# Patient Record
Sex: Female | Born: 1960 | Race: Black or African American | Hispanic: No | Marital: Single | Smoking: Never smoker
Health system: Southern US, Community
[De-identification: ages and names within clinical notes are randomized; demographics above are authoritative.]

## PROBLEM LIST (undated history)

## (undated) DIAGNOSIS — F259 Schizoaffective disorder, unspecified: Secondary | ICD-10-CM

## (undated) DIAGNOSIS — R2689 Other abnormalities of gait and mobility: Secondary | ICD-10-CM

## (undated) DIAGNOSIS — R413 Other amnesia: Secondary | ICD-10-CM

## (undated) DIAGNOSIS — R2681 Unsteadiness on feet: Secondary | ICD-10-CM

## (undated) DIAGNOSIS — U071 COVID-19: Secondary | ICD-10-CM

## (undated) DIAGNOSIS — J45909 Unspecified asthma, uncomplicated: Secondary | ICD-10-CM

## (undated) DIAGNOSIS — N289 Disorder of kidney and ureter, unspecified: Secondary | ICD-10-CM

## (undated) DIAGNOSIS — E119 Type 2 diabetes mellitus without complications: Secondary | ICD-10-CM

## (undated) DIAGNOSIS — E43 Unspecified severe protein-calorie malnutrition: Secondary | ICD-10-CM

## (undated) DIAGNOSIS — I6990 Unspecified sequelae of unspecified cerebrovascular disease: Secondary | ICD-10-CM

## (undated) DIAGNOSIS — G9341 Metabolic encephalopathy: Secondary | ICD-10-CM

## (undated) DIAGNOSIS — R41841 Cognitive communication deficit: Secondary | ICD-10-CM

## (undated) DIAGNOSIS — F329 Major depressive disorder, single episode, unspecified: Secondary | ICD-10-CM

## (undated) DIAGNOSIS — M6281 Muscle weakness (generalized): Secondary | ICD-10-CM

## (undated) DIAGNOSIS — R1312 Dysphagia, oropharyngeal phase: Secondary | ICD-10-CM

## (undated) DIAGNOSIS — R569 Unspecified convulsions: Secondary | ICD-10-CM

## (undated) DIAGNOSIS — I10 Essential (primary) hypertension: Secondary | ICD-10-CM

---

## 1998-03-12 ENCOUNTER — Encounter: Admission: RE | Admit: 1998-03-12 | Discharge: 1998-03-12 | Payer: Self-pay | Admitting: Obstetrics & Gynecology

## 1998-03-12 ENCOUNTER — Other Ambulatory Visit: Admission: RE | Admit: 1998-03-12 | Discharge: 1998-03-12 | Payer: Self-pay | Admitting: Obstetrics & Gynecology

## 1998-05-20 ENCOUNTER — Ambulatory Visit (HOSPITAL_COMMUNITY): Admission: RE | Admit: 1998-05-20 | Discharge: 1998-05-20 | Payer: Self-pay | Admitting: Family Medicine

## 1999-05-31 ENCOUNTER — Encounter: Payer: Self-pay | Admitting: Family Medicine

## 1999-05-31 ENCOUNTER — Ambulatory Visit (HOSPITAL_COMMUNITY): Admission: RE | Admit: 1999-05-31 | Discharge: 1999-05-31 | Payer: Self-pay | Admitting: Family Medicine

## 1999-06-06 ENCOUNTER — Encounter: Admission: RE | Admit: 1999-06-06 | Discharge: 1999-06-23 | Payer: Self-pay | Admitting: Family Medicine

## 1999-08-24 ENCOUNTER — Other Ambulatory Visit: Admission: RE | Admit: 1999-08-24 | Discharge: 1999-08-24 | Payer: Self-pay | Admitting: Family Medicine

## 1999-10-09 ENCOUNTER — Emergency Department (HOSPITAL_COMMUNITY): Admission: EM | Admit: 1999-10-09 | Discharge: 1999-10-09 | Payer: Self-pay | Admitting: Emergency Medicine

## 2002-09-11 ENCOUNTER — Other Ambulatory Visit: Admission: RE | Admit: 2002-09-11 | Discharge: 2002-09-11 | Payer: Self-pay | Admitting: Family Medicine

## 2002-11-06 ENCOUNTER — Ambulatory Visit (HOSPITAL_COMMUNITY): Admission: RE | Admit: 2002-11-06 | Discharge: 2002-11-06 | Payer: Self-pay | Admitting: Obstetrics & Gynecology

## 2002-11-19 ENCOUNTER — Encounter: Payer: Self-pay | Admitting: Family Medicine

## 2002-11-19 ENCOUNTER — Encounter: Admission: RE | Admit: 2002-11-19 | Discharge: 2002-11-19 | Payer: Self-pay | Admitting: Family Medicine

## 2003-03-10 ENCOUNTER — Emergency Department (HOSPITAL_COMMUNITY): Admission: AD | Admit: 2003-03-10 | Discharge: 2003-03-10 | Payer: Self-pay | Admitting: Emergency Medicine

## 2003-05-17 ENCOUNTER — Emergency Department (HOSPITAL_COMMUNITY): Admission: EM | Admit: 2003-05-17 | Discharge: 2003-05-17 | Payer: Self-pay | Admitting: Emergency Medicine

## 2004-10-14 ENCOUNTER — Emergency Department (HOSPITAL_COMMUNITY): Admission: EM | Admit: 2004-10-14 | Discharge: 2004-10-14 | Payer: Self-pay | Admitting: Emergency Medicine

## 2006-05-11 ENCOUNTER — Emergency Department (HOSPITAL_COMMUNITY): Admission: EM | Admit: 2006-05-11 | Discharge: 2006-05-11 | Payer: Self-pay | Admitting: *Deleted

## 2006-09-15 ENCOUNTER — Encounter: Admission: RE | Admit: 2006-09-15 | Discharge: 2006-09-15 | Payer: Self-pay | Admitting: Family Medicine

## 2007-08-07 ENCOUNTER — Observation Stay (HOSPITAL_COMMUNITY): Admission: EM | Admit: 2007-08-07 | Discharge: 2007-08-08 | Payer: Self-pay | Admitting: Emergency Medicine

## 2010-06-16 ENCOUNTER — Encounter (INDEPENDENT_AMBULATORY_CARE_PROVIDER_SITE_OTHER): Payer: Self-pay | Admitting: *Deleted

## 2010-06-16 ENCOUNTER — Ambulatory Visit: Payer: Self-pay | Admitting: Internal Medicine

## 2010-06-16 LAB — CONVERTED CEMR LAB
ALT: 26 units/L (ref 0–35)
AST: 35 units/L (ref 0–37)
Albumin: 4.2 g/dL (ref 3.5–5.2)
Alkaline Phosphatase: 84 units/L (ref 39–117)
BUN: 14 mg/dL (ref 6–23)
Basophils Absolute: 0 10*3/uL (ref 0.0–0.1)
Basophils Relative: 0 % (ref 0–1)
CO2: 28 meq/L (ref 19–32)
Calcium: 9 mg/dL (ref 8.4–10.5)
Chloride: 104 meq/L (ref 96–112)
Cholesterol: 207 mg/dL — ABNORMAL HIGH (ref 0–200)
Creatinine, Ser: 0.99 mg/dL (ref 0.40–1.20)
Eosinophils Absolute: 0.3 10*3/uL (ref 0.0–0.7)
Eosinophils Relative: 4 % (ref 0–5)
Glucose, Bld: 116 mg/dL — ABNORMAL HIGH (ref 70–99)
HCT: 40.8 % (ref 36.0–46.0)
HDL: 40 mg/dL (ref >39)
Hemoglobin: 13.5 g/dL (ref 12.0–15.0)
LDL Cholesterol: 110 mg/dL — ABNORMAL HIGH (ref 0–99)
Lymphocytes Relative: 32 % (ref 12–46)
Lymphs Abs: 2.4 10*3/uL (ref 0.7–4.0)
MCHC: 33.1 g/dL (ref 30.0–36.0)
MCV: 90.1 fL (ref 78.0–100.0)
Monocytes Absolute: 0.4 10*3/uL (ref 0.1–1.0)
Monocytes Relative: 6 % (ref 3–12)
Neutro Abs: 4.2 10*3/uL (ref 1.7–7.7)
Neutrophils Relative %: 58 % (ref 43–77)
Platelets: 302 10*3/uL (ref 150–400)
Potassium: 3.9 meq/L (ref 3.5–5.3)
RBC: 4.53 M/uL (ref 3.87–5.11)
RDW: 13.8 % (ref 11.5–15.5)
Sodium: 140 meq/L (ref 135–145)
TSH: 1.452 microintl units/mL (ref 0.350–4.500)
Total Bilirubin: 0.4 mg/dL (ref 0.3–1.2)
Total CHOL/HDL Ratio: 5.2
Total Protein: 6.9 g/dL (ref 6.0–8.3)
Triglycerides: 287 mg/dL — ABNORMAL HIGH (ref <150)
VLDL: 57 mg/dL — ABNORMAL HIGH (ref 0–40)
WBC: 7.3 10*3/uL (ref 4.0–10.5)

## 2011-01-10 NOTE — Discharge Summary (Signed)
Tamara [name], Tamara [name]               ACCOUNT NO.:  [id number]   MEDICAL RECORD NO.:  [id number]          PATIENT TYPE:  INP   LOCATION:  3709                         FACILITY:  [facility]   PHYSICIAN:  Herbie Saxon, MDDATE OF BIRTH:  1960-12-27   DATE OF ADMISSION:  08/07/2007  DATE OF DISCHARGE:  08/08/2007                               DISCHARGE SUMMARY   DISCHARGE DIAGNOSES:  1. Atypical chest pain likely secondary to coronary vasospasm      secondary to cocaine abuse.  2. Rhabdomyolysis, improving.  3. Bronchial asthma, stable.  4. Mild urinary tract infection.  5. Hematuria, resolving.  6. Hyperlipidemia.   RADIOLOGY:  The renal ultrasound of August 07, 2007, was normal.  CT  of the abdomen and pelvis was also normal.  Chest x-ray showed no acute  findings.   PROCEDURES:  Two-D echocardiogram of August 07, 2007, shows an  ejection fraction of 60%, normal findings.   HOSPITAL COURSE:  This 50 year old, African-American lady presented to  the emergency room complaining of severe left arm pain of two weeks  duration and retrosternal chest pain of one day duration.  The patient,  however, gives a history of chronic cocaine abuse for the last 15 years,  last used five days ago.  Total CPK was elevated on arrival.  The  patient was started on IV fluid hydration, started initially on Nitro  Paste, and continued on Aspartame.  Counseled extensively on the need to  cease the drug abuse and to enroll in a voluntary detox program.  The  patient is agreeable to this.  He has features of a mild urinary tract  infection for which cultures have been sent, but no growth at present.  The patient is asymptomatic and is very anxious to be discharged home.   DISCHARGE CONDITION:  Stable.   DIET:  Cardiac, no cholesterol, and low fat.   ACTIVITY:  To be increased daily as tolerated.   FOLLOWUP:  With her primary care physician, Dr. Loleta Chance, in one week.  The  primary care physician  will arrange for outpatient detox and administer  a psychiatric evaluation.   MEDICATIONS ON DISCHARGE:  1. Trazodone 100 mg daily.  2. Singulair 10 mg daily.  3. Albuterol two puffs q.6h p.r.n.  4. Lipitor 10 mg p.o. daily.  5. Nitroglycerin 0.4 mg sublingual p.r.n.  6. Aspirin 81 mg daily.  7. Ultracet one p.o. q.6h p.r.n.  8. Librium 25 mg p.o. b.i.d. for five days.   PHYSICAL EXAMINATION TODAY:  GENERAL:  She is a middle-aged lady not in  acute distress.  VITAL SIGNS:  The temperature is 98, pulse 60, respiratory rate 20,  blood pressure is 147/88.  HEENT:  Pupils equal and reactive to light and accommodation, head is  atraumatic and normocephalic, oropharynx and nasopharynx are clear.  NECK:  Supple, there is no submandibular lymph node, no elevated JVD or  carotid bruit, no thyromegaly.  CHEST:  Clinically clear.  CARDIAC:  Heart sounds 1 and 2 with regular rate and rhythm.  ABDOMEN:  Soft and nontender, no organomegaly, inguinal orifices are  patent.  NEURO:  She is alert and oriented x3, cranial nerves II-XII intact,  power is 5 globally, deep tendon reflexes 2+ globally, sensation is  normal.  EXTREMITIES:  No pedal edema, no skin rash, and no joint swelling.   AVAILABLE LABORATORY DATA:  The CPK is trending down, total CK is 347,  MB 529.7, troponin is 0.02.  The lipids show a total cholesterol of 178,  LDL fraction of 113, HDL 31, and triglycerides 161.  Thyroid function  test is normal at 1.2.  WBC is 8, hematocrit 35, platelet count is 307.  Chemistry shows normal phosphorus and normal calcium levels.   DISCHARGE TIME:  Less than 30 minutes.      Herbie Saxon, MD  Electronically Signed     MIO/MEDQ  D:  08/08/2007  T:  08/08/2007  Job:  [id number]

## 2011-01-10 NOTE — H&P (Signed)
Tamara [name], Tamara [name]               ACCOUNT NO.:  [id number]   MEDICAL RECORD NO.:  [id number]          PATIENT TYPE:  EMS   LOCATION:  MAJO                         FACILITY:  [facility]   PHYSICIAN:  Herbie Saxon, MDDATE OF BIRTH:  August 19, 1961   DATE OF ADMISSION:  08/07/2007  DATE OF DISCHARGE:                              HISTORY & PHYSICAL   CODE STATUS:  The patient is a full code.   PRESENTING COMPLAINT:  Left flank pain for two weeks.  Chest pain for  one day.   HISTORY OF PRESENTING COMPLAINT:  This is a 50 year old African-American  lady who was quite well until about two weeks ago when she started  noticing throbbing, dull, 6/10 left flank pain which is intermittent.  No known relieving or aggravating factors.  She denies any dysuria,  hematuria or frequency of urination.  The patient had been packing some  boxes this morning and she noticed retrosternal chest pain with some  lightheadedness, diaphoresis and shortness of breath which has improved  after aspirin and oxygen in the emergency room.  She gives a positive  history of cocaine use for the last 15 years.  She last used about five  days ago.  She does drink alcohol, two to three beers, every three days.  She denies tobacco abuse.  There is a family history of heart attack.  Her mother had hypertension and died of a massive heart attack.  Her  mother had bronchial asthma.  Her father had a CVA and hypertension.  The patient has never been intubated for asthma before.  She has  actually been placed on steroids at this time.   PAST SURGICAL HISTORY:  Bilateral tubal ligation.   SOCIAL HISTORY:  As dictated above.  She is single.  She has one child.   FAMILY HISTORY:  As dictated earlier.   PAST MEDICAL HISTORY:  1. Bronchial asthma.  2. Hypercholesterolemia.   ALLERGIES:  No known drug allergies.   MEDICATIONS:  Doses not known.  Albuterol, Singulair and Zocor.   REVIEW OF SYSTEMS:  Twelve systems  reviewed.  Pertinent positives as  above.   PHYSICAL EXAMINATION:  GENERAL APPEARANCE:  She is a middle-aged lady  not in acute distress.  VITAL SIGNS:  Temperature is 98.  Pulse is 69.  Respiratory rate is 16.  Blood pressure 123/76.  HEENT:  Pupils are equal and reactive to light and accommodation.  Oropharynx and nasopharynx are clear.  Head is atraumatic,  normocephalic.  Mucous membranes are moist.  NECK:  There is no carotid bruit.  No elevated JVD.  No thyromegaly.  No  submandibular lymphadenopathy.  CHEST:  Clinically clear.  HEART:  Heart sounds 1 and 2.  Regular rate and rhythm.  ABDOMEN:  Soft and nontender.  No organomegaly.  Inguinal orifices are  intact.  NEUROLOGIC:  She is alert and oriented x3.  Cranial nerves II-XII are  intact.  EXTREMITIES:  Deep tendon reflexes are 2+ globally.  Power is 5 in all  limbs.  Peripheral pulses are present.  No pedal edema.  No joint  swelling.  SKIN:  There is no skin rash.   LABORATORY DATA:  The available labs show that the urinalysis with  moderate hemoglobin, trace leukocyte esterase and WBC 0-2, urine RBC 3-  6, bacteria rare.  Chemistry shows a sodium of 137, potassium 3.8,  chloride 104, BUN 12, bicarbonate is 26.  Hematocrit is 29, hemoglobin  is 13.  CK-MB is 17.3, troponin 0.05, index 2.6.  The D-dimer is 0.34.  EKG shows sinus bradycardia at 58 per minute with anterior T-wave  inversion (this is mild).   ASSESSMENT:  1. Atypical chest pain.  Rule out acute coronary syndrome.  Rule out      coronary vasospasm secondary to cocaine abuse.  2. Rhabdomyolysis.  3. Bronchial asthma.  4. Mild urinary tract infection.  5. Hematuria.  6. Hyperlipidemia.   PLAN:  The patient will be admitted to a telemetry bed.  We will monitor  serial cardiac enzymes and EKG.  We will place her on nitro paste one-  half inch every 6 hours.  We will continue the simvastatin 40 mg  nightly, morphine 2 mg IV every 8 hours p.r.n., clonidine  0.1 mg p.o.  every 8 hours p.r.n. if blood pressure is greater than 160/110.  We will  consider cardiology evaluation after reviewing the 2-D echocardiogram.  We will continue Singulair 10 mg p.o. daily.  We will start her on  Rocephin 1 gm IV daily.  We will obtain a renal ultrasound to rule out  pyelonephritis.  We will give her Lovenox 40 mg subcutaneously daily,  Phenergan 12.5 mg IV every 8 hours p.r.n. and DuoNeb every 4-6 hours  p.r.n.  We will start her on IV fluids with normal saline at 100 mL an  hour.  We will get a thyroid function test, lipids, coagulation  parameters and liver function tests.  She is to be on O2 at 2 L nasal  canula p.r.n.  Diet will be heart healthy.  Activity will be bedrest.  Counseled on need for alcohol and cocaine abuse and need for detox as  outpatient.  We will put her on Xanax 0.5 mg p.o. b.i.d.      Herbie Saxon, MD  Electronically Signed     MIO/MEDQ  D:  08/07/2007  T:  08/08/2007  Job:  [id number]

## 2011-01-13 NOTE — Op Note (Signed)
[name], Tamara ANN                       ACCOUNT NO.:  [id number]   MEDICAL RECORD NO.:  [id number]                   PATIENT TYPE:  AMB   LOCATION:  SDC                                  FACILITY:  [facility]   PHYSICIAN:  Roseanna Rainbow, M.D.         DATE OF BIRTH:  23-Jul-1961   DATE OF PROCEDURE:  11/06/2002  DATE OF DISCHARGE:                                 OPERATIVE REPORT   PREOPERATIVE DIAGNOSIS:  Desires permanent sterilization.   POSTOPERATIVE DIAGNOSIS:  Desires permanent sterilization.   PROCEDURE:  Laparoscopic tubal ligation with bipolar cautery.   SURGEON:  Roseanna Rainbow, M.D.   ANESTHESIA:  General endotracheal.   COMPLICATIONS:  None.   ESTIMATED BLOOD LOSS:  Less than 25 cc.   FLUIDS AND URINE OUTPUT:  As per anesthesiology.  Urine output 100 cc clear  urine at the beginning of the procedure.   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  The  tubes and ovaries appeared normal.   TECHNIQUE:  The patient was taken to the operating room, where a general  anesthesia was obtained without difficulty.  The patient was placed in  dorsal lithotomy position and prepped and draped in the usual sterile  fashion.  A bivalve speculum was then placed in the patient's vagina, and  the anterior lip of the cervix was grasped with a single-toothed tenaculum.  A Hulka manipulator was then inserted into the uterus and secured to the  anterior lip of the cervix, as a means to manipulate the uterus.  The  speculum was removed from the vagina.   Attention was then turned to the patient's abdomen, where a 10 mm skin  incision was made in the umbilical fold.  A Veress needle was carefully  introduced into the peritoneal cavity at a 45-degree angle, while tenting  the abdominal wall.  Intraperitoneal placement is confirmed by the use of a  water-filled syringe and a drop in the intraabdominal pressure with  insufflation of the CO2 gas.  The  trocar and sleeve were then advanced  without difficulty into the abdomen, where intraabdominal placement was  confirmed by the laparoscope.   A survey of the patient's pelvis and abdomen revealed the above findings.  The bipolar cautery apparatus was then advanced through the operative port  of the laparoscope.  The left fallopian tube was identified and followed out  to the fimbriated end.  The mid-isthmic area of the tube was then cauterized  contiguously.  With each application the O meter was noted to go to zero.  There was no bleeding noted in the mesosalpinx.  The right fallopian tube  was then manipulated in a similar fashion.  The instruments were then  removed from the patient's abdomen, and the incision repaired with 3-0  Vicryl and Dermabond.  The Hulka manipulator was then removed from the  vagina, with no bleeding noted from the cervix.   The  patient tolerated the procedure well.  Sponge, lap, needle and  instrument counts were correct x2.  The patient was taken to the PACU in  stable condition.                                               Roseanna Rainbow, M.D.    Judee Clara  D:  11/06/2002  T:  11/06/2002  Job:  [id number]

## 2011-06-05 LAB — PROTIME-INR
INR: 1
Prothrombin Time: 13.3

## 2011-06-05 LAB — LIPID PANEL
Cholesterol: 178
HDL: 31 — ABNORMAL LOW
LDL Cholesterol: 113 — ABNORMAL HIGH
Total CHOL/HDL Ratio: 5.7
Triglycerides: 169 — ABNORMAL HIGH
VLDL: 34

## 2011-06-05 LAB — URINALYSIS, ROUTINE W REFLEX MICROSCOPIC
Bilirubin Urine: NEGATIVE
Glucose, UA: NEGATIVE
Ketones, ur: NEGATIVE
Nitrite: NEGATIVE
Protein, ur: NEGATIVE
Specific Gravity, Urine: 1.025
Urobilinogen, UA: 1
pH: 6.5

## 2011-06-05 LAB — I-STAT 8, (EC8 V) (CONVERTED LAB)
Acid-Base Excess: 1
BUN: 12
Bicarbonate: 26.3 — ABNORMAL HIGH
Chloride: 104
Glucose, Bld: 87
HCT: 39
Hemoglobin: 13.3
Operator id: 198171
Potassium: 3.8
Sodium: 137
TCO2: 28
pCO2, Ven: 45.1
pH, Ven: 7.373 — ABNORMAL HIGH

## 2011-06-05 LAB — CK TOTAL AND CKMB (NOT AT ARMC)
CK, MB: 11.3 — ABNORMAL HIGH
CK, MB: 19.2 — ABNORMAL HIGH
CK, MB: 9.7 — ABNORMAL HIGH
Relative Index: 2.6 — ABNORMAL HIGH
Relative Index: 2.6 — ABNORMAL HIGH
Relative Index: 2.8 — ABNORMAL HIGH
Total CK: 347 — ABNORMAL HIGH
Total CK: 441 — ABNORMAL HIGH
Total CK: 730 — ABNORMAL HIGH

## 2011-06-05 LAB — POCT CARDIAC MARKERS
CKMB, poc: 17.3
CKMB, poc: 18.1
Myoglobin, poc: 347
Myoglobin, poc: 392
Operator id: 198171
Operator id: 198171
Troponin i, poc: <0.05
Troponin i, poc: <0.05

## 2011-06-05 LAB — PHOSPHORUS: Phosphorus: 4.3

## 2011-06-05 LAB — CBC
HCT: 34.9 — ABNORMAL LOW
Hemoglobin: 11.7 — ABNORMAL LOW
MCHC: 33.5
MCV: 86
Platelets: 307
RBC: 4.05
RDW: 14.6
WBC: 8.7

## 2011-06-05 LAB — B-NATRIURETIC PEPTIDE (CONVERTED LAB): Pro B Natriuretic peptide (BNP): 31

## 2011-06-05 LAB — URINE MICROSCOPIC-ADD ON

## 2011-06-05 LAB — CALCIUM: Calcium: 8.5

## 2011-06-05 LAB — MAGNESIUM: Magnesium: 2.1

## 2011-06-05 LAB — APTT: aPTT: 26

## 2011-06-05 LAB — HOMOCYSTEINE: Homocysteine: 12.3

## 2011-06-05 LAB — TROPONIN I
Troponin I: 0.02
Troponin I: 0.04

## 2011-06-05 LAB — TSH: TSH: 1.26

## 2011-06-05 LAB — D-DIMER, QUANTITATIVE: D-Dimer, Quant: 0.34

## 2011-10-24 ENCOUNTER — Other Ambulatory Visit: Payer: Self-pay | Admitting: Nephrology

## 2011-10-24 DIAGNOSIS — R748 Abnormal levels of other serum enzymes: Secondary | ICD-10-CM

## 2011-10-26 ENCOUNTER — Other Ambulatory Visit: Payer: Self-pay

## 2011-11-15 ENCOUNTER — Other Ambulatory Visit: Payer: Self-pay

## 2012-10-04 ENCOUNTER — Emergency Department (INDEPENDENT_AMBULATORY_CARE_PROVIDER_SITE_OTHER)
Admission: EM | Admit: 2012-10-04 | Discharge: 2012-10-04 | Disposition: A | Discharge status: Home / Self Care | Payer: Self-pay | Source: Home / Self Care | Attending: Family Medicine | Admitting: Family Medicine

## 2012-10-04 ENCOUNTER — Encounter (HOSPITAL_COMMUNITY): Payer: Self-pay

## 2012-10-04 DIAGNOSIS — E785 Hyperlipidemia, unspecified: Secondary | ICD-10-CM

## 2012-10-04 DIAGNOSIS — J309 Allergic rhinitis, unspecified: Secondary | ICD-10-CM

## 2012-10-04 DIAGNOSIS — J45909 Unspecified asthma, uncomplicated: Secondary | ICD-10-CM

## 2012-10-04 DIAGNOSIS — Z23 Encounter for immunization: Secondary | ICD-10-CM

## 2012-10-04 DIAGNOSIS — Z87448 Personal history of other diseases of urinary system: Secondary | ICD-10-CM

## 2012-10-04 LAB — COMPREHENSIVE METABOLIC PANEL
ALT: 26 U/L (ref 0–35)
AST: 28 U/L (ref 0–37)
Albumin: 4 g/dL (ref 3.5–5.2)
Alkaline Phosphatase: 99 U/L (ref 39–117)
BUN: 12 mg/dL (ref 6–23)
CO2: 30 mEq/L (ref 19–32)
Calcium: 10.2 mg/dL (ref 8.4–10.5)
Chloride: 100 mEq/L (ref 96–112)
Creatinine, Ser: 0.98 mg/dL (ref 0.50–1.10)
GFR calc Af Amer: 77 mL/min — ABNORMAL LOW (ref >90)
GFR calc non Af Amer: 66 mL/min — ABNORMAL LOW (ref >90)
Glucose, Bld: 83 mg/dL (ref 70–99)
Potassium: 4.1 mEq/L (ref 3.5–5.1)
Sodium: 139 mEq/L (ref 135–145)
Total Bilirubin: 0.2 mg/dL — ABNORMAL LOW (ref 0.3–1.2)
Total Protein: 8.2 g/dL (ref 6.0–8.3)

## 2012-10-04 LAB — LIPID PANEL
Cholesterol: 220 mg/dL — ABNORMAL HIGH (ref 0–200)
HDL: 33 mg/dL — ABNORMAL LOW (ref >39)
LDL Cholesterol: UNDETERMINED mg/dL (ref 0–99)
Total CHOL/HDL Ratio: 6.7 RATIO
Triglycerides: 522 mg/dL — ABNORMAL HIGH (ref <150)
VLDL: UNDETERMINED mg/dL (ref 0–40)

## 2012-10-04 LAB — CBC
HCT: 41.4 % (ref 36.0–46.0)
Hemoglobin: 14.9 g/dL (ref 12.0–15.0)
MCH: 31.2 pg (ref 26.0–34.0)
MCHC: 36 g/dL (ref 30.0–36.0)
MCV: 86.6 fL (ref 78.0–100.0)
Platelets: 291 10*3/uL (ref 150–400)
RBC: 4.78 MIL/uL (ref 3.87–5.11)
RDW: 12.5 % (ref 11.5–15.5)
WBC: 8.2 10*3/uL (ref 4.0–10.5)

## 2012-10-04 LAB — HEMOGLOBIN A1C
Hgb A1c MFr Bld: 6.9 % — ABNORMAL HIGH (ref <5.7)
Mean Plasma Glucose: 151 mg/dL — ABNORMAL HIGH (ref <117)

## 2012-10-04 MED ORDER — ALBUTEROL SULFATE HFA 108 (90 BASE) MCG/ACT IN AERS: 2.0000 | INHALATION_SPRAY | Freq: Four times a day (QID) | RESPIRATORY_TRACT | Status: AC | PRN

## 2012-10-04 MED ORDER — MONTELUKAST SODIUM 10 MG PO TABS: 10.0000 mg | ORAL_TABLET | Freq: Every day | ORAL | Status: DC

## 2012-10-04 MED ORDER — INFLUENZA VIRUS VACC SPLIT PF IM SUSP
0.5000 mL | Freq: Once | INTRAMUSCULAR | Status: AC
  Administered 2012-10-04: 0.5 mL via INTRAMUSCULAR

## 2012-10-04 NOTE — ED Provider Notes (Signed)
History   CSN: [id number]  Arrival date & time 10/04/12  1005   First MD Initiated Contact with Patient 10/04/12 1032     Chief Complaint  Patient presents with  . Asthma   HPI Pt lost her medicaid last year.  She was being treated for asthma.  Pt was using an albuterol.  Pt was taking singulair and was taking cholesterol medications. She had been taking lipitor.  Pt says that she was going to a kidney doctor.  Pt says that she was seen last year for a kidney doctor evaluation.  She does not know what the problem was with her kidneys.   She would like to have some blood work done today.    History reviewed. No pertinent past medical history.  History reviewed. No pertinent past surgical history.  family history  Hypertension Hyperlipidemia  History  Substance Use Topics  . Smoking status: Not on file  . Smokeless tobacco: Not on file  . Alcohol Use: Not on file    OB History    Grav Para Term Preterm Abortions TAB SAB Ect Mult Living                 Review of Systems  HENT: Negative.   Cardiovascular: Negative.   Gastrointestinal: Negative.   Musculoskeletal: Negative.   Neurological: Negative.   Hematological: Negative.   Psychiatric/Behavioral: Negative.   All other systems reviewed and are negative.   Allergies  Review of patient's allergies indicates no known allergies.  Home Medications  No current outpatient prescriptions on file.  BP 129/85  Pulse 70  Temp 98.1 F (36.7 C) (Oral)  Resp 16  SpO2 97%  Physical Exam  Nursing note and vitals reviewed. Constitutional: She is oriented to person, place, and time. She appears well-developed and well-nourished. No distress.  HENT:  Head: Normocephalic and atraumatic.  Right Ear: External ear normal.  Left Ear: External ear normal.  Eyes: EOM are normal. Pupils are equal, round, and reactive to light.  Neck: Normal range of motion. Neck supple.  Cardiovascular: Normal rate, regular rhythm and normal heart  sounds.   Pulmonary/Chest: Effort normal and breath sounds normal.  Abdominal: Soft. Bowel sounds are normal.  Musculoskeletal: Normal range of motion.  Neurological: She is alert and oriented to person, place, and time.  Skin: Skin is warm and dry.  Psychiatric: She has a normal mood and affect. Her behavior is normal. Judgment and thought content normal.    ED Course  Procedures (including critical care time)  Labs Reviewed - No data to display No results found.  No diagnosis found.  MDM  IMPRESSION  Asthma  Allergic Rhinitis  History of hyperlipidemia  RECOMMENDATIONS / PLAN Refilled albuterol and singulair Check CBC, CMP, lipid panel, A1c   FOLLOW UP 3 months   The patient was given clear instructions to go to ER or return to medical center if symptoms don't improve, worsen or new problems develop.  The patient verbalized understanding.  The patient was told to call to get lab results if they haven't heard anything in the next week.           Cleora Fleet, MD 10/04/12 1057

## 2012-10-04 NOTE — ED Notes (Signed)
Patient states has a history of asthma and kidney issues. Need medication refill

## 2012-10-05 NOTE — Progress Notes (Signed)
Quick Note:  Please notify patient that her kidney function test came back OK. Her blood sugar is elevated and she does have diabetes. We need to start treating her diabetes. I recommend that we start Metformin ER 500 mg tabs. Take 1 po with supper daily for 1 week, then take 1 po BIDAC, #60, RFx2. Her cholesterol also came back elevated. I recommend that she start pravastatin 20 mg - take 1 po daily, #30, RF x 2.   Rodney Langton, MD, CDE, FAAFP [facility] [place], [place]   ______

## 2012-10-11 ENCOUNTER — Telehealth (HOSPITAL_COMMUNITY): Payer: Self-pay

## 2012-10-11 MED ORDER — PRAVASTATIN SODIUM 20 MG PO TABS: 20.0000 mg | ORAL_TABLET | Freq: Every day | ORAL | Status: DC

## 2012-10-11 MED ORDER — METFORMIN HCL ER 500 MG PO TB24: ORAL_TABLET | ORAL | Status: DC

## 2013-01-14 ENCOUNTER — Ambulatory Visit: Payer: Self-pay | Attending: Internal Medicine | Admitting: Internal Medicine

## 2013-01-14 VITALS — BP 148/72 | HR 68 | Temp 98.9°F | Resp 18 | Wt 190.0 lb

## 2013-01-14 DIAGNOSIS — J45901 Unspecified asthma with (acute) exacerbation: Secondary | ICD-10-CM

## 2013-01-14 DIAGNOSIS — E119 Type 2 diabetes mellitus without complications: Secondary | ICD-10-CM | POA: Insufficient documentation

## 2013-01-14 DIAGNOSIS — E785 Hyperlipidemia, unspecified: Secondary | ICD-10-CM

## 2013-01-14 MED ORDER — LORATADINE 10 MG PO TABS: 10.0000 mg | ORAL_TABLET | Freq: Every day | ORAL | Status: DC

## 2013-01-14 MED ORDER — ALBUTEROL SULFATE (2.5 MG/3ML) 0.083% IN NEBU
2.5000 mg | INHALATION_SOLUTION | Freq: Once | RESPIRATORY_TRACT | Status: AC
  Administered 2013-01-14: 2.5 mg via RESPIRATORY_TRACT

## 2013-01-14 MED ORDER — SODIUM CHLORIDE 0.9 % IV SOLN: 60.0000 mg | Freq: Once | INTRAVENOUS | Status: DC

## 2013-01-14 MED ORDER — DOXYCYCLINE HYCLATE 100 MG PO TABS: 100.0000 mg | ORAL_TABLET | Freq: Two times a day (BID) | ORAL | Status: DC

## 2013-01-14 MED ORDER — METHYLPREDNISOLONE SODIUM SUCC 125 MG IJ SOLR
125.0000 mg | Freq: Once | INTRAMUSCULAR | Status: AC
  Administered 2013-01-14: 125 mg via INTRAMUSCULAR

## 2013-01-14 MED ORDER — FLUTICASONE PROPIONATE 50 MCG/ACT NA SUSP: 2.0000 | Freq: Every day | NASAL | Status: DC

## 2013-01-14 MED ORDER — PREDNISONE 10 MG PO TABS: ORAL_TABLET | ORAL | Status: DC

## 2013-01-14 MED ORDER — OXYMETAZOLINE HCL 0.05 % NA SOLN: 2.0000 | Freq: Two times a day (BID) | NASAL | Status: DC

## 2013-01-14 NOTE — Progress Notes (Signed)
Patient has a history of asthma Complains of cough congestion runny nose past week Has also stated she has increased urination as well

## 2013-01-14 NOTE — Progress Notes (Signed)
Patient ID: Tamara [name], female   DOB: 13-Jun-1961, 52 y.o.   MRN: [id number] Patient Demographics  Tamara [name], is a 52 y.o. female  WUJ:[id number]  [id number]  DOB - 12/30/1960  Chief Complaint  Patient presents with  . URI        Subjective:   Tamara [name] today is here for a follow up visit.For the past one week she has a productive cough with whitish yellow sputum, She also has been having some SOB and has been wheezing as well.She also claims to have increased lacrimation and runny nose. No fever. Claims she is using her Albuterol inhaler much more frequently than usual. No fever. During my evaluation, she is not SOB and is speaking in full sentences.She appears comfortable.  Patient has No headache, No chest pain, No abdominal pain - No Nausea, No new weakness tingling or numbness  Objective:    Filed Vitals:   01/14/13 1605  BP: 148/72  Pulse: 68  Temp: 98.9 F (37.2 C)  Resp: 18  Weight: 190 lb (86.183 kg)  SpO2: 96%     ALLERGIES:  No Known Allergies  PAST MEDICAL HISTORY: No past medical history on file.  MEDICATIONS AT HOME: Prior to Admission medications   Medication Sig Start Date End Date Taking? Authorizing Provider  albuterol (PROVENTIL HFA;VENTOLIN HFA) 108 (90 BASE) MCG/ACT inhaler Inhale 2 puffs into the lungs every 6 (six) hours as needed for wheezing. 10/04/12   Clanford Cyndie Mull, MD  doxycycline (VIBRA-TABS) 100 MG tablet Take 1 tablet (100 mg total) by mouth 2 (two) times daily. 01/14/13   Kaileigh Viswanathan Levora Dredge, MD  fluticasone (FLONASE) 50 MCG/ACT nasal spray Place 2 sprays into the nose daily. 01/14/13   Elfrida Pixley Levora Dredge, MD  loratadine (CLARITIN) 10 MG tablet Take 1 tablet (10 mg total) by mouth daily. 01/14/13   Avina Eberle Levora Dredge, MD  metFORMIN (GLUCOPHAGE XR) 500 MG 24 hr tablet Take 1 tab with supper daily for 1 week and then 1 tab twice daily 10/11/12   Lars Mage, MD  montelukast (SINGULAIR) 10 MG tablet Take 1 tablet (10 mg total) by  mouth at bedtime. 10/04/12   Clanford Cyndie Mull, MD  oxymetazoline (AFRIN NASAL SPRAY) 0.05 % nasal spray Place 2 sprays into the nose 2 (two) times daily. Take for 3 days and then stop 01/14/13   Maretta Bees, MD  pravastatin (PRAVACHOL) 20 MG tablet Take 1 tablet (20 mg total) by mouth daily. 10/11/12   Lars Mage, MD  predniSONE (DELTASONE) 10 MG tablet Take 4 tablets daily for 2 days, then, Take 3 tablets daily for 2 days, then, Take 2 tablets daily for 2 days, then,  Take 1 tablet daily for 1 day and then stop 01/14/13   Maretta Bees, MD     Exam  General appearance :Awake, alert, not in any distress. Speech Clear. Not toxic Looking HEENT: Atraumatic and Normocephalic, pupils equally reactive to light and accomodation Neck: supple, no JVD. No cervical lymphadenopathy.  Chest:Good air entry bilaterally, very few scattered rhonchi CVS: S1 S2 regular, no murmurs.  Abdomen: Bowel sounds present, Non tender and not distended with no gaurding, rigidity or rebound. Extremities: B/L Lower Ext shows no edema, both legs are warm to touch Neurology: Awake alert, and oriented X 3, CN II-XII intact, Non focal Skin:No Rash Wounds:N/A    Data Review   CBC No results found for this basename: WBC, HGB, HCT, PLT, MCV, MCH, MCHC, RDW, NEUTRABS, LYMPHSABS, MONOABS, EOSABS, BASOSABS,  BANDABS, BANDSABD,  in the last 168 hours  Chemistries   No results found for this basename: NA, K, CL, CO2, GLUCOSE, BUN, CREATININE, GFRCGP, CALCIUM, MG, AST, ALT, ALKPHOS, BILITOT,  in the last 168 hours ------------------------------------------------------------------------------------------------------------------ No results found for this basename: HGBA1C,  in the last 72 hours ------------------------------------------------------------------------------------------------------------------ No results found for this basename: CHOL, HDL, LDLCALC, TRIG, CHOLHDL, LDLDIRECT,  in the last 72  hours ------------------------------------------------------------------------------------------------------------------ No results found for this basename: TSH, T4TOTAL, FREET3, T3FREE, THYROIDAB,  in the last 72 hours ------------------------------------------------------------------------------------------------------------------ No results found for this basename: VITAMINB12, FOLATE, FERRITIN, TIBC, IRON, RETICCTPCT,  in the last 72 hours  Coagulation profile  No results found for this basename: INR, PROTIME,  in the last 168 hours    Assessment & Plan   Mild exacerbation of Asthma -2/2 to either a URI or an allergies -clinically stable-not using accessory muscles, easily speaking in full sentences and is very comfortable. Only scattered rhonchi heared, with very good air entry bilaterally -will give one dose of solumedrol and one albuterol nebs here, following which will place on Claritin, Prednisone taper, nasal decongestant and nasal steroid. -Follow up in 1 week, she should follow up sooner if symptoms not getting better, or even seek ED care if symptoms worsen rapidly. This was explained multiple times, she claims understanding  DM -c/w Metformin  Dyslipidemia -c/w Statins  Follow up in 1 week, or sooner prn

## 2013-01-21 ENCOUNTER — Ambulatory Visit (HOSPITAL_BASED_OUTPATIENT_CLINIC_OR_DEPARTMENT_OTHER): Payer: Self-pay

## 2018-11-03 ENCOUNTER — Other Ambulatory Visit: Payer: Self-pay

## 2018-11-03 ENCOUNTER — Observation Stay (HOSPITAL_COMMUNITY)
Admission: EM | Admit: 2018-11-03 | Discharge: 2018-11-04 | Disposition: A | Discharge status: Home / Self Care | Payer: Self-pay | Attending: Family Medicine | Admitting: Family Medicine

## 2018-11-03 ENCOUNTER — Emergency Department (HOSPITAL_COMMUNITY): Payer: Self-pay

## 2018-11-03 DIAGNOSIS — Z79899 Other long term (current) drug therapy: Secondary | ICD-10-CM | POA: Insufficient documentation

## 2018-11-03 DIAGNOSIS — N189 Chronic kidney disease, unspecified: Secondary | ICD-10-CM | POA: Insufficient documentation

## 2018-11-03 DIAGNOSIS — I129 Hypertensive chronic kidney disease with stage 1 through stage 4 chronic kidney disease, or unspecified chronic kidney disease: Secondary | ICD-10-CM | POA: Insufficient documentation

## 2018-11-03 DIAGNOSIS — E1122 Type 2 diabetes mellitus with diabetic chronic kidney disease: Secondary | ICD-10-CM | POA: Insufficient documentation

## 2018-11-03 DIAGNOSIS — E785 Hyperlipidemia, unspecified: Secondary | ICD-10-CM | POA: Insufficient documentation

## 2018-11-03 DIAGNOSIS — Z7984 Long term (current) use of oral hypoglycemic drugs: Secondary | ICD-10-CM | POA: Insufficient documentation

## 2018-11-03 DIAGNOSIS — R0789 Other chest pain: Principal | ICD-10-CM | POA: Insufficient documentation

## 2018-11-03 DIAGNOSIS — N179 Acute kidney failure, unspecified: Secondary | ICD-10-CM | POA: Insufficient documentation

## 2018-11-03 DIAGNOSIS — J45909 Unspecified asthma, uncomplicated: Secondary | ICD-10-CM | POA: Insufficient documentation

## 2018-11-03 DIAGNOSIS — R079 Chest pain, unspecified: Secondary | ICD-10-CM | POA: Diagnosis present

## 2018-11-03 LAB — I-STAT TROPONIN, ED: Troponin i, poc: 0 ng/mL (ref 0.00–0.08)

## 2018-11-03 LAB — CBC
HCT: 36.7 % (ref 36.0–46.0)
Hemoglobin: 12.3 g/dL (ref 12.0–15.0)
MCH: 30.5 pg (ref 26.0–34.0)
MCHC: 33.5 g/dL (ref 30.0–36.0)
MCV: 91.1 fL (ref 80.0–100.0)
Platelets: 339 10*3/uL (ref 150–400)
RBC: 4.03 MIL/uL (ref 3.87–5.11)
RDW: 12.3 % (ref 11.5–15.5)
WBC: 9.1 10*3/uL (ref 4.0–10.5)
nRBC: 0 % (ref 0.0–0.2)

## 2018-11-03 LAB — HEMOGLOBIN A1C
Hgb A1c MFr Bld: 6.7 % — ABNORMAL HIGH (ref 4.8–5.6)
Mean Plasma Glucose: 145.59 mg/dL

## 2018-11-03 LAB — BASIC METABOLIC PANEL
Anion gap: 7 (ref 5–15)
BUN: 13 mg/dL (ref 6–20)
CO2: 27 mmol/L (ref 22–32)
Calcium: 9.5 mg/dL (ref 8.9–10.3)
Chloride: 105 mmol/L (ref 98–111)
Creatinine, Ser: 1.45 mg/dL — ABNORMAL HIGH (ref 0.44–1.00)
GFR calc Af Amer: 47 mL/min — ABNORMAL LOW (ref >60)
GFR calc non Af Amer: 40 mL/min — ABNORMAL LOW (ref >60)
Glucose, Bld: 147 mg/dL — ABNORMAL HIGH (ref 70–99)
Potassium: 4.1 mmol/L (ref 3.5–5.1)
Sodium: 139 mmol/L (ref 135–145)

## 2018-11-03 LAB — TROPONIN I: Troponin I: <0.03 ng/mL (ref <0.03)

## 2018-11-03 LAB — GLUCOSE, CAPILLARY: Glucose-Capillary: 145 mg/dL — ABNORMAL HIGH (ref 70–99)

## 2018-11-03 LAB — TSH: TSH: 1.961 u[IU]/mL (ref 0.350–4.500)

## 2018-11-03 MED ORDER — INSULIN ASPART 100 UNIT/ML ~~LOC~~ SOLN: 0.0000 [IU] | Freq: Every day | SUBCUTANEOUS | Status: DC

## 2018-11-03 MED ORDER — LACTATED RINGERS IV BOLUS
1000.0000 mL | Freq: Once | INTRAVENOUS | Status: AC
  Administered 2018-11-03: 1000 mL via INTRAVENOUS

## 2018-11-03 MED ORDER — ASPIRIN EC 81 MG PO TBEC
81.0000 mg | DELAYED_RELEASE_TABLET | Freq: Every day | ORAL | Status: DC
  Administered 2018-11-04: 81 mg via ORAL
  Filled 2018-11-03: qty 1

## 2018-11-03 MED ORDER — ACETAMINOPHEN 325 MG PO TABS: 650.0000 mg | ORAL_TABLET | ORAL | Status: DC | PRN

## 2018-11-03 MED ORDER — ASPIRIN 81 MG PO CHEW
324.0000 mg | CHEWABLE_TABLET | Freq: Once | ORAL | Status: AC
  Administered 2018-11-03: 324 mg via ORAL
  Filled 2018-11-03: qty 4

## 2018-11-03 MED ORDER — LORATADINE 10 MG PO TABS
10.0000 mg | ORAL_TABLET | Freq: Every day | ORAL | Status: DC
  Administered 2018-11-04: 10 mg via ORAL
  Filled 2018-11-03 (×2): qty 1

## 2018-11-03 MED ORDER — NITROGLYCERIN 0.4 MG SL SUBL: 0.4000 mg | SUBLINGUAL_TABLET | SUBLINGUAL | Status: DC | PRN

## 2018-11-03 MED ORDER — MONTELUKAST SODIUM 10 MG PO TABS
10.0000 mg | ORAL_TABLET | Freq: Every day | ORAL | Status: DC
  Administered 2018-11-03: 10 mg via ORAL
  Filled 2018-11-03: qty 1

## 2018-11-03 MED ORDER — INSULIN ASPART 100 UNIT/ML ~~LOC~~ SOLN
0.0000 [IU] | Freq: Three times a day (TID) | SUBCUTANEOUS | Status: DC
  Administered 2018-11-04: 2 [IU] via SUBCUTANEOUS

## 2018-11-03 MED ORDER — PRAVASTATIN SODIUM 10 MG PO TABS
20.0000 mg | ORAL_TABLET | Freq: Every day | ORAL | Status: DC
  Administered 2018-11-04: 20 mg via ORAL
  Filled 2018-11-03: qty 2

## 2018-11-03 MED ORDER — ASPIRIN 81 MG PO CHEW: 324.0000 mg | CHEWABLE_TABLET | ORAL | Status: DC

## 2018-11-03 MED ORDER — ASPIRIN 300 MG RE SUPP: 300.0000 mg | RECTAL | Status: DC

## 2018-11-03 MED ORDER — ENOXAPARIN SODIUM 40 MG/0.4ML ~~LOC~~ SOLN
40.0000 mg | SUBCUTANEOUS | Status: DC
  Administered 2018-11-03: 40 mg via SUBCUTANEOUS
  Filled 2018-11-03: qty 0.4

## 2018-11-03 NOTE — ED Notes (Addendum)
I-stat trop at 1257 (0.00)

## 2018-11-03 NOTE — ED Notes (Signed)
ED TO INPATIENT HANDOFF REPORT  ED Nurse Name and Phone #: Marcelino Duster RN [phone]  S Name/Age/Gender Tamara [name] 58 y.o. female Room/Bed: 022C/022C  Code Status   Code Status: Not on file  Home/SNF/Other Home Patient oriented to: self, place, time and situation Is this baseline? Yes  Triage Complete: Triage complete  Chief Complaint chest pain  Triage Note Pt arrives to ED from church with complaints of right sided chest pressure since 2 days ago. 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 People make my chest hurt like this". Pt given 324 asa en route, 1 nitro given, pt denies CP at this time. Pt placed in position of comfort with bed locked and lowered, call bell in reach.    Allergies No Known Allergies  Level of Care/Admitting Diagnosis ED Disposition    ED Disposition Condition Comment   Admit  Hospital Area: [facility] [[id number]]  Level of Care: Medical Telemetry [104]  Diagnosis: Chest pain [841324]  Admitting Physician: Nestor Ramp [[id number]]  Attending Physician: Denny Levy L [[id number]]  PT Class (Do Not Modify): Observation [104]  PT Acc Code (Do Not Modify): Observation [10022]       B Medical/Surgery History No past medical history on file. No past surgical history on file.   A IV Location/Drains/Wounds Patient Lines/Drains/Airways Status   Active Line/Drains/Airways    Name:   Placement date:   Placement time:   Site:   Days:   Peripheral IV 11/03/18 Left Hand   11/03/18    1500    Hand   less than 1          Intake/Output Last 24 hours No intake or output data in the 24 hours ending 11/03/18 1813  Labs/Imaging Results for orders placed or performed during the hospital encounter of 11/03/18 (from the past 48 hour(s))  Basic metabolic panel     Status: Abnormal   Collection Time: 11/03/18  1:00  PM  Result Value Ref Range   Sodium 139 135 - 145 mmol/L   Potassium 4.1 3.5 - 5.1 mmol/L   Chloride 105 98 - 111 mmol/L   CO2 27 22 - 32 mmol/L   Glucose, Bld 147 (H) 70 - 99 mg/dL   BUN 13 6 - 20 mg/dL   Creatinine, Ser 4.01 (H) 0.44 - 1.00 mg/dL   Calcium 9.5 8.9 - 02.7 mg/dL   GFR calc non Af Amer 40 (L) >60 mL/min   GFR calc Af Amer 47 (L) >60 mL/min   Anion gap 7 5 - 15    Comment: Performed at [facility], [address], [place], [place] [address]  CBC     Status: None   Collection Time: 11/03/18  1:00 PM  Result Value Ref Range   WBC 9.1 4.0 - 10.5 K/uL   RBC 4.03 3.87 - 5.11 MIL/uL   Hemoglobin 12.3 12.0 - 15.0 g/dL   HCT 44.0 34.7 - 42.5 %   MCV 91.1 80.0 - 100.0 fL   MCH 30.5 26.0 - 34.0 pg   MCHC 33.5 30.0 - 36.0 g/dL   RDW 95.6 38.7 - 56.4 %   Platelets 339 150 - 400 K/uL   nRBC 0.0 0.0 - 0.2 %    Comment: Performed at [facility], [address], [place], [place] [address]  I-stat troponin, ED     Status: None   Collection Time: 11/03/18  4:26 PM  Result Value Ref Range   Troponin i, poc 0.00 0.00 - 0.08 ng/mL   Comment 3            Comment: Due to the release kinetics of cTnI, a negative result within the first hours of the onset of symptoms does not rule out myocardial infarction with certainty. If myocardial infarction is still suspected, repeat the test at appropriate intervals.    Dg Chest 2 View  Result Date: 11/03/2018 CLINICAL DATA:  Patient felt tightness in her chest while at church and is now feeling better, non-smoker no previous surgery to chest EXAM: CHEST - 2 VIEW COMPARISON:  08/07/2007 FINDINGS: Midline trachea. Normal heart size and mediastinal contours. No pleural effusion or pneumothorax. There is partial obscuration of the left heart border inferiorly on the frontal radiograph. This is not correlated on the lateral radiograph. New compared to 08/07/2007. IMPRESSION: Subtle increased density along the left heart border on the  frontal radiograph. No correlate on the lateral view. Although this may simply represent an area of volume loss and atelectasis, if symptoms persist or warrant, recommend radiographic follow-up at 5-7 days to exclude developing airspace disease. Electronically Signed   By: Jeronimo Greaves M.D.   On: 11/03/2018 14:07    Pending Labs Unresulted Labs (From admission, onward)    Start     Ordered   11/03/18 1555  Rapid urine drug screen (hospital performed)  ONCE - STAT,   R     11/03/18 1554   Signed and Held  HIV antibody (Routine Testing)  Once,   R     Signed and Held   Signed and Held  Troponin I - Now Then Q6H  Now then every 6 hours,   STAT     Signed and Held   Signed and Held  Hemoglobin A1c  Once,   R     Signed and Held   Signed and Held  TSH  Once,   R     Signed and Held   Signed and Armed forces training and education officer morning,   R     Signed and Held   Signed and Held  CBC  Tomorrow morning,   R     Signed and Held   Signed and Held  Lipid panel  Tomorrow morning,   R     Signed and Held          Vitals/Pain Today's Vitals   11/03/18 1600 11/03/18 1700 11/03/18 1719 11/03/18 1726  BP: 126/83 (!) 124/95    Pulse: 77 (!) 148    Resp: 14 16    Temp:      TempSrc:      SpO2: 100% (!) 80%    Weight:    78 kg  Height:    5' 7.5" (1.715 m)  PainSc:   0-No pain     Isolation Precautions No active isolations  Medications Medications  insulin aspart (novoLOG) injection 0-9 Units (has no administration in time range)  insulin aspart (novoLOG) injection 0-5 Units (has no administration in time range)  lactated ringers bolus 1,000 mL (1,000 mLs Intravenous New Bag/Given 11/03/18 1715)  aspirin chewable tablet 324 mg (324 mg Oral Given 11/03/18 1718)    Mobility walks Low fall risk   Focused Assessments Cardiac Assessment Handoff:  Cardiac Rhythm: Normal sinus rhythm Lab Results  Component Value Date  CKTOTAL 347 (H) 08/08/2007   CKMB 9.7 (H) 08/08/2007    TROPONINI 0.04        NO INDICATION OF MYOCARDIAL INJURY. 08/08/2007   Lab Results  Component Value Date   DDIMER  08/07/2007    0.34        AT THE INHOUSE ESTABLISHED CUTOFF VALUE OF 0.48 ug/mL FEU, THIS ASSAY HAS BEEN DOCUMENTED IN THE LITERATURE TO HAVE   Does the Patient currently have chest pain? No     R Recommendations: See Admitting Provider Note  Report given to:   Additional Notes:
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
## 2018-11-03 NOTE — ED Triage Notes (Signed)
Pt arrives to ED from church with complaints of right sided chest pressure since 2 days ago. 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 People make my chest hurt like this". Pt given 324 asa en route, 1 nitro given, pt denies CP at this time. Pt placed in position of comfort with bed locked and lowered, call bell in reach.

## 2018-11-03 NOTE — ED Notes (Signed)
Family at bedside. 
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
## 2018-11-03 NOTE — ED Provider Notes (Signed)
[facility] EMERGENCY DEPARTMENT Provider Note   CSN: [id number] Arrival date & time: 11/03/18  1248    History   Chief Complaint Chief Complaint  Patient presents with  . Chest Pain    HPI Tamara [name] is a 58 y.o. female.      Chest Pain  Pain location:  Substernal area Pain quality: pressure   Pain radiates to:  Does not radiate Pain severity:  Moderate Onset quality:  Gradual Duration:  1 hour Timing:  Constant Progression:  Resolved Chronicity:  Recurrent Context: at rest   Relieved by:  None tried Worsened by:  Nothing Ineffective treatments:  None tried Associated symptoms: no abdominal pain, no altered mental status, no anxiety, no back pain, no cough, no fever, no lower extremity edema, no palpitations, no shortness of breath and no vomiting   Risk factors: diabetes mellitus, high cholesterol and hypertension     No past medical history on file.  Patient Active Problem List   Diagnosis Date Noted  . Chest pain 11/03/2018  . Asthma with acute exacerbation 01/14/2013  . Diabetes (HCC) 01/14/2013  . Dyslipidemia 01/14/2013    No past surgical history on file.   OB History   No obstetric history on file.      Home Medications    Prior to Admission medications   Medication Sig Start Date End Date Taking? Authorizing Provider  albuterol (PROVENTIL HFA;VENTOLIN HFA) 108 (90 BASE) MCG/ACT inhaler Inhale 2 puffs into the lungs every 6 (six) hours as needed for wheezing. 10/04/12   Johnson, Clanford L, MD  doxycycline (VIBRA-TABS) 100 MG tablet Take 1 tablet (100 mg total) by mouth 2 (two) times daily. 01/14/13   Ghimire, Werner Lean, MD  fluticasone (FLONASE) 50 MCG/ACT nasal spray Place 2 sprays into the nose daily. 01/14/13   Ghimire, Werner Lean, MD  loratadine (CLARITIN) 10 MG tablet Take 1 tablet (10 mg total) by mouth daily. 01/14/13   Ghimire, Werner Lean, MD  metFORMIN (GLUCOPHAGE XR) 500 MG 24 hr tablet Take 1 tab with supper daily for  1 week and then 1 tab twice daily 10/11/12   Lars Mage, MD  montelukast (SINGULAIR) 10 MG tablet Take 1 tablet (10 mg total) by mouth at bedtime. 10/04/12   Johnson, Clanford L, MD  oxymetazoline (AFRIN NASAL SPRAY) 0.05 % nasal spray Place 2 sprays into the nose 2 (two) times daily. Take for 3 days and then stop 01/14/13   Maretta Bees, MD  pravastatin (PRAVACHOL) 20 MG tablet Take 1 tablet (20 mg total) by mouth daily. 10/11/12   Lars Mage, MD    Family History No family history on file.  Social History Social History   Tobacco Use  . Smoking status: Not on file  Substance Use Topics  . Alcohol use: Not on file  . Drug use: Not on file     Allergies   Patient has no known allergies.   Review of Systems Review of Systems  Constitutional: Negative for chills and fever.  HENT: Negative for ear pain and sore throat.   Eyes: Negative for pain and visual disturbance.  Respiratory: Negative for cough and shortness of breath.   Cardiovascular: Positive for chest pain. Negative for palpitations.  Gastrointestinal: Negative for abdominal pain and vomiting.  Genitourinary: Negative for dysuria and hematuria.  Musculoskeletal: Negative for arthralgias and back pain.  Skin: Negative for color change and rash.  Neurological: Negative for seizures and syncope.  All other systems reviewed and are  negative.    Physical Exam Updated Vital Signs BP (!) 124/95   Pulse (!) 148   Temp 97.8 F (36.6 C) (Oral)   Resp 16   Ht 5' 7.5" (1.715 m)   Wt 78 kg   SpO2 (!) 80%   BMI 26.54 kg/m   Physical Exam Vitals signs and nursing note reviewed.  Constitutional:      General: She is not in acute distress.    Appearance: She is well-developed.     Comments: Patient resting comfortably, no acute distress  HENT:     Head: Normocephalic and atraumatic.  Eyes:     Conjunctiva/sclera: Conjunctivae normal.  Neck:     Musculoskeletal: Neck supple.  Cardiovascular:     Rate and  Rhythm: Normal rate and regular rhythm.     Heart sounds: No murmur.  Pulmonary:     Effort: Pulmonary effort is normal. No respiratory distress.     Breath sounds: Normal breath sounds.  Abdominal:     Palpations: Abdomen is soft.     Tenderness: There is no abdominal tenderness.  Musculoskeletal: Normal range of motion.     Right lower leg: No edema.     Left lower leg: No edema.  Skin:    General: Skin is warm and dry.     Capillary Refill: Capillary refill takes less than 2 seconds.  Neurological:     General: No focal deficit present.     Mental Status: She is alert.  Psychiatric:        Mood and Affect: Mood normal.      ED Treatments / Results  Labs (all labs ordered are listed, but only abnormal results are displayed) Labs Reviewed  BASIC METABOLIC PANEL - Abnormal; Notable for the following components:      Result Value   Glucose, Bld 147 (*)    Creatinine, Ser 1.45 (*)    GFR calc non Af Amer 40 (*)    GFR calc Af Amer 47 (*)    All other components within normal limits  CBC  RAPID URINE DRUG SCREEN, HOSP PERFORMED  I-STAT TROPONIN, ED  I-STAT BETA HCG BLOOD, ED ([facility], [facility], [facility] ONLY)  I-STAT TROPONIN, ED    EKG EKG Interpretation  Date/Time:  Sunday November 03 2018 12:52:57 EDT Ventricular Rate:  87 PR Interval:  146 QRS Duration: 90 QT Interval:  394 QTC Calculation: 474 R Axis:   -59 Text Interpretation:  Sinus rhythm with Premature atrial complexes Left axis deviation Pulmonary disease pattern Nonspecific T wave abnormality Prolonged QT Abnormal ECG No significant change since last tracing Confirmed by Linwood Dibbles [phone]) on 11/03/2018 12:57:35 PM   Radiology Dg Chest 2 View  Result Date: 11/03/2018 CLINICAL DATA:  Patient felt tightness in her chest while at church and is now feeling better, non-smoker no previous surgery to chest EXAM: CHEST - 2 VIEW COMPARISON:  08/07/2007 FINDINGS: Midline trachea. Normal heart size and mediastinal contours. No pleural  effusion or pneumothorax. There is partial obscuration of the left heart border inferiorly on the frontal radiograph. This is not correlated on the lateral radiograph. New compared to 08/07/2007. IMPRESSION: Subtle increased density along the left heart border on the frontal radiograph. No correlate on the lateral view. Although this may simply represent an area of volume loss and atelectasis, if symptoms persist or warrant, recommend radiographic follow-up at 5-7 days to exclude developing airspace disease. Electronically Signed   By: Jeronimo Greaves M.D.   On: 11/03/2018 14:07  Procedures Procedures (including critical care time)  Medications Ordered in ED Medications  insulin aspart (novoLOG) injection 0-9 Units (has no administration in time range)  insulin aspart (novoLOG) injection 0-5 Units (has no administration in time range)  lactated ringers bolus 1,000 mL (1,000 mLs Intravenous New Bag/Given 11/03/18 1715)  aspirin chewable tablet 324 mg (324 mg Oral Given 11/03/18 1718)     Initial Impression / Assessment and Plan / ED Course  I have reviewed the triage vital signs and the nursing notes.  Pertinent labs & imaging results that were available during my care of the patient were reviewed by me and considered in my medical decision making (see chart for details).        58 year old female with significant past medical history of hypertension, hypercholesterol, diabetes who presents with an episode of chest pain lasting approximately 1 hour, center of the chest, pressure-like in nature.  Patient is unsure for cardiac history however thinks she might of had a heart attack while in [place] however unsure does not believe that she is had a cardiac catheterization.  Patient does have history of cocaine use based on documentation.  Will obtain UDS.  Concern for ACS, GERD, cocaine use.  Aspirin given.  We will obtain laboratory studies including delta troponin.  Due to risk factors patient's  heart score is 4.  No significant risk factors of infection, no cough, no long distance flight, no peripheral lower extremity swelling, no clinical symptoms of PE.  Non-tachycardic in the room.  Pain is not sharp.  No shortness of breath.  Laboratory studies indicate mild dehydration with worsening kidney function based on labs in 2014 however this could be patient's new baseline.  We will give fluid here in the emergency department.  Laboratory studies indicate negative delta troponin.  EKG shows no ST elevation or depression, T wave inversion in anterior leads, and biphasic T wave in the lateral leads.  We will admit for cardiac work-up and evaluation.  Inpatient team in agreement with this plan.  The above care was discussed and agreed upon by my attending physician.    Final Clinical Impressions(s) / ED Diagnoses   Final diagnoses:  Chest pain, unspecified type    ED Discharge Orders    None       Dahlia Client, MD 11/03/18 Karren Burly    Cathren Laine, MD 11/03/18 2217

## 2018-11-04 ENCOUNTER — Observation Stay (HOSPITAL_BASED_OUTPATIENT_CLINIC_OR_DEPARTMENT_OTHER): Payer: Self-pay

## 2018-11-04 DIAGNOSIS — R079 Chest pain, unspecified: Secondary | ICD-10-CM

## 2018-11-04 LAB — LIPID PANEL
Cholesterol: 241 mg/dL — ABNORMAL HIGH (ref 0–200)
HDL: 30 mg/dL — ABNORMAL LOW (ref >40)
LDL Cholesterol: UNDETERMINED mg/dL (ref 0–99)
Total CHOL/HDL Ratio: 8 RATIO
Triglycerides: 438 mg/dL — ABNORMAL HIGH (ref <150)
VLDL: UNDETERMINED mg/dL (ref 0–40)

## 2018-11-04 LAB — BASIC METABOLIC PANEL
Anion gap: 8 (ref 5–15)
BUN: 17 mg/dL (ref 6–20)
CO2: 26 mmol/L (ref 22–32)
Calcium: 8.9 mg/dL (ref 8.9–10.3)
Chloride: 104 mmol/L (ref 98–111)
Creatinine, Ser: 1.29 mg/dL — ABNORMAL HIGH (ref 0.44–1.00)
GFR calc Af Amer: 54 mL/min — ABNORMAL LOW (ref >60)
GFR calc non Af Amer: 46 mL/min — ABNORMAL LOW (ref >60)
Glucose, Bld: 93 mg/dL (ref 70–99)
Potassium: 4.2 mmol/L (ref 3.5–5.1)
Sodium: 138 mmol/L (ref 135–145)

## 2018-11-04 LAB — CBC
HCT: 33.6 % — ABNORMAL LOW (ref 36.0–46.0)
Hemoglobin: 11.3 g/dL — ABNORMAL LOW (ref 12.0–15.0)
MCH: 30 pg (ref 26.0–34.0)
MCHC: 33.6 g/dL (ref 30.0–36.0)
MCV: 89.1 fL (ref 80.0–100.0)
Platelets: 294 10*3/uL (ref 150–400)
RBC: 3.77 MIL/uL — ABNORMAL LOW (ref 3.87–5.11)
RDW: 12.1 % (ref 11.5–15.5)
WBC: 10 10*3/uL (ref 4.0–10.5)
nRBC: 0 % (ref 0.0–0.2)

## 2018-11-04 LAB — POCT I-STAT TROPONIN I: Troponin i, poc: 0 ng/mL (ref 0.00–0.08)

## 2018-11-04 LAB — TROPONIN I
Troponin I: <0.03 ng/mL (ref <0.03)
Troponin I: <0.03 ng/mL (ref <0.03)

## 2018-11-04 LAB — ECHOCARDIOGRAM COMPLETE
Height: 67.5 in
Weight: 2700.8 oz

## 2018-11-04 LAB — RAPID URINE DRUG SCREEN, HOSP PERFORMED
Amphetamines: NOT DETECTED
Barbiturates: NOT DETECTED
Benzodiazepines: NOT DETECTED
Cocaine: NOT DETECTED
Opiates: NOT DETECTED
Tetrahydrocannabinol: NOT DETECTED

## 2018-11-04 LAB — I-STAT BETA HCG BLOOD, ED (NOT ORDERABLE): I-stat hCG, quantitative: <5 m[IU]/mL (ref <5)

## 2018-11-04 LAB — GLUCOSE, CAPILLARY
Glucose-Capillary: 94 mg/dL (ref 70–99)
Glucose-Capillary: 172 mg/dL — ABNORMAL HIGH (ref 70–99)

## 2018-11-04 LAB — HIV ANTIBODY (ROUTINE TESTING W REFLEX): HIV Screen 4th Generation wRfx: NONREACTIVE
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
## 2018-11-04 NOTE — Progress Notes (Signed)
  Echocardiogram 2D Echocardiogram has been performed.  Delcie Roch 11/04/2018, 9:40 AM

## 2018-11-04 NOTE — Care Management (Signed)
1555 11-04-18 CM provided patient with [employer]. Pt  has PCP- Chales Abrahams Placey that she will follow up with on November 13, 2018. No further needs from CM at this time. Gala Lewandowsky, RN,BSN Case Manager [phone]

## 2020-02-15 ENCOUNTER — Emergency Department (HOSPITAL_COMMUNITY): Payer: Self-pay

## 2020-02-15 ENCOUNTER — Emergency Department (HOSPITAL_COMMUNITY)
Admission: EM | Admit: 2020-02-15 | Discharge: 2020-02-15 | Disposition: A | Discharge status: Home / Self Care | Payer: Self-pay | Attending: Emergency Medicine | Admitting: Emergency Medicine

## 2020-02-15 ENCOUNTER — Other Ambulatory Visit: Payer: Self-pay

## 2020-02-15 DIAGNOSIS — E162 Hypoglycemia, unspecified: Secondary | ICD-10-CM

## 2020-02-15 DIAGNOSIS — E11649 Type 2 diabetes mellitus with hypoglycemia without coma: Secondary | ICD-10-CM | POA: Insufficient documentation

## 2020-02-15 DIAGNOSIS — R079 Chest pain, unspecified: Secondary | ICD-10-CM

## 2020-02-15 DIAGNOSIS — Z7982 Long term (current) use of aspirin: Secondary | ICD-10-CM | POA: Insufficient documentation

## 2020-02-15 DIAGNOSIS — R410 Disorientation, unspecified: Secondary | ICD-10-CM

## 2020-02-15 DIAGNOSIS — R4182 Altered mental status, unspecified: Secondary | ICD-10-CM | POA: Insufficient documentation

## 2020-02-15 DIAGNOSIS — R1084 Generalized abdominal pain: Secondary | ICD-10-CM | POA: Insufficient documentation

## 2020-02-15 DIAGNOSIS — Z79899 Other long term (current) drug therapy: Secondary | ICD-10-CM | POA: Insufficient documentation

## 2020-02-15 DIAGNOSIS — Z794 Long term (current) use of insulin: Secondary | ICD-10-CM | POA: Insufficient documentation

## 2020-02-15 LAB — COMPREHENSIVE METABOLIC PANEL
ALT: 18 U/L (ref 0–44)
AST: 23 U/L (ref 15–41)
Albumin: 3.9 g/dL (ref 3.5–5.0)
Alkaline Phosphatase: 88 U/L (ref 38–126)
Anion gap: 13 (ref 5–15)
BUN: 21 mg/dL — ABNORMAL HIGH (ref 6–20)
CO2: 18 mmol/L — ABNORMAL LOW (ref 22–32)
Calcium: 8.7 mg/dL — ABNORMAL LOW (ref 8.9–10.3)
Chloride: 106 mmol/L (ref 98–111)
Creatinine, Ser: 1.74 mg/dL — ABNORMAL HIGH (ref 0.44–1.00)
GFR calc Af Amer: 37 mL/min — ABNORMAL LOW (ref >60)
GFR calc non Af Amer: 32 mL/min — ABNORMAL LOW (ref >60)
Glucose, Bld: 83 mg/dL (ref 70–99)
Potassium: 4.3 mmol/L (ref 3.5–5.1)
Sodium: 137 mmol/L (ref 135–145)
Total Bilirubin: 0.6 mg/dL (ref 0.3–1.2)
Total Protein: 7.2 g/dL (ref 6.5–8.1)

## 2020-02-15 LAB — I-STAT CHEM 8, ED
BUN: 25 mg/dL — ABNORMAL HIGH (ref 6–20)
Calcium, Ion: 1.16 mmol/L (ref 1.15–1.40)
Chloride: 105 mmol/L (ref 98–111)
Creatinine, Ser: 1.8 mg/dL — ABNORMAL HIGH (ref 0.44–1.00)
Glucose, Bld: 77 mg/dL (ref 70–99)
HCT: 35 % — ABNORMAL LOW (ref 36.0–46.0)
Hemoglobin: 11.9 g/dL — ABNORMAL LOW (ref 12.0–15.0)
Potassium: 4.2 mmol/L (ref 3.5–5.1)
Sodium: 140 mmol/L (ref 135–145)
TCO2: 24 mmol/L (ref 22–32)

## 2020-02-15 LAB — CBG MONITORING, ED
Glucose-Capillary: 103 mg/dL — ABNORMAL HIGH (ref 70–99)
Glucose-Capillary: 101 mg/dL — ABNORMAL HIGH (ref 70–99)
Glucose-Capillary: 61 mg/dL — ABNORMAL LOW (ref 70–99)
Glucose-Capillary: 79 mg/dL (ref 70–99)
Glucose-Capillary: 55 mg/dL — ABNORMAL LOW (ref 70–99)
Glucose-Capillary: 108 mg/dL — ABNORMAL HIGH (ref 70–99)

## 2020-02-15 LAB — CBC WITH DIFFERENTIAL/PLATELET
Abs Immature Granulocytes: 0.03 10*3/uL (ref 0.00–0.07)
Basophils Absolute: 0 10*3/uL (ref 0.0–0.1)
Basophils Relative: 0 %
Eosinophils Absolute: 0.1 10*3/uL (ref 0.0–0.5)
Eosinophils Relative: 1 %
HCT: 33.4 % — ABNORMAL LOW (ref 36.0–46.0)
Hemoglobin: 11.2 g/dL — ABNORMAL LOW (ref 12.0–15.0)
Immature Granulocytes: 0 %
Lymphocytes Relative: 20 %
Lymphs Abs: 2 10*3/uL (ref 0.7–4.0)
MCH: 29.8 pg (ref 26.0–34.0)
MCHC: 33.5 g/dL (ref 30.0–36.0)
MCV: 88.8 fL (ref 80.0–100.0)
Monocytes Absolute: 0.6 10*3/uL (ref 0.1–1.0)
Monocytes Relative: 6 %
Neutro Abs: 7 10*3/uL (ref 1.7–7.7)
Neutrophils Relative %: 73 %
Platelets: 366 10*3/uL (ref 150–400)
RBC: 3.76 MIL/uL — ABNORMAL LOW (ref 3.87–5.11)
RDW: 12.7 % (ref 11.5–15.5)
WBC: 9.8 10*3/uL (ref 4.0–10.5)
nRBC: 0 % (ref 0.0–0.2)

## 2020-02-15 LAB — TROPONIN I (HIGH SENSITIVITY)
Troponin I (High Sensitivity): 3 ng/L (ref <18)
Troponin I (High Sensitivity): <2 ng/L (ref <18)

## 2020-02-15 LAB — LIPASE, BLOOD: Lipase: 42 U/L (ref 11–51)

## 2020-02-15 LAB — PROTIME-INR
INR: 1.1 (ref 0.8–1.2)
Prothrombin Time: 13.6 seconds (ref 11.4–15.2)

## 2020-02-15 LAB — MAGNESIUM: Magnesium: 1 mg/dL — ABNORMAL LOW (ref 1.7–2.4)

## 2020-02-15 LAB — TSH: TSH: 1.42 u[IU]/mL (ref 0.350–4.500)

## 2020-02-15 LAB — LACTIC ACID, PLASMA: Lactic Acid, Venous: 1.7 mmol/L (ref 0.5–1.9)

## 2020-02-15 MED ORDER — DEXTROSE 50 % IV SOLN
25.0000 mL | Freq: Once | INTRAVENOUS | Status: AC
  Administered 2020-02-15: 25 mL via INTRAVENOUS

## 2020-02-15 MED ORDER — MAGNESIUM SULFATE 2 GM/50ML IV SOLN
2.0000 g | Freq: Once | INTRAVENOUS | Status: AC
  Administered 2020-02-15: 2 g via INTRAVENOUS
  Filled 2020-02-15: qty 50

## 2020-02-15 MED ORDER — MAGNESIUM 30 MG PO TABS
30.0000 mg | ORAL_TABLET | Freq: Every day | ORAL | 0 refills | Status: DC
Start: 2020-02-15 — End: 2020-04-23

## 2020-02-15 MED ORDER — DEXTROSE 5 % IV SOLN: Freq: Once | INTRAVENOUS | Status: AC

## 2020-02-15 MED ORDER — IOHEXOL 9 MG/ML PO SOLN: 500.0000 mL | ORAL | Status: AC

## 2020-02-15 MED ORDER — IOHEXOL 9 MG/ML PO SOLN
ORAL | Status: AC
  Filled 2020-02-15: qty 500

## 2020-02-15 NOTE — ED Notes (Signed)
Contacted family who will be here in approx 10 min

## 2020-02-15 NOTE — ED Triage Notes (Signed)
Pt here via ems from home alone with co AMS and hypoglycemia. Pt found with an CBG of 38 ems gave 25 grams of D10 and that brought her CBG up to 272 and then 10 mins later ems CBG was 174. Our CBG in the ED is 103. Pt was diaphoretic with CO CP and abd pain ems gave 324 of aspirin once pt was a little more alert. Temp with EMS was 96.1. 18G rt AC

## 2020-02-15 NOTE — ED Notes (Signed)
Pt verbalized understanding of d/c instructions, follow up care and s/s requiring return to ed. Pt educated on needed changes to diet.  Pt had no further questions at this time and was transported to exit.

## 2020-02-15 NOTE — ED Notes (Signed)
MD made aware of CBG result. Pt given gingerale and apple sauce.

## 2020-02-15 NOTE — Discharge Instructions (Signed)
You were seen in the emergency department for evaluation of confusion.  Your blood sugar was low and this improved with some medication.  You were magnesium was low and this was also repleted.  Please continue your regular medications and frequently monitor your blood sugars.  Will be important that you eat so that your blood sugars stay stable.  Contact your primary care doctor for follow-up this week.  Return to the emergency department for any worsening or concerning symptoms.

## 2020-02-15 NOTE — ED Provider Notes (Signed)
[facility] EMERGENCY DEPARTMENT Provider Note   CSN: [id number] Arrival date & time: 02/15/20  1513     History Chief Complaint  Patient presents with  . Altered Mental Status  . Hypoglycemia    Tamara [name] is a 59 y.o. female.  She is brought in by EMS after being found with a low blood sugar of 38.  EMS gave her D10 which improved it and has continued to fall since arrival in the department.  She is complaining of some chest and abdominal pain that she has had before.  She is a rather poor historian.  States has not been eating well.  She says her chest pain is resolved but her abdominal pain is still going on.  No vomiting or diarrhea.  No urinary symptoms.  The history is provided by the patient and the EMS personnel.  Hypoglycemia Initial blood sugar:  38 Severity:  Moderate Onset quality:  Unable to specify Timing:  Unable to specify Progression:  Improving Diabetic status:  Controlled with insulin Context: decreased oral intake   Relieved by:  IV glucose Ineffective treatments:  None tried Associated symptoms: altered mental status   Associated symptoms: no shortness of breath and no vomiting   Abdominal Pain Pain location:  Generalized Pain quality: cramping   Pain radiates to:  Does not radiate Pain severity:  Moderate Onset quality:  Gradual Timing:  Constant Progression:  Unchanged Chronicity:  Recurrent Context: not sick contacts and not trauma   Relieved by:  None tried Worsened by:  Nothing Ineffective treatments:  None tried Associated symptoms: chest pain   Associated symptoms: no cough, no diarrhea, no dysuria, no fever, no hematuria, no nausea, no shortness of breath, no sore throat and no vomiting        History reviewed. No pertinent past medical history.  Patient Active Problem List   Diagnosis Date Noted  . Chest pain 11/03/2018  . Asthma with acute exacerbation 01/14/2013  . Diabetes (HCC) 01/14/2013  . Dyslipidemia  01/14/2013    History reviewed. No pertinent surgical history.   OB History   No obstetric history on file.     History reviewed. No pertinent family history.  Social History   Tobacco Use  . Smoking status: Not on file  Substance Use Topics  . Alcohol use: Not on file  . Drug use: Not on file    Home Medications Prior to Admission medications   Medication Sig Start Date End Date Taking? Authorizing Provider  albuterol (PROVENTIL HFA;VENTOLIN HFA) 108 (90 BASE) MCG/ACT inhaler Inhale 2 puffs into the lungs every 6 (six) hours as needed for wheezing. 10/04/12   Johnson, Clanford L, MD  aspirin EC 81 MG tablet Take 81 mg by mouth daily.    [provider]  atorvastatin (LIPITOR) 80 MG tablet Take 40 mg by mouth daily.    [provider]  buPROPion (WELLBUTRIN SR) 150 MG 12 hr tablet Take 150 mg by mouth daily.    [provider]  Fluticasone-Salmeterol (ADVAIR) 250-50 MCG/DOSE AEPB Inhale 1 puff into the lungs 2 (two) times daily.    [provider]  gabapentin (NEURONTIN) 100 MG capsule Take 100-300 mg by mouth See admin instructions. Take 1 capsule (100mg ) twice daily and 3 capsules (300mg ) at bedtime.    [provider]  insulin glargine (LANTUS) 100 UNIT/ML injection Inject 17 Units into the skin at bedtime.     [provider]  loratadine (CLARITIN) 10 MG tablet Take  1 tablet (10 mg total) by mouth daily. 01/14/13   Ghimire, Werner LeanShanker M, MD  losartan (COZAAR) 100 MG tablet Take 100 mg by mouth daily.    [provider]  magnesium 30 MG tablet Take 1 tablet (30 mg total) by mouth daily. 02/15/20   Terrilee FilesButler, Lafonda Patron C, MD  metFORMIN (GLUCOPHAGE) 1000 MG tablet Take 1,000 mg by mouth 2 (two) times daily with a meal.    [provider]  montelukast (SINGULAIR) 10 MG tablet Take 1 tablet (10 mg total) by mouth at bedtime. 10/04/12   Johnson, Clanford L, MD  omeprazole (PRILOSEC) 20 MG capsule Take 20 mg by mouth daily.     [provider]  polyethylene glycol (MIRALAX / GLYCOLAX) packet Take 17 g by mouth daily as needed for mild constipation.    [provider]    Allergies    Patient has no known allergies.  Review of Systems   Review of Systems  Constitutional: Negative for fever.  HENT: Negative for sore throat.   Eyes: Negative for visual disturbance.  Respiratory: Negative for cough and shortness of breath.   Cardiovascular: Positive for chest pain.  Gastrointestinal: Positive for abdominal pain. Negative for diarrhea, nausea and vomiting.  Genitourinary: Negative for dysuria and hematuria.  Musculoskeletal: Negative for neck pain.  Skin: Negative for rash.  Neurological: Negative for headaches.    Physical Exam Updated Vital Signs BP (!) 140/92 (BP Location: Right Arm)   Pulse 85   Temp 97.7 F (36.5 C) (Oral)   Resp 16   SpO2 100%   Physical Exam Vitals and nursing note reviewed.  Constitutional:      General: She is not in acute distress.    Appearance: Normal appearance. She is well-developed.  HENT:     Head: Normocephalic and atraumatic.     Mouth/Throat:     Mouth: Mucous membranes are moist.     Pharynx: Oropharynx is clear.  Eyes:     Conjunctiva/sclera: Conjunctivae normal.  Cardiovascular:     Rate and Rhythm: Normal rate and regular rhythm.     Heart sounds: No murmur heard.   Pulmonary:     Effort: Pulmonary effort is normal. No respiratory distress.     Breath sounds: Normal breath sounds.  Abdominal:     Palpations: Abdomen is soft.     Tenderness: There is no abdominal tenderness.  Musculoskeletal:        General: No deformity or signs of injury. Normal range of motion.     Cervical back: Neck supple.     Right lower leg: No edema.     Left lower leg: No edema.  Skin:    General: Skin is warm and dry.     Capillary Refill: Capillary refill takes less than 2 seconds.  Neurological:     General: No focal deficit present.     Mental  Status: She is alert.     Sensory: No sensory deficit.     Motor: No weakness.     ED Results / Procedures / Treatments   Labs (all labs ordered are listed, but only abnormal results are displayed) Labs Reviewed  COMPREHENSIVE METABOLIC PANEL - Abnormal; Notable for the following components:      Result Value   CO2 18 (*)    BUN 21 (*)    Creatinine, Ser 1.74 (*)    Calcium 8.7 (*)    GFR calc non Af Amer 32 (*)    GFR calc Af [place] DagoAmer  37 (*)    All other components within normal limits  CBC WITH DIFFERENTIAL/PLATELET - Abnormal; Notable for the following components:   RBC 3.76 (*)    Hemoglobin 11.2 (*)    HCT 33.4 (*)    All other components within normal limits  MAGNESIUM - Abnormal; Notable for the following components:   Magnesium 1.0 (*)    All other components within normal limits  CBG MONITORING, ED - Abnormal; Notable for the following components:   Glucose-Capillary 103 (*)    All other components within normal limits  CBG MONITORING, ED - Abnormal; Notable for the following components:   Glucose-Capillary 61 (*)    All other components within normal limits  I-STAT CHEM 8, ED - Abnormal; Notable for the following components:   BUN 25 (*)    Creatinine, Ser 1.80 (*)    Hemoglobin 11.9 (*)    HCT 35.0 (*)    All other components within normal limits  CBG MONITORING, ED - Abnormal; Notable for the following components:   Glucose-Capillary 101 (*)    All other components within normal limits  CBG MONITORING, ED - Abnormal; Notable for the following components:   Glucose-Capillary 55 (*)    All other components within normal limits  CBG MONITORING, ED - Abnormal; Notable for the following components:   Glucose-Capillary 108 (*)    All other components within normal limits  CULTURE, BLOOD (ROUTINE X 2)  CULTURE, BLOOD (ROUTINE X 2)  URINE CULTURE  LIPASE, BLOOD  LACTIC ACID, PLASMA  PROTIME-INR  TSH  CBG MONITORING, ED  TROPONIN I (HIGH SENSITIVITY)  TROPONIN  I (HIGH SENSITIVITY)    EKG EKG Interpretation  Date/Time:  Sunday February 15 2020 15:37:56 EDT Ventricular Rate:  81 PR Interval:    QRS Duration: 99 QT Interval:  505 QTC Calculation: 587 R Axis:   -35 Text Interpretation: Sinus rhythm Atrial premature complexes Left axis deviation Borderline T abnormalities, anterior leads Prolonged QT interval new qtc prolongation from prior 3/20 Confirmed by Meridee Score [phone]) on 02/15/2020 3:55:55 PM   Radiology CT Abdomen Pelvis Wo Contrast  Result Date: 02/15/2020 CLINICAL DATA:  59 year old female with abdominal distension and pain. EXAM: CT ABDOMEN AND PELVIS WITHOUT CONTRAST TECHNIQUE: Multidetector CT imaging of the abdomen and pelvis was performed following the standard protocol without IV contrast. COMPARISON:  CT abdomen pelvis dated 08/07/2007. FINDINGS: Evaluation of this exam is limited in the absence of intravenous contrast. Lower chest: The visualized lung bases are clear. No intra-abdominal free air or free fluid. Hepatobiliary: The liver is unremarkable. No intrahepatic biliary ductal dilatation. There is layering stone within the gallbladder. No pericholecystic fluid or evidence of acute cholecystitis by CT. Ultrasound may provide better evaluation if there is clinical concern for acute cholecystitis. Pancreas: Unremarkable. No pancreatic ductal dilatation or surrounding inflammatory changes. Spleen: Normal in size without focal abnormality. Adrenals/Urinary Tract: The adrenal glands are unremarkable. There is no hydronephrosis or nephrolithiasis on either side. The visualized ureters and urinary bladder appear unremarkable. Stomach/Bowel: There is loose stool throughout the colon compatible with diarrheal state. Correlation with clinical exam and stool cultures recommended. There is no bowel obstruction or active inflammation. The appendix is not visualized with certainty. No inflammatory changes identified in the right lower quadrant.  Vascular/Lymphatic: Mild aortoiliac atherosclerotic disease. The IVC is unremarkable. No portal venous gas. There is no adenopathy. Reproductive: Small partially calcified uterine fibroids. Other: None Musculoskeletal: Lower lumbar facet arthropathy. No acute osseous pathology. IMPRESSION: 1. Diarrheal state.  Correlation with clinical exam and stool cultures recommended. No bowel obstruction. 2. Cholelithiasis. 3. Aortic Atherosclerosis (ICD10-I70.0). Electronically Signed   By: Anner Crete M.D.   On: 02/15/2020 20:06   DG Chest Port 1 View  Result Date: 02/15/2020 CLINICAL DATA:  59 year old female with chest pain. EXAM: PORTABLE CHEST 1 VIEW COMPARISON:  Chest radiograph dated 11/03/2018. FINDINGS: The lungs are clear. There is no pleural effusion or pneumothorax. The cardiac silhouette is within limits. No acute osseous pathology. Degenerative changes of the spine. IMPRESSION: No active disease. Electronically Signed   By: Anner Crete M.D.   On: 02/15/2020 16:36    Procedures Procedures (including critical care time)  Medications Ordered in ED Medications  iohexol (OMNIPAQUE) 9 MG/ML oral solution 500 mL (has no administration in time range)  dextrose 50 % solution 25 mL (25 mLs Intravenous Given 02/15/20 1614)  dextrose 5 % solution ( Intravenous Stopped 02/15/20 2231)  magnesium sulfate IVPB 2 g 50 mL (0 g Intravenous Stopped 02/15/20 1834)    ED Course  I have reviewed the triage vital signs and the nursing notes.  Pertinent labs & imaging results that were available during my care of the patient were reviewed by me and considered in my medical decision making (see chart for details).  Clinical Course as of Feb 15 1118  Sun Feb 15, 2020  1951 Reevaluation, patient states she feels better now and would like to go home.  I was able to talk her into completing her CAT scan to make sure there is nothing more serious.  She is much more clear in her thinking and level of alertness.    [MB]    Clinical Course User Index [MB] Hayden Rasmussen, MD   MDM Rules/Calculators/A&P                         This patient complains of altered mental status low blood sugar chest pain abdominal pain; this involves an extensive number of treatment Options and is a complaint that carries with it a high risk of complications and Morbidity. The differential includes overdose, overmedication, poor p.o. intake stroke, ACS, obstruction, metabolic derangement, dehydration  I ordered, reviewed and interpreted labs, which included CBC with normal white count, stable hemoglobin, chemistries with low bicarb and elevated creatinine consistent with some element of dehydration, low magnesium 1.0, normal TSH lactate, blood cultures were drawn I ordered medication  dextrose for low blood sugar and IV magnesium for low magnesium I ordered imaging studies which included chest x-ray and CT abdomen and pelvis and I independently    visualized and interpreted imaging which showed no acute findings in the chest, cholelithiasis, fluid-filled bowels no obstruction Previous records obtained and reviewed in epic including admission last year for chest pain that she was admitted for.  Critical Interventions: None  After the interventions stated above, I reevaluated the patient and found patient symptoms to be resolved.  Her mental status is improved and her glucose is stabilized.  She denies any further chest pain or abdominal pain.  She is asking to go home.  She will call family for a ride.  Will place on supplemental magnesium.  Recommended close follow-up with her primary care doctor.  Return instructions discussed.  Stressed need for increased oral intake.  Final Clinical Impression(s) / ED Diagnoses Final diagnoses:  Hypoglycemia  Confusion  Hypomagnesemia  Nonspecific chest pain    Rx / DC Orders ED Discharge Orders  Ordered    magnesium 30 MG tablet  Daily     Discontinue  Reprint      02/15/20 2208           Terrilee Files, MD 02/16/20 1125

## 2020-02-16 ENCOUNTER — Encounter (HOSPITAL_COMMUNITY): Payer: Self-pay | Admitting: Emergency Medicine

## 2020-02-20 LAB — CULTURE, BLOOD (ROUTINE X 2)
Culture: NO GROWTH
Culture: NO GROWTH
Special Requests: ADEQUATE

## 2020-02-29 ENCOUNTER — Inpatient Hospital Stay (HOSPITAL_COMMUNITY)
Admission: EM | Admit: 2020-02-29 | Discharge: 2020-03-04 | DRG: 100 | Disposition: A | Discharge status: Home / Self Care | Payer: Self-pay | Attending: Family Medicine | Admitting: Family Medicine

## 2020-02-29 ENCOUNTER — Emergency Department (HOSPITAL_COMMUNITY): Payer: Self-pay

## 2020-02-29 DIAGNOSIS — Z20822 Contact with and (suspected) exposure to covid-19: Secondary | ICD-10-CM | POA: Diagnosis present

## 2020-02-29 DIAGNOSIS — J45909 Unspecified asthma, uncomplicated: Secondary | ICD-10-CM | POA: Diagnosis present

## 2020-02-29 DIAGNOSIS — Z7951 Long term (current) use of inhaled steroids: Secondary | ICD-10-CM

## 2020-02-29 DIAGNOSIS — R1110 Vomiting, unspecified: Secondary | ICD-10-CM

## 2020-02-29 DIAGNOSIS — E7220 Disorder of urea cycle metabolism, unspecified: Secondary | ICD-10-CM | POA: Diagnosis present

## 2020-02-29 DIAGNOSIS — E538 Deficiency of other specified B group vitamins: Secondary | ICD-10-CM | POA: Diagnosis present

## 2020-02-29 DIAGNOSIS — G9341 Metabolic encephalopathy: Secondary | ICD-10-CM | POA: Diagnosis present

## 2020-02-29 DIAGNOSIS — G9381 Temporal sclerosis: Secondary | ICD-10-CM | POA: Diagnosis present

## 2020-02-29 DIAGNOSIS — R1310 Dysphagia, unspecified: Secondary | ICD-10-CM

## 2020-02-29 DIAGNOSIS — R4189 Other symptoms and signs involving cognitive functions and awareness: Secondary | ICD-10-CM | POA: Diagnosis present

## 2020-02-29 DIAGNOSIS — E119 Type 2 diabetes mellitus without complications: Secondary | ICD-10-CM

## 2020-02-29 DIAGNOSIS — E785 Hyperlipidemia, unspecified: Secondary | ICD-10-CM | POA: Diagnosis present

## 2020-02-29 DIAGNOSIS — R32 Unspecified urinary incontinence: Secondary | ICD-10-CM | POA: Diagnosis present

## 2020-02-29 DIAGNOSIS — R4182 Altered mental status, unspecified: Secondary | ICD-10-CM

## 2020-02-29 DIAGNOSIS — E872 Acidosis: Secondary | ICD-10-CM | POA: Diagnosis present

## 2020-02-29 DIAGNOSIS — D72829 Elevated white blood cell count, unspecified: Secondary | ICD-10-CM | POA: Diagnosis present

## 2020-02-29 DIAGNOSIS — E349 Endocrine disorder, unspecified: Secondary | ICD-10-CM

## 2020-02-29 DIAGNOSIS — G40909 Epilepsy, unspecified, not intractable, without status epilepticus: Principal | ICD-10-CM | POA: Diagnosis present

## 2020-02-29 DIAGNOSIS — I129 Hypertensive chronic kidney disease with stage 1 through stage 4 chronic kidney disease, or unspecified chronic kidney disease: Secondary | ICD-10-CM | POA: Diagnosis present

## 2020-02-29 DIAGNOSIS — F329 Major depressive disorder, single episode, unspecified: Secondary | ICD-10-CM | POA: Diagnosis present

## 2020-02-29 DIAGNOSIS — K224 Dyskinesia of esophagus: Secondary | ICD-10-CM | POA: Diagnosis present

## 2020-02-29 DIAGNOSIS — F0390 Unspecified dementia without behavioral disturbance: Secondary | ICD-10-CM | POA: Diagnosis present

## 2020-02-29 DIAGNOSIS — N1832 Chronic kidney disease, stage 3b: Secondary | ICD-10-CM | POA: Diagnosis present

## 2020-02-29 DIAGNOSIS — G9340 Encephalopathy, unspecified: Secondary | ICD-10-CM | POA: Diagnosis present

## 2020-02-29 DIAGNOSIS — E870 Hyperosmolality and hypernatremia: Secondary | ICD-10-CM | POA: Diagnosis present

## 2020-02-29 DIAGNOSIS — N189 Chronic kidney disease, unspecified: Secondary | ICD-10-CM

## 2020-02-29 DIAGNOSIS — Z7982 Long term (current) use of aspirin: Secondary | ICD-10-CM

## 2020-02-29 DIAGNOSIS — N179 Acute kidney failure, unspecified: Secondary | ICD-10-CM | POA: Diagnosis present

## 2020-02-29 DIAGNOSIS — R569 Unspecified convulsions: Secondary | ICD-10-CM

## 2020-02-29 DIAGNOSIS — Z9119 Patient's noncompliance with other medical treatment and regimen: Secondary | ICD-10-CM

## 2020-02-29 DIAGNOSIS — Z794 Long term (current) use of insulin: Secondary | ICD-10-CM

## 2020-02-29 DIAGNOSIS — E1122 Type 2 diabetes mellitus with diabetic chronic kidney disease: Secondary | ICD-10-CM | POA: Diagnosis present

## 2020-02-29 DIAGNOSIS — Z79899 Other long term (current) drug therapy: Secondary | ICD-10-CM

## 2020-02-29 DIAGNOSIS — E860 Dehydration: Secondary | ICD-10-CM | POA: Diagnosis present

## 2020-02-29 DIAGNOSIS — W19XXXA Unspecified fall, initial encounter: Secondary | ICD-10-CM | POA: Diagnosis present

## 2020-02-29 DIAGNOSIS — D649 Anemia, unspecified: Secondary | ICD-10-CM | POA: Diagnosis present

## 2020-02-29 DIAGNOSIS — I10 Essential (primary) hypertension: Secondary | ICD-10-CM

## 2020-02-29 DIAGNOSIS — Z8249 Family history of ischemic heart disease and other diseases of the circulatory system: Secondary | ICD-10-CM

## 2020-02-29 HISTORY — DX: Other amnesia: R41.3

## 2020-02-29 LAB — CBC WITH DIFFERENTIAL/PLATELET
Abs Immature Granulocytes: 0.05 10*3/uL (ref 0.00–0.07)
Basophils Absolute: 0 10*3/uL (ref 0.0–0.1)
Basophils Relative: 0 %
Eosinophils Absolute: 0 10*3/uL (ref 0.0–0.5)
Eosinophils Relative: 0 %
HCT: 40.1 % (ref 36.0–46.0)
Hemoglobin: 13 g/dL (ref 12.0–15.0)
Immature Granulocytes: 0 %
Lymphocytes Relative: 23 %
Lymphs Abs: 2.8 10*3/uL (ref 0.7–4.0)
MCH: 29 pg (ref 26.0–34.0)
MCHC: 32.4 g/dL (ref 30.0–36.0)
MCV: 89.5 fL (ref 80.0–100.0)
Monocytes Absolute: 0.6 10*3/uL (ref 0.1–1.0)
Monocytes Relative: 5 %
Neutro Abs: 8.7 10*3/uL — ABNORMAL HIGH (ref 1.7–7.7)
Neutrophils Relative %: 72 %
Platelets: 443 10*3/uL — ABNORMAL HIGH (ref 150–400)
RBC: 4.48 MIL/uL (ref 3.87–5.11)
RDW: 13.2 % (ref 11.5–15.5)
WBC: 12.1 10*3/uL — ABNORMAL HIGH (ref 4.0–10.5)
nRBC: 0 % (ref 0.0–0.2)

## 2020-02-29 LAB — COMPREHENSIVE METABOLIC PANEL
ALT: 24 U/L (ref 0–44)
AST: 26 U/L (ref 15–41)
Albumin: 4.3 g/dL (ref 3.5–5.0)
Alkaline Phosphatase: 75 U/L (ref 38–126)
Anion gap: 12 (ref 5–15)
BUN: 62 mg/dL — ABNORMAL HIGH (ref 6–20)
CO2: 18 mmol/L — ABNORMAL LOW (ref 22–32)
Calcium: 10.1 mg/dL (ref 8.9–10.3)
Chloride: 111 mmol/L (ref 98–111)
Creatinine, Ser: 2.12 mg/dL — ABNORMAL HIGH (ref 0.44–1.00)
GFR calc Af Amer: 29 mL/min — ABNORMAL LOW (ref >60)
GFR calc non Af Amer: 25 mL/min — ABNORMAL LOW (ref >60)
Glucose, Bld: 167 mg/dL — ABNORMAL HIGH (ref 70–99)
Potassium: 4.2 mmol/L (ref 3.5–5.1)
Sodium: 141 mmol/L (ref 135–145)
Total Bilirubin: 1.1 mg/dL (ref 0.3–1.2)
Total Protein: 8.2 g/dL — ABNORMAL HIGH (ref 6.5–8.1)

## 2020-02-29 LAB — URINALYSIS, COMPLETE (UACMP) WITH MICROSCOPIC
Bilirubin Urine: NEGATIVE
Glucose, UA: NEGATIVE mg/dL
Ketones, ur: 15 mg/dL — AB
Leukocytes,Ua: NEGATIVE
Nitrite: NEGATIVE
Protein, ur: 30 mg/dL — AB
Specific Gravity, Urine: 1.015 (ref 1.005–1.030)
pH: 6 (ref 5.0–8.0)

## 2020-02-29 LAB — RAPID URINE DRUG SCREEN, HOSP PERFORMED
Amphetamines: NOT DETECTED
Barbiturates: NOT DETECTED
Benzodiazepines: NOT DETECTED
Cocaine: NOT DETECTED
Opiates: NOT DETECTED
Tetrahydrocannabinol: NOT DETECTED

## 2020-02-29 LAB — I-STAT ARTERIAL BLOOD GAS, ED
Acid-base deficit: 5 mmol/L — ABNORMAL HIGH (ref 0.0–2.0)
Bicarbonate: 17.8 mmol/L — ABNORMAL LOW (ref 20.0–28.0)
Calcium, Ion: 1.33 mmol/L (ref 1.15–1.40)
HCT: 36 % (ref 36.0–46.0)
Hemoglobin: 12.2 g/dL (ref 12.0–15.0)
O2 Saturation: 98 %
Patient temperature: 98.6
Potassium: 3.5 mmol/L (ref 3.5–5.1)
Sodium: 149 mmol/L — ABNORMAL HIGH (ref 135–145)
TCO2: 19 mmol/L — ABNORMAL LOW (ref 22–32)
pCO2 arterial: 27.1 mmHg — ABNORMAL LOW (ref 32.0–48.0)
pH, Arterial: 7.426 (ref 7.350–7.450)
pO2, Arterial: 106 mmHg (ref 83.0–108.0)

## 2020-02-29 LAB — I-STAT BETA HCG BLOOD, ED (MC, WL, AP ONLY): I-stat hCG, quantitative: 7.3 m[IU]/mL — ABNORMAL HIGH (ref <5)

## 2020-02-29 LAB — TROPONIN I (HIGH SENSITIVITY)
Troponin I (High Sensitivity): 5 ng/L (ref <18)
Troponin I (High Sensitivity): 5 ng/L (ref <18)

## 2020-02-29 LAB — AMMONIA: Ammonia: 79 umol/L — ABNORMAL HIGH (ref 9–35)

## 2020-02-29 LAB — LACTIC ACID, PLASMA: Lactic Acid, Venous: 1.9 mmol/L (ref 0.5–1.9)

## 2020-02-29 LAB — MAGNESIUM: Magnesium: 2.5 mg/dL — ABNORMAL HIGH (ref 1.7–2.4)

## 2020-02-29 LAB — ETHANOL: Alcohol, Ethyl (B): <10 mg/dL (ref <10)

## 2020-02-29 LAB — CBG MONITORING, ED: Glucose-Capillary: 164 mg/dL — ABNORMAL HIGH (ref 70–99)

## 2020-02-29 MED ORDER — SODIUM CHLORIDE 0.9 % IV SOLN: INTRAVENOUS | Status: DC

## 2020-02-29 MED ORDER — LACTULOSE 10 GM/15ML PO SOLN
20.0000 g | Freq: Once | ORAL | Status: AC
  Administered 2020-03-01: 20 g via ORAL
  Filled 2020-02-29: qty 30

## 2020-02-29 MED ORDER — SODIUM CHLORIDE 0.9 % IV BOLUS
1000.0000 mL | Freq: Once | INTRAVENOUS | Status: AC
  Administered 2020-02-29: 1000 mL via INTRAVENOUS

## 2020-02-29 NOTE — ED Provider Notes (Signed)
[facility] EMERGENCY DEPARTMENT Provider Note   CSN: [id number] Arrival date & time: 02/29/20  1816     History Chief Complaint  Patient presents with  . Fall  . Altered Mental Status    Tamara [name] is a 59 y.o. female with past medical history significant for asthma, diabetes, hyperlipidemia.  HPI Patient presents to emergency department today with chief complaint of fall and altered mental status.  History is provided by EMS.  EMS notes they were called out to a local [employer].  On their arrival patient was laying on the pavement next to her passenger side car door.  She had an episode of emesis and was incontinent of urine.  Patient's neighbor was there however was also unable to provide any history.  Unsure who called the neighbor as they were not with her at [facility].  EMS states CBG was 126, initial BP 102/70.  She was given 500 ml NS and BP 137/87.  Cervical collar was applied.  Patient is unable to provide any history, level 5 caveat applies as she is altered.      No past medical history on file.  Patient Active Problem List   Diagnosis Date Noted  . Chest pain 11/03/2018  . Asthma with acute exacerbation 01/14/2013  . Diabetes (HCC) 01/14/2013  . Dyslipidemia 01/14/2013    No past surgical history on file.   OB History   No obstetric history on file.     No family history on file.  Social History   Tobacco Use  . Smoking status: Not on file  Substance Use Topics  . Alcohol use: Not on file  . Drug use: Not on file    Home Medications Prior to Admission medications   Medication Sig Start Date End Date Taking? Authorizing Provider  ACETAMINOPHEN EXTRA STRENGTH 500 MG tablet Take 1,000 mg by mouth 3 (three) times daily as needed. 12/01/19   [provider]  albuterol (PROVENTIL HFA;VENTOLIN HFA) 108 (90 BASE) MCG/ACT inhaler Inhale 2 puffs into the lungs every 6 (six) hours as needed for wheezing. 10/04/12   Johnson, Clanford L, MD   aspirin EC 81 MG tablet Take 81 mg by mouth daily.    [provider]  atorvastatin (LIPITOR) 80 MG tablet Take 40 mg by mouth at bedtime.     [provider]  buPROPion (WELLBUTRIN SR) 150 MG 12 hr tablet Take 150 mg by mouth daily.    [provider]  Fluticasone-Salmeterol (ADVAIR) 250-50 MCG/DOSE AEPB Inhale 1 puff into the lungs 2 (two) times daily.    [provider]  gabapentin (NEURONTIN) 100 MG capsule Take 100-300 mg by mouth See admin instructions. Take 1 capsule (100mg ) twice daily and 3 capsules (300mg ) at bedtime.    [provider]  gabapentin (NEURONTIN) 300 MG capsule Take 300 mg by mouth 3 (three) times daily. 01/27/20   [provider]  hydrOXYzine (VISTARIL) 25 MG capsule Take 25 mg by mouth 2 (two) times daily as needed. 01/27/20   [provider]  LANTUS SOLOSTAR 100 UNIT/ML Solostar Pen Inject 20 Units into the skin 2 (two) times daily. 01/27/20   [provider]  loratadine (CLARITIN) 10 MG tablet Take 1 tablet (10 mg total) by mouth daily. 01/14/13   Ghimire, 03/28/20, MD  losartan (COZAAR) 100 MG tablet Take 100 mg by mouth daily.    [provider]  magnesium 30 MG tablet Take 1 tablet (30 mg total) by mouth daily.  02/15/20   Terrilee FilesButler, Michael C, MD  metFORMIN (GLUCOPHAGE) 1000 MG tablet Take 1,000 mg by mouth 2 (two) times daily with a meal.    [provider]  MIRALAX 17 GM/SCOOP powder Take 17 g by mouth at bedtime. 01/27/20   [provider]  montelukast (SINGULAIR) 10 MG tablet Take 1 tablet (10 mg total) by mouth at bedtime. 10/04/12   Johnson, Clanford L, MD  NAPROSYN 500 MG tablet Take 500 mg by mouth 2 (two) times daily as needed. 01/27/20   [provider]  omeprazole (PRILOSEC) 20 MG capsule Take 20 mg by mouth daily.    [provider]  polyethylene glycol (MIRALAX / GLYCOLAX) packet Take 17 g by mouth daily as needed for mild constipation.    [provider]    Allergies    Patient has no known allergies.  Review of Systems   Review of Systems  Unable to perform ROS: Mental status change    Physical Exam Updated Vital Signs BP 135/88 (BP Location: Right Arm)   Pulse 75   Temp 97.8 F (36.6 C) (Oral)   Resp 13   SpO2 100%   Physical Exam Vitals and nursing note reviewed.  Constitutional:      General: She is not in acute distress.    Appearance: She is not ill-appearing.     Interventions: Cervical collar in place.  HENT:     Head: Normocephalic and atraumatic. No raccoon eyes or Battle's sign.     Jaw: There is normal jaw occlusion.     Right Ear: Tympanic membrane and external ear normal. No hemotympanum.     Left Ear: Tympanic membrane and external ear normal. No hemotympanum.     Nose: Nose normal. No nasal tenderness.     Right Nostril: No epistaxis or septal hematoma.     Left Nostril: No epistaxis or septal hematoma.     Mouth/Throat:     Mouth: Mucous membranes are moist.     Pharynx: Oropharynx is clear.  Eyes:     General: No scleral icterus.       Right eye: No discharge.        Left eye: No discharge.     Extraocular Movements: Extraocular movements intact.     Conjunctiva/sclera: Conjunctivae normal.     Pupils: Pupils are equal, round, and reactive to light.  Neck:     Vascular: No JVD.  Cardiovascular:     Rate and Rhythm: Normal rate and regular rhythm.     Pulses: Normal pulses.          Radial pulses are 2+ on the right side and 2+ on the left side.     Heart sounds: Normal heart sounds.  Pulmonary:     Comments: Lungs clear to auscultation in all fields. Symmetric chest rise. No wheezing, rales, or rhonchi. Abdominal:     General: Bowel sounds are normal.     Comments: Abdomen is soft, non-distended, nontender in all quadrants when patient is distracted. No rigidity, no guarding. No peritoneal signs.  Musculoskeletal:        General: Normal range of motion.     Right lower leg: No  edema.     Left lower leg: No edema.  Skin:    General: Skin is warm and dry.     Capillary Refill: Capillary refill takes less than 2 seconds.  Neurological:     GCS: GCS eye subscore is 4. GCS verbal subscore is 5.  GCS motor subscore is 6.     Comments: Fluent speech, no facial droop.  Patient does not follow commands.  Strong and equal grip strength in bilateral upper and lower extremities.  Sensation is intact.  Psychiatric:        Behavior: Behavior normal.     ED Results / Procedures / Treatments   Labs (all labs ordered are listed, but only abnormal results are displayed) Labs Reviewed  COMPREHENSIVE METABOLIC PANEL - Abnormal; Notable for the following components:      Result Value   CO2 18 (*)    Glucose, Bld 167 (*)    BUN 62 (*)    Creatinine, Ser 2.12 (*)    Total Protein 8.2 (*)    GFR calc non Af Amer 25 (*)    GFR calc Af Amer 29 (*)    All other components within normal limits  CBC WITH DIFFERENTIAL/PLATELET - Abnormal; Notable for the following components:   WBC 12.1 (*)    Platelets 443 (*)    Neutro Abs 8.7 (*)    All other components within normal limits  URINALYSIS, COMPLETE (UACMP) WITH MICROSCOPIC - Abnormal; Notable for the following components:   Hgb urine dipstick SMALL (*)    Ketones, ur 15 (*)    Protein, ur 30 (*)    Bacteria, UA RARE (*)    All other components within normal limits  AMMONIA - Abnormal; Notable for the following components:   Ammonia 79 (*)    All other components within normal limits  MAGNESIUM - Abnormal; Notable for the following components:   Magnesium 2.5 (*)    All other components within normal limits  CBG MONITORING, ED - Abnormal; Notable for the following components:   Glucose-Capillary 164 (*)    All other components within normal limits  I-STAT BETA HCG BLOOD, ED ([facility], [facility], [facility] ONLY) - Abnormal; Notable for the following components:   I-stat hCG, quantitative 7.3 (*)    All other components within normal limits   I-STAT ARTERIAL BLOOD GAS, ED - Abnormal; Notable for the following components:   pCO2 arterial 27.1 (*)    Bicarbonate 17.8 (*)    TCO2 19 (*)    Acid-base deficit 5.0 (*)    Sodium 149 (*)    All other components within normal limits  SARS CORONAVIRUS 2 BY RT PCR (HOSPITAL ORDER, PERFORMED IN [facility])  ETHANOL  RAPID URINE DRUG SCREEN, HOSP PERFORMED  LACTIC ACID, PLASMA  CBG MONITORING, ED  TROPONIN I (HIGH SENSITIVITY)  TROPONIN I (HIGH SENSITIVITY)    EKG EKG Interpretation  Date/Time:  Sunday February 29 2020 20:42:45 EDT Ventricular Rate:  73 PR Interval:    QRS Duration: 98 QT Interval:  409 QTC Calculation: 451 R Axis:   -36 Text Interpretation: Sinus rhythm Left axis deviation Nonspecific T abnormalities, lateral leads similar to Jun 2021 Confirmed by Pricilla Loveless [phone]) on 02/29/2020 8:44:53 PM   Radiology CT HEAD WO CONTRAST  Result Date: 02/29/2020 CLINICAL DATA:  Fall onto concrete, nausea, vomiting and urinary incontinence EXAM: CT HEAD WITHOUT CONTRAST CT CERVICAL SPINE WITHOUT CONTRAST TECHNIQUE: Multidetector CT imaging of the head and cervical spine was performed following the standard protocol without intravenous contrast. Multiplanar CT image reconstructions of the cervical spine were also generated. COMPARISON:  MRI 09/15/2006 FINDINGS: CT HEAD FINDINGS Brain: No evidence of acute infarction, hemorrhage, hydrocephalus, extra-axial collection or mass lesion/mass effect. Vascular: Atherosclerotic calcification of the carotid siphons. No hyperdense vessel. Skull:  No calvarial fracture or suspicious osseous lesion. No scalp swelling or hematoma. Sinuses/Orbits: Paranasal sinuses and mastoid air cells are predominantly clear. Included orbital structures are unremarkable. Other: Edentulous CT CERVICAL SPINE FINDINGS Alignment: Stabilization collar in place at the time of examination. Mild leftward neck flexion straightening of the normal cervical  lordosis at the upper cervical levels. Mild retrolisthesis of C5 on C6 favored to be on a degenerative basis with 3 mm of posterior translation. No evidence of traumatic listhesis. No abnormally widened, perched or jumped facets. Normal alignment of the craniocervical and atlantoaxial articulations. Skull base and vertebrae: No visible skull base fractures. No vertebral fracture or vertebral body height loss. Moderate arthrosis at the atlantodental interval. Multilevel spondylitic and facet degenerative changes. Normal bone mineralization. No worrisome osseous lesions. Soft tissues and spinal canal: No pre or paravertebral fluid or swelling. No visible canal hematoma. Disc levels: Multilevel intervertebral disc height loss with spondylitic endplate changes. Larger disc osteophyte complexes at C3-4, C4-5, C5-6 result in mild canal stenosis. No severe canal narrowing. Multilevel uncinate spurring and facet hypertrophic changes are present as well resulting in mild-to-moderate foraminal narrowing C4-C7. Upper chest: No acute abnormality in the upper chest or imaged lung apices. Some mild bilateral pleuroparenchymal scarring. Other: Cervical carotid atherosclerosis. No concerning thyroid nodules. IMPRESSION: 1. No acute intracranial abnormality. No calvarial fracture or scalp swelling. 2. No acute fracture or traumatic listhesis of the cervical spine. 3. Multilevel degenerative changes of the cervical spine as described above. 4. Cervical and intracranial atherosclerosis. Electronically Signed   By: Kreg Shropshire M.D.   On: 02/29/2020 19:38   CT CERVICAL SPINE WO CONTRAST  Result Date: 02/29/2020 CLINICAL DATA:  Fall onto concrete, nausea, vomiting and urinary incontinence EXAM: CT HEAD WITHOUT CONTRAST CT CERVICAL SPINE WITHOUT CONTRAST TECHNIQUE: Multidetector CT imaging of the head and cervical spine was performed following the standard protocol without intravenous contrast. Multiplanar CT image reconstructions of  the cervical spine were also generated. COMPARISON:  MRI 09/15/2006 FINDINGS: CT HEAD FINDINGS Brain: No evidence of acute infarction, hemorrhage, hydrocephalus, extra-axial collection or mass lesion/mass effect. Vascular: Atherosclerotic calcification of the carotid siphons. No hyperdense vessel. Skull: No calvarial fracture or suspicious osseous lesion. No scalp swelling or hematoma. Sinuses/Orbits: Paranasal sinuses and mastoid air cells are predominantly clear. Included orbital structures are unremarkable. Other: Edentulous CT CERVICAL SPINE FINDINGS Alignment: Stabilization collar in place at the time of examination. Mild leftward neck flexion straightening of the normal cervical lordosis at the upper cervical levels. Mild retrolisthesis of C5 on C6 favored to be on a degenerative basis with 3 mm of posterior translation. No evidence of traumatic listhesis. No abnormally widened, perched or jumped facets. Normal alignment of the craniocervical and atlantoaxial articulations. Skull base and vertebrae: No visible skull base fractures. No vertebral fracture or vertebral body height loss. Moderate arthrosis at the atlantodental interval. Multilevel spondylitic and facet degenerative changes. Normal bone mineralization. No worrisome osseous lesions. Soft tissues and spinal canal: No pre or paravertebral fluid or swelling. No visible canal hematoma. Disc levels: Multilevel intervertebral disc height loss with spondylitic endplate changes. Larger disc osteophyte complexes at C3-4, C4-5, C5-6 result in mild canal stenosis. No severe canal narrowing. Multilevel uncinate spurring and facet hypertrophic changes are present as well resulting in mild-to-moderate foraminal narrowing C4-C7. Upper chest: No acute abnormality in the upper chest or imaged lung apices. Some mild bilateral pleuroparenchymal scarring. Other: Cervical carotid atherosclerosis. No concerning thyroid nodules. IMPRESSION: 1. No acute intracranial  abnormality. No calvarial fracture  or scalp swelling. 2. No acute fracture or traumatic listhesis of the cervical spine. 3. Multilevel degenerative changes of the cervical spine as described above. 4. Cervical and intracranial atherosclerosis. Electronically Signed   By: Kreg Shropshire M.D.   On: 02/29/2020 19:38    Procedures .Critical Care Performed by: Sherene Sires, PA-C Authorized by: Sherene Sires, PA-C   Critical care provider statement:    Critical care time (minutes):  32   Critical care time was exclusive of:  Separately billable procedures and treating other patients and teaching time   Critical care was necessary to treat or prevent imminent or life-threatening deterioration of the following conditions:  Metabolic crisis   Critical care was time spent personally by me on the following activities:  Development of treatment plan with patient or surrogate, discussions with consultants, evaluation of patient's response to treatment, examination of patient, ordering and performing treatments and interventions, ordering and review of laboratory studies, ordering and review of radiographic studies, pulse oximetry, re-evaluation of patient's condition, review of old charts and obtaining history from patient or surrogate   I assumed direction of critical care for this patient from another provider in my specialty: no     (including critical care time)  Medications Ordered in ED Medications  sodium chloride 0.9 % bolus 1,000 mL (0 mLs Intravenous Stopped 02/29/20 2016)    And  0.9 %  sodium chloride infusion ( Intravenous New Bag/Given 02/29/20 2118)  lactulose (CHRONULAC) 10 GM/15ML solution 20 g (has no administration in time range)    ED Course  I have reviewed the triage vital signs and the nursing notes.  Pertinent labs & imaging results that were available during my care of the patient were reviewed by me and considered in my medical decision making (see chart for  details).  Clinical Course as of Mar 02 3  Wynelle Link Feb 29, 2020  1901 CBG on arrival 164  Glucose-Capillary(!): 164 [KA]    Clinical Course User Index [KA] Layaan Mott, Mliss Sax   MDM Rules/Calculators/A&P                          History provided by EMS with additional history obtained from chart review.    Patient seen and examined. Patient presents awake, alert, hemodynamically stable, afebrile, non toxic. She is altered and unable provide history.  She knows her name and that she is in [place] however does not know the year or her birthdate.  Every question I ask she answers with "what is that?"  She is unable to follow commands.  She does tell me she has abdominal pain however when distracted abdomen is nontender, no peritoneal signs.  Normal active bowel sounds.  CBG on arrival was 164.  She is hemodynamically stable.  EKG without STEMI. Altered mental status work-up initiated.  CT head and cervical spine are negative for any acute traumatic injuries or intracranial abnormalities.  Cervical collar removed.  CBC shows nonspecific leukocytosis with white count of 12.4, no anemia.  CMP shows bicarb is low at 18 with a normal anion gap.  She does appear dry with BUN/creatinine at 2.12 which appears higher than her baseline.  Chart review shows x2 weeks ago BUN/creatinine was 25/1.8.  Given 2 liter of IV fluids.  I-STAT beta-hCG is 7.3, negligible result. Ammonia is elevated at 79.  Ethanol level is negative.  UA without infection.  UDS negative.  Lactic acid within normal range.  This  case was discussed with Dr. Criss Alvine who has seen the patient and agrees with plan to admit.  Spoke with Dr. Thomes Dinning with hospitalist service who agrees to assume care of patient and bring into the hospital for further evaluation and management.  He requests lactulose be ordered given her elevated ammonia, order placed.   Portions of this note were generated with Scientist, clinical (histocompatibility and immunogenetics). Dictation errors  may occur despite best attempts at proofreading.    Final Clinical Impression(s) / ED Diagnoses Final diagnoses:  AKI (acute kidney injury) (HCC)  Dehydration  Altered mental status, unspecified altered mental status type    Rx / DC Orders ED Discharge Orders    None       Sherene Sires, PA-C 03/01/20 0005    Pricilla Loveless, MD 03/01/20 [phone]

## 2020-02-29 NOTE — ED Triage Notes (Addendum)
Pt here from [facility] via [facility] for a fall onto concrete. Unknown if pt had LOC, no obvious injuries, pt in  c-collar. Per EMS, pt had N/V and was incontinent of urine, initial BP 102/70. Pt received 500 ml NS, after BP 137/87. At baseline pt aox4, now aox2. C/o abd pain.

## 2020-03-01 ENCOUNTER — Observation Stay (HOSPITAL_COMMUNITY): Payer: Self-pay

## 2020-03-01 ENCOUNTER — Encounter (HOSPITAL_COMMUNITY): Payer: Self-pay | Admitting: Family Medicine

## 2020-03-01 ENCOUNTER — Inpatient Hospital Stay (HOSPITAL_COMMUNITY): Payer: Self-pay

## 2020-03-01 DIAGNOSIS — W19XXXA Unspecified fall, initial encounter: Secondary | ICD-10-CM

## 2020-03-01 DIAGNOSIS — E349 Endocrine disorder, unspecified: Secondary | ICD-10-CM

## 2020-03-01 DIAGNOSIS — N179 Acute kidney failure, unspecified: Secondary | ICD-10-CM

## 2020-03-01 DIAGNOSIS — R4182 Altered mental status, unspecified: Secondary | ICD-10-CM | POA: Diagnosis present

## 2020-03-01 DIAGNOSIS — R1110 Vomiting, unspecified: Secondary | ICD-10-CM

## 2020-03-01 DIAGNOSIS — N189 Chronic kidney disease, unspecified: Secondary | ICD-10-CM

## 2020-03-01 DIAGNOSIS — E7220 Disorder of urea cycle metabolism, unspecified: Secondary | ICD-10-CM

## 2020-03-01 DIAGNOSIS — I10 Essential (primary) hypertension: Secondary | ICD-10-CM

## 2020-03-01 DIAGNOSIS — E860 Dehydration: Secondary | ICD-10-CM

## 2020-03-01 DIAGNOSIS — E785 Hyperlipidemia, unspecified: Secondary | ICD-10-CM

## 2020-03-01 DIAGNOSIS — J45909 Unspecified asthma, uncomplicated: Secondary | ICD-10-CM

## 2020-03-01 DIAGNOSIS — G9340 Encephalopathy, unspecified: Secondary | ICD-10-CM | POA: Diagnosis present

## 2020-03-01 LAB — CBC
HCT: 37.8 % (ref 36.0–46.0)
Hemoglobin: 12.5 g/dL (ref 12.0–15.0)
MCH: 29.7 pg (ref 26.0–34.0)
MCHC: 33.1 g/dL (ref 30.0–36.0)
MCV: 89.8 fL (ref 80.0–100.0)
Platelets: 437 10*3/uL — ABNORMAL HIGH (ref 150–400)
RBC: 4.21 MIL/uL (ref 3.87–5.11)
RDW: 13.2 % (ref 11.5–15.5)
WBC: 11 10*3/uL — ABNORMAL HIGH (ref 4.0–10.5)
nRBC: 0 % (ref 0.0–0.2)

## 2020-03-01 LAB — COMPREHENSIVE METABOLIC PANEL
ALT: 29 U/L (ref 0–44)
AST: 27 U/L (ref 15–41)
Albumin: 3.9 g/dL (ref 3.5–5.0)
Alkaline Phosphatase: 73 U/L (ref 38–126)
Anion gap: 11 (ref 5–15)
BUN: 48 mg/dL — ABNORMAL HIGH (ref 6–20)
CO2: 21 mmol/L — ABNORMAL LOW (ref 22–32)
Calcium: 9.6 mg/dL (ref 8.9–10.3)
Chloride: 116 mmol/L — ABNORMAL HIGH (ref 98–111)
Creatinine, Ser: 1.59 mg/dL — ABNORMAL HIGH (ref 0.44–1.00)
GFR calc Af Amer: 41 mL/min — ABNORMAL LOW (ref >60)
GFR calc non Af Amer: 36 mL/min — ABNORMAL LOW (ref >60)
Glucose, Bld: 145 mg/dL — ABNORMAL HIGH (ref 70–99)
Potassium: 3.3 mmol/L — ABNORMAL LOW (ref 3.5–5.1)
Sodium: 148 mmol/L — ABNORMAL HIGH (ref 135–145)
Total Bilirubin: 0.7 mg/dL (ref 0.3–1.2)
Total Protein: 7.8 g/dL (ref 6.5–8.1)

## 2020-03-01 LAB — AMMONIA: Ammonia: 14 umol/L (ref 9–35)

## 2020-03-01 LAB — GLUCOSE, CAPILLARY
Glucose-Capillary: 126 mg/dL — ABNORMAL HIGH (ref 70–99)
Glucose-Capillary: 129 mg/dL — ABNORMAL HIGH (ref 70–99)
Glucose-Capillary: 107 mg/dL — ABNORMAL HIGH (ref 70–99)
Glucose-Capillary: 150 mg/dL — ABNORMAL HIGH (ref 70–99)
Glucose-Capillary: 144 mg/dL — ABNORMAL HIGH (ref 70–99)

## 2020-03-01 LAB — HEMOGLOBIN A1C
Hgb A1c MFr Bld: 6.2 % — ABNORMAL HIGH (ref 4.8–5.6)
Mean Plasma Glucose: 131.24 mg/dL

## 2020-03-01 LAB — APTT: aPTT: 28 seconds (ref 24–36)

## 2020-03-01 LAB — FOLATE: Folate: 5.8 ng/mL — ABNORMAL LOW (ref >5.9)

## 2020-03-01 LAB — VITAMIN B12: Vitamin B-12: 447 pg/mL (ref 180–914)

## 2020-03-01 LAB — CBG MONITORING, ED: Glucose-Capillary: 125 mg/dL — ABNORMAL HIGH (ref 70–99)

## 2020-03-01 LAB — PROTIME-INR
INR: 1.2 (ref 0.8–1.2)
Prothrombin Time: 14.7 seconds (ref 11.4–15.2)

## 2020-03-01 LAB — HIV ANTIBODY (ROUTINE TESTING W REFLEX): HIV Screen 4th Generation wRfx: NONREACTIVE

## 2020-03-01 LAB — SARS CORONAVIRUS 2 BY RT PCR (HOSPITAL ORDER, PERFORMED IN ~~LOC~~ HOSPITAL LAB): SARS Coronavirus 2: NEGATIVE

## 2020-03-01 MED ORDER — LEVETIRACETAM IN NACL 500 MG/100ML IV SOLN
500.0000 mg | Freq: Two times a day (BID) | INTRAVENOUS | Status: DC
  Administered 2020-03-02 – 2020-03-03 (×3): 500 mg via INTRAVENOUS
  Filled 2020-03-01 (×4): qty 100

## 2020-03-01 MED ORDER — IOHEXOL 9 MG/ML PO SOLN
ORAL | Status: AC
  Filled 2020-03-01: qty 500

## 2020-03-01 MED ORDER — LOSARTAN POTASSIUM 50 MG PO TABS
100.0000 mg | ORAL_TABLET | Freq: Every day | ORAL | Status: DC
  Administered 2020-03-01: 100 mg via ORAL
  Filled 2020-03-01: qty 2

## 2020-03-01 MED ORDER — INSULIN ASPART 100 UNIT/ML ~~LOC~~ SOLN: 0.0000 [IU] | Freq: Every day | SUBCUTANEOUS | Status: DC

## 2020-03-01 MED ORDER — MONTELUKAST SODIUM 10 MG PO TABS
10.0000 mg | ORAL_TABLET | Freq: Every day | ORAL | Status: DC
  Administered 2020-03-01: 10 mg via ORAL
  Filled 2020-03-01 (×3): qty 1

## 2020-03-01 MED ORDER — ASPIRIN EC 81 MG PO TBEC
81.0000 mg | DELAYED_RELEASE_TABLET | Freq: Every day | ORAL | Status: DC
  Administered 2020-03-02 – 2020-03-04 (×3): 81 mg via ORAL
  Filled 2020-03-01 (×4): qty 1

## 2020-03-01 MED ORDER — MOMETASONE FURO-FORMOTEROL FUM 200-5 MCG/ACT IN AERO
2.0000 | INHALATION_SPRAY | Freq: Two times a day (BID) | RESPIRATORY_TRACT | Status: DC
  Administered 2020-03-01 – 2020-03-03 (×3): 2 via RESPIRATORY_TRACT
  Filled 2020-03-01: qty 8.8

## 2020-03-01 MED ORDER — SODIUM CHLORIDE 0.9 % IV SOLN: Freq: Once | INTRAVENOUS | Status: AC

## 2020-03-01 MED ORDER — ONDANSETRON HCL 4 MG PO TABS: 4.0000 mg | ORAL_TABLET | Freq: Four times a day (QID) | ORAL | Status: DC | PRN

## 2020-03-01 MED ORDER — ATORVASTATIN CALCIUM 40 MG PO TABS
40.0000 mg | ORAL_TABLET | Freq: Every day | ORAL | Status: DC
  Administered 2020-03-01 (×2): 40 mg via ORAL
  Filled 2020-03-01 (×4): qty 1

## 2020-03-01 MED ORDER — LEVETIRACETAM IN NACL 500 MG/100ML IV SOLN
500.0000 mg | Freq: Once | INTRAVENOUS | Status: AC
  Administered 2020-03-01: 500 mg via INTRAVENOUS
  Filled 2020-03-01: qty 100

## 2020-03-01 MED ORDER — INSULIN ASPART 100 UNIT/ML ~~LOC~~ SOLN
0.0000 [IU] | Freq: Three times a day (TID) | SUBCUTANEOUS | Status: DC
  Administered 2020-03-01: 1 [IU] via SUBCUTANEOUS
  Administered 2020-03-02: 2 [IU] via SUBCUTANEOUS
  Administered 2020-03-03 (×3): 1 [IU] via SUBCUTANEOUS

## 2020-03-01 MED ORDER — LACTATED RINGERS IV SOLN: INTRAVENOUS | Status: AC

## 2020-03-01 MED ORDER — PANTOPRAZOLE SODIUM 40 MG PO TBEC
40.0000 mg | DELAYED_RELEASE_TABLET | Freq: Every day | ORAL | Status: DC
  Administered 2020-03-01 – 2020-03-04 (×4): 40 mg via ORAL
  Filled 2020-03-01 (×4): qty 1

## 2020-03-01 MED ORDER — ONDANSETRON HCL 4 MG/2ML IJ SOLN
4.0000 mg | Freq: Four times a day (QID) | INTRAMUSCULAR | Status: DC | PRN
  Administered 2020-03-01 – 2020-03-02 (×2): 4 mg via INTRAVENOUS
  Filled 2020-03-01 (×2): qty 2

## 2020-03-01 MED ORDER — ALBUTEROL SULFATE HFA 108 (90 BASE) MCG/ACT IN AERS
2.0000 | INHALATION_SPRAY | Freq: Four times a day (QID) | RESPIRATORY_TRACT | Status: DC | PRN
  Filled 2020-03-01: qty 6.7

## 2020-03-01 MED ORDER — ALBUTEROL SULFATE (2.5 MG/3ML) 0.083% IN NEBU: 2.5000 mg | INHALATION_SOLUTION | Freq: Four times a day (QID) | RESPIRATORY_TRACT | Status: DC | PRN

## 2020-03-01 MED ORDER — LACTULOSE 10 GM/15ML PO SOLN
20.0000 g | Freq: Every day | ORAL | Status: DC
  Administered 2020-03-01 – 2020-03-04 (×4): 20 g via ORAL
  Filled 2020-03-01 (×4): qty 30

## 2020-03-01 MED ORDER — FOLIC ACID 1 MG PO TABS
1.0000 mg | ORAL_TABLET | Freq: Every day | ORAL | Status: DC
  Administered 2020-03-02 – 2020-03-04 (×3): 1 mg via ORAL
  Filled 2020-03-01 (×4): qty 1

## 2020-03-01 NOTE — Plan of Care (Signed)
  Problem: Nutrition: Goal: Adequate nutrition will be maintained Outcome: Progressing   Problem: Activity: Goal: Risk for activity intolerance will decrease Outcome: Progressing   

## 2020-03-01 NOTE — Consult Note (Addendum)
Neurology Consultation  Reason for Consult: Altered mental status and question of seizure Referring Physician: Zigmund Daniel., MD  CC: Altered mental status and question of seizure  History is obtained from: Notes and sisters who are at bedside  HPI: Tamara [name] is a 59 y.o. female with a 12 grade education who has been noted by sisters to have a slow decline in her memory.  She has been having more difficulties expressing herself and naming objects.  Per sister she does live alone and is able to do majority of her ADLs however her daughter does her billing.  They do not know any history of seizures, history of trauma, history of febrile seizures and or abnormal birth.  Per H&P, EMS was called out to [facility].  On arrival they found patient lying on the pavement next to a passengers car door, she had an episode of emesis and was incontinent of urine.  CBG was 106 and blood pressure was 102/70.  Patient's blood pressure improved with normal saline.  Unfortunately the cause of her fall was not witnessed.  When talking to the patient today although her history is very patchy, she does recall going to [facility] and can really not recall anything else other than EMS at her side.  There is no known downtime.  Currently she has stated above is very drowsy and very encephalopathic.  Further discussion was had with her sisters who state although she is having memory problems this is not her baseline.  She is also very much slower in her thought process than usual.  The question to neurology was if this might have been a seizure which was unwitnessed.   Past Medical History:  Diagnosis Date  . Memory changes     Family History  Problem Relation Age of Onset  . Hypertension Mother   . Hypertension Father    Social History:   has no history on file for tobacco use, alcohol use, and drug use.  Medications  Current Facility-Administered Medications:  .  albuterol (PROVENTIL) (2.5 MG/3ML)  0.083% nebulizer solution 2.5 mg, 2.5 mg, Inhalation, Q6H PRN, Zigmund Daniel., MD .  aspirin EC tablet 81 mg, 81 mg, Oral, Daily, Zigmund Daniel., MD .  atorvastatin (LIPITOR) tablet 40 mg, 40 mg, Oral, QHS, Adefeso, Oladapo, DO, 40 mg at 03/01/20 6754 .  folic acid (FOLVITE) tablet 1 mg, 1 mg, Oral, Daily, Lowell Guitar, A Charlyn Minerva., MD .  insulin aspart (novoLOG) injection 0-5 Units, 0-5 Units, Subcutaneous, QHS, Adefeso, Oladapo, DO .  insulin aspart (novoLOG) injection 0-9 Units, 0-9 Units, Subcutaneous, TID WC, Adefeso, Oladapo, DO, 1 Units at 03/01/20 0704 .  iohexol (OMNIPAQUE) 9 MG/ML oral solution, , , ,  .  lactated ringers infusion, , Intravenous, Continuous, Zigmund Daniel., MD, Last Rate: 100 mL/hr at 03/01/20 1240, New Bag at 03/01/20 1240 .  lactulose (CHRONULAC) 10 GM/15ML solution 20 g, 20 g, Oral, Daily, Zigmund Daniel., MD, 20 g at 03/01/20 1240 .  mometasone-formoterol (DULERA) 200-5 MCG/ACT inhaler 2 puff, 2 puff, Inhalation, BID, Adefeso, Oladapo, DO, 2 puff at 03/01/20 1213 .  montelukast (SINGULAIR) tablet 10 mg, 10 mg, Oral, QHS, Powell, A Charlyn Minerva., MD .  ondansetron [facility]) tablet 4 mg, 4 mg, Oral, Q6H PRN **OR** ondansetron (ZOFRAN) injection 4 mg, 4 mg, Intravenous, Q6H PRN, Adefeso, Oladapo, DO .  pantoprazole (PROTONIX) EC tablet 40 mg, 40 mg, Oral, Daily, Adefeso, Oladapo, DO, 40 mg at 03/01/20 4920  ROS: Could not get full range of system as patient was not cooperative in answering many questions however she did complain of chest discomfort and also abdominal pain.  Exam: Current vital signs: BP 132/82 (BP Location: Left Arm)   Pulse 73   Temp 97.7 F (36.5 C) (Oral)   Resp 16   Ht 5\' 7"  (1.702 m)   Wt 69.1 kg   SpO2 100%   BMI 23.86 kg/m  Vital signs in last 24 hours: Temp:  [97.4 F (36.3 C)-98.6 F (37 C)] 97.7 F (36.5 C) (07/05 1604) Pulse Rate:  [66-79] 73 (07/05 1604) Resp:  [10-18] 16 (07/05 1604) BP:  (123-146)/(82-106) 132/82 (07/05 1604) SpO2:  [99 %-100 %] 100 % (07/05 1604) Weight:  [69.1 kg] 69.1 kg (07/05 0650)   Constitutional: Appears well-developed and well-nourished.  Psych: Affect appropriate to situation Eyes: No scleral injection HENT: No OP obstrucion Head: Normocephalic.  Cardiovascular: Normal rate and regular rhythm.  Respiratory: Effort normal, non-labored breathing GI: Soft.  No distension. There is no tenderness.  Skin: WDI  Neuro: Mental Status: Patient is awake, does not know her age, does not know where she is, could not answer correctly the year but did get July.  Was able to state her sisters were at her bedside but got the names wrong.  Appears very drowsy.  Unable to name objects, comprehend what is being asked of her.  When asked a question she quickly reverts to "I do not know".  Cranial Nerves: II: Visual Fields are full.  III,IV, VI: EOMI without ptosis or diploplia. Pupils equal, round and reactive to light V: Facial sensation is symmetric to temperature VII: Facial movement is symmetric.  VIII: hearing is intact to voice X: Palat elevates symmetrically XI: Shoulder shrug is symmetric. XII: tongue is midline without atrophy or fasciculations.  Motor: Bilateral arms able to hold antigravity.  There may be some very slight asterixis.  Attempted to get patient to lift legs off bed however she would not secondary to "abdominal pain". Sensory: Sensation is symmetric to light touch and temperature in the arms and legs. Deep Tendon Reflexes: 2+ at brachioradialis and bicep.  I could not elicit knee jerks secondary to patient not relaxing. Plantars: Toes are downgoing bilaterally.  Cerebellar: Patient would not take part in heel-to-shin and/or finger-nose-finger.  Labs I have reviewed labs in epic and the results pertinent to this consultation are:   CBC    Component Value Date/Time   WBC 11.0 (H) 03/01/2020 0724   RBC 4.21 03/01/2020 0724    HGB 12.5 03/01/2020 0724   HCT 37.8 03/01/2020 0724   PLT 437 (H) 03/01/2020 0724   MCV 89.8 03/01/2020 0724   MCH 29.7 03/01/2020 0724   MCHC 33.1 03/01/2020 0724   RDW 13.2 03/01/2020 0724   LYMPHSABS 2.8 02/29/2020 1839   MONOABS 0.6 02/29/2020 1839   EOSABS 0.0 02/29/2020 1839   BASOSABS 0.0 02/29/2020 1839    CMP     Component Value Date/Time   NA 148 (H) 03/01/2020 0724   K 3.3 (L) 03/01/2020 0724   CL 116 (H) 03/01/2020 0724   CO2 21 (L) 03/01/2020 0724   GLUCOSE 145 (H) 03/01/2020 0724   BUN 48 (H) 03/01/2020 0724   CREATININE 1.59 (H) 03/01/2020 0724   CALCIUM 9.6 03/01/2020 0724   PROT 7.8 03/01/2020 0724   ALBUMIN 3.9 03/01/2020 0724   AST 27 03/01/2020 0724   ALT 29 03/01/2020 0724   ALKPHOS 73 03/01/2020 0724  BILITOT 0.7 03/01/2020 0724   GFRNONAA 36 (L) 03/01/2020 0724   GFRAA 41 (L) 03/01/2020 0724    Lipid Panel     Component Value Date/Time   CHOL 241 (H) 11/04/2018 0029   TRIG 438 (H) 11/04/2018 0029   HDL 30 (L) 11/04/2018 0029   CHOLHDL 8.0 11/04/2018 0029   VLDL UNABLE TO CALCULATE IF TRIGLYCERIDE OVER 400 mg/dL 51/76/1607 3710   LDLCALC UNABLE TO CALCULATE IF TRIGLYCERIDE OVER 400 mg/dL 62/69/4854 6270     Imaging I have reviewed the images obtained:  CT-scan of the brain-no acute intracranial abnormality.  No calvarial fracture or scalp swelling.  No acute fracture or traumatic listhesis of the spine.  MRI examination of the brain-asymmetric atrophy with associated FLAIR signal abnormality involving the anterior and middle left temporal lobe, of uncertain etiology, but suggesting a chronic process, possibly related to underlying seizure disorder.  No other acute intracranial abnormalities  EEG IMPRESSION: Normal electroencephalogram, awake, drowsy and sleep. There are no focal lateralizing or epileptiform features.   Felicie Morn PA-C [facility] [phone]  M-F  (9:00 am- 5:00 PM)  03/01/2020, 4:19 PM      Assessment:  This is a 59 year old female with no history of seizures or head trauma in the past, found down at [facility] unfortunately no witnesses and unknown time patient was down.  On exam today patient remains encephalopathic and has difficulty following commands.  MRI did show asymmetrical atrophy with associated FLAIR signal abnormal region involving the anterior and middle left temporal lobe which may be the focus for possible seizure.   Impression: -Most likely seizure  Recommendations: -Given MRI findings and history at this point time feel it would be most appropriate to start patient on an antiepileptic such as Keppra 500 mg twice daily and follow-up with neurologist as outpatient.  Dr. Amada Jupiter to follow  I have seen the patient and reviewed the above note.  She is lethargic, but oriented to month though she does not know me the year.  She is able to follow commands readily, just seems tired and with an increased latency response.  The description of the events seems most consistent with partial seizure.  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  Though she does not give me a history of transient episodes of aphasia, time loss, or other concerning episodes, the fact that her memory has been worsening does make me wonder if she has been having seizures intermittently even prior to this.  I would favor starting her on Keppra, if she is not improving by tomorrow, would consider LTM EEG.  Ritta Slot, MD [facility] [phone]  If 7pm- 7am, please page neurology on call as listed in AMION.

## 2020-03-01 NOTE — Progress Notes (Signed)
PROGRESS NOTE    Tamara [name]  WJX:[id number] DOB: 1960/10/18 DOA: 02/29/2020 PCP: [name], NP   Chief Complaint  Patient presents with  . Fall  . Altered Mental Status    Brief Narrative:  Tamara [name] is Tamara [name] 59 y.o. female with medical history significant for type II DM, hyperlipidemia and asthma who presents to the emergency department via EMS due to altered mental status and unwitnessed fall.  Patient was unable to provide history regarding why she came to the ED, she states that she could not remember the details at this time.  History was obtained from ED PA and ED chart.  Per report, EMS was called to [facility], on arrival to the ED site, patient was laying on the pavement next Sakib Noguez passenger's side car door, she had an episode of emesis and was incontinent of urine.  It was reported that patient's neighbor was present but could not provide any history.  CBG was reported to be 106 and BP was 102/70 per EMS.  Patient was provided with IV NS of 500 mL in route to the ED with improvement in BP to 137/87.  Cervical collar was also applied in route.  ED Course: In the emergency department, she was hemodynamically stable.  Work-up in the ED showed mild leukocytosis, BUN/creatinine 62/2.12 (was 1.74-1.80 about 2 weeks ago).  Urinalysis was positive for ketonuria, ammonia was 79, UDS was negative, beta-hCG 7.3 (patient is menopausal), magnesium 2.5.  The cervical spine without contrast no acute intracranial abnormality and no acute fracture or traumatic listhesis of the cervical spine.  IV hydration of 1 L NS was given and patient was provided with lactulose 20 g x 1.  Hospitalist was asked to admit.  For further evaluation and management  Assessment & Plan:   Principal Problem:   Acute encephalopathy Active Problems:   Diabetes (HCC)   Dyslipidemia   Asthma   Dehydration   Acute kidney injury superimposed on CKD (HCC)   Hyperammonemia (HCC)   Elevated serum hCG    Hypermagnesemia   Fall   Vomiting   Essential hypertension  Acute metabolic encephalopathy  Patient found down with AMS - found on pavement next to car - emesis, incontinent - per triage note, typically pt is Rasa Degrazia&Ox4 - I've been unable to reach family to discuss baseline, but per chart, note from 6:39 on  7/5, pt only Erhardt Dada&Ox1 and this is sometimes her norm?  Will attempt to reach family for collateral.  Continues to have encephalopathy at this point, somewhat lethargic this AM Elevated ammonia on presentation, improved today, will continue lactulose MRI with asymmetric atrophy with associated flair signal abnormality involving the anterior and mid left temporal lobe of uncertain etiology, but suggesting chonic process EEG normal  B12 wnl, folate low, RPR pending UA not concerning for UTI secondary to multifactorial r/o seizures, CVA Neurology c/s, appreciate recs UDS was negative Continue fall precaution, aspiration precaution, seizure precaution and neuro checks Hold potentially sedating/deliriogenic meds - holding gabapentin  Unwitnessed fall possibly secondary to above Fall precaution and neuro checks  Folate Deficiency: replace  Nausea  Vomiting Unclear etiology, will continue to monitor  Continue IV Zofran 4mg  every 6 hours as needed  Dehydration Continue IV hydration  Acute kidney injury superimposed on CKD stage IIIb  Non Anion Gap Metabolic Acidosis  Hypernatremia Creatinine on admission=2.12  , this was 1.74-1.80 about 2 weeks ago.  NAGMA and hypernatremia likely related to NS resuscitation/CKD  Transition to LR, follow  Hold losartan Renally adjust medications, avoid nephrotoxic agents/dehydration/hypotension  Hypermagnesemia Mg 2.5; hold home magnesium tablets  Elevated beta-hCG hCG 7.3; patient is menopausal, unclear significance Follow outpatient   Type II Diabetes mellitus Holding home lantus for now Hold PO metformin Continue insulin sliding scale  and hypoglycemia protocol  Dyslipidemia Continue home atorvastatin   Asthma Continue Dulera, Ventolin and Singulair  Essential hypertension Hold losartan with AKI  Depression: wellbutrin on hold   DVT prophylaxis: SCD Code Status: full Family Communication: none at bedside - called listed contact, but he's not longer her sig other, will work on finding contact information for family Disposition:   Status is: Observation  The patient will require care spanning > 2 midnights and should be moved to inpatient because: Inpatient level of care appropriate due to severity of illness  Dispo: The patient is from: Home              Anticipated d/c is to: pending              Anticipated d/c date is: 1 day              Patient currently is not medically stable to d/c.  Consultants:   neurology  Procedures: EEG  IMPRESSION: Normal electroencephalogram, awake, drowsy and sleep. There are no focal lateralizing or epileptiform features.   Antimicrobials:  Anti-infectives (From admission, onward)   None      Subjective: Lethargic Knows she's at [place] Doesn't know why  Objective: Vitals:   03/01/20 0651 03/01/20 0729 03/01/20 0800 03/01/20 1141  BP:  132/87  133/87  Pulse:  70  70  Resp:  16  18  Temp:  (!) 97.4 F (36.3 C) 98 F (36.7 C) 97.8 F (36.6 C)  TempSrc:  Oral Axillary Oral  SpO2:  100%  99%  Weight:      Height: 5\' 7"  (1.702 m)       Intake/Output Summary (Last 24 hours) at 03/01/2020 1513 Last data filed at 03/01/2020 1126 Gross per 24 hour  Intake 2186.28 ml  Output --  Net 2186.28 ml   Filed Weights   03/01/20 0650  Weight: 69.1 kg    Examination:  General exam: Appears calm and comfortable  Respiratory system: Clear to auscultation. Respiratory effort normal. Cardiovascular system: S1 & S2 heard, RRR.  Gastrointestinal system: Abdomen is nondistended, soft and nontender.  Central nervous system: Lethargic, knows where she is,  not why.   Follows commands. No focal neurological deficits. Extremities: moving all extremities Skin: No rashes, lesions or ulcers Psychiatry: Judgement and insight appear normal. Mood & affect appropriate.     Data Reviewed: I have personally reviewed following labs and imaging studies  CBC: Recent Labs  Lab 02/29/20 1839 02/29/20 1859 03/01/20 0724  WBC 12.1*  --  11.0*  NEUTROABS 8.7*  --   --   HGB 13.0 12.2 12.5  HCT 40.1 36.0 37.8  MCV 89.5  --  89.8  PLT 443*  --  437*    Basic Metabolic Panel: Recent Labs  Lab 02/29/20 1839 02/29/20 1859 03/01/20 0724  NA 141 149* 148*  K 4.2 3.5 3.3*  CL 111  --  116*  CO2 18*  --  21*  GLUCOSE 167*  --  145*  BUN 62*  --  48*  CREATININE 2.12*  --  1.59*  CALCIUM 10.1  --  9.6  MG 2.5*  --   --     GFR: Estimated Creatinine Clearance:  38 mL/min ([place] Walle) (by C-G formula based on SCr of 1.59 mg/dL (H)).  Liver Function Tests: Recent Labs  Lab 02/29/20 1839 03/01/20 0724  AST 26 27  ALT 24 29  ALKPHOS 75 73  BILITOT 1.1 0.7  PROT 8.2* 7.8  ALBUMIN 4.3 3.9    CBG: Recent Labs  Lab 02/29/20 1826 03/01/20 0355 03/01/20 0631 03/01/20 0728 03/01/20 1208  GLUCAP 164* 125* 129* 126* 107*     Recent Results (from the past 240 hour(s))  SARS Coronavirus 2 by RT PCR (hospital order, performed in [facility] hospital lab) Nasopharyngeal Nasopharyngeal Swab     Status: None   Collection Time: 02/29/20 11:12 PM   Specimen: Nasopharyngeal Swab  Result Value Ref Range Status   SARS Coronavirus 2 NEGATIVE NEGATIVE Final    Comment: (NOTE) SARS-CoV-2 target nucleic acids are NOT DETECTED.  The SARS-CoV-2 RNA is generally detectable in upper and lower respiratory specimens during the acute phase of infection. The lowest concentration of SARS-CoV-2 viral copies this assay can detect is 250 copies / mL. Rakia Frayne negative result does not preclude SARS-CoV-2 infection and should not be used as the sole basis for treatment or  other patient management decisions.  Randy Castrejon negative result may occur with improper specimen collection / handling, submission of specimen other than nasopharyngeal swab, presence of viral mutation(s) within the areas targeted by this assay, and inadequate number of viral copies (<250 copies / mL). Nazar Kuan negative result must be combined with clinical observations, patient history, and epidemiological information.  Fact Sheet for Patients:   BoilerBrush.com.cy  Fact Sheet for Healthcare Providers: https://pope.com/  This test is not yet approved or  cleared by the [place] FDA and has been authorized for detection and/or diagnosis of SARS-CoV-2 by FDA under an Emergency Use Authorization (EUA).  This EUA will remain in effect (meaning this test can be used) for the duration of the COVID-19 declaration under Section 564(b)(1) of the Act, 21 U.S.C. section 360bbb-3(b)(1), unless the authorization is terminated or revoked sooner.  Performed at [facility], [address], [place], [place] [address]          Radiology Studies: CT HEAD WO CONTRAST  Result Date: 02/29/2020 CLINICAL DATA:  Fall onto concrete, nausea, vomiting and urinary incontinence EXAM: CT HEAD WITHOUT CONTRAST CT CERVICAL SPINE WITHOUT CONTRAST TECHNIQUE: Multidetector CT imaging of the head and cervical spine was performed following the standard protocol without intravenous contrast. Multiplanar CT image reconstructions of the cervical spine were also generated. COMPARISON:  MRI 09/15/2006 FINDINGS: CT HEAD FINDINGS Brain: No evidence of acute infarction, hemorrhage, hydrocephalus, extra-axial collection or mass lesion/mass effect. Vascular: Atherosclerotic calcification of the carotid siphons. No hyperdense vessel. Skull: No calvarial fracture or suspicious osseous lesion. No scalp swelling or hematoma. Sinuses/Orbits: Paranasal sinuses and mastoid air cells are  predominantly clear. Included orbital structures are unremarkable. Other: Edentulous CT CERVICAL SPINE FINDINGS Alignment: Stabilization collar in place at the time of examination. Mild leftward neck flexion straightening of the normal cervical lordosis at the upper cervical levels. Mild retrolisthesis of C5 on C6 favored to be on Jennet Scroggin degenerative basis with 3 mm of posterior translation. No evidence of traumatic listhesis. No abnormally widened, perched or jumped facets. Normal alignment of the craniocervical and atlantoaxial articulations. Skull base and vertebrae: No visible skull base fractures. No vertebral fracture or vertebral body height loss. Moderate arthrosis at the atlantodental interval. Multilevel spondylitic and facet degenerative changes. Normal bone mineralization. No worrisome osseous lesions. Soft tissues  and spinal canal: No pre or paravertebral fluid or swelling. No visible canal hematoma. Disc levels: Multilevel intervertebral disc height loss with spondylitic endplate changes. Larger disc osteophyte complexes at C3-4, C4-5, C5-6 result in mild canal stenosis. No severe canal narrowing. Multilevel uncinate spurring and facet hypertrophic changes are present as well resulting in mild-to-moderate foraminal narrowing C4-C7. Upper chest: No acute abnormality in the upper chest or imaged lung apices. Some mild bilateral pleuroparenchymal scarring. Other: Cervical carotid atherosclerosis. No concerning thyroid nodules. IMPRESSION: 1. No acute intracranial abnormality. No calvarial fracture or scalp swelling. 2. No acute fracture or traumatic listhesis of the cervical spine. 3. Multilevel degenerative changes of the cervical spine as described above. 4. Cervical and intracranial atherosclerosis. Electronically Signed   By: Kreg Shropshire M.D.   On: 02/29/2020 19:38   CT CERVICAL SPINE WO CONTRAST  Result Date: 02/29/2020 CLINICAL DATA:  Fall onto concrete, nausea, vomiting and urinary incontinence  EXAM: CT HEAD WITHOUT CONTRAST CT CERVICAL SPINE WITHOUT CONTRAST TECHNIQUE: Multidetector CT imaging of the head and cervical spine was performed following the standard protocol without intravenous contrast. Multiplanar CT image reconstructions of the cervical spine were also generated. COMPARISON:  MRI 09/15/2006 FINDINGS: CT HEAD FINDINGS Brain: No evidence of acute infarction, hemorrhage, hydrocephalus, extra-axial collection or mass lesion/mass effect. Vascular: Atherosclerotic calcification of the carotid siphons. No hyperdense vessel. Skull: No calvarial fracture or suspicious osseous lesion. No scalp swelling or hematoma. Sinuses/Orbits: Paranasal sinuses and mastoid air cells are predominantly clear. Included orbital structures are unremarkable. Other: Edentulous CT CERVICAL SPINE FINDINGS Alignment: Stabilization collar in place at the time of examination. Mild leftward neck flexion straightening of the normal cervical lordosis at the upper cervical levels. Mild retrolisthesis of C5 on C6 favored to be on Raiyah Speakman degenerative basis with 3 mm of posterior translation. No evidence of traumatic listhesis. No abnormally widened, perched or jumped facets. Normal alignment of the craniocervical and atlantoaxial articulations. Skull base and vertebrae: No visible skull base fractures. No vertebral fracture or vertebral body height loss. Moderate arthrosis at the atlantodental interval. Multilevel spondylitic and facet degenerative changes. Normal bone mineralization. No worrisome osseous lesions. Soft tissues and spinal canal: No pre or paravertebral fluid or swelling. No visible canal hematoma. Disc levels: Multilevel intervertebral disc height loss with spondylitic endplate changes. Larger disc osteophyte complexes at C3-4, C4-5, C5-6 result in mild canal stenosis. No severe canal narrowing. Multilevel uncinate spurring and facet hypertrophic changes are present as well resulting in mild-to-moderate foraminal  narrowing C4-C7. Upper chest: No acute abnormality in the upper chest or imaged lung apices. Some mild bilateral pleuroparenchymal scarring. Other: Cervical carotid atherosclerosis. No concerning thyroid nodules. IMPRESSION: 1. No acute intracranial abnormality. No calvarial fracture or scalp swelling. 2. No acute fracture or traumatic listhesis of the cervical spine. 3. Multilevel degenerative changes of the cervical spine as described above. 4. Cervical and intracranial atherosclerosis. Electronically Signed   By: Kreg Shropshire M.D.   On: 02/29/2020 19:38   MR BRAIN WO CONTRAST  Result Date: 03/01/2020 CLINICAL DATA:  Initial evaluation for acute altered mental status, fall. EXAM: MRI HEAD WITHOUT CONTRAST TECHNIQUE: Multiplanar, multiecho pulse sequences of the brain and surrounding structures were obtained without intravenous contrast. COMPARISON:  Prior head CT from 02/29/2020. FINDINGS: Brain: Examination degraded by motion artifact. Subtle asymmetric atrophy with associated FLAIR signal abnormality seen involving the anterior and mid left temporal lobe (series 11, image 9), of uncertain etiology, but suggesting Golden Emile chronic process, possibly related to underlying seizure disorder. No  diffusion abnormality to suggest acute seizure or status epilepticus. Otherwise, cerebral volume within normal limits. No significant cerebral white matter for age. No evidence for acute or subacute infarct. Gray-white matter differentiation well maintained elsewhere within the brain. No other areas of encephalomalacia to suggest chronic cortical infarction. No evidence for acute or chronic intracranial hemorrhage. No mass lesion, midline shift or mass effect. No hydrocephalus or extra-axial fluid collection. Pituitary gland suprasellar region within normal limits. Midline structures intact. Vascular: Major intracranial vascular flow voids grossly maintained at the skull base. Skull and upper cervical spine: Craniocervical  junction within normal limits. Degenerative spondylosis noted within the upper cervical spine without high-grade stenosis. Bone marrow signal intensity within normal limits. Hyperostosis frontalis interna noted. No scalp soft tissue abnormality. Sinuses/Orbits: Globes and orbital soft tissues grossly within normal limits. Paranasal sinuses are largely clear. No significant mastoid effusion. Inner ear structures grossly normal. Other: None. IMPRESSION: 1. Asymmetric atrophy with associated FLAIR signal abnormality involving the anterior and mid left temporal lobe, of uncertain etiology, but suggesting Leobardo Granlund chronic process, possibly related to underlying seizure disorder. Correlation with EEG suggested. 2. No other acute intracranial abnormality.  By Electronically Signed   By: Rise Mu M.D.   On: 03/01/2020 04:04   EEG adult  Result Date: 03/01/2020 Thana Farr, MD     03/01/2020  1:43 PM ELECTROENCEPHALOGRAM REPORT Patient: Tamara [name]       Room #: 3W23C EEG No. ID: [id number] Age: 59 y.o.        Sex: female Requesting Physician: Lowell Guitar Report Date:  03/01/2020       Interpreting Physician: Thana Farr History: Manna Weis is an 59 y.o. female with altered mental status Medications: Lipitor, Insulin, Losartan, Dulera, Lactulose Conditions of Recording:  This is Ammy Lienhard 21 channel routine scalp EEG performed with bipolar and monopolar montages arranged in accordance to the international 10/20 system of electrode placement. One channel was dedicated to EKG recording. The patient is in the awake, drowsy and asleep states. Description:  The waking background activity consists of Shalayne Leach low voltage, symmetrical, fairly well organized, 8 Hz alpha activity, seen from the parieto-occipital and posterior temporal regions.  Low voltage fast activity, poorly organized, is seen anteriorly and is at times superimposed on more posterior regions.  [place] mixture of theta and alpha rhythms are seen from the central and temporal  regions. The patient drowses with slowing to irregular, low voltage theta and beta activity.  The patient goes in to Pinchos Topel light sleep with symmetrical sleep spindles, vertex central sharp transients and irregular slow activity.  No epileptiform activity is noted.  Hyperventilation and intermittent photic stimulation were not performed. IMPRESSION: Normal electroencephalogram, awake, drowsy and sleep. There are no focal lateralizing or epileptiform features. Thana Farr, MD Neurology [phone] 03/01/2020, 1:41 PM        Scheduled Meds: . atorvastatin  40 mg Oral QHS  . insulin aspart  0-5 Units Subcutaneous QHS  . insulin aspart  0-9 Units Subcutaneous TID WC  . lactulose  20 g Oral Daily  . losartan  100 mg Oral Daily  . mometasone-formoterol  2 puff Inhalation BID  . pantoprazole  40 mg Oral Daily   Continuous Infusions: . lactated ringers 100 mL/hr at 03/01/20 1240     LOS: 0 days    Time spent: over 30 min    Lacretia Nicks, MD [facility]   To contact the attending provider between 7A-7P or the covering provider during after hours 7P-7A, please log into  the web site [email or web address] and access using universal [place] password for that web site. If you do not have the password, please call the hospital operator.  03/01/2020, 3:13 PM

## 2020-03-01 NOTE — H&P (Signed)
History and Physical  Tamara [name] JJH:[id number] DOB: February 07, 1961 DOA: 02/29/2020  Referring physician: Sherene Sires PA-C PCP: [name], NP  Patient coming from: Home  Chief Complaint: Altered mental status and a fall  HPI: Tamara [name] is a 59 y.o. female with medical history significant for type II DM, hyperlipidemia and asthma who presents to the emergency department via EMS due to altered mental status and unwitnessed fall.  Patient was unable to provide history regarding why she came to the ED, she states that she could not remember the details at this time.  History was obtained from ED PA and ED chart.  Per report, EMS was called to a local [facility], on arrival to the ED site, patient was laying on the pavement next a passenger's side car door, she had an episode of emesis and was incontinent of urine.  It was reported that patient's neighbor was present but could not provide any history.  CBG was reported to be 106 and BP was 102/70 per EMS.  Patient was provided with IV NS of 500 mL in route to the ED with improvement in BP to 137/87.  Cervical collar was also applied in route.  ED Course: In the emergency department, she was hemodynamically stable.  Work-up in the ED showed mild leukocytosis, BUN/creatinine 62/2.12 (was 1.74-1.80 about 2 weeks ago).  Urinalysis was positive for ketonuria, ammonia was 79, UDS was negative, beta-hCG 7.3 (patient is menopausal), magnesium 2.5.  The cervical spine without contrast no acute intracranial abnormality and no acute fracture or traumatic listhesis of the cervical spine.  IV hydration of 1 L NS was given and patient was provided with lactulose 20 g x 1.  Hospitalist was asked to admit.  For further evaluation and management.  Review of Systems: Review of Systems  Constitutional: Negative for chills and fever.  HENT: Negative for ear pain and sore throat.   Eyes: Negative for pain and visual disturbance.  Respiratory: Negative for  cough, chest tightness and shortness of breath.   Cardiovascular: Negative for chest pain and palpitations.  Gastrointestinal: Positive for vomiting.  Negative for abdominal pain  Endocrine: Negative for polyphagia and polyuria.  Genitourinary: Negative for decreased urine volume, dysuria, enuresis Musculoskeletal: Negative for arthralgias and back pain.  Skin: Negative for color change and rash.  Allergic/Immunologic: Negative for immunocompromised state.  Neurological: Negative for tremors, syncope, speech difficulty, weakness, light-headedness and headaches.  Hematological: Does not bruise/bleed easily.  All other systems reviewed and are negative  No past medical history on file. No past surgical history on file.  Social History:  has no history on file for tobacco use, alcohol use, and drug use.   No Known Allergies  No family history on file.   Prior to Admission medications   Medication Sig Start Date End Date Taking? Authorizing Provider  ACETAMINOPHEN EXTRA STRENGTH 500 MG tablet Take 1,000 mg by mouth 3 (three) times daily as needed. 12/01/19   [provider]  albuterol (PROVENTIL HFA;VENTOLIN HFA) 108 (90 BASE) MCG/ACT inhaler Inhale 2 puffs into the lungs every 6 (six) hours as needed for wheezing. 10/04/12   Johnson, Clanford L, MD  aspirin EC 81 MG tablet Take 81 mg by mouth daily.    [provider]  atorvastatin (LIPITOR) 80 MG tablet Take 40 mg by mouth at bedtime.     [provider]  buPROPion (WELLBUTRIN SR) 150 MG 12 hr tablet Take 150 mg by mouth daily.    [provider]  Fluticasone-Salmeterol (ADVAIR) 250-50 MCG/DOSE AEPB Inhale 1 puff into the lungs 2 (two) times daily.    [provider]  gabapentin (NEURONTIN) 100 MG capsule Take 100-300 mg by mouth See admin instructions. Take 1 capsule (100mg ) twice daily and 3 capsules (300mg ) at bedtime.    [provider]  gabapentin (NEURONTIN) 300 MG capsule Take 300  mg by mouth 3 (three) times daily. 01/27/20   [provider]  hydrOXYzine (VISTARIL) 25 MG capsule Take 25 mg by mouth 2 (two) times daily as needed. 01/27/20   [provider]  LANTUS SOLOSTAR 100 UNIT/ML Solostar Pen Inject 20 Units into the skin 2 (two) times daily. 01/27/20   [provider]  loratadine (CLARITIN) 10 MG tablet Take 1 tablet (10 mg total) by mouth daily. 01/14/13   Ghimire, Werner LeanShanker M, MD  losartan (COZAAR) 100 MG tablet Take 100 mg by mouth daily.    [provider]  magnesium 30 MG tablet Take 1 tablet (30 mg total) by mouth daily. 02/15/20   Terrilee FilesButler, Michael C, MD  metFORMIN (GLUCOPHAGE) 1000 MG tablet Take 1,000 mg by mouth 2 (two) times daily with a meal.    [provider]  MIRALAX 17 GM/SCOOP powder Take 17 g by mouth at bedtime. 01/27/20   [provider]  montelukast (SINGULAIR) 10 MG tablet Take 1 tablet (10 mg total) by mouth at bedtime. 10/04/12   Johnson, Clanford L, MD  NAPROSYN 500 MG tablet Take 500 mg by mouth 2 (two) times daily as needed. 01/27/20   [provider]  omeprazole (PRILOSEC) 20 MG capsule Take 20 mg by mouth daily.    [provider]  polyethylene glycol (MIRALAX / GLYCOLAX) packet Take 17 g by mouth daily as needed for mild constipation.    [provider]    Physical Exam: BP (!) 137/92   Pulse 69   Temp 97.8 F (36.6 C) (Oral)   Resp 11   SpO2 100%   . General: 59 y.o. year-old female well developed well nourished in no acute distress.  Alert and oriented x3.  Marland Kitchen. HEENT: Normocephalic, atraumatic, PERRLA, dry mucous membrane . Neck: Supple, trachea midline . Cardiovascular: Regular rate and rhythm with no rubs or gallops.  No thyromegaly or JVD noted.  No lower extremity edema. 2/4 pulses in all 4 extremities. Marland Kitchen. Respiratory: Clear to auscultation with no wheezes or rales. Good inspiratory effort. . Abdomen: Soft nontender nondistended with normal bowel sounds x4  quadrants. . Muskuloskeletal: No cyanosis, clubbing or edema noted bilaterally . Neuro: Alert and oriented to person and place, but not to time.  CN II-XII intact, strength, sensation, reflexes . Skin: No ulcerative lesions noted or rashes . Psychiatry: Judgement and insight appear normal. Mood is appropriate for condition and setting          Labs on Admission:  Basic Metabolic Panel: Recent Labs  Lab 02/29/20 1839 02/29/20 1859  NA 141 149*  K 4.2 3.5  CL 111  --   CO2 18*  --   GLUCOSE 167*  --   BUN 62*  --   CREATININE 2.12*  --   CALCIUM 10.1  --   MG 2.5*  --    Liver Function Tests: Recent Labs  Lab 02/29/20 1839  AST 26  ALT 24  ALKPHOS 75  BILITOT 1.1  PROT 8.2*  ALBUMIN 4.3   No results for input(s): LIPASE, AMYLASE in the last 168 hours. Recent Labs  Lab  02/29/20 1850  AMMONIA 79*   CBC: Recent Labs  Lab 02/29/20 1839 02/29/20 1859  WBC 12.1*  --   NEUTROABS 8.7*  --   HGB 13.0 12.2  HCT 40.1 36.0  MCV 89.5  --   PLT 443*  --    Cardiac Enzymes: No results for input(s): CKTOTAL, CKMB, CKMBINDEX, TROPONINI in the last 168 hours.  BNP (last 3 results) No results for input(s): BNP in the last 8760 hours.  ProBNP (last 3 results) No results for input(s): PROBNP in the last 8760 hours.  CBG: Recent Labs  Lab 02/29/20 1826  GLUCAP 164*    Radiological Exams on Admission: CT HEAD WO CONTRAST  Result Date: 02/29/2020 CLINICAL DATA:  Fall onto concrete, nausea, vomiting and urinary incontinence EXAM: CT HEAD WITHOUT CONTRAST CT CERVICAL SPINE WITHOUT CONTRAST TECHNIQUE: Multidetector CT imaging of the head and cervical spine was performed following the standard protocol without intravenous contrast. Multiplanar CT image reconstructions of the cervical spine were also generated. COMPARISON:  MRI 09/15/2006 FINDINGS: CT HEAD FINDINGS Brain: No evidence of acute infarction, hemorrhage, hydrocephalus, extra-axial collection or mass lesion/mass  effect. Vascular: Atherosclerotic calcification of the carotid siphons. No hyperdense vessel. Skull: No calvarial fracture or suspicious osseous lesion. No scalp swelling or hematoma. Sinuses/Orbits: Paranasal sinuses and mastoid air cells are predominantly clear. Included orbital structures are unremarkable. Other: Edentulous CT CERVICAL SPINE FINDINGS Alignment: Stabilization collar in place at the time of examination. Mild leftward neck flexion straightening of the normal cervical lordosis at the upper cervical levels. Mild retrolisthesis of C5 on C6 favored to be on a degenerative basis with 3 mm of posterior translation. No evidence of traumatic listhesis. No abnormally widened, perched or jumped facets. Normal alignment of the craniocervical and atlantoaxial articulations. Skull base and vertebrae: No visible skull base fractures. No vertebral fracture or vertebral body height loss. Moderate arthrosis at the atlantodental interval. Multilevel spondylitic and facet degenerative changes. Normal bone mineralization. No worrisome osseous lesions. Soft tissues and spinal canal: No pre or paravertebral fluid or swelling. No visible canal hematoma. Disc levels: Multilevel intervertebral disc height loss with spondylitic endplate changes. Larger disc osteophyte complexes at C3-4, C4-5, C5-6 result in mild canal stenosis. No severe canal narrowing. Multilevel uncinate spurring and facet hypertrophic changes are present as well resulting in mild-to-moderate foraminal narrowing C4-C7. Upper chest: No acute abnormality in the upper chest or imaged lung apices. Some mild bilateral pleuroparenchymal scarring. Other: Cervical carotid atherosclerosis. No concerning thyroid nodules. IMPRESSION: 1. No acute intracranial abnormality. No calvarial fracture or scalp swelling. 2. No acute fracture or traumatic listhesis of the cervical spine. 3. Multilevel degenerative changes of the cervical spine as described above. 4. Cervical  and intracranial atherosclerosis. Electronically Signed   By: Kreg Shropshire M.D.   On: 02/29/2020 19:38   CT CERVICAL SPINE WO CONTRAST  Result Date: 02/29/2020 CLINICAL DATA:  Fall onto concrete, nausea, vomiting and urinary incontinence EXAM: CT HEAD WITHOUT CONTRAST CT CERVICAL SPINE WITHOUT CONTRAST TECHNIQUE: Multidetector CT imaging of the head and cervical spine was performed following the standard protocol without intravenous contrast. Multiplanar CT image reconstructions of the cervical spine were also generated. COMPARISON:  MRI 09/15/2006 FINDINGS: CT HEAD FINDINGS Brain: No evidence of acute infarction, hemorrhage, hydrocephalus, extra-axial collection or mass lesion/mass effect. Vascular: Atherosclerotic calcification of the carotid siphons. No hyperdense vessel. Skull: No calvarial fracture or suspicious osseous lesion. No scalp swelling or hematoma. Sinuses/Orbits: Paranasal sinuses and mastoid air cells are predominantly clear. Included orbital structures  are unremarkable. Other: Edentulous CT CERVICAL SPINE FINDINGS Alignment: Stabilization collar in place at the time of examination. Mild leftward neck flexion straightening of the normal cervical lordosis at the upper cervical levels. Mild retrolisthesis of C5 on C6 favored to be on a degenerative basis with 3 mm of posterior translation. No evidence of traumatic listhesis. No abnormally widened, perched or jumped facets. Normal alignment of the craniocervical and atlantoaxial articulations. Skull base and vertebrae: No visible skull base fractures. No vertebral fracture or vertebral body height loss. Moderate arthrosis at the atlantodental interval. Multilevel spondylitic and facet degenerative changes. Normal bone mineralization. No worrisome osseous lesions. Soft tissues and spinal canal: No pre or paravertebral fluid or swelling. No visible canal hematoma. Disc levels: Multilevel intervertebral disc height loss with spondylitic endplate  changes. Larger disc osteophyte complexes at C3-4, C4-5, C5-6 result in mild canal stenosis. No severe canal narrowing. Multilevel uncinate spurring and facet hypertrophic changes are present as well resulting in mild-to-moderate foraminal narrowing C4-C7. Upper chest: No acute abnormality in the upper chest or imaged lung apices. Some mild bilateral pleuroparenchymal scarring. Other: Cervical carotid atherosclerosis. No concerning thyroid nodules. IMPRESSION: 1. No acute intracranial abnormality. No calvarial fracture or scalp swelling. 2. No acute fracture or traumatic listhesis of the cervical spine. 3. Multilevel degenerative changes of the cervical spine as described above. 4. Cervical and intracranial atherosclerosis. Electronically Signed   By: Kreg Shropshire M.D.   On: 02/29/2020 19:38    EKG: I independently viewed the EKG done and my findings are as followed: Sinus rhythm at rate of 73 bpm  Assessment/Plan Present on Admission: . Acute encephalopathy . Dyslipidemia  Principal Problem:   Acute encephalopathy Active Problems:   Diabetes (HCC)   Dyslipidemia   Asthma   Dehydration   Acute kidney injury superimposed on CKD (HCC)   Hyperammonemia (HCC)   Elevated serum hCG   Hypermagnesemia   Fall   Vomiting   Essential hypertension  Acute metabolic encephalopathy possibly secondary to multifactorial r/o seizures, CVA Hyperammonemia Patient presented with altered mental status which has improved since arrival at the ED, it was reported that patient had urinary incontinence, but since this was not witnessed and patient had some confusion afterwards, it was not certain if patient had seizures. Patient was more alert at bedside, though she was still not oriented to time Ammonia level was noted to be elevated at 79, lactulose was given in the ED Patient will be admitted to telemetry unit CT head and neck without contrast showed no acute findings rEEG will be done in the morning MRI  brain without contrast will be done in the morning UDS was negative Continue fall precaution, aspiration precaution, seizure precaution and neuro checks Repeat ammonia level in the morning Consider consulting with neurology based on EEG and MRI findings  Unwitnessed fall possibly secondary to above Fall precaution and neuro checks  Vomiting in the setting of above Continue IV Zofran 4mg  every 6 hours as needed  Dehydration Continue IV hydration  Acute kidney injury superimposed on CKD stage III Creatinine on admission=2.12  , this was 1.74-1.80 about 2 weeks ago.  Renally adjust medications, avoid nephrotoxic agents/dehydration/hypotension  Hypermagnesemia Mg 2.5; hold home magnesium tablets  Elevated beta-hCG hCG 7.3; patient is menopausal Patient may need outpatient follow-up with PCP for monitoring  Type II Diabetes mellitus Continue insulin sliding scale and hypoglycemia protocol  Dyslipidemia Continue home atorvastatin when med rec is updated  Asthma Continue Dulera, Ventolin and Singulair when med rec  is updated  Essential hypertension Continue losartan per home regimen when med rec is updated  DVT prophylaxis: SCDs  Code Status: Full code  Family Communication: None at bedside  Disposition Plan:  Patient is from:                        home Anticipated DC to:                   SNF or family members home Anticipated DC date:               24 hrs Anticipated DC barriers:         Pending MRI and EEG   Consults called: None  Admission status: Observation    Frankey Shown MD [facility] Pager [phone]  If 7PM-7AM, please contact night-coverage [email or web address] Password [email or web address]  03/01/2020, 1:30 AM

## 2020-03-01 NOTE — Plan of Care (Signed)

## 2020-03-01 NOTE — Progress Notes (Signed)
Procedure in progress, unable to do Dulera inhaler at this time

## 2020-03-01 NOTE — ED Notes (Signed)
Per MD, start patient with ice chips considering her LOC status.

## 2020-03-01 NOTE — Evaluation (Signed)
Clinical/Bedside Swallow Evaluation Patient Details  Name: Tamara [name] MRN: [id number] Date of Birth: 01-Oct-1960  Today's Date: 03/01/2020 Time: SLP Start Time (ACUTE ONLY): 1521 SLP Stop Time (ACUTE ONLY): 1542 SLP Time Calculation (min) (ACUTE ONLY): 21 min  Past Medical History:  Past Medical History:  Diagnosis Date  . Memory changes    Past Surgical History: No past surgical history on file. HPI:  Pt is a 59 y.o. female with medical history significant for type II DM, hyperlipidemia and asthma who presented to the emergency department via EMS due to altered mental status and unwitnessed fall. MRI brain: Asymmetric atrophy with associated FLAIR signal abnormality involving the anterior and mid left temporal lobe, of uncertain etiology, but suggesting a chronic process, possibly related to underlying seizure disorder. EEG normal   Assessment / Plan / Recommendation Clinical Impression  Pt was seen for bedside swallow evaluation. She reported that for the past 3-4 weeks she has been having globus sensation, chest pain, and abdominal pain when she consumes solids. She indicated that sensation of "sticking" was in her mid-chest area and that she has only been consuming liquids and ice due to these symptoms. Oral mechanism exam was limited due to pt exhibiting emesis following assessment of velar elevation and requesting that further oral examination be deferred. No symptoms of oropharyngeal dysphagia were noted with liquids and pt refused intake of solids. Diet recommendations will be deferred until the evaluation can be completed with solids. Esophageal assessment and/or GI consult are  recommended due to pt's symptoms of esophageal dysphagia.  SLP Visit Diagnosis: Dysphagia, unspecified (R13.10)    Aspiration Risk       Diet Recommendation Regular;Thin liquid (As tolerated)   Liquid Administration via: Cup;Straw Medication Administration: Whole meds with liquid Supervision: Patient  able to self feed Compensations: Slow rate;Small sips/bites Postural Changes: Seated upright at 90 degrees    Other  Recommendations Oral Care Recommendations: Oral care BID   Follow up Recommendations None      Frequency and Duration min 2x/week  1 week       Prognosis        Swallow Study   General Date of Onset: 02/29/20 HPI: Pt is a 59 y.o. female with medical history significant for type II DM, hyperlipidemia and asthma who presented to the emergency department via EMS due to altered mental status and unwitnessed fall. MRI brain: Asymmetric atrophy with associated FLAIR signal abnormality involving the anterior and mid left temporal lobe, of uncertain etiology, but suggesting a chronic process, possibly related to underlying seizure disorder. EEG normal Type of Study: Bedside Swallow Evaluation Previous Swallow Assessment: None Diet Prior to this Study: Regular;Thin liquids Temperature Spikes Noted: No Respiratory Status: Room air History of Recent Intubation: No Behavior/Cognition: Alert;Cooperative;Pleasant mood Oral Cavity Assessment: Within Functional Limits Oral Care Completed by SLP: No Oral Cavity - Dentition: Adequate natural dentition Vision: Functional for self-feeding Self-Feeding Abilities: Able to feed self Patient Positioning: Upright in bed;Postural control adequate for testing Baseline Vocal Quality: Normal Volitional Cough: Strong Volitional Swallow: Able to elicit    Oral/Motor/Sensory Function Overall Oral Motor/Sensory Function: Within functional limits   Ice Chips Ice chips: Not tested Presentation: Spoon   Thin Liquid Thin Liquid: Within functional limits Presentation: Cup;Straw    Nectar Thick Nectar Thick Liquid: Not tested   Honey Thick Honey Thick Liquid: Not tested   Puree Puree: Not tested (Pt refused)   Solid    Tamara [name] I. Vear Clock, MS, CCC-SLP Acute Rehabilitation Services Office number  [phone] Pager [phone] Solid: Not  tested (Pt refused)      Tamara [name] 03/01/2020,5:06 PM

## 2020-03-01 NOTE — Progress Notes (Signed)
EEG completed, results pending. 

## 2020-03-01 NOTE — Procedures (Signed)
ELECTROENCEPHALOGRAM REPORT   Patient: Tamara [name]       Room #: 3W23C EEG No. ID: [id number] Age: 59 y.o.        Sex: female Requesting Physician: Lowell Guitar Report Date:  03/01/2020        Interpreting Physician: Thana Farr  History: Tamara [name] is an 59 y.o. female with altered mental status  Medications:  Lipitor, Insulin, Losartan, Dulera, Lactulose  Conditions of Recording:  This is a 21 channel routine scalp EEG performed with bipolar and monopolar montages arranged in accordance to the international 10/20 system of electrode placement. One channel was dedicated to EKG recording.  The patient is in the awake, drowsy and asleep states.  Description:  The waking background activity consists of a low voltage, symmetrical, fairly well organized, 8 Hz alpha activity, seen from the parieto-occipital and posterior temporal regions.  Low voltage fast activity, poorly organized, is seen anteriorly and is at times superimposed on more posterior regions.  A mixture of theta and alpha rhythms are seen from the central and temporal regions. The patient drowses with slowing to irregular, low voltage theta and beta activity.   The patient goes in to a light sleep with symmetrical sleep spindles, vertex central sharp transients and irregular slow activity.   No epileptiform activity is noted.   Hyperventilation and intermittent photic stimulation were not performed.   IMPRESSION: Normal electroencephalogram, awake, drowsy and sleep. There are no focal lateralizing or epileptiform features.   Thana Farr, MD Neurology [phone] 03/01/2020, 1:41 PM

## 2020-03-01 NOTE — Progress Notes (Addendum)
Pt only AOx1 person per pt's sister Enid Cutter) pt being AOx1 is sometimes her norm, pt not able to complete admission documentation. Will conti to monitor.

## 2020-03-02 ENCOUNTER — Inpatient Hospital Stay (HOSPITAL_COMMUNITY): Payer: Self-pay

## 2020-03-02 LAB — CBC WITH DIFFERENTIAL/PLATELET
Abs Immature Granulocytes: 0.03 10*3/uL (ref 0.00–0.07)
Basophils Absolute: 0 10*3/uL (ref 0.0–0.1)
Basophils Relative: 0 %
Eosinophils Absolute: 0.2 10*3/uL (ref 0.0–0.5)
Eosinophils Relative: 2 %
HCT: 30.9 % — ABNORMAL LOW (ref 36.0–46.0)
Hemoglobin: 10.1 g/dL — ABNORMAL LOW (ref 12.0–15.0)
Immature Granulocytes: 0 %
Lymphocytes Relative: 29 %
Lymphs Abs: 2.7 10*3/uL (ref 0.7–4.0)
MCH: 29.2 pg (ref 26.0–34.0)
MCHC: 32.7 g/dL (ref 30.0–36.0)
MCV: 89.3 fL (ref 80.0–100.0)
Monocytes Absolute: 0.5 10*3/uL (ref 0.1–1.0)
Monocytes Relative: 5 %
Neutro Abs: 6 10*3/uL (ref 1.7–7.7)
Neutrophils Relative %: 64 %
Platelets: 337 10*3/uL (ref 150–400)
RBC: 3.46 MIL/uL — ABNORMAL LOW (ref 3.87–5.11)
RDW: 13.2 % (ref 11.5–15.5)
WBC: 9.3 10*3/uL (ref 4.0–10.5)
nRBC: 0 % (ref 0.0–0.2)

## 2020-03-02 LAB — COMPREHENSIVE METABOLIC PANEL
ALT: 24 U/L (ref 0–44)
AST: 20 U/L (ref 15–41)
Albumin: 3.4 g/dL — ABNORMAL LOW (ref 3.5–5.0)
Alkaline Phosphatase: 57 U/L (ref 38–126)
Anion gap: 10 (ref 5–15)
BUN: 27 mg/dL — ABNORMAL HIGH (ref 6–20)
CO2: 22 mmol/L (ref 22–32)
Calcium: 9.4 mg/dL (ref 8.9–10.3)
Chloride: 115 mmol/L — ABNORMAL HIGH (ref 98–111)
Creatinine, Ser: 1.33 mg/dL — ABNORMAL HIGH (ref 0.44–1.00)
GFR calc Af Amer: 51 mL/min — ABNORMAL LOW (ref >60)
GFR calc non Af Amer: 44 mL/min — ABNORMAL LOW (ref >60)
Glucose, Bld: 121 mg/dL — ABNORMAL HIGH (ref 70–99)
Potassium: 3.3 mmol/L — ABNORMAL LOW (ref 3.5–5.1)
Sodium: 147 mmol/L — ABNORMAL HIGH (ref 135–145)
Total Bilirubin: 0.8 mg/dL (ref 0.3–1.2)
Total Protein: 6.7 g/dL (ref 6.5–8.1)

## 2020-03-02 LAB — GLUCOSE, CAPILLARY
Glucose-Capillary: 165 mg/dL — ABNORMAL HIGH (ref 70–99)
Glucose-Capillary: 101 mg/dL — ABNORMAL HIGH (ref 70–99)
Glucose-Capillary: 106 mg/dL — ABNORMAL HIGH (ref 70–99)
Glucose-Capillary: 117 mg/dL — ABNORMAL HIGH (ref 70–99)
Glucose-Capillary: 139 mg/dL — ABNORMAL HIGH (ref 70–99)

## 2020-03-02 LAB — MAGNESIUM: Magnesium: 1.9 mg/dL (ref 1.7–2.4)

## 2020-03-02 LAB — RPR: RPR Ser Ql: NONREACTIVE

## 2020-03-02 LAB — PHOSPHORUS: Phosphorus: 2.1 mg/dL — ABNORMAL LOW (ref 2.5–4.6)

## 2020-03-02 LAB — AMMONIA: Ammonia: 12 umol/L (ref 9–35)

## 2020-03-02 MED ORDER — POTASSIUM CHLORIDE CRYS ER 20 MEQ PO TBCR
40.0000 meq | EXTENDED_RELEASE_TABLET | ORAL | Status: AC
  Administered 2020-03-02 (×2): 40 meq via ORAL
  Filled 2020-03-02 (×2): qty 2

## 2020-03-02 MED ORDER — LACTATED RINGERS IV SOLN: INTRAVENOUS | Status: DC

## 2020-03-02 NOTE — TOC Initial Note (Addendum)
Transition of Care [facility]) - Initial/Assessment Note    Patient Details  Name: Tamara [name] MRN: [id number] Date of Birth: September 04, 1960  [facility] CM/SW Contact:    Tamara Balo, RN Phone Number: 03/02/2020, 2:05 PM  Clinical Narrative:                 CM was finally able to speak to pts sister: Tamara [name] over the phone. She confirms the patient lives home alone but that she is check in on frequently. CM inquired about pt staying with a family member if needed at d/c and Tamara [name] states that could be arranged.  PCP: Tamara [name] at the [facility] No health insurance.  CM has asked for PT/OT evals.  TOC following.  Expected Discharge Plan: Home/Self Care Barriers to Discharge: Continued Medical Work up   Patient Goals and CMS Choice        Expected Discharge Plan and Services Expected Discharge Plan: Home/Self Care   Discharge Planning Services: CM Consult   Living arrangements for the past 2 months: Apartment                                      Prior Living Arrangements/Services Living arrangements for the past 2 months: Apartment Lives with:: Self Patient language and need for interpreter reviewed:: Yes Do you feel safe going back to the place where you live?: Yes      Need for Family Participation in Patient Care: Yes (Comment) Care giver support system in place?: Yes (comment)   Criminal Activity/Legal Involvement Pertinent to Current Situation/Hospitalization: No - Comment as needed  Activities of Daily Living      Permission Sought/Granted                  Emotional Assessment Appearance:: Appears stated age Attitude/Demeanor/Rapport: Engaged Affect (typically observed): Accepting Orientation: : Oriented to Self   Psych Involvement: No (comment)  Admission diagnosis:  Dehydration [E86.0] Acute encephalopathy [G93.40] AKI (acute kidney injury) (HCC) [N17.9] Altered mental status, unspecified altered mental status type [R41.82] AMS  (altered mental status) [R41.82] Patient Active Problem List   Diagnosis Date Noted  . Acute encephalopathy 03/01/2020  . Asthma 03/01/2020  . Dehydration 03/01/2020  . Acute kidney injury superimposed on CKD (HCC) 03/01/2020  . Hyperammonemia (HCC) 03/01/2020  . Elevated serum hCG 03/01/2020  . Hypermagnesemia 03/01/2020  . Fall 03/01/2020  . Vomiting 03/01/2020  . Essential hypertension 03/01/2020  . AMS (altered mental status) 03/01/2020  . Chest pain 11/03/2018  . Asthma with acute exacerbation 01/14/2013  . Diabetes (HCC) 01/14/2013  . Dyslipidemia 01/14/2013   PCP:  Tamara Sharps, NP Pharmacy:   [facility] - [place], [place] - [address] [place] [address] Phone: [phone] Fax: [phone]     Social Determinants of Health (SDOH) Interventions    Readmission Risk Interventions No flowsheet data found.

## 2020-03-02 NOTE — Progress Notes (Signed)
CM spoke with the patient yesterday and she gave permission for CM to reach out to her sister and Rexford Maus that was listed as a contact.  CM was unable to reach sister and her voicemail is full. CM has also texted her without response. CM called Lucky yesterday and he says he has not been involved with the patient for 2 years. ToC following.

## 2020-03-02 NOTE — Progress Notes (Signed)
PROGRESS NOTE    Tamara [name]  ZOX:[id number] DOB: [birth year] DOA: 02/29/2020 PCP: Tamara [name]   Chief Complaint  Patient presents with  . Fall  . Altered Mental Status    Brief Narrative:  Tamara [name] is Tamara [name] 59 y.o. female with medical history significant for type II DM, hyperlipidemia and asthma who presents to the emergency department via EMS due to altered mental status and unwitnessed fall.  Patient was unable to provide history regarding why she came to the ED, she states that she could not remember the details at this time.  History was obtained from ED PA and ED chart.  Per report, EMS was called to Tamara [name] local [facility], on arrival to the ED site, patient was laying on the pavement next Tamara [name] passenger's side car door, she had an episode of emesis and was incontinent of urine.  It was reported that patient's neighbor was present but could not provide any history.  CBG was reported to be 106 and BP was 102/70 per EMS.  Patient was provided with IV NS of 500 mL in route to the ED with improvement in BP to 137/87.  Cervical collar was also applied in route.  She was admitted with acute metabolic encephalopathy after being found down.  MRI notable for asymmetric atrophy with associated flair signal abnormality involving the anterior and mid left temporal lobe of uncertain etiology.  Neurology was consulted and recommended keppra 500 mg BID.  Currently pending further evaluation/improvement.  Assessment & Plan:   Principal Problem:   Acute encephalopathy Active Problems:   Diabetes (HCC)   Dyslipidemia   Asthma   Dehydration   Acute kidney injury superimposed on CKD (HCC)   Hyperammonemia (HCC)   Elevated serum hCG   Hypermagnesemia   Fall   Vomiting   Essential hypertension   AMS (altered mental status)  Acute metabolic encephalopathy  Patient found down with AMS - found on pavement next to car - emesis, incontinent - per triage note, typically pt is Tamara [name]&Ox4 - I've been unable  to reach family to discuss baseline, but per chart, note from 6:39 on  7/5, pt only Tamara [name]&Ox1 and this is sometimes her norm?  Will attempt to reach family for collateral (I haven't been able to reach family over phone).  Continues to have encephalopathy, confused and Tamara [name]&Ox1 for me this AM Elevated ammonia on presentation, improved today, will continue lactulose MRI with asymmetric atrophy with associated flair signal abnormality involving the anterior and mid left temporal lobe of uncertain etiology, but suggesting chonic process EEG normal  B12 wnl, folate low, RPR nonreactive UA not concerning for UTI Neurology c/s, appreciate recs -> continue keppra 500 BID, consider LTM EEG  UDS was negative Continue fall precaution, aspiration precaution, seizure precaution and neuro checks Hold potentially sedating/deliriogenic meds - holding gabapentin  Unwitnessed fall possibly secondary to above Fall precaution and neuro checks  Folate Deficiency: replace  Nausea  Vomiting Continued 7/5, CT abdomen/pelvis obtained for continued N/V -> small amount of gas within urinary bladder (nonspecific), hyperdense material layering in gallbladder (possible biliary sludge - no CT features of cholecystitis or biliary ductal dilatation) Continue IV Zofran 4mg  every 6 hours as needed  Dysphagia: SLP evaluated, recommended esophageal assessment vs GI consult  Regular/thin liquid Esophagram - limited exam - mild esophageal dysmotility - 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  Improving today, 1.33 NAGMA and hypernatremia likely related to NS resuscitation/CKD  Continue LR, follow  Hold losartan Renally adjust  medications, avoid nephrotoxic agents/dehydration/hypotension  Hypermagnesemia improved  Elevated beta-hCG hCG 7.3; patient is menopausal, unclear significance Follow outpatient   Type II Diabetes mellitus Holding home lantus for now Hold PO metformin Continue insulin sliding scale and hypoglycemia protocol  Dyslipidemia Continue home atorvastatin   Asthma Continue Dulera, Ventolin and Singulair  Essential hypertension Hold losartan with AKI  Depression: wellbutrin on hold   DVT prophylaxis: SCD Code Status: full Family Communication: none at bedside - no answer on sisters phone Disposition:   Status is: Observation  The patient will require care spanning > 2 midnights and should be moved to inpatient because: Inpatient level of care appropriate due to severity of illness  Dispo: The patient is from: Home              Anticipated d/c is to: pending              Anticipated d/c date is: 1 day              Patient currently is not medically stable to d/c.  Consultants:   neurology  Procedures: EEG  IMPRESSION: Normal electroencephalogram, awake, drowsy and sleep. There are no focal lateralizing or epileptiform features.   Antimicrobials:  Anti-infectives (From admission, onward)   None      Subjective: Tamara [name]&Ox1 this am Lethargic  Objective: Vitals:   03/02/20 0009 03/02/20 0418 03/02/20 0728 03/02/20 1142  BP: 122/83 132/81 119/76 (!) 141/84  Pulse: (!) 59 (!) 57 64 60  Resp: Temp: 99.2 F (37.3 C) 98 F (36.7 C) 98.4 F (36.9 C) (!) 97.4 F (36.3 C)  TempSrc: Oral Oral Oral Oral  SpO2: 100% 99% 100% 100%  Weight:      Height:        Intake/Output Summary (Last 24 hours) at 03/02/2020 1526 Last data filed at 03/02/2020 1143 Gross per 24 hour  Intake 1479.94 ml  Output --  Net 1479.94 ml   Filed Weights   03/01/20 0650  Weight: 69.1 kg    Examination:  General: No acute distress. Cardiovascular: Heart sounds show Tamara [name]  regular rate, and rhythm. Lungs: Clear to auscultation bilaterally Abdomen: Soft, nontender, nondistended  Neurological: lethargic, Tamara [name]&ox1.  Moves all extremities 4. Cranial nerves II through XII grossly intact. Skin: Warm and dry. No rashes or lesions. Extremities: No clubbing or cyanosis. No edema.  Data Reviewed: I have personally reviewed following labs and imaging studies  CBC: Recent Labs  Lab 02/29/20 1839 02/29/20 1859 03/01/20 0724 03/02/20 0757  WBC 12.1*  --  11.0* 9.3  NEUTROABS 8.7*  --   --  6.0  HGB 13.0 12.2 12.5 10.1*  HCT 40.1 36.0 37.8 30.9*  MCV 89.5  --  89.8 89.3  PLT 443*  --  437* 337    Basic Metabolic Panel: Recent Labs  Lab 02/29/20 1839 02/29/20 1859 03/01/20 0724 03/02/20 0757  NA 141 149* 148* 147*  K 4.2 3.5 3.3* 3.3*  CL 111  --  116* 115*  CO2 18*  --  21* 22  GLUCOSE 167*  --  145* 121*  BUN 62*  --  48* 27*  CREATININE 2.12*  --  1.59* 1.33*  CALCIUM 10.1  --  9.6 9.4  MG 2.5*  --   --  1.9  PHOS  --   --   --  2.1*    GFR: Estimated Creatinine Clearance: 45.4 mL/min (Amauris Debois) (by C-G formula based on SCr of 1.33 mg/dL (H)).  Liver Function Tests: Recent Labs  Lab 02/29/20 1839 03/01/20 0724 03/02/20 0757  AST 26 27 20   ALT 24 29 24   ALKPHOS 75 73 57  BILITOT 1.1 0.7 0.8  PROT 8.2* 7.8 6.7  ALBUMIN 4.3 3.9 3.4*    CBG: Recent Labs  Lab 03/01/20 1609 03/01/20 2133 03/02/20 0614 03/02/20 0735 03/02/20 1150  GLUCAP 150* 144* 165* 101* 106*     Recent Results (from the past 240 hour(s))  SARS Coronavirus 2 by RT PCR (hospital order, performed in [facility] hospital lab) Nasopharyngeal Nasopharyngeal Swab     Status: None   Collection Time: 02/29/20 11:12 PM   Specimen: Nasopharyngeal Swab  Result Value Ref Range Status   SARS Coronavirus 2 NEGATIVE NEGATIVE Final    Comment: (NOTE) SARS-CoV-2 target nucleic acids are NOT DETECTED.  The SARS-CoV-2 RNA is generally detectable in upper and lower respiratory  specimens during the acute phase of infection. The lowest concentration of SARS-CoV-2 viral copies this assay can detect is 250 copies / mL. Anzleigh Slaven negative result does not preclude SARS-CoV-2 infection and should not be used as the sole basis for treatment or other patient management decisions.  Krystal Delduca negative result may occur with improper specimen collection / handling, submission of specimen other than nasopharyngeal swab, presence of viral mutation(s) within the areas targeted by this assay, and inadequate number of viral copies (<250 copies / mL). Juandedios Dudash negative result must be combined with clinical observations, patient history, and epidemiological information.  Fact Sheet for Patients:   [facility]  Fact Sheet for Healthcare Providers: 05/01/20  This test is not yet approved or  cleared by the [employer] FDA and has been authorized for detection and/or diagnosis of SARS-CoV-2 by FDA under an Emergency Use Authorization (EUA).  This EUA will remain in effect (meaning this test can be used) for the duration of the COVID-19 declaration under Section 564(b)(1) of the Act, 21 U.S.C. section 360bbb-3(b)(1), unless the authorization is terminated or revoked sooner.  Performed at [facility], [address], [place], [address] [place]          Radiology Studies: CT ABDOMEN PELVIS WO CONTRAST  Result Date: 03/01/2020 CLINICAL DATA:  Nausea, vomiting, altered mental status and her with this fall EXAM: CT ABDOMEN AND PELVIS WITHOUT CONTRAST TECHNIQUE: Multidetector CT imaging of the abdomen and pelvis was performed following the standard protocol without IV contrast. COMPARISON:  CT abdomen pelvis 02/15/2020 FINDINGS: Lower chest: Atelectatic changes in the otherwise clear lung base. Normal heart size. No pericardial effusion. Hepatobiliary: No visible liver lesions or direct injury on this unenhanced CT. Smooth surface  contour. Normal attenuation of the liver parenchyma. Layering density within the gallbladder may reflect biliary sludge. No biliary ductal dilatation or visible calcified gallstones. Pancreas: Unremarkable. No pancreatic ductal dilatation or surrounding inflammatory changes. Spleen: No visible direct injury or perisplenic hemorrhage. Normal in size without focal abnormality. Adrenals/Urinary Tract: Normal adrenal glands. Kidneys symmetric in size and normally positioned. No perinephric hemorrhage or other signs of direct injury. No visible or contour deforming renal lesions. No urolithiasis or hydronephrosis. Small amount of gas present within the urinary bladder, nonspecific. Stomach/Bowel: Distal esophagus, stomach and duodenal sweep are unremarkable. No small bowel wall thickening  or dilatation. No evidence of obstruction. Harshith Pursell normal appendix is visualized. No colonic dilatation or wall thickening. Vascular/Lymphatic: Atherosclerotic calcifications within the abdominal aorta and branch vessels. No aneurysm or ectasia. No enlarged abdominopelvic lymph nodes. Reproductive: Several partially calcified fibroids present within the uterus, largest Jenascia Bumpass subserosal fibroid extending from the left uterine fundus measuring up to 2.9 cm in size. Mild uterine retroversion. No concerning adnexal lesions. Other: No abdominopelvic free fluid or free gas. No bowel containing hernias. Musculoskeletal: No acute osseous or soft tissue abnormality. IMPRESSION: 1. This exam is limited by the absence of intravenous contrast media. The use of intravenous contrast media aids in the detection of subtle though often significant abnormalities including visceral, vascular and soft tissue injuries which would otherwise be occult. 2. No visible traumatic injuries in the included abdomen or pelvis. 3. Small amount of gas within the urinary bladder, nonspecific, but can be seen with recent instrumentation. Correlate with urinalysis to exclude  cystitis. 4. Hyperdense material layering in the gallbladder, possible biliary sludge. No CT features of acute cholecystitis or biliary ductal dilatation. However if there is continued clinical concern, right upper quadrant ultrasound could be obtained. 5. No other acute findings in the abdomen or pelvis to provide cause for patient's symptoms. 6. Fibroid uterus. 7. Aortic Atherosclerosis (ICD10-I70.0). Electronically Signed   By: Kreg Shropshire M.D.   On: 03/01/2020 20:05   CT HEAD WO CONTRAST  Result Date: 02/29/2020 CLINICAL DATA:  Fall onto concrete, nausea, vomiting and urinary incontinence EXAM: CT HEAD WITHOUT CONTRAST CT CERVICAL SPINE WITHOUT CONTRAST TECHNIQUE: Multidetector CT imaging of the head and cervical spine was performed following the standard protocol without intravenous contrast. Multiplanar CT image reconstructions of the cervical spine were also generated. COMPARISON:  MRI 09/15/2006 FINDINGS: CT HEAD FINDINGS Brain: No evidence of acute infarction, hemorrhage, hydrocephalus, extra-axial collection or mass lesion/mass effect. Vascular: Atherosclerotic calcification of the carotid siphons. No hyperdense vessel. Skull: No calvarial fracture or suspicious osseous lesion. No scalp swelling or hematoma. Sinuses/Orbits: Paranasal sinuses and mastoid air cells are predominantly clear. Included orbital structures are unremarkable. Other: Edentulous CT CERVICAL SPINE FINDINGS Alignment: Stabilization collar in place at the time of examination. Mild leftward neck flexion straightening of the normal cervical lordosis at the upper cervical levels. Mild retrolisthesis of C5 on C6 favored to be on Reda Citron degenerative basis with 3 mm of posterior translation. No evidence of traumatic listhesis. No abnormally widened, perched or jumped facets. Normal alignment of the craniocervical and atlantoaxial articulations. Skull base and vertebrae: No visible skull base fractures. No vertebral fracture or vertebral body  height loss. Moderate arthrosis at the atlantodental interval. Multilevel spondylitic and facet degenerative changes. Normal bone mineralization. No worrisome osseous lesions. Soft tissues and spinal canal: No pre or paravertebral fluid or swelling. No visible canal hematoma. Disc levels: Multilevel intervertebral disc height loss with spondylitic endplate changes. Larger disc osteophyte complexes at C3-4, C4-5, C5-6 result in mild canal stenosis. No severe canal narrowing. Multilevel uncinate spurring and facet hypertrophic changes are present as well resulting in mild-to-moderate foraminal narrowing C4-C7. Upper chest: No acute abnormality in the upper chest or imaged lung apices. Some mild bilateral pleuroparenchymal scarring. Other: Cervical carotid atherosclerosis. No concerning thyroid nodules. IMPRESSION: 1. No acute intracranial abnormality. No calvarial fracture or scalp swelling. 2. No acute fracture or traumatic listhesis of the cervical spine. 3. Multilevel degenerative changes of the cervical spine as described above. 4. Cervical and intracranial atherosclerosis. Electronically Signed   By: Coralie Keens.D.  On: 02/29/2020 19:38   CT CERVICAL SPINE WO CONTRAST  Result Date: 02/29/2020 CLINICAL DATA:  Fall onto concrete, nausea, vomiting and urinary incontinence EXAM: CT HEAD WITHOUT CONTRAST CT CERVICAL SPINE WITHOUT CONTRAST TECHNIQUE: Multidetector CT imaging of the head and cervical spine was performed following the standard protocol without intravenous contrast. Multiplanar CT image reconstructions of the cervical spine were also generated. COMPARISON:  MRI 09/15/2006 FINDINGS: CT HEAD FINDINGS Brain: No evidence of acute infarction, hemorrhage, hydrocephalus, extra-axial collection or mass lesion/mass effect. Vascular: Atherosclerotic calcification of the carotid siphons. No hyperdense vessel. Skull: No calvarial fracture or suspicious osseous lesion. No scalp swelling or hematoma.  Sinuses/Orbits: Paranasal sinuses and mastoid air cells are predominantly clear. Included orbital structures are unremarkable. Other: Edentulous CT CERVICAL SPINE FINDINGS Alignment: Stabilization collar in place at the time of examination. Mild leftward neck flexion straightening of the normal cervical lordosis at the upper cervical levels. Mild retrolisthesis of C5 on C6 favored to be on Katiejo Gilroy degenerative basis with 3 mm of posterior translation. No evidence of traumatic listhesis. No abnormally widened, perched or jumped facets. Normal alignment of the craniocervical and atlantoaxial articulations. Skull base and vertebrae: No visible skull base fractures. No vertebral fracture or vertebral body height loss. Moderate arthrosis at the atlantodental interval. Multilevel spondylitic and facet degenerative changes. Normal bone mineralization. No worrisome osseous lesions. Soft tissues and spinal canal: No pre or paravertebral fluid or swelling. No visible canal hematoma. Disc levels: Multilevel intervertebral disc height loss with spondylitic endplate changes. Larger disc osteophyte complexes at C3-4, C4-5, C5-6 result in mild canal stenosis. No severe canal narrowing. Multilevel uncinate spurring and facet hypertrophic changes are present as well resulting in mild-to-moderate foraminal narrowing C4-C7. Upper chest: No acute abnormality in the upper chest or imaged lung apices. Some mild bilateral pleuroparenchymal scarring. Other: Cervical carotid atherosclerosis. No concerning thyroid nodules. IMPRESSION: 1. No acute intracranial abnormality. No calvarial fracture or scalp swelling. 2. No acute fracture or traumatic listhesis of the cervical spine. 3. Multilevel degenerative changes of the cervical spine as described above. 4. Cervical and intracranial atherosclerosis. Electronically Signed   By: Kreg Shropshire M.D.   On: 02/29/2020 19:38   MR BRAIN WO CONTRAST  Result Date: 03/01/2020 CLINICAL DATA:  Initial  evaluation for acute altered mental status, fall. EXAM: MRI HEAD WITHOUT CONTRAST TECHNIQUE: Multiplanar, multiecho pulse sequences of the brain and surrounding structures were obtained without intravenous contrast. COMPARISON:  Prior head CT from 02/29/2020. FINDINGS: Brain: Examination degraded by motion artifact. Subtle asymmetric atrophy with associated FLAIR signal abnormality seen involving the anterior and mid left temporal lobe (series 11, image 9), of uncertain etiology, but suggesting Chemeka Filice chronic process, possibly related to underlying seizure disorder. No diffusion abnormality to suggest acute seizure or status epilepticus. Otherwise, cerebral volume within normal limits. No significant cerebral white matter for age. No evidence for acute or subacute infarct. Gray-white matter differentiation well maintained elsewhere within the brain. No other areas of encephalomalacia to suggest chronic cortical infarction. No evidence for acute or chronic intracranial hemorrhage. No mass lesion, midline shift or mass effect. No hydrocephalus or extra-axial fluid collection. Pituitary gland suprasellar region within normal limits. Midline structures intact. Vascular: Major intracranial vascular flow voids grossly maintained at the skull base. Skull and upper cervical spine: Craniocervical junction within normal limits. Degenerative spondylosis noted within the upper cervical spine without high-grade stenosis. Bone marrow signal intensity within normal limits. Hyperostosis frontalis interna noted. No scalp soft tissue abnormality. Sinuses/Orbits: Globes and orbital soft tissues  grossly within normal limits. Paranasal sinuses are largely clear. No significant mastoid effusion. Inner ear structures grossly normal. Other: None. IMPRESSION: 1. Asymmetric atrophy with associated FLAIR signal abnormality involving the anterior and mid left temporal lobe, of uncertain etiology, but suggesting Ezelle Surprenant chronic process, possibly related  to underlying seizure disorder. Correlation with EEG suggested. 2. No other acute intracranial abnormality.  By Electronically Signed   By: Rise Mu M.D.   On: 03/01/2020 04:04   EEG adult  Result Date: 03/01/2020 Thana Farr, MD     03/01/2020  1:43 PM ELECTROENCEPHALOGRAM REPORT Patient: Tamara Backers       Room #: 3W23C EEG No. ID: [id number] Age: 59 y.o.        Sex: female Requesting Physician: Lowell Guitar Report Date:  03/01/2020       Interpreting Physician: Thana Farr History: Lenda Pinkus is an 59 y.o. female with altered mental status Medications: Lipitor, Insulin, Losartan, Dulera, Lactulose Conditions of Recording:  This is Demitrious Mccannon 21 channel routine scalp EEG performed with bipolar and monopolar montages arranged in accordance to the international 10/20 system of electrode placement. One channel was dedicated to EKG recording. The patient is in the awake, drowsy and asleep states. Description:  The waking background activity consists of Byrdie Miyazaki low voltage, symmetrical, fairly well organized, 8 Hz alpha activity, seen from the parieto-occipital and posterior temporal regions.  Low voltage fast activity, poorly organized, is seen anteriorly and is at times superimposed on more posterior regions.  Clio Gerhart mixture of theta and alpha rhythms are seen from the central and temporal regions. The patient drowses with slowing to irregular, low voltage theta and beta activity.  The patient goes in to Abrahim Sargent light sleep with symmetrical sleep spindles, vertex central sharp transients and irregular slow activity.  No epileptiform activity is noted.  Hyperventilation and intermittent photic stimulation were not performed. IMPRESSION: Normal electroencephalogram, awake, drowsy and sleep. There are no focal lateralizing or epileptiform features. Thana Farr, MD Neurology [phone] 03/01/2020, 1:41 PM   DG ESOPHAGUS W SINGLE CM (SOL OR THIN BA)  Result Date: 03/02/2020 CLINICAL DATA:  Inpatient with encephalopathy,  nausea and vomiting. Reported Prilosec at home. Diabetes mellitus. EXAM: ESOPHOGRAM/BARIUM SWALLOW TECHNIQUE: Single contrast examination was performed using  thin barium. FLUOROSCOPY TIME:  Fluoroscopy Time:  2 minutes 0 seconds Radiation Exposure Index (if provided by the fluoroscopic device): 9.7 mGy Number of Acquired Spot Images: 1 COMPARISON:  03/01/2020 CT abdomen/pelvis. FINDINGS: Examination limited by patient's encephalopathy. Examination conducted with the patient supine with elevated head position. Grossly normal oral and pharyngeal phase of swallowing, with no evidence of laryngeal penetration or tracheobronchial aspiration. Mild esophageal dysmotility, characterized by intermittent weakening of primary peristalsis in the mid to lower thoracic esophagus. No hiatal hernia. No gastroesophageal reflux elicited despite provocative maneuvers including water siphon test. No evidence of esophageal mass, stricture or ulcer. Barium tablet traversed the esophagus into the stomach without significant delay. IMPRESSION: 1. Limited examination, see comments. 2. Mild esophageal dysmotility, suggestive of chronic reflux related dysmotility pattern. 3. No hiatal hernia.  No gastroesophageal reflux elicited. 4. No gross evidence of esophageal mass or stricture. Electronically Signed   By: Delbert Phenix M.D.   On: 03/02/2020 09:17        Scheduled Meds: . aspirin EC  81 mg Oral Daily  . atorvastatin  40 mg Oral QHS  . folic acid  1 mg Oral Daily  . insulin aspart  0-5 Units Subcutaneous QHS  . insulin aspart  0-9 Units  Subcutaneous TID WC  . lactulose  20 g Oral Daily  . mometasone-formoterol  2 puff Inhalation BID  . montelukast  10 mg Oral QHS  . pantoprazole  40 mg Oral Daily   Continuous Infusions: . levETIRAcetam 500 mg (03/02/20 0455)     LOS: 1 day    Time spent: over 30 min    Lacretia Nicks, MD [facility]   To contact the attending provider between 7A-7P or the covering  provider during after hours 7P-7A, please log into the web site [email or web address] and access using universal [place] password for that web site. If you do not have the password, please call the hospital operator.  03/02/2020, 3:26 PM

## 2020-03-02 NOTE — Plan of Care (Signed)

## 2020-03-02 NOTE — Progress Notes (Signed)
Pt refused morning labs, [facility] informed. Lab to return around 0730 when pt more awake.

## 2020-03-02 NOTE — Progress Notes (Signed)
Night care called at 0720 from xray about pt having a scheduled esophogram and pt should have been NPO at midnight. This study or order for NPO at midnight not communicated during handoff nor from providers to this care nurse. Luckily, pt had not eaten or drank anything over night.

## 2020-03-02 NOTE — Progress Notes (Signed)
Pt refused HS meds.

## 2020-03-02 NOTE — Progress Notes (Addendum)
NEUROLOGY PROGRESS NOTE  Subjective: Patient still complaining about chest pain and abdominal pain.  While transport was in there patient was clearly talking to her stating "I am okay however like to have my phone.  I really would like to have this abdominal pain go away".  When asked if she has any issues she clearly states again that it is her abdominal pain that is causing problems.  Exam: Vitals:   03/02/20 0418 03/02/20 0728  BP: 132/81 119/76  Pulse: (!) 57 64  Resp: 16 16  Temp: 98 F (36.7 C) 98.4 F (36.9 C)  SpO2: 99% 100%     Neuro:  Mental Status: Patient is more alert today, she knows it is 2021, July but believes she is at [employer].  She is able to name my thumb.  She is able to follow commands much better than yesterday. Cranial Nerves: II:  Visual fields grossly normal,  III,IV, VI: ptosis not present, extra-ocular motions intact bilaterally pupils equal, round, reactive to light and accommodation V,VII: smile symmetric, facial light touch sensation normal bilaterally VIII: hearing normal bilaterally XII: midline tongue extension Motor: Bilateral upper extremities are antigravity.  Hip flexion is 5/5, I could not get patient to lift her leg off the bed with it held out in extension.  Dorsi flexion and plantar flexion 5/5. Sensory: Pinprick and light touch intact throughout, bilaterally Deep Tendon Reflexes: Trace deep tendon reflexes at the knee and brachial radialis Plantars: Right: downgoing   Left: downgoing Cerebellar: normal finger-to-nose   Medications:  Scheduled: . aspirin EC  81 mg Oral Daily  . atorvastatin  40 mg Oral QHS  . folic acid  1 mg Oral Daily  . insulin aspart  0-5 Units Subcutaneous QHS  . insulin aspart  0-9 Units Subcutaneous TID WC  . lactulose  20 g Oral Daily  . mometasone-formoterol  2 puff Inhalation BID  . montelukast  10 mg Oral QHS  . pantoprazole  40 mg Oral Daily   Continuous: . lactated ringers Stopped (03/02/20  0919)  . levETIRAcetam 500 mg (03/02/20 0455)   VVO:HYWVPXTGG, ondansetron **OR** ondansetron (ZOFRAN) IV  Pertinent Labs/Diagnostics: Ammonia-12 Phosphorus-2.1 Magnesium-1.9 Creatinine is down from 1.59-1.33   EEG adult  Result Date: 03/01/2020 . IMPRESSION: Normal electroencephalogram, awake, drowsy and sleep. There are no focal lateralizing or epileptiform features. Thana Farr, MD Neurology [phone] 03/01/2020, 1:41 PM    Felicie Morn PA-C [facility] [phone]  Assessment:  59 year old female who was found down in the parking lot of [employer] however no witnesses were available and downtime is unknown.  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  Today patient is more awake and interactive.  She is also more alert and able to answer questions.  She remains confused however as stated sisters at bedside yesterday did state that she has been having more memory problems that have been progressive.   Recommendations: -Continue Keppra 500 mg twice daily  Ritta Slot, MD [facility] [phone]  If 7pm- 7am, please page neurology on call as listed in AMION.  03/02/2020, 9:57 AM

## 2020-03-03 DIAGNOSIS — R4182 Altered mental status, unspecified: Secondary | ICD-10-CM

## 2020-03-03 LAB — CBC WITH DIFFERENTIAL/PLATELET
Abs Immature Granulocytes: 0.03 10*3/uL (ref 0.00–0.07)
Basophils Absolute: 0 10*3/uL (ref 0.0–0.1)
Basophils Relative: 0 %
Eosinophils Absolute: 0.2 10*3/uL (ref 0.0–0.5)
Eosinophils Relative: 2 %
HCT: 30.6 % — ABNORMAL LOW (ref 36.0–46.0)
Hemoglobin: 9.9 g/dL — ABNORMAL LOW (ref 12.0–15.0)
Immature Granulocytes: 0 %
Lymphocytes Relative: 31 %
Lymphs Abs: 2.6 10*3/uL (ref 0.7–4.0)
MCH: 28.9 pg (ref 26.0–34.0)
MCHC: 32.4 g/dL (ref 30.0–36.0)
MCV: 89.2 fL (ref 80.0–100.0)
Monocytes Absolute: 0.4 10*3/uL (ref 0.1–1.0)
Monocytes Relative: 5 %
Neutro Abs: 5.2 10*3/uL (ref 1.7–7.7)
Neutrophils Relative %: 62 %
Platelets: 316 10*3/uL (ref 150–400)
RBC: 3.43 MIL/uL — ABNORMAL LOW (ref 3.87–5.11)
RDW: 13 % (ref 11.5–15.5)
WBC: 8.5 10*3/uL (ref 4.0–10.5)
nRBC: 0 % (ref 0.0–0.2)

## 2020-03-03 LAB — GLUCOSE, CAPILLARY
Glucose-Capillary: 123 mg/dL — ABNORMAL HIGH (ref 70–99)
Glucose-Capillary: 126 mg/dL — ABNORMAL HIGH (ref 70–99)
Glucose-Capillary: 133 mg/dL — ABNORMAL HIGH (ref 70–99)
Glucose-Capillary: 110 mg/dL — ABNORMAL HIGH (ref 70–99)

## 2020-03-03 LAB — COMPREHENSIVE METABOLIC PANEL
ALT: 24 U/L (ref 0–44)
AST: 21 U/L (ref 15–41)
Albumin: 3.3 g/dL — ABNORMAL LOW (ref 3.5–5.0)
Alkaline Phosphatase: 58 U/L (ref 38–126)
Anion gap: 8 (ref 5–15)
BUN: 18 mg/dL (ref 6–20)
CO2: 24 mmol/L (ref 22–32)
Calcium: 9.4 mg/dL (ref 8.9–10.3)
Chloride: 112 mmol/L — ABNORMAL HIGH (ref 98–111)
Creatinine, Ser: 1.12 mg/dL — ABNORMAL HIGH (ref 0.44–1.00)
GFR calc Af Amer: >60 mL/min (ref >60)
GFR calc non Af Amer: 54 mL/min — ABNORMAL LOW (ref >60)
Glucose, Bld: 121 mg/dL — ABNORMAL HIGH (ref 70–99)
Potassium: 3.8 mmol/L (ref 3.5–5.1)
Sodium: 144 mmol/L (ref 135–145)
Total Bilirubin: 0.5 mg/dL (ref 0.3–1.2)
Total Protein: 6.5 g/dL (ref 6.5–8.1)

## 2020-03-03 LAB — PHOSPHORUS: Phosphorus: 2.5 mg/dL (ref 2.5–4.6)

## 2020-03-03 LAB — MAGNESIUM: Magnesium: 1.8 mg/dL (ref 1.7–2.4)

## 2020-03-03 MED ORDER — LEVETIRACETAM 500 MG PO TABS
500.0000 mg | ORAL_TABLET | Freq: Two times a day (BID) | ORAL | 3 refills | Status: DC
Start: 2020-03-03 — End: 2020-04-23

## 2020-03-03 MED ORDER — LEVETIRACETAM 500 MG PO TABS
500.0000 mg | ORAL_TABLET | Freq: Two times a day (BID) | ORAL | Status: DC
  Administered 2020-03-04: 500 mg via ORAL
  Filled 2020-03-03 (×2): qty 1

## 2020-03-03 MED FILL — levETIRAcetam 500 MG TABS: 500 | 30 days supply | Qty: 60 | Fill #0

## 2020-03-03 NOTE — Progress Notes (Signed)
Pt very verbally explicit (sexually), speaking off how she pleasures herself "better than" her "boyfriend". This care nurse did not engage in conversation, administered her insulin, and respectfully left the room. The on-coming nurse updated on pt's type of language.

## 2020-03-03 NOTE — Progress Notes (Signed)
NEUROLOGY PROGRESS NOTE   Subjective: Patient is significantly improved today.  Much more interactive and able to follow commands.  She is looking forward to going home at this point.  Exam: Vitals:   03/03/20 0422 03/03/20 0929  BP: (!) 152/90   Pulse: (!) 59   Resp: 18   Temp: 98.1 F (36.7 C)   SpO2: 100% 100%    Neuro:  Mental Status: Alert, oriented, thought content appropriate.  Speech fluent without evidence of aphasia.  Able to follow simple commands without difficulty. Cranial Nerves: II:  Visual fields grossly normal,  III,IV, VI: ptosis not present, extra-ocular motions intact bilaterally pupils equal, round, reactive to light and accommodation V,VII: smile symmetric, facial light touch sensation normal bilaterally VIII: hearing normal bilaterally XI: bilateral shoulder shrug XII: midline tongue extension Motor: Right : Upper extremity   5/5    Left:     Upper extremity   5/5  Lower extremity   5/5     Lower extremity   5/5 Tone and bulk:normal tone throughout; no atrophy noted   Medications:  Scheduled: . aspirin EC  81 mg Oral Daily  . atorvastatin  40 mg Oral QHS  . folic acid  1 mg Oral Daily  . insulin aspart  0-5 Units Subcutaneous QHS  . insulin aspart  0-9 Units Subcutaneous TID WC  . lactulose  20 g Oral Daily  . mometasone-formoterol  2 puff Inhalation BID  . montelukast  10 mg Oral QHS  . pantoprazole  40 mg Oral Daily   Continuous: . levETIRAcetam 500 mg (03/03/20 0602)   ZDG:[id number], ondansetron **OR** ondansetron (ZOFRAN) IV  Pertinent Labs/Diagnostics:  EEG adult  Result Date: 03/01/2020 Thana Farr, MD     03/01/2020  1:43 PM ELECTROENCEPHALOGRAM REPORT Patient: Ladaja Alvillar        IMPRESSION: Normal electroencephalogram, awake, drowsy and sleep. There are no focal lateralizing or epileptiform features. Thana Farr, MD Neurology [phone] 03/01/2020, 1:41 PM    Assessment:  59 year old female who was found down in the  parking lot of [employer].  However unfortunately there are no witnesses available and the downtime was unknown.  MRI obtained in the hospital did show atrophy with associated FLAIR signal abnormal within the region involving the anterior and middle temporal lobe.  On exam today she is much improved.  She is alert and very interactive.  Follows all commands.  At this point she is asking if she can go home.  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 Use caution when using heavy equipment or power tools. Avoid working on ladders or at heights. Take showers instead of baths. Ensure the water temperature is not too high on the home water heater. Do not go swimming alone. When caring for infants or small children, sit down when holding, feeding, or changing them to minimize risk of injury to the child in the event you have a seizure.   Also, Maintain good sleep hygiene. Avoid alcohol.  Felicie Morn PA-C [facility] [phone]   03/03/2020, 10:57 AM

## 2020-03-03 NOTE — Evaluation (Signed)
Occupational Therapy Evaluation Patient Details Name: Tamara [name] MRN: [id number] DOB: [birth year] Today's Date: 03/03/2020    History of Present Illness Tamara [name] is a 59 y.o. female with medical history significant for type II DM, hyperlipidemia and asthma who presents to the emergency department via EMS due to altered mental status and unwitnessed fall. 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 Neurology recommending Keppra   Clinical Impression   Pt is limited historian with AMS and reported decreased memory - no family/caregiver present to confirm living arrangements/PLOF. PTA pt reports needing some assists with ADLs and not driving. Pt was admitted for above and treated for problem list below (see OT Problem List).  Pt A&Ox2 - disoriented to time and situation. Requiring multimodal cues for grooming task at sink for sequencing and problem solving. Requires Supervision - Min A for ADLs with max verbal cues for sequencing and safety due to decreased safety awareness and attention to task. Requires Min Guard with transfers and ambulation with min verbal cues for safety awareness. Pt repeatedly reported that she "feels good and is ready to go home" despite cognitive deficits. Believe pt would benefit from skilled OT services acutely and at the [facility] level (if confirmed 24/7 assist/support available) upon discharge to increase safety with ADLs and return to PLOF.    Follow Up Recommendations  Supervision/Assistance - 24 hour;Home health OT    Equipment Recommendations  3 in 1 bedside commode       Precautions / Restrictions Precautions Precautions: Fall Restrictions Weight Bearing Restrictions: No      Mobility Bed Mobility Overal bed mobility: Needs Assistance Bed Mobility: Supine to Sit     Supine to sit: Supervision     General bed mobility comments: supervision with cues for  sequencing  Transfers Overall transfer level: Needs assistance Equipment used: None Transfers: Sit to/from Stand Sit to Stand: Min guard         General transfer comment: Min guard for safety    Balance Overall balance assessment: Needs assistance Sitting-balance support: No upper extremity supported;Feet supported Sitting balance-Leahy Scale: Good     Standing balance support: During functional activity;No upper extremity supported Standing balance-Leahy Scale: Poor Standing balance comment: noted small LOB with ambulation and reaching for environmental supports                           ADL either performed or assessed with clinical judgement   ADL Overall ADL's : Needs assistance/impaired     Grooming: Wash/dry face;Supervision/safety;Sitting;Standing;Cueing for sequencing Grooming Details (indicate cue type and reason): supervision for safety and cues for sequencing. Pt needing to sit at sink due to fatigue Upper Body Bathing: Minimal assistance;Cueing for sequencing Upper Body Bathing Details (indicate cue type and reason): Min A with max cues for sequencing and attention to task Lower Body Bathing: Minimal assistance;Sitting/lateral leans;Cueing for sequencing Lower Body Bathing Details (indicate cue type and reason): Min A with max cueing for sequencing  Upper Body Dressing : Supervision/safety;Sitting;Cueing for sequencing Upper Body Dressing Details (indicate cue type and reason): supervision with cues for sequencing Lower Body Dressing: Minimal assistance;Sit to/from stand;Cueing for sequencing Lower Body Dressing Details (indicate cue type and reason): Min A with max cueing for sequencing  Toilet Transfer: Min guard;Comfort height toilet;Ambulation Toilet Transfer Details (indicate cue type and reason): Min guard for safety Toileting- Clothing Manipulation and Hygiene: Min guard;Cueing for sequencing;Sitting/lateral  lean;Sit to/from stand Toileting -  Architect Details (indicate cue type and reason): Min guard for safety   Tub/Shower Transfer Details (indicate cue type and reason): deferred due to pt safety Functional mobility during ADLs: Min guard;Cueing for sequencing;Cueing for safety General ADL Comments: Supervision to Min A with ADLs due to decreased cognition/safety awareness and attention to task     Vision Baseline Vision/History: Wears glasses Wears Glasses: Reading only Patient Visual Report: No change from baseline              Pertinent Vitals/Pain Pain Assessment: No/denies pain     Hand Dominance Right   Extremity/Trunk Assessment Upper Extremity Assessment Upper Extremity Assessment: Generalized weakness   Lower Extremity Assessment Lower Extremity Assessment: Defer to PT evaluation   Cervical / Trunk Assessment Cervical / Trunk Assessment: Kyphotic   Communication Communication Communication: Expressive difficulties (slow speaking & difficulty finding words)   Cognition Arousal/Alertness: Awake/alert Behavior During Therapy: Impulsive;Flat affect Overall Cognitive Status: Impaired/Different from baseline Area of Impairment: Orientation;Attention;Following commands;Memory;Safety/judgement;Problem solving;Awareness                 Orientation Level: Disoriented to;Time;Situation (knew year not sure about month and day) Current Attention Level: Focused Memory: Decreased short-term memory Following Commands: Follows one step commands inconsistently Safety/Judgement: Decreased awareness of deficits;Decreased awareness of safety Awareness: Intellectual Problem Solving: Difficulty sequencing;Requires verbal cues;Requires tactile cues General Comments: Pt A&Ox2 - disoriented to time and situation. Requiring multimodal cues for grooming task at sink for sequencing and problem solving. Decreased awareness of safety when ambulating and decreased awareness of deficits with saying she feels  good and is ready to go home   General Comments  Pt reports she feels good to go home despite noted cognitive deficits.             Home Living Family/patient expects to be discharged to:: Private residence Living Arrangements: Spouse/significant other Available Help at Discharge: Family;Available PRN/intermittently Type of Home: Apartment Home Access: Level entry     Home Layout: One level     Bathroom Shower/Tub: Producer, television/film/video: Standard         Additional Comments: Pt limited historian with AMS. No family present to confirm home set up      Prior Functioning/Environment Level of Independence: Needs assistance    ADL's / Homemaking Assistance Needed: Some assist with ADLs and IADLs from boyfriend per pt report   Comments: Pt limited historian with AMS. No family present to confirm PLOF. Pt reports no longer driving        OT Problem List: Decreased strength;Decreased activity tolerance;Impaired balance (sitting and/or standing);Decreased cognition;Decreased safety awareness;Decreased knowledge of use of DME or AE;Decreased knowledge of precautions      OT Treatment/Interventions: Self-care/ADL training;Therapeutic exercise;Energy conservation;DME and/or AE instruction;Therapeutic activities;Cognitive remediation/compensation;Patient/family education;Balance training    OT Goals(Current goals can be found in the care plan section) Acute Rehab OT Goals Patient Stated Goal: go home OT Goal Formulation: With patient Time For Goal Achievement: 03/17/20 Potential to Achieve Goals: Good  OT Frequency: Min 2X/week    AM-PAC OT "6 Clicks" Daily Activity     Outcome Measure Help from another person eating meals?: None Help from another person taking care of personal grooming?: A Little Help from another person toileting, which includes using toliet, bedpan, or urinal?: A Little Help from another person bathing (including washing, rinsing, drying)?: A  Little Help from another person to put on and taking off regular upper body clothing?:  A Little Help from another person to put on and taking off regular lower body clothing?: A Little 6 Click Score: 19   End of Session Equipment Utilized During Treatment: Gait belt Nurse Communication: Mobility status  Activity Tolerance: Patient tolerated treatment well Patient left: in chair;with call bell/phone within reach;with chair alarm set  OT Visit Diagnosis: Unsteadiness on feet (R26.81);Muscle weakness (generalized) (M62.81);Other symptoms and signs involving cognitive function                Time: 9741-6384 OT Time Calculation (min): 30 min Charges:  OT General Charges $OT Visit: 1 Visit OT Evaluation $OT Eval Moderate Complexity: 1 Mod OT Treatments $Self Care/Home Management : 8-22 mins  Tamara [name]/OTS  Tamara [name] 03/03/2020, 10:19 AM

## 2020-03-03 NOTE — Progress Notes (Signed)
Pt pulled out IV and states that it was hurting and that is why she pulled it out.

## 2020-03-03 NOTE — Evaluation (Signed)
Physical Therapy Evaluation Patient Details Name: Tamara [name] MRN: [id number] DOB: 08/04/1961 Today's Date: 03/03/2020   History of Present Illness  Tamara [name] is a 59 y.o. female with medical history significant for type II DM, hyperlipidemia and asthma who presents to the emergency department via EMS due to altered mental status and unwitnessed fall. 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 Neurology recommending Keppra  Clinical Impression   Pt admitted with above diagnosis. Comes from home where she lives in her single level apartment with a few steps to enter; tells me her boyfriend stays with her; Meredeth Ide without an assistive device at baseline; no longer driving; Presents to PT with significant memory and cognitive deficits;  Pt currently with functional limitations due to the deficits listed below (see PT Problem List). Pt will benefit from skilled PT to increase their independence and safety with mobility to allow discharge to the venue listed below.       Follow Up Recommendations Home health PT; HHST/HHOT; Other (comment) (Not sure that her insurance status will support HHPT follow up; I'm concerned for her safety at home, especially with med management -- Are there any kind of community case management resources that she can get hooked into?)    Equipment Recommendations  None recommended by PT    Recommendations for Other Services       Precautions / Restrictions Precautions Precautions: Fall Restrictions Weight Bearing Restrictions: No      Mobility  Bed Mobility Overal bed mobility: Needs Assistance Bed Mobility: Supine to Sit     Supine to sit: Supervision     General bed mobility comments: supervision with cues for sequencing  Transfers Overall transfer level: Needs assistance Equipment used: None Transfers: Sit to/from Stand Sit to Stand: Min guard          General transfer comment: Min guard for safety  Ambulation/Gait Ambulation/Gait assistance: Min guard;Supervision Gait Distance (Feet): 175 Feet Assistive device: None Gait Pattern/deviations: Step-through pattern     General Gait Details: Minguard at first, progressing to Supervision; Occasional erratic step width, but able to correct without physical assist; no overt loss of balance  Stairs Stairs: Yes Stairs assistance: Min guard Stair Management: No rails;Forwards;Alternating pattern Number of Stairs: 5 General stair comments: One small loss of balanc efrom which she recovered without physical assist  Wheelchair Mobility    Modified Rankin (Stroke Patients Only)       Balance Overall balance assessment: Needs assistance Sitting-balance support: No upper extremity supported;Feet supported Sitting balance-Leahy Scale: Good     Standing balance support: During functional activity;No upper extremity supported Standing balance-Leahy Scale: Fair Standing balance comment: noted small LOB with ambulation and reaching for environmental supports                             Pertinent Vitals/Pain Pain Assessment: No/denies pain    Home Living Family/patient expects to be discharged to:: Private residence Living Arrangements: Other (Comment) (Boyfriend) Available Help at Discharge: Available PRN/intermittently;Family;Friend(s) Type of Home: Apartment Home Access: Stairs to enter   Entrance Stairs-Number of Steps: 3-5 Home Layout: One level   Additional Comments: Pt limited historian with AMS. No family present to confirm home set up    Prior Function Level of Independence: Needs assistance   Gait / Transfers Assistance Needed: No need for assistive device at baseline  ADL's / Homemaking Assistance Needed:  Some assist with ADLs and IADLs from boyfriend per pt report  Comments: Pt limited historian with AMS. No family present to confirm PLOF. Pt reports no  longer driving     Hand Dominance   Dominant Hand: Right    Extremity/Trunk Assessment   Upper Extremity Assessment Upper Extremity Assessment: Defer to OT evaluation    Lower Extremity Assessment Lower Extremity Assessment: RLE deficits/detail;LLE deficits/detail RLE Coordination: decreased gross motor LLE Coordination: decreased gross motor    Cervical / Trunk Assessment Cervical / Trunk Assessment: Kyphotic  Communication   Communication: No difficulties  Cognition Arousal/Alertness: Awake/alert Behavior During Therapy: Impulsive;Flat affect Overall Cognitive Status: No family/caregiver present to determine baseline cognitive functioning Area of Impairment: Orientation;Attention;Following commands;Memory;Safety/judgement;Problem solving;Awareness                 Orientation Level: Disoriented to;Time;Situation Current Attention Level: Focused Memory: Decreased short-term memory Following Commands: Follows one step commands inconsistently Safety/Judgement: Decreased awareness of deficits;Decreased awareness of safety Awareness: Intellectual Problem Solving: Difficulty sequencing;Requires verbal cues;Requires tactile cues General Comments: Significant short-term memory deficits; Repeats her request for ice water and applesauce quite a lot; unable to recall 3 words given to her over 5 min time frame      General Comments General comments (skin integrity, edema, etc.): Pt reports she feels good to go home despite noted cognitive deficits    Exercises     Assessment/Plan    PT Assessment Patient needs continued PT services  PT Problem List Decreased cognition;Decreased activity tolerance;Decreased balance;Decreased coordination;Decreased knowledge of use of DME;Decreased safety awareness;Decreased knowledge of precautions       PT Treatment Interventions DME instruction;Gait training;Stair training;Functional mobility training;Therapeutic activities;Therapeutic  exercise;Balance training;Neuromuscular re-education;Cognitive remediation;Patient/family education    PT Goals (Current goals can be found in the Care Plan section)  Acute Rehab PT Goals Patient Stated Goal: go home PT Goal Formulation: Patient unable to participate in goal setting Time For Goal Achievement: 03/17/20 Potential to Achieve Goals: Good    Frequency Min 3X/week   Barriers to discharge        Co-evaluation               AM-PAC PT "6 Clicks" Mobility  Outcome Measure Help needed turning from your back to your side while in a flat bed without using bedrails?: None Help needed moving from lying on your back to sitting on the side of a flat bed without using bedrails?: None Help needed moving to and from a bed to a chair (including a wheelchair)?: None Help needed standing up from a chair using your arms (e.g., wheelchair or bedside chair)?: None Help needed to walk in hospital room?: A Little Help needed climbing 3-5 steps with a railing? : A Little 6 Click Score: 22    End of Session Equipment Utilized During Treatment: Gait belt Activity Tolerance: Patient tolerated treatment well Patient left: in chair;with call bell/phone within reach;with chair alarm set Nurse Communication: Mobility status PT Visit Diagnosis: Unsteadiness on feet (R26.81);Other abnormalities of gait and mobility (R26.89);Other symptoms and signs involving the nervous system (R29.898)    Time: 5427-0623 PT Time Calculation (min) (ACUTE ONLY): 29 min   Charges:   PT Evaluation $PT Eval Moderate Complexity: 1 Mod PT Treatments $Gait Training: 8-22 mins        Van Clines, PT  Acute Rehabilitation Services Pager [phone] Office [phone]   Levi Aland 03/03/2020, 12:33 PM

## 2020-03-03 NOTE — TOC Progression Note (Signed)
Transition of Care [facility]) - Progression Note    Patient Details  Name: Tamara [name] MRN: [id number] Date of Birth: [birth year]  Transition of Care [facility]) CM/SW Contact  Kermit Balo, RN Phone Number: 03/03/2020, 3:33 PM  Clinical Narrative:    Plan is for patient to d/c to sister, Anita's home in the am. CM has verified this with [name] and pt is reluctant to go stay with sister but is agreeing.  CM has provided her information to Cassie with [facility] for charity [facility] services.  New medications for home sent to [facility] pharmacy and CM will cover the cost.  Pt's sister to provide transport home for her around 9 am.    Expected Discharge Plan: Home w Home Health Services Barriers to Discharge: Inadequate or no insurance, Barriers Unresolved (comment)  Expected Discharge Plan and Services Expected Discharge Plan: Home w Home Health Services   Discharge Planning Services: CM Consult Post Acute Care Choice: Home Health Living arrangements for the past 2 months: Apartment                           HH Arranged: PT, OT, Speech Therapy HH Agency: [facility] (charity services) Date HH Agency Contacted: 03/03/20   Representative spoke with at [facility] Agency: CAssie   Social Determinants of Health (SDOH) Interventions    Readmission Risk Interventions No flowsheet data found.

## 2020-03-03 NOTE — Progress Notes (Signed)
  Speech Language Pathology Treatment: Dysphagia  Patient Details Name: Tamara [name] MRN: [id number] DOB: December 05, 1960 Today's Date: 03/03/2020 Time: 3329-5188 SLP Time Calculation (min) (ACUTE ONLY): 10 min  Assessment / Plan / Recommendation Clinical Impression  Pt was seen for dysphagia treatment and was cooperative throughout the session. Pt and nursing reported that the pt has been tolerating the current diet without overt s/sx of aspiration. However, she has only been eating apple sauce, thin liquids, and ice chips. Pt reported that foods which are not cold "fill" her and she has difficulty  "swallowing them up" because they get to her stomach and fill her. Pt tolerated puree solids and thin liquids via straw using consecutive swallows without symptoms of oropharyngeal dysphagia. She refused additional boluses despite encourgement and stated that if I were to go to her fridge at her home I would only see liquids and apple sauce because that is all she eats. SLP services will be discontinued at this time since her swallow mechanism appears WNL for consistencies which pt will accept. Please re-consult if clinically indicated.    HPI HPI: Pt is a 59 y.o. female with medical history significant for type II DM, hyperlipidemia and asthma who presented to the emergency department via EMS due to altered mental status and unwitnessed fall. MRI brain: Asymmetric atrophy with associated FLAIR signal abnormality involving the anterior and mid left temporal lobe, of uncertain etiology, but suggesting a chronic process, possibly related to underlying seizure disorder. EEG normal. Esophagram: Mild esophageal dysmotility, suggestive of chronic reflux related.      SLP Plan  All goals met;Discharge SLP treatment due to (comment)       Recommendations  Diet recommendations: Regular;Thin liquid Liquids provided via: Straw;Cup Medication Administration: Whole meds with liquid Supervision: Patient able to  self feed Compensations: Slow rate;Small sips/bites Postural Changes and/or Swallow Maneuvers: Seated upright 90 degrees                Oral Care Recommendations: Oral care BID Follow up Recommendations: None SLP Visit Diagnosis: Dysphagia, unspecified (R13.10) Plan: All goals met;Discharge SLP treatment due to (comment)       Ambrose Wile I. Hardin Negus, [place], [place] Office number [phone] Pager [place] 03/03/2020, 9:49 AM

## 2020-03-03 NOTE — Progress Notes (Signed)
PROGRESS NOTE    [name]  JEH:[id number] DOB: 1961/06/26 DOA: 02/29/2020 PCP: [name], NP      Brief Narrative:  Ms Pascuzzi is a 59 y.o. F with DM, asthma, CKD 3B noncompliant with care who presented with being found down.  Per report, she was found down at local [employer].       Assessment & Plan:  Acute metabolic encephalopathy Unresponsive episode Discussed with family, at baseline, the patient has been showing signs of forgetfulness and cognitive impairment recently (repeating herself, asking things like "what are balloons?").  This has been a new thing over months.  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  EEG normal.  B12 normal, folate low, RPR nonreactive.  No evidence of uremia, hyperammonemia, medication overdose.  Neurology was consulted who recommended Keppra.  The patient's mental status rapidly improved, although she still appears altered, and is unable to articulate how to care for self at home at present.  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 -Continue sliding scale corrections -Continue aspirin and atorvastatin -Hold Lantus -Hold gabapentin  Folate deficiency -Continue folate  Hypertension Blood pressure normal -Hold losartan  Asthma -Continue Dulera   depression -Hold Wellbutrin  Hypernatremia  Anemia Mild, likely dilutional, normocytic -Follow up with PCP          Disposition: Status is: Inpatient  Remains inpatient appropriate because:she remains confused relative to her baseline, we still seek a safe disposition   Dispo: The patient is from: Home              Anticipated d/c is to: SNF              Anticipated d/c date is: 1 day              Patient currently is not medically stable to  d/c.              MDM: The below labs and imaging reports were reviewed and summarized above.  Medication management as above.    DVT prophylaxis: SCDs Start: 03/01/20 0031  Code Status: FULL Family Communication: Sisters at bedside    Consultants:   Neurology  Procedures:     Antimicrobials:      Culture data:              Subjective: Patient is still somewhat confused, although improving.  She is oriented to self, able to oriented to place when prompted.      Objective: Vitals:   03/02/20 2004 03/03/20 0018 03/03/20 0422 03/03/20 0929  BP: 129/81 114/80 (!) 152/90   Pulse: (!) 59 65 (!) 59   Resp: 16 18 18    Temp: 98.6 F (37 C) 98.1 F (36.7 C) 98.1 F (36.7 C)   TempSrc: Oral Oral Oral   SpO2: 100% 100% 100% 100%  Weight:      Height:        Intake/Output Summary (Last 24 hours) at 03/03/2020 1917 Last data filed at 03/03/2020 05/04/2020 Gross per 24 hour  Intake 1201.06 ml  Output --  Net 1201.06 ml   Filed Weights   03/01/20 0650  Weight: 69.1 kg    Examination: General appearance:  adult female, alert and in no acute distress.  Sitting up in recliner HEENT: Anicteric, conjunctiva pink, lids and lashes normal. No nasal deformity, discharge, epistaxis.  Lips moist, dentures in place, oropharynx moist, no oral lesions, hearing normal.   Skin: Warm and dry.   No suspicious rashes or lesions. Cardiac: RRR, nl S1-S2, no murmurs appreciated.  Capillary refill is brisk.  JVP normal.  no LE edema.  Radial  pulses 2+ and symmetric. Respiratory: Normal respiratory rate and rhythm.  CTAB without rales or wheezes. Abdomen: Abdomen soft.  No TTP or guarding. No ascites, distension, hepatosplenomegaly.   MSK: No deformities or effusions. Neuro: Awake and alert, but psychomotor slowing is noted.  EOMI, moves all extremities with 5/5 strength, symmetric coordination. Speech fluent.    Psych: Sensorium intact and responding to questions,  attention diminished. Affect blunted.  Judgment and insight appear moderately impaired.    Data Reviewed: I have personally reviewed following labs and imaging studies:  CBC: Recent Labs  Lab 02/29/20 1839 02/29/20 1859 03/01/20 0724 03/02/20 0757 03/03/20 0459  WBC 12.1*  --  11.0* 9.3 8.5  NEUTROABS 8.7*  --   --  6.0 5.2  HGB 13.0 12.2 12.5 10.1* 9.9*  HCT 40.1 36.0 37.8 30.9* 30.6*  MCV 89.5  --  89.8 89.3 89.2  PLT 443*  --  437* 337 316   Basic Metabolic Panel: Recent Labs  Lab 02/29/20 1839 02/29/20 1859 03/01/20 0724 03/02/20 0757 03/03/20 0459  NA 141 149* 148* 147* 144  K 4.2 3.5 3.3* 3.3* 3.8  CL 111  --  116* 115* 112*  CO2 18*  --  21* 22 24  GLUCOSE 167*  --  145* 121* 121*  BUN 62*  --  48* 27* 18  CREATININE 2.12*  --  1.59* 1.33* 1.12*  CALCIUM 10.1  --  9.6 9.4 9.4  MG 2.5*  --   --  1.9 1.8  PHOS  --   --   --  2.1* 2.5   GFR: Estimated Creatinine Clearance: 53.9 mL/min (A) (by C-G formula based on SCr of 1.12 mg/dL (H)). Liver Function Tests: Recent Labs  Lab 02/29/20 1839 03/01/20 0724 03/02/20 0757 03/03/20 0459  AST 26 27 20 21   ALT 24 29 24 24   ALKPHOS 75 73 57 58  BILITOT 1.1 0.7 0.8 0.5  PROT 8.2* 7.8 6.7 6.5  ALBUMIN 4.3 3.9 3.4* 3.3*   No results for input(s): LIPASE, AMYLASE in the last 168 hours. Recent Labs  Lab 02/29/20 1850 03/01/20 0724 03/02/20 0757  AMMONIA 79* 14 12   Coagulation Profile: Recent Labs  Lab 03/01/20 0724  INR 1.2   Cardiac Enzymes: No results for input(s): CKTOTAL, CKMB, CKMBINDEX, TROPONINI in the last 168 hours. BNP (last 3 results) No results for input(s): PROBNP in the last 8760 hours. HbA1C: Recent Labs    03/01/20 0724  HGBA1C 6.2*   CBG: Recent Labs  Lab 03/02/20 1610 03/02/20 2156 03/03/20 0609 03/03/20 1257 03/03/20 1749  GLUCAP 117* 139* 123* 126* 133*   Lipid Profile: No results for input(s): CHOL, HDL, LDLCALC, TRIG, CHOLHDL, LDLDIRECT in the last 72  hours. Thyroid Function Tests: No results for input(s): TSH, T4TOTAL, FREET4, T3FREE, THYROIDAB in the last 72 hours. Anemia Panel: Recent Labs    03/01/20 0724  VITAMINB12 447  FOLATE 5.8*   Urine analysis:    Component Value Date/Time   COLORURINE YELLOW 02/29/2020 2233   APPEARANCEUR CLEAR 02/29/2020 2233   LABSPEC 1.015 02/29/2020 2233   PHURINE 6.0 02/29/2020 2233   GLUCOSEU NEGATIVE 02/29/2020 2233   HGBUR SMALL (A) 02/29/2020 2233   BILIRUBINUR NEGATIVE 02/29/2020 2233  KETONESUR 15 (A) 02/29/2020 2233   PROTEINUR 30 (A) 02/29/2020 2233   UROBILINOGEN 1.0 08/07/2007 0800   NITRITE NEGATIVE 02/29/2020 2233   LEUKOCYTESUR NEGATIVE 02/29/2020 2233   Sepsis Labs: @LABRCNTIP (procalcitonin:4,lacticacidven:4)  ) Recent Results (from the past 240 hour(s))  SARS Coronavirus 2 by RT PCR (hospital order, performed in [facility] hospital lab) Nasopharyngeal Nasopharyngeal Swab     Status: None   Collection Time: 02/29/20 11:12 PM   Specimen: Nasopharyngeal Swab  Result Value Ref Range Status   SARS Coronavirus 2 NEGATIVE NEGATIVE Final    Comment: (NOTE) SARS-CoV-2 target nucleic acids are NOT DETECTED.  The SARS-CoV-2 RNA is generally detectable in upper and lower respiratory specimens during the acute phase of infection. The lowest concentration of SARS-CoV-2 viral copies this assay can detect is 250 copies / mL. A negative result does not preclude SARS-CoV-2 infection and should not be used as the sole basis for treatment or other patient management decisions.  A negative result may occur with improper specimen collection / handling, submission of specimen other than nasopharyngeal swab, presence of viral mutation(s) within the areas targeted by this assay, and inadequate number of viral copies (<250 copies / mL). A negative result must be combined with clinical observations, patient history, and epidemiological information.  Fact Sheet for Patients:    05/01/20  Fact Sheet for Healthcare Providers: BoilerBrush.com.cy  This test is not yet approved or  cleared by the https://pope.com/ FDA and has been authorized for detection and/or diagnosis of SARS-CoV-2 by FDA under an Emergency Use Authorization (EUA).  This EUA will remain in effect (meaning this test can be used) for the duration of the COVID-19 declaration under Section 564(b)(1) of the Act, 21 U.S.C. section 360bbb-3(b)(1), unless the authorization is terminated or revoked sooner.  Performed at [facility], [address] [place], [place]          Radiology Studies: CT ABDOMEN PELVIS WO CONTRAST  Result Date: 03/01/2020 CLINICAL DATA:  Nausea, vomiting, altered mental status and her with this fall EXAM: CT ABDOMEN AND PELVIS WITHOUT CONTRAST TECHNIQUE: Multidetector CT imaging of the abdomen and pelvis was performed following the standard protocol without IV contrast. COMPARISON:  CT abdomen pelvis 02/15/2020 FINDINGS: Lower chest: Atelectatic changes in the otherwise clear lung base. Normal heart size. No pericardial effusion. Hepatobiliary: No visible liver lesions or direct injury on this unenhanced CT. Smooth surface contour. Normal attenuation of the liver parenchyma. Layering density within the gallbladder may reflect biliary sludge. No biliary ductal dilatation or visible calcified gallstones. Pancreas: Unremarkable. No pancreatic ductal dilatation or surrounding inflammatory changes. Spleen: No visible direct injury or perisplenic hemorrhage. Normal in size without focal abnormality. Adrenals/Urinary Tract: Normal adrenal glands. Kidneys symmetric in size and normally positioned. No perinephric hemorrhage or other signs of direct injury. No visible or contour deforming renal lesions. No urolithiasis or hydronephrosis. Small amount of gas present within the urinary bladder, nonspecific. Stomach/Bowel:  Distal esophagus, stomach and duodenal sweep are unremarkable. No small bowel wall thickening or dilatation. No evidence of obstruction. A normal appendix is visualized. No colonic dilatation or wall thickening. Vascular/Lymphatic: Atherosclerotic calcifications within the abdominal aorta and branch vessels. No aneurysm or ectasia. No enlarged abdominopelvic lymph nodes. Reproductive: Several partially calcified fibroids present within the uterus, largest a subserosal fibroid extending from the left uterine fundus measuring up to 2.9 cm in size. Mild uterine retroversion. No concerning adnexal lesions. Other: No abdominopelvic free fluid or free gas. No bowel containing  hernias. Musculoskeletal: No acute osseous or soft tissue abnormality. IMPRESSION: 1. This exam is limited by the absence of intravenous contrast media. The use of intravenous contrast media aids in the detection of subtle though often significant abnormalities including visceral, vascular and soft tissue injuries which would otherwise be occult. 2. No visible traumatic injuries in the included abdomen or pelvis. 3. Small amount of gas within the urinary bladder, nonspecific, but can be seen with recent instrumentation. Correlate with urinalysis to exclude cystitis. 4. Hyperdense material layering in the gallbladder, possible biliary sludge. No CT features of acute cholecystitis or biliary ductal dilatation. However if there is continued clinical concern, right upper quadrant ultrasound could be obtained. 5. No other acute findings in the abdomen or pelvis to provide cause for patient's symptoms. 6. Fibroid uterus. 7. Aortic Atherosclerosis (ICD10-I70.0). Electronically Signed   By: Kreg Shropshire M.D.   On: 03/01/2020 20:05   DG ESOPHAGUS W SINGLE CM (SOL OR THIN BA)  Result Date: 03/02/2020 CLINICAL DATA:  Inpatient with encephalopathy, nausea and vomiting. Reported Prilosec at home. Diabetes mellitus. EXAM: ESOPHOGRAM/BARIUM SWALLOW TECHNIQUE:  Single contrast examination was performed using  thin barium. FLUOROSCOPY TIME:  Fluoroscopy Time:  2 minutes 0 seconds Radiation Exposure Index (if provided by the fluoroscopic device): 9.7 mGy Number of Acquired Spot Images: 1 COMPARISON:  03/01/2020 CT abdomen/pelvis. FINDINGS: Examination limited by patient's encephalopathy. Examination conducted with the patient supine with elevated head position. Grossly normal oral and pharyngeal phase of swallowing, with no evidence of laryngeal penetration or tracheobronchial aspiration. Mild esophageal dysmotility, characterized by intermittent weakening of primary peristalsis in the mid to lower thoracic esophagus. No hiatal hernia. No gastroesophageal reflux elicited despite provocative maneuvers including water siphon test. No evidence of esophageal mass, stricture or ulcer. Barium tablet traversed the esophagus into the stomach without significant delay. IMPRESSION: 1. Limited examination, see comments. 2. Mild esophageal dysmotility, suggestive of chronic reflux related dysmotility pattern. 3. No hiatal hernia.  No gastroesophageal reflux elicited. 4. No gross evidence of esophageal mass or stricture. Electronically Signed   By: Delbert Phenix M.D.   On: 03/02/2020 09:17        Scheduled Meds: . aspirin EC  81 mg Oral Daily  . atorvastatin  40 mg Oral QHS  . folic acid  1 mg Oral Daily  . insulin aspart  0-5 Units Subcutaneous QHS  . insulin aspart  0-9 Units Subcutaneous TID WC  . lactulose  20 g Oral Daily  . mometasone-formoterol  2 puff Inhalation BID  . montelukast  10 mg Oral QHS  . pantoprazole  40 mg Oral Daily   Continuous Infusions: . levETIRAcetam 500 mg (03/03/20 0602)     LOS: 2 days    Time spent: 25 minutes    Alberteen Sam, MD [facility] 03/03/2020, 7:17 PM     Please page though AMION or Epic secure chat:  For [employer], Higher education careers adviser

## 2020-03-04 LAB — CBC
HCT: 29.1 % — ABNORMAL LOW (ref 36.0–46.0)
Hemoglobin: 9.7 g/dL — ABNORMAL LOW (ref 12.0–15.0)
MCH: 29.5 pg (ref 26.0–34.0)
MCHC: 33.3 g/dL (ref 30.0–36.0)
MCV: 88.4 fL (ref 80.0–100.0)
Platelets: 302 10*3/uL (ref 150–400)
RBC: 3.29 MIL/uL — ABNORMAL LOW (ref 3.87–5.11)
RDW: 12.7 % (ref 11.5–15.5)
WBC: 8.2 10*3/uL (ref 4.0–10.5)
nRBC: 0 % (ref 0.0–0.2)

## 2020-03-04 LAB — COMPREHENSIVE METABOLIC PANEL
ALT: 23 U/L (ref 0–44)
AST: 21 U/L (ref 15–41)
Albumin: 3.3 g/dL — ABNORMAL LOW (ref 3.5–5.0)
Alkaline Phosphatase: 64 U/L (ref 38–126)
Anion gap: 9 (ref 5–15)
BUN: 14 mg/dL (ref 6–20)
CO2: 24 mmol/L (ref 22–32)
Calcium: 9.6 mg/dL (ref 8.9–10.3)
Chloride: 108 mmol/L (ref 98–111)
Creatinine, Ser: 1.18 mg/dL — ABNORMAL HIGH (ref 0.44–1.00)
GFR calc Af Amer: 59 mL/min — ABNORMAL LOW (ref >60)
GFR calc non Af Amer: 51 mL/min — ABNORMAL LOW (ref >60)
Glucose, Bld: 135 mg/dL — ABNORMAL HIGH (ref 70–99)
Potassium: 3.2 mmol/L — ABNORMAL LOW (ref 3.5–5.1)
Sodium: 141 mmol/L (ref 135–145)
Total Bilirubin: 0.9 mg/dL (ref 0.3–1.2)
Total Protein: 6.5 g/dL (ref 6.5–8.1)

## 2020-03-04 LAB — GLUCOSE, CAPILLARY
Glucose-Capillary: 116 mg/dL — ABNORMAL HIGH (ref 70–99)
Glucose-Capillary: 107 mg/dL — ABNORMAL HIGH (ref 70–99)

## 2020-03-04 MED ORDER — GABAPENTIN 300 MG PO CAPS: 300.0000 mg | ORAL_CAPSULE | Freq: Three times a day (TID) | ORAL | 3 refills | Status: DC

## 2020-03-04 MED ORDER — FOLIC ACID 1 MG PO TABS: 1.0000 mg | ORAL_TABLET | Freq: Every day | ORAL | 3 refills | Status: DC

## 2020-03-04 MED ORDER — POTASSIUM CHLORIDE CRYS ER 20 MEQ PO TBCR
40.0000 meq | EXTENDED_RELEASE_TABLET | Freq: Once | ORAL | Status: AC
  Administered 2020-03-04: 40 meq via ORAL
  Filled 2020-03-04: qty 2

## 2020-03-04 MED FILL — FOLIC ACID 1 MG TABS: 1 | 30 days supply | Qty: 30 | Fill #0

## 2020-03-04 MED FILL — GABAPENTIN 300 MG CAPSULE: 300 | 30 days supply | Qty: 90 | Fill #0

## 2020-03-04 NOTE — Progress Notes (Signed)
NURSING PROGRESS NOTE  Tamara [name] [id number] Discharge Data: 03/04/2020 11:15 AM Attending Provider: Alberteen Sam, * NLG:[id number], Chales Abrahams, NP     [name] discharged to her residence per MD order.  Discussed with the patient and [name] at pt bedside the After Visit Summary and all questions fully answered. All IV's discontinued with no bleeding noted. All belongings returned to patient for patient to take home. Pt left by private vehicle with [name].  Last Vital Signs:  Blood pressure 131/63, pulse (!) 55, temperature 97.7 F (36.5 C), temperature source Oral, resp. rate 16, height 5\' 7"  (1.702 m), weight 69.1 kg, SpO2 100 %.  Discharge Medication List Allergies as of 03/04/2020   No Known Allergies     Medication List    STOP taking these medications   Acetaminophen Extra Strength 500 MG tablet Generic drug: acetaminophen   buPROPion 150 MG 12 hr tablet Commonly known as: WELLBUTRIN SR   hydrOXYzine 25 MG capsule Commonly known as: VISTARIL   Naprosyn 500 MG tablet Generic drug: naproxen     TAKE these medications   albuterol 108 (90 Base) MCG/ACT inhaler Commonly known as: VENTOLIN HFA Inhale 2 puffs into the lungs every 6 (six) hours as needed for wheezing.   aspirin EC 81 MG tablet Take 81 mg by mouth daily.   atorvastatin 80 MG tablet Commonly known as: LIPITOR Take 40 mg by mouth at bedtime.   Fluticasone-Salmeterol 250-50 MCG/DOSE Aepb Commonly known as: ADVAIR Inhale 1 puff into the lungs 2 (two) times daily.   folic acid 1 MG tablet Commonly known as: FOLVITE Take 1 tablet (1 mg total) by mouth daily.   gabapentin 300 MG capsule Commonly known as: NEURONTIN Take 1 capsule (300 mg total) by mouth 3 (three) times daily. What changed: Another medication with the same name was removed. Continue taking this medication, and follow the directions you see here.   Lantus SoloStar 100 UNIT/ML Solostar Pen Generic drug:  insulin glargine Inject 20 Units into the skin 2 (two) times daily.   levETIRAcetam 500 MG tablet Commonly known as: Keppra Take 1 tablet (500 mg total) by mouth 2 (two) times daily.   loratadine 10 MG tablet Commonly known as: CLARITIN Take 1 tablet (10 mg total) by mouth daily.   losartan 100 MG tablet Commonly known as: COZAAR Take 100 mg by mouth daily.   magnesium 30 MG tablet Take 1 tablet (30 mg total) by mouth daily.   metFORMIN 1000 MG tablet Commonly known as: GLUCOPHAGE Take 1,000 mg by mouth 2 (two) times daily with a meal.   MiraLax 17 GM/SCOOP powder Generic drug: polyethylene glycol powder Take 17 g by mouth at bedtime. What changed: Another medication with the same name was removed. Continue taking this medication, and follow the directions you see here.   montelukast 10 MG tablet Commonly known as: SINGULAIR Take 1 tablet (10 mg total) by mouth at bedtime.   Nasonex 50 MCG/ACT nasal spray Generic drug: mometasone Place 1 spray into the nose 2 (two) times daily.   omeprazole 20 MG capsule Commonly known as: PRILOSEC Take 20 mg by mouth daily.

## 2020-03-04 NOTE — Evaluation (Signed)
Physical Therapy Evaluation Patient Details Name: Tamara [name] MRN: [id number] DOB: [birth year] Today's Date: 03/04/2020   History of Present Illness  Tamara [name] is a 59 y.o. female with medical history significant for type II DM, hyperlipidemia and asthma who presents to the emergency department via EMS due to altered mental status and unwitnessed fall. 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 Neurology recommending Keppra  Clinical Impression  Pt progressing towards physical therapy goals. From a mobility standpoint, pt is doing well and requires occasional min guard assist for balance support and safety. Continue to recommend 24 hour assistance at home for IADL's such as medication management, cooking (especially using the stove/oven), and to keep pt from driving. She continues to demonstrate cognitive deficits and had difficulty finding the soap dispenser to wash her hands in the room, walking and talking at the same time, counting activity while ambulating, and with path finding. Pt perseverating on when she can go home, and not able to identify that she was asking the therapist the same questions over and over again throughout the session. Sister present and states that between her and another sister, they will check on the patient and boyfriend at home to ensure the pt is in a safe situation. Will continue to follow and progress as able per POC.     Follow Up Recommendations Home health PT;Supervision/Assistance - 24 hour    Equipment Recommendations  None recommended by PT    Recommendations for Other Services       Precautions / Restrictions Precautions Precautions: Fall Restrictions Weight Bearing Restrictions: No      Mobility  Bed Mobility Overal bed mobility: Needs Assistance Bed Mobility: Supine to Sit     Supine to sit: Supervision     General bed mobility comments:  supervision with cues for sequencing  Transfers Overall transfer level: Needs assistance Equipment used: None Transfers: Sit to/from Stand Sit to Stand: Min guard         General transfer comment: Min guard for safety  Ambulation/Gait Ambulation/Gait assistance: Min guard;Supervision Gait Distance (Feet): 550 Feet Assistive device: None Gait Pattern/deviations: Step-through pattern Gait velocity: Decreased Gait velocity interpretation: <1.8 ft/sec, indicate of risk for recurrent falls General Gait Details: Occasional min guard assist provided for balance. Pt with varying gait speed throughout and very slow when talking or attempting to complete a cognitive task at the same time. Pt required cues and increased time for backwards counting and path finding activity during gait training.   Stairs Stairs: Yes Stairs assistance: Min guard Stair Management: Forwards;Alternating pattern;Two rails Number of Stairs: 5 General stair comments: No assist required  Wheelchair Mobility    Modified Rankin (Stroke Patients Only)       Balance Overall balance assessment: Needs assistance Sitting-balance support: No upper extremity supported;Feet supported Sitting balance-Leahy Scale: Good     Standing balance support: During functional activity;No upper extremity supported Standing balance-Leahy Scale: Fair Standing balance comment: grossly                             Pertinent Vitals/Pain Pain Assessment: No/denies pain    Home Living                        Prior Function                 Hand Dominance  Extremity/Trunk Assessment                Communication      Cognition Arousal/Alertness: Awake/alert Behavior During Therapy: Impulsive;Flat affect Overall Cognitive Status: No family/caregiver present to determine baseline cognitive functioning Area of Impairment: Attention;Memory;Safety/judgement;Awareness;Problem solving                  Orientation Level: Disoriented to;Time;Situation Current Attention Level: Focused Memory: Decreased short-term memory Following Commands: Follows one step commands inconsistently Safety/Judgement: Decreased awareness of deficits;Decreased awareness of safety Awareness: Intellectual Problem Solving: Difficulty sequencing;Requires verbal cues;Requires tactile cues        General Comments      Exercises     Assessment/Plan    PT Assessment    PT Problem List         PT Treatment Interventions      PT Goals (Current goals can be found in the Care Plan section)  Acute Rehab PT Goals Patient Stated Goal: go home PT Goal Formulation: Patient unable to participate in goal setting Time For Goal Achievement: 03/17/20 Potential to Achieve Goals: Good    Frequency Min 3X/week   Barriers to discharge        Co-evaluation               AM-PAC PT "6 Clicks" Mobility  Outcome Measure Help needed turning from your back to your side while in a flat bed without using bedrails?: None Help needed moving from lying on your back to sitting on the side of a flat bed without using bedrails?: None Help needed moving to and from a bed to a chair (including a wheelchair)?: None Help needed standing up from a chair using your arms (e.g., wheelchair or bedside chair)?: None Help needed to walk in hospital room?: A Little Help needed climbing 3-5 steps with a railing? : A Little 6 Click Score: 22    End of Session Equipment Utilized During Treatment: Gait belt Activity Tolerance: Patient tolerated treatment well Patient left: with call bell/phone within reach;in bed;with bed alarm set;with family/visitor present Nurse Communication: Mobility status PT Visit Diagnosis: Unsteadiness on feet (R26.81);Other abnormalities of gait and mobility (R26.89);Other symptoms and signs involving the nervous system (R29.898)    Time: 5329-9242 PT Time Calculation (min) (ACUTE  ONLY): 33 min   Charges:     PT Treatments $Gait Training: 8-22 mins $Self Care/Home Management: 8-22        Conni Slipper, PT, DPT Acute Rehabilitation Services Pager: [phone] Office: [phone]   Marylynn Pearson 03/04/2020, 3:31 PM

## 2020-03-04 NOTE — Progress Notes (Signed)
Occupational Therapy Treatment Patient Details Name: Tamara [name] MRN: [id number] DOB: 02-Sep-1960 Today's Date: 03/04/2020    History of present illness Tamara [name] is a 59 y.o. female with medical history significant for type II DM, hyperlipidemia and asthma who presents to the emergency department via EMS due to altered mental status and unwitnessed fall. 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 Neurology recommending Keppra   OT comments  Pt seen as physician request to discuss safety and awareness in the home upon dc. Upon arrival, pt sitting upright in bed stating repeatedly that she did not want to go home with sister. Pt agreeable to education on safety in the home. Pt and therapist read packet and therapist emphasized compensatory techniques for memory and emphasis on no driving and supervision with meds, cooking, and going out in the community. Reported that she does not want to go home with her sister. Followed up with nurse and case management. Believe pt would benefit from [facility] and highly recommend 24/7 supervision due to cognitive deficits and needed assist with driving, medicine management, and cooking upon dc home.   Follow Up Recommendations  Supervision/Assistance - 24 hour;Home health OT    Equipment Recommendations  3 in 1 bedside commode       Precautions / Restrictions Precautions Precautions: Fall Restrictions Weight Bearing Restrictions: No       Mobility Bed Mobility                Pt sitting up in bed upon arrival.        ADL either performed or assessed with clinical judgement        Vision   Vision Assessment?: No apparent visual deficits  Able to read safety packet.          Cognition Arousal/Alertness: Awake/alert Behavior During Therapy: Impulsive;Flat affect Overall Cognitive Status: No family/caregiver present to determine baseline  cognitive functioning Area of Impairment: Attention;Memory;Safety/judgement;Awareness;Problem solving                   Current Attention Level: Focused Memory: Decreased short-term memory   Safety/Judgement: Decreased awareness of deficits;Decreased awareness of safety Awareness: Intellectual Problem Solving: Difficulty sequencing;Requires verbal cues;Requires tactile cues General Comments: Noted STM deficits. Poor attention when going over in-home safety packet with pt. Significantly unaware of deficits and safety with wanting to go home without supervision.               General Comments Pt's sister came in the room and pt told sister she wanted to go home to her house and sister replied, "Thats fine. I will drop you off at home."    Pertinent Vitals/ Pain       Pain Assessment: No/denies pain         Frequency  Min 2X/week        Progress Toward Goals  OT Goals(current goals can now be found in the care plan section)  Progress towards OT goals: Not progressing toward goals - comment  Acute Rehab OT Goals Patient Stated Goal: go home OT Goal Formulation: With patient Time For Goal Achievement: 03/17/20 Potential to Achieve Goals: Good  Plan Discharge plan needs to be updated       AM-PAC OT "6 Clicks" Daily Activity     Outcome Measure   Help from another person eating meals?: None Help from another person taking care of personal grooming?: A Little Help from another person  toileting, which includes using toliet, bedpan, or urinal?: A Little Help from another person bathing (including washing, rinsing, drying)?: A Little Help from another person to put on and taking off regular upper body clothing?: A Little Help from another person to put on and taking off regular lower body clothing?: A Little 6 Click Score: 19    End of Session    OT Visit Diagnosis: Unsteadiness on feet (R26.81);Muscle weakness (generalized) (M62.81);Other symptoms and signs  involving cognitive function   Activity Tolerance Patient tolerated treatment well   Patient Left in bed;with bed alarm set;with family/visitor present;with call bell/phone within reach   Nurse Communication Mobility status;Other (comment) (Med management)        Time: 0177-9390 OT Time Calculation (min): 17 min  Charges: OT General Charges $OT Visit: 1 Visit OT Treatments $Self Care/Home Management : 8-22 mins  Inigo Lantigua/OTS   Temika Sutphin 03/04/2020, 10:37 AM

## 2020-03-04 NOTE — Discharge Summary (Signed)
Physician Discharge Summary  Tamara [name] ZOX:[id number] DOB: October 11, 1960 DOA: 02/29/2020  PCP: [name], NP  Admit date: 02/29/2020 Discharge date: 03/04/2020  Admitted From: Found down at [facility]  Disposition:  Sister's home   Recommendations for Outpatient Follow-up:  1. Follow up with PCP Placey in 1 week 2. Chales Abrahams Placey:  1. Please ensure follow up with Neurology 2. please hold Wellbutrin 3. Please refer for geriatric or dementia evaluation     Home Health: PT/OT/SLP ordered, unable to obtain charity care  Equipment/Devices: None  Discharge Condition: Fair  CODE STATUS: FULL Diet recommendation: Diabetic  Brief/Interim Summary: Tamara [name] is a 59 y.o. F with DM, asthma, HTN who presented with being found unresponsive.  Per report, EMS was called to a local [facility], on arrival to the ED site, patient was laying on the pavement next a passenger's side car door, she had an episode of emesis and was incontinent of urine.  It was reported that patient's neighbor was present but could not provide any history.   In the ER, Creatinine 2.12 from baseline 1.7, hyperammonemia, and UDS negative.  Started on IV fluids and admitted.        PRINCIPAL HOSPITAL DIAGNOSIS: Seizure    Discharge Diagnoses:  Seizure Admitted and evaluated by neurology.  MRI showed asymmetric mesial temporal sclerosis, consistent with chronic recurrent seizures.  Uncharacteristically, family and patient reported no history of seizures, however the patient was started on Keppra, and had clinical improvement.  Wellbutrin was stopped Discharged on Keppra 500 twice daily, continued home gabapentin Follow-up with neurology in 1 month  Acute metabolic encephalopathy due to recurrent seizures Hyperammonemia, due to seizure No evidence of other synthetic liver dysfunction or stigmata of liver disease.  Doubt ammonia from cirrhosis.  Likely dementia Family report increasing forgetfulness over last  year. -Recommend outpatient neuropsychiatric evaluation/MMSE/SLUMS if available  Hypertension Continue losartan  Asthma without exacerbation Continue ICS LABA, Singulair  Diabetes Continue Lantus, Metformin, aspirin, atorvastatin          Discharge Instructions  Discharge Instructions    Ambulatory referral to Neurology   Complete by: As directed    An appointment is requested in approximately: 4 weeks   Diet - low sodium heart healthy   Complete by: As directed    Discharge instructions   Complete by: As directed    From Dr. Maryfrances Bunnell: You were admitted for a seizure We suspect you have had seizures for some time This is based on your brain MRI imaging that showed damage to a part of the brain (not from a stroke) but more likely from repeat seizures.  You should take the anti-seizure medicine Keppra 500 mg twice daily Continue your home gabapentin, but increase dose to 300 mg three times per day  Go see a Neurologist in 1 month (we have made this referral, they will contact you)  STOP taking bupropion/Wellbutrin  Go see your primary care doctor as soon as possible  START taking folic acid/folate 1 mg once daily for at least three months  Resume your home cholesterol medicine and Lantus/insulin   Increase activity slowly   Complete by: As directed      Allergies as of 03/04/2020   No Known Allergies     Medication List    STOP taking these medications   Acetaminophen Extra Strength 500 MG tablet Generic drug: acetaminophen   buPROPion 150 MG 12 hr tablet Commonly known as: WELLBUTRIN SR   hydrOXYzine 25 MG capsule Commonly known as:  VISTARIL   Naprosyn 500 MG tablet Generic drug: naproxen     TAKE these medications   albuterol 108 (90 Base) MCG/ACT inhaler Commonly known as: VENTOLIN HFA Inhale 2 puffs into the lungs every 6 (six) hours as needed for wheezing.   aspirin EC 81 MG tablet Take 81 mg by mouth daily.   atorvastatin 80 MG  tablet Commonly known as: LIPITOR Take 40 mg by mouth at bedtime.   Fluticasone-Salmeterol 250-50 MCG/DOSE Aepb Commonly known as: ADVAIR Inhale 1 puff into the lungs 2 (two) times daily.   folic acid 1 MG tablet Commonly known as: FOLVITE Take 1 tablet (1 mg total) by mouth daily.   gabapentin 300 MG capsule Commonly known as: NEURONTIN Take 1 capsule (300 mg total) by mouth 3 (three) times daily. What changed: Another medication with the same name was removed. Continue taking this medication, and follow the directions you see here.   Lantus SoloStar 100 UNIT/ML Solostar Pen Generic drug: insulin glargine Inject 20 Units into the skin 2 (two) times daily.   levETIRAcetam 500 MG tablet Commonly known as: Keppra Take 1 tablet (500 mg total) by mouth 2 (two) times daily.   loratadine 10 MG tablet Commonly known as: CLARITIN Take 1 tablet (10 mg total) by mouth daily.   losartan 100 MG tablet Commonly known as: COZAAR Take 100 mg by mouth daily.   magnesium 30 MG tablet Take 1 tablet (30 mg total) by mouth daily.   metFORMIN 1000 MG tablet Commonly known as: GLUCOPHAGE Take 1,000 mg by mouth 2 (two) times daily with a meal.   MiraLax 17 GM/SCOOP powder Generic drug: polyethylene glycol powder Take 17 g by mouth at bedtime. What changed: Another medication with the same name was removed. Continue taking this medication, and follow the directions you see here.   montelukast 10 MG tablet Commonly known as: SINGULAIR Take 1 tablet (10 mg total) by mouth at bedtime.   Nasonex 50 MCG/ACT nasal spray Generic drug: mometasone Place 1 spray into the nose 2 (two) times daily.   omeprazole 20 MG capsule Commonly known as: PRILOSEC Take 20 mg by mouth daily.       No Known Allergies  Consultations:  Neurology   Procedures/Studies: CT ABDOMEN PELVIS WO CONTRAST  Result Date: 03/01/2020 CLINICAL DATA:  Nausea, vomiting, altered mental status and her with this fall  EXAM: CT ABDOMEN AND PELVIS WITHOUT CONTRAST TECHNIQUE: Multidetector CT imaging of the abdomen and pelvis was performed following the standard protocol without IV contrast. COMPARISON:  CT abdomen pelvis 02/15/2020 FINDINGS: Lower chest: Atelectatic changes in the otherwise clear lung base. Normal heart size. No pericardial effusion. Hepatobiliary: No visible liver lesions or direct injury on this unenhanced CT. Smooth surface contour. Normal attenuation of the liver parenchyma. Layering density within the gallbladder may reflect biliary sludge. No biliary ductal dilatation or visible calcified gallstones. Pancreas: Unremarkable. No pancreatic ductal dilatation or surrounding inflammatory changes. Spleen: No visible direct injury or perisplenic hemorrhage. Normal in size without focal abnormality. Adrenals/Urinary Tract: Normal adrenal glands. Kidneys symmetric in size and normally positioned. No perinephric hemorrhage or other signs of direct injury. No visible or contour deforming renal lesions. No urolithiasis or hydronephrosis. Small amount of gas present within the urinary bladder, nonspecific. Stomach/Bowel: Distal esophagus, stomach and duodenal sweep are unremarkable. No small bowel wall thickening or dilatation. No evidence of obstruction. A normal appendix is visualized. No colonic dilatation or wall thickening. Vascular/Lymphatic: Atherosclerotic calcifications within the abdominal aorta and branch  vessels. No aneurysm or ectasia. No enlarged abdominopelvic lymph nodes. Reproductive: Several partially calcified fibroids present within the uterus, largest a subserosal fibroid extending from the left uterine fundus measuring up to 2.9 cm in size. Mild uterine retroversion. No concerning adnexal lesions. Other: No abdominopelvic free fluid or free gas. No bowel containing hernias. Musculoskeletal: No acute osseous or soft tissue abnormality. IMPRESSION: 1. This exam is limited by the absence of intravenous  contrast media. The use of intravenous contrast media aids in the detection of subtle though often significant abnormalities including visceral, vascular and soft tissue injuries which would otherwise be occult. 2. No visible traumatic injuries in the included abdomen or pelvis. 3. Small amount of gas within the urinary bladder, nonspecific, but can be seen with recent instrumentation. Correlate with urinalysis to exclude cystitis. 4. Hyperdense material layering in the gallbladder, possible biliary sludge. No CT features of acute cholecystitis or biliary ductal dilatation. However if there is continued clinical concern, right upper quadrant ultrasound could be obtained. 5. No other acute findings in the abdomen or pelvis to provide cause for patient's symptoms. 6. Fibroid uterus. 7. Aortic Atherosclerosis (ICD10-I70.0). Electronically Signed   By: Kreg Shropshire M.D.   On: 03/01/2020 20:05   CT Abdomen Pelvis Wo Contrast  Result Date: 02/15/2020 CLINICAL DATA:  59 year old female with abdominal distension and pain. EXAM: CT ABDOMEN AND PELVIS WITHOUT CONTRAST TECHNIQUE: Multidetector CT imaging of the abdomen and pelvis was performed following the standard protocol without IV contrast. COMPARISON:  CT abdomen pelvis dated 08/07/2007. FINDINGS: Evaluation of this exam is limited in the absence of intravenous contrast. Lower chest: The visualized lung bases are clear. No intra-abdominal free air or free fluid. Hepatobiliary: The liver is unremarkable. No intrahepatic biliary ductal dilatation. There is layering stone within the gallbladder. No pericholecystic fluid or evidence of acute cholecystitis by CT. Ultrasound may provide better evaluation if there is clinical concern for acute cholecystitis. Pancreas: Unremarkable. No pancreatic ductal dilatation or surrounding inflammatory changes. Spleen: Normal in size without focal abnormality. Adrenals/Urinary Tract: The adrenal glands are unremarkable. There is no  hydronephrosis or nephrolithiasis on either side. The visualized ureters and urinary bladder appear unremarkable. Stomach/Bowel: There is loose stool throughout the colon compatible with diarrheal state. Correlation with clinical exam and stool cultures recommended. There is no bowel obstruction or active inflammation. The appendix is not visualized with certainty. No inflammatory changes identified in the right lower quadrant. Vascular/Lymphatic: Mild aortoiliac atherosclerotic disease. The IVC is unremarkable. No portal venous gas. There is no adenopathy. Reproductive: Small partially calcified uterine fibroids. Other: None Musculoskeletal: Lower lumbar facet arthropathy. No acute osseous pathology. IMPRESSION: 1. Diarrheal state. Correlation with clinical exam and stool cultures recommended. No bowel obstruction. 2. Cholelithiasis. 3. Aortic Atherosclerosis (ICD10-I70.0). Electronically Signed   By: Elgie Collard M.D.   On: 02/15/2020 20:06   CT HEAD WO CONTRAST  Result Date: 02/29/2020 CLINICAL DATA:  Fall onto concrete, nausea, vomiting and urinary incontinence EXAM: CT HEAD WITHOUT CONTRAST CT CERVICAL SPINE WITHOUT CONTRAST TECHNIQUE: Multidetector CT imaging of the head and cervical spine was performed following the standard protocol without intravenous contrast. Multiplanar CT image reconstructions of the cervical spine were also generated. COMPARISON:  MRI 09/15/2006 FINDINGS: CT HEAD FINDINGS Brain: No evidence of acute infarction, hemorrhage, hydrocephalus, extra-axial collection or mass lesion/mass effect. Vascular: Atherosclerotic calcification of the carotid siphons. No hyperdense vessel. Skull: No calvarial fracture or suspicious osseous lesion. No scalp swelling or hematoma. Sinuses/Orbits: Paranasal sinuses and mastoid air cells are  predominantly clear. Included orbital structures are unremarkable. Other: Edentulous CT CERVICAL SPINE FINDINGS Alignment: Stabilization collar in place at the  time of examination. Mild leftward neck flexion straightening of the normal cervical lordosis at the upper cervical levels. Mild retrolisthesis of C5 on C6 favored to be on a degenerative basis with 3 mm of posterior translation. No evidence of traumatic listhesis. No abnormally widened, perched or jumped facets. Normal alignment of the craniocervical and atlantoaxial articulations. Skull base and vertebrae: No visible skull base fractures. No vertebral fracture or vertebral body height loss. Moderate arthrosis at the atlantodental interval. Multilevel spondylitic and facet degenerative changes. Normal bone mineralization. No worrisome osseous lesions. Soft tissues and spinal canal: No pre or paravertebral fluid or swelling. No visible canal hematoma. Disc levels: Multilevel intervertebral disc height loss with spondylitic endplate changes. Larger disc osteophyte complexes at C3-4, C4-5, C5-6 result in mild canal stenosis. No severe canal narrowing. Multilevel uncinate spurring and facet hypertrophic changes are present as well resulting in mild-to-moderate foraminal narrowing C4-C7. Upper chest: No acute abnormality in the upper chest or imaged lung apices. Some mild bilateral pleuroparenchymal scarring. Other: Cervical carotid atherosclerosis. No concerning thyroid nodules. IMPRESSION: 1. No acute intracranial abnormality. No calvarial fracture or scalp swelling. 2. No acute fracture or traumatic listhesis of the cervical spine. 3. Multilevel degenerative changes of the cervical spine as described above. 4. Cervical and intracranial atherosclerosis. Electronically Signed   By: Kreg ShropshirePrice  DeHay M.D.   On: 02/29/2020 19:38   CT CERVICAL SPINE WO CONTRAST  Result Date: 02/29/2020 CLINICAL DATA:  Fall onto concrete, nausea, vomiting and urinary incontinence EXAM: CT HEAD WITHOUT CONTRAST CT CERVICAL SPINE WITHOUT CONTRAST TECHNIQUE: Multidetector CT imaging of the head and cervical spine was performed following the  standard protocol without intravenous contrast. Multiplanar CT image reconstructions of the cervical spine were also generated. COMPARISON:  MRI 09/15/2006 FINDINGS: CT HEAD FINDINGS Brain: No evidence of acute infarction, hemorrhage, hydrocephalus, extra-axial collection or mass lesion/mass effect. Vascular: Atherosclerotic calcification of the carotid siphons. No hyperdense vessel. Skull: No calvarial fracture or suspicious osseous lesion. No scalp swelling or hematoma. Sinuses/Orbits: Paranasal sinuses and mastoid air cells are predominantly clear. Included orbital structures are unremarkable. Other: Edentulous CT CERVICAL SPINE FINDINGS Alignment: Stabilization collar in place at the time of examination. Mild leftward neck flexion straightening of the normal cervical lordosis at the upper cervical levels. Mild retrolisthesis of C5 on C6 favored to be on a degenerative basis with 3 mm of posterior translation. No evidence of traumatic listhesis. No abnormally widened, perched or jumped facets. Normal alignment of the craniocervical and atlantoaxial articulations. Skull base and vertebrae: No visible skull base fractures. No vertebral fracture or vertebral body height loss. Moderate arthrosis at the atlantodental interval. Multilevel spondylitic and facet degenerative changes. Normal bone mineralization. No worrisome osseous lesions. Soft tissues and spinal canal: No pre or paravertebral fluid or swelling. No visible canal hematoma. Disc levels: Multilevel intervertebral disc height loss with spondylitic endplate changes. Larger disc osteophyte complexes at C3-4, C4-5, C5-6 result in mild canal stenosis. No severe canal narrowing. Multilevel uncinate spurring and facet hypertrophic changes are present as well resulting in mild-to-moderate foraminal narrowing C4-C7. Upper chest: No acute abnormality in the upper chest or imaged lung apices. Some mild bilateral pleuroparenchymal scarring. Other: Cervical carotid  atherosclerosis. No concerning thyroid nodules. IMPRESSION: 1. No acute intracranial abnormality. No calvarial fracture or scalp swelling. 2. No acute fracture or traumatic listhesis of the cervical spine. 3. Multilevel degenerative changes of the  cervical spine as described above. 4. Cervical and intracranial atherosclerosis. Electronically Signed   By: Kreg Shropshire M.D.   On: 02/29/2020 19:38   MR BRAIN WO CONTRAST  Result Date: 03/01/2020 CLINICAL DATA:  Initial evaluation for acute altered mental status, fall. EXAM: MRI HEAD WITHOUT CONTRAST TECHNIQUE: Multiplanar, multiecho pulse sequences of the brain and surrounding structures were obtained without intravenous contrast. COMPARISON:  Prior head CT from 02/29/2020. FINDINGS: Brain: Examination degraded by motion artifact. Subtle asymmetric atrophy with associated FLAIR signal abnormality seen involving the anterior and mid left temporal lobe (series 11, image 9), of uncertain etiology, but suggesting a chronic process, possibly related to underlying seizure disorder. No diffusion abnormality to suggest acute seizure or status epilepticus. Otherwise, cerebral volume within normal limits. No significant cerebral white matter for age. No evidence for acute or subacute infarct. Gray-white matter differentiation well maintained elsewhere within the brain. No other areas of encephalomalacia to suggest chronic cortical infarction. No evidence for acute or chronic intracranial hemorrhage. No mass lesion, midline shift or mass effect. No hydrocephalus or extra-axial fluid collection. Pituitary gland suprasellar region within normal limits. Midline structures intact. Vascular: Major intracranial vascular flow voids grossly maintained at the skull base. Skull and upper cervical spine: Craniocervical junction within normal limits. Degenerative spondylosis noted within the upper cervical spine without high-grade stenosis. Bone marrow signal intensity within normal  limits. Hyperostosis frontalis interna noted. No scalp soft tissue abnormality. Sinuses/Orbits: Globes and orbital soft tissues grossly within normal limits. Paranasal sinuses are largely clear. No significant mastoid effusion. Inner ear structures grossly normal. Other: None. IMPRESSION: 1. Asymmetric atrophy with associated FLAIR signal abnormality involving the anterior and mid left temporal lobe, of uncertain etiology, but suggesting a chronic process, possibly related to underlying seizure disorder. Correlation with EEG suggested. 2. No other acute intracranial abnormality.  By Electronically Signed   By: Rise Mu M.D.   On: 03/01/2020 04:04   DG Chest Port 1 View  Result Date: 02/15/2020 CLINICAL DATA:  59 year old female with chest pain. EXAM: PORTABLE CHEST 1 VIEW COMPARISON:  Chest radiograph dated 11/03/2018. FINDINGS: The lungs are clear. There is no pleural effusion or pneumothorax. The cardiac silhouette is within limits. No acute osseous pathology. Degenerative changes of the spine. IMPRESSION: No active disease. Electronically Signed   By: Elgie Collard M.D.   On: 02/15/2020 16:36   EEG adult  Result Date: 03/01/2020 Thana Farr, MD     03/01/2020  1:43 PM ELECTROENCEPHALOGRAM REPORT Patient: Tamara [name]       Room #: 3W23C EEG No. ID: [id number] Age: 59 y.o.        Sex: female Requesting Physician: Lowell Guitar Report Date:  03/01/2020       Interpreting Physician: Thana Farr History: Avneet Kreger is an 59 y.o. female with altered mental status Medications: Lipitor, Insulin, Losartan, Dulera, Lactulose Conditions of Recording:  This is a 21 channel routine scalp EEG performed with bipolar and monopolar montages arranged in accordance to the international 10/20 system of electrode placement. One channel was dedicated to EKG recording. The patient is in the awake, drowsy and asleep states. Description:  The waking background activity consists of a low voltage, symmetrical, fairly  well organized, 8 Hz alpha activity, seen from the parieto-occipital and posterior temporal regions.  Low voltage fast activity, poorly organized, is seen anteriorly and is at times superimposed on more posterior regions.  A mixture of theta and alpha rhythms are seen from the central and temporal regions. The patient drowses  with slowing to irregular, low voltage theta and beta activity.  The patient goes in to a light sleep with symmetrical sleep spindles, vertex central sharp transients and irregular slow activity.  No epileptiform activity is noted.  Hyperventilation and intermittent photic stimulation were not performed. IMPRESSION: Normal electroencephalogram, awake, drowsy and sleep. There are no focal lateralizing or epileptiform features. Thana Farr, MD Neurology [phone] 03/01/2020, 1:41 PM   DG ESOPHAGUS W SINGLE CM (SOL OR THIN BA)  Result Date: 03/02/2020 CLINICAL DATA:  Inpatient with encephalopathy, nausea and vomiting. Reported Prilosec at home. Diabetes mellitus. EXAM: ESOPHOGRAM/BARIUM SWALLOW TECHNIQUE: Single contrast examination was performed using  thin barium. FLUOROSCOPY TIME:  Fluoroscopy Time:  2 minutes 0 seconds Radiation Exposure Index (if provided by the fluoroscopic device): 9.7 mGy Number of Acquired Spot Images: 1 COMPARISON:  03/01/2020 CT abdomen/pelvis. FINDINGS: Examination limited by patient's encephalopathy. Examination conducted with the patient supine with elevated head position. Grossly normal oral and pharyngeal phase of swallowing, with no evidence of laryngeal penetration or tracheobronchial aspiration. Mild esophageal dysmotility, characterized by intermittent weakening of primary peristalsis in the mid to lower thoracic esophagus. No hiatal hernia. No gastroesophageal reflux elicited despite provocative maneuvers including water siphon test. No evidence of esophageal mass, stricture or ulcer. Barium tablet traversed the esophagus into the stomach without  significant delay. IMPRESSION: 1. Limited examination, see comments. 2. Mild esophageal dysmotility, suggestive of chronic reflux related dysmotility pattern. 3. No hiatal hernia.  No gastroesophageal reflux elicited. 4. No gross evidence of esophageal mass or stricture. Electronically Signed   By: Delbert Phenix M.D.   On: 03/02/2020 09:17       Subjective: The patient feels fine.  No further seizures.  She denies confusion.  No focal weakness or numbness.  Discharge Exam: Vitals:   03/03/20 2340 03/04/20 0352  BP: 138/78 131/63  Pulse: (!) 58 (!) 55  Resp: 16 16  Temp: 97.9 F (36.6 C) 97.7 F (36.5 C)  SpO2: 100% 100%   Vitals:   03/03/20 0929 03/03/20 1949 03/03/20 2340 03/04/20 0352  BP:  120/81 138/78 131/63  Pulse:  64 (!) 58 (!) 55  Resp:  18 16 16   Temp:  98 F (36.7 C) 97.9 F (36.6 C) 97.7 F (36.5 C)  TempSrc:  Oral Oral Oral  SpO2: 100% 100% 100% 100%  Weight:      Height:        General: Pt is alert, awake, not in acute distress, has mild psychomotor slowing Cardiovascular: RRR, nl S1-S2, no murmurs appreciated.   No LE edema.   Respiratory: Normal respiratory rate and rhythm.  CTAB without rales or wheezes. Abdominal: Abdomen soft and non-tender.  No distension or HSM.   Neuro/Psych: Strength symmetric in upper and lower extremities.  Judgment and insight appear moderately impaired.   The results of significant diagnostics from this hospitalization (including imaging, microbiology, ancillary and laboratory) are listed below for reference.     Microbiology: Recent Results (from the past 240 hour(s))  SARS Coronavirus 2 by RT PCR (hospital order, performed in [facility] hospital lab) Nasopharyngeal Nasopharyngeal Swab     Status: None   Collection Time: 02/29/20 11:12 PM   Specimen: Nasopharyngeal Swab  Result Value Ref Range Status   SARS Coronavirus 2 NEGATIVE NEGATIVE Final    Comment: (NOTE) SARS-CoV-2 target nucleic acids are NOT DETECTED.  The  SARS-CoV-2 RNA is generally detectable in upper and lower respiratory specimens during the acute phase of infection. The lowest concentration of  SARS-CoV-2 viral copies this assay can detect is 250 copies / mL. A negative result does not preclude SARS-CoV-2 infection and should not be used as the sole basis for treatment or other patient management decisions.  A negative result may occur with improper specimen collection / handling, submission of specimen other than nasopharyngeal swab, presence of viral mutation(s) within the areas targeted by this assay, and inadequate number of viral copies (<250 copies / mL). A negative result must be combined with clinical observations, patient history, and epidemiological information.  Fact Sheet for Patients:   [employer]cy  Fact Sheet for Healthcare Providers: https://pope.com/  This test is not yet approved or  cleared by the [place] FDA and has been authorized for detection and/or diagnosis of SARS-CoV-2 by FDA under an Emergency Use Authorization (EUA).  This EUA will remain in effect (meaning this test can be used) for the duration of the COVID-19 declaration under Section 564(b)(1) of the Act, 21 U.S.C. section 360bbb-3(b)(1), unless the authorization is terminated or revoked sooner.  Performed at [facility], [address] [place], [place] [address]      Labs: BNP (last 3 results) No results for input(s): BNP in the last 8760 hours. Basic Metabolic Panel: Recent Labs  Lab 02/29/20 1839 02/29/20 1839 02/29/20 1859 03/01/20 0724 03/02/20 0757 03/03/20 0459 03/04/20 0403  NA 141   < > 149* 148* 147* 144 141  K 4.2   < > 3.5 3.3* 3.3* 3.8 3.2*  CL 111  --   --  116* 115* 112* 108  CO2 18*  --   --  21* 22 24 24   GLUCOSE 167*  --   --  145* 121* 121* 135*  BUN 62*  --   --  48* 27* 18 14  CREATININE 2.12*  --   --  1.59* 1.33* 1.12* 1.18*  CALCIUM 10.1  --    --  9.6 9.4 9.4 9.6  MG 2.5*  --   --   --  1.9 1.8  --   PHOS  --   --   --   --  2.1* 2.5  --    < > = values in this interval not displayed.   Liver Function Tests: Recent Labs  Lab 02/29/20 1839 03/01/20 0724 03/02/20 0757 03/03/20 0459 03/04/20 0403  AST 26 27 20 21 21   ALT 24 29 24 24 23   ALKPHOS 75 73 57 58 64  BILITOT 1.1 0.7 0.8 0.5 0.9  PROT 8.2* 7.8 6.7 6.5 6.5  ALBUMIN 4.3 3.9 3.4* 3.3* 3.3*   No results for input(s): LIPASE, AMYLASE in the last 168 hours. Recent Labs  Lab 02/29/20 1850 03/01/20 0724 03/02/20 0757  AMMONIA 79* 14 12   CBC: Recent Labs  Lab 02/29/20 1839 02/29/20 1839 02/29/20 1859 03/01/20 0724 03/02/20 0757 03/03/20 0459 03/04/20 0403  WBC 12.1*  --   --  11.0* 9.3 8.5 8.2  NEUTROABS 8.7*  --   --   --  6.0 5.2  --   HGB 13.0   < > 12.2 12.5 10.1* 9.9* 9.7*  HCT 40.1   < > 36.0 37.8 30.9* 30.6* 29.1*  MCV 89.5  --   --  89.8 89.3 89.2 88.4  PLT 443*  --   --  437* 337 316 302   < > = values in this interval not displayed.   Cardiac Enzymes: No results for input(s): CKTOTAL, CKMB, CKMBINDEX, TROPONINI in the last 168 hours. BNP:  Invalid input(s): POCBNP CBG: Recent Labs  Lab 03/03/20 1257 03/03/20 1749 03/03/20 2105 03/04/20 0608 03/04/20 0749  GLUCAP 126* 133* 110* 116* 107*   D-Dimer No results for input(s): DDIMER in the last 72 hours. Hgb A1c No results for input(s): HGBA1C in the last 72 hours. Lipid Profile No results for input(s): CHOL, HDL, LDLCALC, TRIG, CHOLHDL, LDLDIRECT in the last 72 hours. Thyroid function studies No results for input(s): TSH, T4TOTAL, T3FREE, THYROIDAB in the last 72 hours.  Invalid input(s): FREET3 Anemia work up No results for input(s): VITAMINB12, FOLATE, FERRITIN, TIBC, IRON, RETICCTPCT in the last 72 hours. Urinalysis    Component Value Date/Time   COLORURINE YELLOW 02/29/2020 2233   APPEARANCEUR CLEAR 02/29/2020 2233   LABSPEC 1.015 02/29/2020 2233   PHURINE 6.0 02/29/2020  2233   GLUCOSEU NEGATIVE 02/29/2020 2233   HGBUR SMALL (A) 02/29/2020 2233   BILIRUBINUR NEGATIVE 02/29/2020 2233   KETONESUR 15 (A) 02/29/2020 2233   PROTEINUR 30 (A) 02/29/2020 2233   UROBILINOGEN 1.0 08/07/2007 0800   NITRITE NEGATIVE 02/29/2020 2233   LEUKOCYTESUR NEGATIVE 02/29/2020 2233   Sepsis Labs Invalid input(s): PROCALCITONIN,  WBC,  LACTICIDVEN Microbiology Recent Results (from the past 240 hour(s))  SARS Coronavirus 2 by RT PCR (hospital order, performed in [facility] hospital lab) Nasopharyngeal Nasopharyngeal Swab     Status: None   Collection Time: 02/29/20 11:12 PM   Specimen: Nasopharyngeal Swab  Result Value Ref Range Status   SARS Coronavirus 2 NEGATIVE NEGATIVE Final    Comment: (NOTE) SARS-CoV-2 target nucleic acids are NOT DETECTED.  The SARS-CoV-2 RNA is generally detectable in upper and lower respiratory specimens during the acute phase of infection. The lowest concentration of SARS-CoV-2 viral copies this assay can detect is 250 copies / mL. A negative result does not preclude SARS-CoV-2 infection and should not be used as the sole basis for treatment or other patient management decisions.  A negative result may occur with improper specimen collection / handling, submission of specimen other than nasopharyngeal swab, presence of viral mutation(s) within the areas targeted by this assay, and inadequate number of viral copies (<250 copies / mL). A negative result must be combined with clinical observations, patient history, and epidemiological information.  Fact Sheet for Patients:   BoilerBrush.com.cy  Fact Sheet for Healthcare Providers: https://pope.com/  This test is not yet approved or  cleared by the [place] FDA and has been authorized for detection and/or diagnosis of SARS-CoV-2 by FDA under an Emergency Use Authorization (EUA).  This EUA will remain in effect (meaning this test can be  used) for the duration of the COVID-19 declaration under Section 564(b)(1) of the Act, 21 U.S.C. section 360bbb-3(b)(1), unless the authorization is terminated or revoked sooner.  Performed at [facility], [address], [place], [place] [address]      Time coordinating discharge: 45 minutes      SIGNED:   Alberteen Sam, MD  [facility] 03/04/2020, 9:17 PM

## 2020-03-04 NOTE — TOC Transition Note (Signed)
Transition of Care [facility]) - CM/SW Discharge Note   Patient Details  Name: Tamara [name] MRN: [id number] Date of Birth: [birth year]  Transition of Care [facility]) CM/SW Contact:  Kermit Balo, RN Phone Number: 03/04/2020, 11:11 AM   Clinical Narrative:    [facility] unable to accept for charity [facility] services d/t their staffing. Pt and MD aware. Pt this am is refusing to go and stay with her sister, [name]. Pt is insisting on discharging home. She states her boyfriend, Alecia Lemming can stay with her. CM asked her to call and Cm was able to speak to Alecia Lemming and he is in agreement with staying with the patient 24 hours a day for several days. [name] in agreement to check on her at home.  TOC delivered medications to the room for d/c.  [name] providing transport home.    Final next level of care: Home/Self Care Barriers to Discharge: Inadequate or no insurance, Barriers Unresolved (comment)   Patient Goals and CMS Choice        Discharge Placement                       Discharge Plan and Services   Discharge Planning Services: CM Consult Post Acute Care Choice: Home Health                    HH Arranged: PT, OT, Speech Therapy HH Agency: [facility] (charity services) Date HH Agency Contacted: 03/03/20   Representative spoke with at [facility] Agency: [employer]  Social Determinants of Health (SDOH) Interventions     Readmission Risk Interventions No flowsheet data found.

## 2020-03-15 ENCOUNTER — Emergency Department (HOSPITAL_COMMUNITY)
Admission: EM | Admit: 2020-03-15 | Discharge: 2020-03-16 | Disposition: A | Discharge status: Home / Self Care | Payer: Self-pay | Attending: Emergency Medicine | Admitting: Emergency Medicine

## 2020-03-15 ENCOUNTER — Other Ambulatory Visit: Payer: Self-pay

## 2020-03-15 ENCOUNTER — Encounter (HOSPITAL_COMMUNITY): Payer: Self-pay | Admitting: Emergency Medicine

## 2020-03-15 DIAGNOSIS — R1030 Lower abdominal pain, unspecified: Secondary | ICD-10-CM | POA: Insufficient documentation

## 2020-03-15 DIAGNOSIS — E119 Type 2 diabetes mellitus without complications: Secondary | ICD-10-CM | POA: Insufficient documentation

## 2020-03-15 DIAGNOSIS — N189 Chronic kidney disease, unspecified: Secondary | ICD-10-CM | POA: Insufficient documentation

## 2020-03-15 DIAGNOSIS — I129 Hypertensive chronic kidney disease with stage 1 through stage 4 chronic kidney disease, or unspecified chronic kidney disease: Secondary | ICD-10-CM | POA: Insufficient documentation

## 2020-03-15 DIAGNOSIS — Z7951 Long term (current) use of inhaled steroids: Secondary | ICD-10-CM | POA: Insufficient documentation

## 2020-03-15 DIAGNOSIS — J45901 Unspecified asthma with (acute) exacerbation: Secondary | ICD-10-CM | POA: Insufficient documentation

## 2020-03-15 DIAGNOSIS — Z79899 Other long term (current) drug therapy: Secondary | ICD-10-CM | POA: Insufficient documentation

## 2020-03-15 DIAGNOSIS — Z7982 Long term (current) use of aspirin: Secondary | ICD-10-CM | POA: Insufficient documentation

## 2020-03-15 DIAGNOSIS — R109 Unspecified abdominal pain: Secondary | ICD-10-CM | POA: Insufficient documentation

## 2020-03-15 DIAGNOSIS — Z7984 Long term (current) use of oral hypoglycemic drugs: Secondary | ICD-10-CM | POA: Insufficient documentation

## 2020-03-15 LAB — CBC
HCT: 34.3 % — ABNORMAL LOW (ref 36.0–46.0)
Hemoglobin: 11.4 g/dL — ABNORMAL LOW (ref 12.0–15.0)
MCH: 29.3 pg (ref 26.0–34.0)
MCHC: 33.2 g/dL (ref 30.0–36.0)
MCV: 88.2 fL (ref 80.0–100.0)
Platelets: 440 10*3/uL — ABNORMAL HIGH (ref 150–400)
RBC: 3.89 MIL/uL (ref 3.87–5.11)
RDW: 12.9 % (ref 11.5–15.5)
WBC: 9.3 10*3/uL (ref 4.0–10.5)
nRBC: 0 % (ref 0.0–0.2)

## 2020-03-15 LAB — COMPREHENSIVE METABOLIC PANEL
ALT: 19 U/L (ref 0–44)
AST: 17 U/L (ref 15–41)
Albumin: 3.4 g/dL — ABNORMAL LOW (ref 3.5–5.0)
Alkaline Phosphatase: 97 U/L (ref 38–126)
Anion gap: 13 (ref 5–15)
BUN: 18 mg/dL (ref 6–20)
CO2: 23 mmol/L (ref 22–32)
Calcium: 9.7 mg/dL (ref 8.9–10.3)
Chloride: 101 mmol/L (ref 98–111)
Creatinine, Ser: 1.35 mg/dL — ABNORMAL HIGH (ref 0.44–1.00)
GFR calc Af Amer: 50 mL/min — ABNORMAL LOW (ref >60)
GFR calc non Af Amer: 43 mL/min — ABNORMAL LOW (ref >60)
Glucose, Bld: 119 mg/dL — ABNORMAL HIGH (ref 70–99)
Potassium: 3 mmol/L — ABNORMAL LOW (ref 3.5–5.1)
Sodium: 137 mmol/L (ref 135–145)
Total Bilirubin: 1.3 mg/dL — ABNORMAL HIGH (ref 0.3–1.2)
Total Protein: 7.5 g/dL (ref 6.5–8.1)

## 2020-03-15 LAB — I-STAT BETA HCG BLOOD, ED (MC, WL, AP ONLY): I-stat hCG, quantitative: <5 m[IU]/mL (ref <5)

## 2020-03-15 LAB — LIPASE, BLOOD: Lipase: 55 U/L — ABNORMAL HIGH (ref 11–51)

## 2020-03-15 MED ORDER — SODIUM CHLORIDE 0.9% FLUSH
3.0000 mL | Freq: Once | INTRAVENOUS | Status: AC
  Administered 2020-03-16: 3 mL via INTRAVENOUS

## 2020-03-15 NOTE — ED Triage Notes (Signed)
Pt brought to ED by [facility] from home for c/o abd pain, nausea and vomiting for a week getting worse, per EMS pt has some orthostatic changes on the way to ED. Lying down BP 130 sitting up SBP 90. 4 mg Zofran  And NS given by EMS pta.

## 2020-03-16 ENCOUNTER — Emergency Department (HOSPITAL_COMMUNITY): Payer: Self-pay

## 2020-03-16 MED ORDER — SODIUM CHLORIDE 0.9 % IV BOLUS
1000.0000 mL | Freq: Once | INTRAVENOUS | Status: AC
  Administered 2020-03-16: 1000 mL via INTRAVENOUS

## 2020-03-16 MED ORDER — IOHEXOL 300 MG/ML  SOLN
80.0000 mL | Freq: Once | INTRAMUSCULAR | Status: AC | PRN
  Administered 2020-03-16: 80 mL via INTRAVENOUS

## 2020-03-16 NOTE — ED Notes (Signed)
Pt refused to complete orthostatic vital signs

## 2020-03-16 NOTE — ED Provider Notes (Signed)
[facility] EMERGENCY DEPARTMENT Provider Note   CSN: [id number] Arrival date & time: 03/15/20  1629     History Chief Complaint  Patient presents with  . Abdominal Pain    Tamara [name] is a 59 y.o. female with past medical history significant for essential hypertension, asthma, diabetes, hyperlipidemia, memory problems.  HPI Patient presents to emergency department today via EMS with chief complaint of progressively worsening abdominal pain x1 week.  States the pain is located in middle of her upper and lower abdomen.  She describes the pain as aching sensation.  She rates the pain 7 of 10 in severity.  Patient states she was out with her sister today to pay a bill when she had sudden onset of nausea and 3 episodes of nonbloody nonbilious emesis.  She called EMS who reports she had orthostatic changes in route.  She was given Zofran and 400 mL normal saline prior to arrival.  She also admits to decreased p.o. intake over the last x1 week secondary to pain. Last bowel movement yesterday and was normal.  Patient is also endorsing itching in her left great toe.  She states it is very bothersome and has been going on for a while, she does not member exactly how long.  She denies any injury to her foot.  She denies any associated numbness, weakness, tingling, decreased sensation in left lower extremity.  Also denies fever, chills, cough, chest pain, shortness of breath, back pain, gross hematuria, dysuria, urinary frequency, diarrhea, blood in stool, pelvic pain, abnormal vaginal bleeding, vaginal discharge.  Denies any drug use or alcohol consumption.    Past Medical History:  Diagnosis Date  . Memory changes     Patient Active Problem List   Diagnosis Date Noted  . Acute encephalopathy 03/01/2020  . Asthma 03/01/2020  . Dehydration 03/01/2020  . Acute kidney injury superimposed on CKD (HCC) 03/01/2020  . Hyperammonemia (HCC) 03/01/2020  . Elevated serum hCG  03/01/2020  . Hypermagnesemia 03/01/2020  . Fall 03/01/2020  . Vomiting 03/01/2020  . Essential hypertension 03/01/2020  . AMS (altered mental status) 03/01/2020  . Chest pain 11/03/2018  . Asthma with acute exacerbation 01/14/2013  . Diabetes (HCC) 01/14/2013  . Dyslipidemia 01/14/2013    History reviewed. No pertinent surgical history.   OB History   No obstetric history on file.     Family History  Problem Relation Age of Onset  . Hypertension Mother   . Hypertension Father     Social History   Tobacco Use  . Smoking status: Never Smoker  . Smokeless tobacco: Never Used  Substance Use Topics  . Alcohol use: Not Currently  . Drug use: Not Currently    Home Medications Prior to Admission medications   Medication Sig Start Date End Date Taking? Authorizing Provider  albuterol (PROVENTIL HFA;VENTOLIN HFA) 108 (90 BASE) MCG/ACT inhaler Inhale 2 puffs into the lungs every 6 (six) hours as needed for wheezing. 10/04/12   Johnson, Clanford L, MD  aspirin EC 81 MG tablet Take 81 mg by mouth daily.    [provider]  atorvastatin (LIPITOR) 80 MG tablet Take 40 mg by mouth at bedtime.     [provider]  Fluticasone-Salmeterol (ADVAIR) 250-50 MCG/DOSE AEPB Inhale 1 puff into the lungs 2 (two) times daily.    [provider]  folic acid (FOLVITE) 1 MG tablet Take 1 tablet (1 mg total) by mouth daily. 03/04/20   Danford, Earl Lites, MD  gabapentin (NEURONTIN) 300  MG capsule Take 1 capsule (300 mg total) by mouth 3 (three) times daily. 03/04/20   Danford, Earl Liteshristopher P, MD  LANTUS SOLOSTAR 100 UNIT/ML Solostar Pen Inject 20 Units into the skin 2 (two) times daily. 01/27/20   [provider]  levETIRAcetam (KEPPRA) 500 MG tablet Take 1 tablet (500 mg total) by mouth 2 (two) times daily. 03/03/20   Danford, Earl Liteshristopher P, MD  loratadine (CLARITIN) 10 MG tablet Take 1 tablet (10 mg total) by mouth daily. 01/14/13   Ghimire, Werner LeanShanker M, MD  losartan  (COZAAR) 100 MG tablet Take 100 mg by mouth daily.    [provider]  magnesium 30 MG tablet Take 1 tablet (30 mg total) by mouth daily. 02/15/20   Terrilee FilesButler, Michael C, MD  metFORMIN (GLUCOPHAGE) 1000 MG tablet Take 1,000 mg by mouth 2 (two) times daily with a meal.    [provider]  MIRALAX 17 GM/SCOOP powder Take 17 g by mouth at bedtime. 01/27/20   [provider]  montelukast (SINGULAIR) 10 MG tablet Take 1 tablet (10 mg total) by mouth at bedtime. 10/04/12   Johnson, Clanford L, MD  NASONEX 50 MCG/ACT nasal spray Place 1 spray into the nose 2 (two) times daily. 01/27/20   [provider]  omeprazole (PRILOSEC) 20 MG capsule Take 20 mg by mouth daily.    [provider]    Allergies    Patient has no known allergies.  Review of Systems   Review of Systems  All other systems are reviewed and are negative for acute change except as noted in the HPI.   Physical Exam Updated Vital Signs BP 115/75   Pulse 62   Temp 97.9 F (36.6 C)   Resp 10   Ht 5\' 7"  (1.702 m)   Wt 69.1 kg   SpO2 100%   BMI 23.86 kg/m   Physical Exam Vitals and nursing note reviewed.  Constitutional:      General: She is not in acute distress.    Appearance: She is not ill-appearing.  HENT:     Head: Normocephalic and atraumatic.     Right Ear: Tympanic membrane and external ear normal.     Left Ear: Tympanic membrane and external ear normal.     Nose: Nose normal.     Mouth/Throat:     Mouth: Mucous membranes are moist.     Pharynx: Oropharynx is clear.  Eyes:     General: No scleral icterus.       Right eye: No discharge.        Left eye: No discharge.     Extraocular Movements: Extraocular movements intact.     Conjunctiva/sclera: Conjunctivae normal.     Pupils: Pupils are equal, round, and reactive to light.  Neck:     Vascular: No JVD.  Cardiovascular:     Rate and Rhythm: Normal rate and regular rhythm.     Pulses: Normal pulses.          Radial  pulses are 2+ on the right side and 2+ on the left side.       Dorsalis pedis pulses are 2+ on the right side and 2+ on the left side.     Heart sounds: Normal heart sounds.  Pulmonary:     Comments: Lungs clear to auscultation in all fields. Symmetric chest rise. No wheezing, rales, or rhonchi. Abdominal:     Tenderness: There is no right CVA tenderness or left CVA tenderness.  Comments: Abdomen is soft, non-distended. Normoactive bowel sounds. Tenderness to palpation of epigastric area. No rigidity, no guarding. No peritoneal signs.  Musculoskeletal:        General: Normal range of motion.     Cervical back: Normal range of motion.  Feet:     Right foot:     Skin integrity: Skin integrity normal.     Toenail Condition: Right toenails are normal.     Left foot:     Skin integrity: Skin integrity normal.     Toenail Condition: Left toenails are normal.     Comments: Equal tactile temperature of bilateral lower extremities. Skin:    General: Skin is warm and dry.     Capillary Refill: Capillary refill takes less than 2 seconds.  Neurological:     Mental Status: She is oriented to person, place, and time.     GCS: GCS eye subscore is 4. GCS verbal subscore is 5. GCS motor subscore is 6.     Comments: Fluent speech, no facial droop.  Psychiatric:        Behavior: Behavior normal.     ED Results / Procedures / Treatments   Labs (all labs ordered are listed, but only abnormal results are displayed) Labs Reviewed  LIPASE, BLOOD - Abnormal; Notable for the following components:      Result Value   Lipase 55 (*)    All other components within normal limits  COMPREHENSIVE METABOLIC PANEL - Abnormal; Notable for the following components:   Potassium 3.0 (*)    Glucose, Bld 119 (*)    Creatinine, Ser 1.35 (*)    Albumin 3.4 (*)    Total Bilirubin 1.3 (*)    GFR calc non Af Amer 43 (*)    GFR calc Af Amer 50 (*)    All other components within normal limits  CBC - Abnormal;  Notable for the following components:   Hemoglobin 11.4 (*)    HCT 34.3 (*)    Platelets 440 (*)    All other components within normal limits  URINALYSIS, ROUTINE W REFLEX MICROSCOPIC  I-STAT BETA HCG BLOOD, ED ([facility], [facility], [facility] ONLY)    EKG None  Radiology CT ABDOMEN PELVIS W CONTRAST  Result Date: 03/16/2020 CLINICAL DATA:  Epigastric pain. EXAM: CT ABDOMEN AND PELVIS WITH CONTRAST TECHNIQUE: Multidetector CT imaging of the abdomen and pelvis was performed using the standard protocol following bolus administration of intravenous contrast. CONTRAST:  22mL OMNIPAQUE IOHEXOL 300 MG/ML  SOLN COMPARISON:  March 01, 2020 FINDINGS: Lower chest: No acute abnormality. Hepatobiliary: No focal liver abnormality is seen. No gallstones, gallbladder wall thickening, or biliary dilatation. Pancreas: Unremarkable. No pancreatic ductal dilatation or surrounding inflammatory changes. Spleen: Normal in size without focal abnormality. Adrenals/Urinary Tract: Adrenal glands are unremarkable. Kidneys are normal, without renal calculi, focal lesion, or hydronephrosis. Bladder is unremarkable. Stomach/Bowel: Stomach is within normal limits. Appendix appears normal. A large amount of barium is seen throughout the large bowel. No evidence of bowel wall thickening, distention, or inflammatory changes. Vascular/Lymphatic: There is mild calcification of the abdominal aorta. No enlarged abdominal or pelvic lymph nodes. Reproductive: Partially calcified and noncalcified heterogeneous uterine fibroids are seen. The largest measures approximately 2.1 cm x 1.6 cm. Other: No abdominal wall hernia or abnormality. No abdominopelvic ascites. Musculoskeletal: No acute or significant osseous findings. IMPRESSION: 1. Large amount of barium throughout the large bowel with associated streak artifact. 2. Partially calcified and noncalcified heterogeneous uterine fibroids. 3. Aortic atherosclerosis. Aortic Atherosclerosis (ICD10-I70.0).  Electronically  Signed   By: Aram Candela M.D.   On: 03/16/2020 03:27   DG Foot Complete Left  Result Date: 03/16/2020 CLINICAL DATA:  First digit pain EXAM: LEFT FOOT - COMPLETE 3+ VIEW COMPARISON:  None. FINDINGS: There is no evidence of fracture or dislocation. There is no evidence of arthropathy or other focal bone abnormality. Soft tissues are unremarkable. IMPRESSION: Negative. Electronically Signed   By: Katherine Mantle M.D.   On: 03/16/2020 01:12    Procedures Procedures (including critical care time)  Medications Ordered in ED Medications  sodium chloride flush (NS) 0.9 % injection 3 mL (3 mLs Intravenous Given 03/16/20 0201)  sodium chloride 0.9 % bolus 1,000 mL (0 mLs Intravenous Stopped 03/16/20 0458)  iohexol (OMNIPAQUE) 300 MG/ML solution 80 mL (80 mLs Intravenous Contrast Given 03/16/20 0318)    ED Course  I have reviewed the triage vital signs and the nursing notes.  Pertinent labs & imaging results that were available during my care of the patient were reviewed by me and considered in my medical decision making (see chart for details).    MDM Rules/Calculators/A&P                          History provided by patient and EMS with additional history obtained from chart review.    Patient presents to the ED with complaints of abdominal pain. Patient nontoxic appearing, in no apparent distress, vitals WNL. On exam patient tender to epigastric area, no peritoneal signs. Will evaluate with labs and CT A/P.  She received in the medics from EMS.  Will give IV fluids.  She declines need for analgesics. She has pruritus of left great toe. Foot exam normal and xray of left foot is negative for any acute traumatic findings.   Labs reviewed and grossly unremarkable. No leukocytosis, hemoglobin better than baseline, no severe electrolyte derangements. LFTs normal. Creatinine slightly elevated however similar to past, IVF given. Lipase elevated at 55. She refuses to give UA, denies any urinary  symptoms so do not feel in and out cath is necessary.  CT A/P shows barium seen in large bowel, no other acute findings.No signs of pancreatitis or SBO. Patient had barium study x 2 weeks ago during last hospital admission.  Discussed CT A/P with radiologist Dr. Mosetta Putt who says it can be common to see barium remaining after a study for up to 2 weeks or longer as it is typical for there to be variability of clearance. He does not see any signs of obstruction.   Patient is tolerating PO intake. Serial abdominal exams are benign. Will discharge home with OTC constipation treatments.  The patient appears reasonably screened and/or stabilized for discharge and I doubt any other medical condition or other [facility] requiring further screening, evaluation, or treatment in the ED at this time prior to discharge. The patient is safe for discharge with strict return precautions discussed. Recommend pcp follow up. Findings and plan of care discussed with supervising physician Dr. Clayborne Dana who agrees with plan of care.   Portions of this note were generated with Scientist, clinical (histocompatibility and immunogenetics). Dictation errors may occur despite best attempts at proofreading.   Final Clinical Impression(s) / ED Diagnoses Final diagnoses:  Abdominal pain, unspecified abdominal location    Rx / DC Orders ED Discharge Orders    None       Kathyrn Lass 03/16/20 0515    Mesner, Barbara Cower, MD 03/16/20 [phone]

## 2020-03-16 NOTE — Discharge Instructions (Signed)
The CT scan shows you are constipated.  If you are still having constipation, please mix 3 capfuls of miralax in a 32 oz gatorade and drink over about an hour. If two hours later and not having a bowel movement please use the enema per directions on the package. If 8 hours goes by and you are still not having bowel movement then repeat. You need to ensure you are drinking plenty of plain water during this time as well. At any time if you have severe abdominal pain, no bowel movement or not passing gas then you need to return to the emergency room.

## 2020-04-02 ENCOUNTER — Emergency Department (HOSPITAL_COMMUNITY): Payer: Self-pay

## 2020-04-02 ENCOUNTER — Other Ambulatory Visit: Payer: Self-pay

## 2020-04-02 ENCOUNTER — Inpatient Hospital Stay (HOSPITAL_COMMUNITY)
Admission: EM | Admit: 2020-04-02 | Discharge: 2020-04-23 | DRG: 640 | Disposition: A | Discharge status: Skilled Nursing Facility | Payer: Self-pay | Attending: Internal Medicine | Admitting: Internal Medicine

## 2020-04-02 ENCOUNTER — Ambulatory Visit (HOSPITAL_COMMUNITY)
Admission: EM | Admit: 2020-04-02 | Discharge: 2020-04-02 | Disposition: A | Discharge status: Other Acute Inpatient Hospital | Payer: Self-pay

## 2020-04-02 DIAGNOSIS — Z8249 Family history of ischemic heart disease and other diseases of the circulatory system: Secondary | ICD-10-CM

## 2020-04-02 DIAGNOSIS — R7881 Bacteremia: Secondary | ICD-10-CM | POA: Diagnosis present

## 2020-04-02 DIAGNOSIS — Z79899 Other long term (current) drug therapy: Secondary | ICD-10-CM

## 2020-04-02 DIAGNOSIS — Z431 Encounter for attention to gastrostomy: Secondary | ICD-10-CM

## 2020-04-02 DIAGNOSIS — Z794 Long term (current) use of insulin: Secondary | ICD-10-CM

## 2020-04-02 DIAGNOSIS — G9340 Encephalopathy, unspecified: Secondary | ICD-10-CM | POA: Diagnosis present

## 2020-04-02 DIAGNOSIS — N1831 Chronic kidney disease, stage 3a: Secondary | ICD-10-CM | POA: Diagnosis present

## 2020-04-02 DIAGNOSIS — E43 Unspecified severe protein-calorie malnutrition: Secondary | ICD-10-CM | POA: Diagnosis present

## 2020-04-02 DIAGNOSIS — J45909 Unspecified asthma, uncomplicated: Secondary | ICD-10-CM | POA: Diagnosis present

## 2020-04-02 DIAGNOSIS — R1310 Dysphagia, unspecified: Secondary | ICD-10-CM | POA: Diagnosis present

## 2020-04-02 DIAGNOSIS — J45901 Unspecified asthma with (acute) exacerbation: Secondary | ICD-10-CM | POA: Diagnosis present

## 2020-04-02 DIAGNOSIS — R4182 Altered mental status, unspecified: Secondary | ICD-10-CM

## 2020-04-02 DIAGNOSIS — E119 Type 2 diabetes mellitus without complications: Secondary | ICD-10-CM

## 2020-04-02 DIAGNOSIS — I129 Hypertensive chronic kidney disease with stage 1 through stage 4 chronic kidney disease, or unspecified chronic kidney disease: Secondary | ICD-10-CM | POA: Diagnosis present

## 2020-04-02 DIAGNOSIS — F0390 Unspecified dementia without behavioral disturbance: Secondary | ICD-10-CM | POA: Diagnosis present

## 2020-04-02 DIAGNOSIS — E1122 Type 2 diabetes mellitus with diabetic chronic kidney disease: Secondary | ICD-10-CM | POA: Diagnosis present

## 2020-04-02 DIAGNOSIS — E870 Hyperosmolality and hypernatremia: Secondary | ICD-10-CM | POA: Diagnosis present

## 2020-04-02 DIAGNOSIS — E878 Other disorders of electrolyte and fluid balance, not elsewhere classified: Secondary | ICD-10-CM | POA: Diagnosis present

## 2020-04-02 DIAGNOSIS — G92 Toxic encephalopathy: Secondary | ICD-10-CM | POA: Diagnosis present

## 2020-04-02 DIAGNOSIS — Z7982 Long term (current) use of aspirin: Secondary | ICD-10-CM

## 2020-04-02 DIAGNOSIS — B957 Other staphylococcus as the cause of diseases classified elsewhere: Secondary | ICD-10-CM | POA: Diagnosis present

## 2020-04-02 DIAGNOSIS — Z681 Body mass index (BMI) 19 or less, adult: Secondary | ICD-10-CM

## 2020-04-02 DIAGNOSIS — R29705 NIHSS score 5: Secondary | ICD-10-CM | POA: Diagnosis not present

## 2020-04-02 DIAGNOSIS — E872 Acidosis: Secondary | ICD-10-CM | POA: Diagnosis present

## 2020-04-02 DIAGNOSIS — R627 Adult failure to thrive: Secondary | ICD-10-CM | POA: Diagnosis present

## 2020-04-02 DIAGNOSIS — E860 Dehydration: Secondary | ICD-10-CM | POA: Diagnosis present

## 2020-04-02 DIAGNOSIS — G40909 Epilepsy, unspecified, not intractable, without status epilepticus: Secondary | ICD-10-CM | POA: Diagnosis present

## 2020-04-02 DIAGNOSIS — R339 Retention of urine, unspecified: Secondary | ICD-10-CM | POA: Diagnosis not present

## 2020-04-02 DIAGNOSIS — G9341 Metabolic encephalopathy: Secondary | ICD-10-CM | POA: Diagnosis present

## 2020-04-02 DIAGNOSIS — I6389 Other cerebral infarction: Secondary | ICD-10-CM | POA: Diagnosis present

## 2020-04-02 DIAGNOSIS — Z20822 Contact with and (suspected) exposure to covid-19: Secondary | ICD-10-CM | POA: Diagnosis present

## 2020-04-02 DIAGNOSIS — E1165 Type 2 diabetes mellitus with hyperglycemia: Secondary | ICD-10-CM | POA: Diagnosis present

## 2020-04-02 DIAGNOSIS — K219 Gastro-esophageal reflux disease without esophagitis: Secondary | ICD-10-CM | POA: Diagnosis present

## 2020-04-02 DIAGNOSIS — E785 Hyperlipidemia, unspecified: Secondary | ICD-10-CM | POA: Diagnosis present

## 2020-04-02 DIAGNOSIS — F1411 Cocaine abuse, in remission: Secondary | ICD-10-CM | POA: Diagnosis present

## 2020-04-02 DIAGNOSIS — E876 Hypokalemia: Secondary | ICD-10-CM | POA: Diagnosis not present

## 2020-04-02 DIAGNOSIS — E512 Wernicke's encephalopathy: Principal | ICD-10-CM | POA: Diagnosis present

## 2020-04-02 DIAGNOSIS — Z9114 Patient's other noncompliance with medication regimen: Secondary | ICD-10-CM

## 2020-04-02 DIAGNOSIS — D649 Anemia, unspecified: Secondary | ICD-10-CM | POA: Diagnosis not present

## 2020-04-02 DIAGNOSIS — N179 Acute kidney failure, unspecified: Secondary | ICD-10-CM

## 2020-04-02 LAB — ETHANOL: Alcohol, Ethyl (B): <10 mg/dL (ref <10)

## 2020-04-02 LAB — URINALYSIS, ROUTINE W REFLEX MICROSCOPIC
Bilirubin Urine: NEGATIVE
Glucose, UA: NEGATIVE mg/dL
Ketones, ur: NEGATIVE mg/dL
Nitrite: NEGATIVE
Protein, ur: NEGATIVE mg/dL
Specific Gravity, Urine: 1.01 (ref 1.005–1.030)
pH: 6 (ref 5.0–8.0)

## 2020-04-02 LAB — CBC WITH DIFFERENTIAL/PLATELET
Abs Immature Granulocytes: 0.06 10*3/uL (ref 0.00–0.07)
Basophils Absolute: 0 10*3/uL (ref 0.0–0.1)
Basophils Relative: 0 %
Eosinophils Absolute: 0 10*3/uL (ref 0.0–0.5)
Eosinophils Relative: 0 %
HCT: 44.6 % (ref 36.0–46.0)
Hemoglobin: 13.9 g/dL (ref 12.0–15.0)
Immature Granulocytes: 0 %
Lymphocytes Relative: 13 %
Lymphs Abs: 2.1 10*3/uL (ref 0.7–4.0)
MCH: 28.5 pg (ref 26.0–34.0)
MCHC: 31.2 g/dL (ref 30.0–36.0)
MCV: 91.4 fL (ref 80.0–100.0)
Monocytes Absolute: 0.8 10*3/uL (ref 0.1–1.0)
Monocytes Relative: 5 %
Neutro Abs: 13 10*3/uL — ABNORMAL HIGH (ref 1.7–7.7)
Neutrophils Relative %: 82 %
Platelets: 445 10*3/uL — ABNORMAL HIGH (ref 150–400)
RBC: 4.88 MIL/uL (ref 3.87–5.11)
RDW: 13.7 % (ref 11.5–15.5)
WBC: 15.9 10*3/uL — ABNORMAL HIGH (ref 4.0–10.5)
nRBC: 0 % (ref 0.0–0.2)

## 2020-04-02 LAB — COMPREHENSIVE METABOLIC PANEL
ALT: 25 U/L (ref 0–44)
AST: 24 U/L (ref 15–41)
Albumin: 4.3 g/dL (ref 3.5–5.0)
Alkaline Phosphatase: 76 U/L (ref 38–126)
Anion gap: 24 — ABNORMAL HIGH (ref 5–15)
BUN: 141 mg/dL — ABNORMAL HIGH (ref 6–20)
CO2: 18 mmol/L — ABNORMAL LOW (ref 22–32)
Calcium: 10.7 mg/dL — ABNORMAL HIGH (ref 8.9–10.3)
Chloride: 111 mmol/L (ref 98–111)
Creatinine, Ser: 4.88 mg/dL — ABNORMAL HIGH (ref 0.44–1.00)
GFR calc Af Amer: 11 mL/min — ABNORMAL LOW (ref >60)
GFR calc non Af Amer: 9 mL/min — ABNORMAL LOW (ref >60)
Glucose, Bld: 293 mg/dL — ABNORMAL HIGH (ref 70–99)
Potassium: 3.4 mmol/L — ABNORMAL LOW (ref 3.5–5.1)
Sodium: 153 mmol/L — ABNORMAL HIGH (ref 135–145)
Total Bilirubin: 1 mg/dL (ref 0.3–1.2)
Total Protein: 8.5 g/dL — ABNORMAL HIGH (ref 6.5–8.1)

## 2020-04-02 LAB — RAPID URINE DRUG SCREEN, HOSP PERFORMED
Amphetamines: NOT DETECTED
Barbiturates: NOT DETECTED
Benzodiazepines: NOT DETECTED
Cocaine: NOT DETECTED
Opiates: NOT DETECTED
Tetrahydrocannabinol: NOT DETECTED

## 2020-04-02 LAB — LACTIC ACID, PLASMA
Lactic Acid, Venous: 4.5 mmol/L (ref 0.5–1.9)
Lactic Acid, Venous: 3 mmol/L (ref 0.5–1.9)

## 2020-04-02 LAB — AMMONIA: Ammonia: 24 umol/L (ref 9–35)

## 2020-04-02 LAB — CBG MONITORING, ED
Glucose-Capillary: 253 mg/dL — ABNORMAL HIGH (ref 70–99)
Glucose-Capillary: 180 mg/dL — ABNORMAL HIGH (ref 70–99)

## 2020-04-02 LAB — SALICYLATE LEVEL: Salicylate Lvl: <7 mg/dL — ABNORMAL LOW (ref 7.0–30.0)

## 2020-04-02 LAB — SARS CORONAVIRUS 2 BY RT PCR (HOSPITAL ORDER, PERFORMED IN ~~LOC~~ HOSPITAL LAB): SARS Coronavirus 2: NEGATIVE

## 2020-04-02 LAB — ACETAMINOPHEN LEVEL: Acetaminophen (Tylenol), Serum: <10 ug/mL — ABNORMAL LOW (ref 10–30)

## 2020-04-02 MED ORDER — INSULIN GLARGINE 100 UNIT/ML ~~LOC~~ SOLN
20.0000 [IU] | Freq: Two times a day (BID) | SUBCUTANEOUS | Status: DC
  Administered 2020-04-02: 20 [IU] via SUBCUTANEOUS
  Filled 2020-04-02 (×3): qty 0.2

## 2020-04-02 MED ORDER — SODIUM CHLORIDE 0.9 % IV SOLN
1.0000 g | INTRAVENOUS | Status: AC
  Administered 2020-04-02 – 2020-04-06 (×5): 1 g via INTRAVENOUS
  Filled 2020-04-02 (×5): qty 10

## 2020-04-02 MED ORDER — ALBUTEROL SULFATE (2.5 MG/3ML) 0.083% IN NEBU: 2.5000 mg | INHALATION_SOLUTION | Freq: Four times a day (QID) | RESPIRATORY_TRACT | Status: DC | PRN

## 2020-04-02 MED ORDER — SODIUM CHLORIDE 0.9 % IV SOLN: INTRAVENOUS | Status: DC

## 2020-04-02 MED ORDER — LORATADINE 10 MG PO TABS
10.0000 mg | ORAL_TABLET | Freq: Every day | ORAL | Status: DC
  Filled 2020-04-02 (×2): qty 1

## 2020-04-02 MED ORDER — SODIUM CHLORIDE 0.9 % IV BOLUS
1000.0000 mL | Freq: Once | INTRAVENOUS | Status: AC
  Administered 2020-04-02: 1000 mL via INTRAVENOUS

## 2020-04-02 MED ORDER — POLYETHYLENE GLYCOL 3350 17 G PO PACK
17.0000 g | PACK | Freq: Every day | ORAL | Status: DC
  Filled 2020-04-02 (×5): qty 1

## 2020-04-02 MED ORDER — HEPARIN SODIUM (PORCINE) 5000 UNIT/ML IJ SOLN
5000.0000 [IU] | Freq: Three times a day (TID) | INTRAMUSCULAR | Status: DC
  Administered 2020-04-02 – 2020-04-04 (×5): 5000 [IU] via SUBCUTANEOUS
  Filled 2020-04-02 (×6): qty 1

## 2020-04-02 MED ORDER — ALBUTEROL SULFATE HFA 108 (90 BASE) MCG/ACT IN AERS
2.0000 | INHALATION_SPRAY | Freq: Four times a day (QID) | RESPIRATORY_TRACT | Status: DC | PRN
  Filled 2020-04-02: qty 6.7

## 2020-04-02 MED ORDER — MONTELUKAST SODIUM 10 MG PO TABS
10.0000 mg | ORAL_TABLET | Freq: Every day | ORAL | Status: DC
  Filled 2020-04-02 (×5): qty 1

## 2020-04-02 MED ORDER — MOMETASONE FURO-FORMOTEROL FUM 200-5 MCG/ACT IN AERO
2.0000 | INHALATION_SPRAY | Freq: Two times a day (BID) | RESPIRATORY_TRACT | Status: DC
  Administered 2020-04-05 – 2020-04-20 (×17): 2 via RESPIRATORY_TRACT
  Filled 2020-04-02 (×3): qty 8.8

## 2020-04-02 MED ORDER — FOLIC ACID 1 MG PO TABS
1.0000 mg | ORAL_TABLET | Freq: Every day | ORAL | Status: DC
  Filled 2020-04-02: qty 1

## 2020-04-02 MED ORDER — DEXTROSE-NACL 5-0.45 % IV SOLN: INTRAVENOUS | Status: DC

## 2020-04-02 MED ORDER — INSULIN ASPART 100 UNIT/ML ~~LOC~~ SOLN
0.0000 [IU] | Freq: Three times a day (TID) | SUBCUTANEOUS | Status: DC
  Administered 2020-04-03: 8 [IU] via SUBCUTANEOUS
  Administered 2020-04-03: 3 [IU] via SUBCUTANEOUS
  Administered 2020-04-04: 2 [IU] via SUBCUTANEOUS
  Administered 2020-04-04: 3 [IU] via SUBCUTANEOUS
  Administered 2020-04-04 – 2020-04-07 (×5): 2 [IU] via SUBCUTANEOUS

## 2020-04-02 NOTE — ED Notes (Signed)
Admitting provider bedside 

## 2020-04-02 NOTE — ED Notes (Signed)
Attempted to give report 

## 2020-04-02 NOTE — ED Triage Notes (Signed)
Pt BIB [facility] for [facility]. They were called to [facility], family had taken patient there and she was responsive to voice, not following commands. Per family pt took herself off of meds three weeks ago, including insulin, EMS CBG 290. Per EMS family believes that this is a behavioral/bipolar episode. On arrival patient is responsive to voice and will give name after sternal rub.

## 2020-04-02 NOTE — ED Notes (Signed)
Attempted to call report

## 2020-04-02 NOTE — ED Notes (Signed)
Taken to CT.

## 2020-04-02 NOTE — ED Provider Notes (Signed)
[facility] EMERGENCY DEPARTMENT Provider Note   CSN: [id number] Arrival date & time: 04/02/20  1506     History Chief Complaint  Patient presents with  . Altered Mental Status    Tamara [name] is a 59 y.o. female presenting for evaluation of altered mental status.  Level five caveat due to AMS.  History obtained from EMS/triage note.  Patient was brought by family to behavioral health urgent care due to altered mental status.  Sent to the ER from behavioral health due to AMS.  Per family, patient stopped taking all of her medicines in the past 3 weeks.  We do not know how long she has been altered for.  No other reported symptoms.  Additional history obtained from chart review.  Patient with a history of diabetes, previous AMS/encephalopathy, AKI/CKD, hyperammonemia, asthma, seizures.  Patient was seen in the ED 2 weeks ago, at that point there was no mention of AMS or medication noncompliance. Pt had MRI 1 month ago showing slight abnormality, consider seizure/chronic issues.   HPI     Past Medical History:  Diagnosis Date  . Memory changes     Patient Active Problem List   Diagnosis Date Noted  . AKI (acute kidney injury) (HCC) 04/02/2020  . Acute metabolic encephalopathy 04/02/2020  . Acute encephalopathy 03/01/2020  . Asthma 03/01/2020  . Dehydration 03/01/2020  . Acute kidney injury superimposed on CKD (HCC) 03/01/2020  . Hyperammonemia (HCC) 03/01/2020  . Elevated serum hCG 03/01/2020  . Hypermagnesemia 03/01/2020  . Fall 03/01/2020  . Vomiting 03/01/2020  . Essential hypertension 03/01/2020  . AMS (altered mental status) 03/01/2020  . Chest pain 11/03/2018  . Asthma with acute exacerbation 01/14/2013  . Diabetes (HCC) 01/14/2013  . Dyslipidemia 01/14/2013    No past surgical history on file.   OB History   No obstetric history on file.     Family History  Problem Relation Age of Onset  . Hypertension Mother   . Hypertension Father      Social History   Tobacco Use  . Smoking status: Never Smoker  . Smokeless tobacco: Never Used  Substance Use Topics  . Alcohol use: Not Currently  . Drug use: Not Currently    Home Medications Prior to Admission medications   Medication Sig Start Date End Date Taking? Authorizing Provider  Acetaminophen 500 MG capsule Take 1,000 mg by mouth 3 (three) times daily as needed for pain. 01/27/20   [provider]  albuterol (PROVENTIL HFA;VENTOLIN HFA) 108 (90 BASE) MCG/ACT inhaler Inhale 2 puffs into the lungs every 6 (six) hours as needed for wheezing. 10/04/12   Johnson, Clanford L, MD  aspirin EC 81 MG tablet Take 81 mg by mouth daily.    [provider]  atorvastatin (LIPITOR) 80 MG tablet Take 40 mg by mouth at bedtime.     [provider]  Fluticasone-Salmeterol (ADVAIR) 250-50 MCG/DOSE AEPB Inhale 1 puff into the lungs 2 (two) times daily.    [provider]  folic acid (FOLVITE) 1 MG tablet Take 1 tablet (1 mg total) by mouth daily. 03/04/20   Danford, Earl Lites, MD  gabapentin (NEURONTIN) 300 MG capsule Take 1 capsule (300 mg total) by mouth 3 (three) times daily. 03/04/20   Danford, Earl Lites, MD  LANTUS SOLOSTAR 100 UNIT/ML Solostar Pen Inject 20 Units into the skin 2 (two) times daily. 01/27/20   [provider]  levETIRAcetam (KEPPRA) 500 MG tablet Take 1 tablet (500 mg total) by  mouth 2 (two) times daily. 03/03/20   Danford, Earl Lites, MD  loratadine (CLARITIN) 10 MG tablet Take 1 tablet (10 mg total) by mouth daily. 01/14/13   Ghimire, Werner Lean, MD  losartan (COZAAR) 100 MG tablet Take 100 mg by mouth daily.    [provider]  magnesium 30 MG tablet Take 1 tablet (30 mg total) by mouth daily. 02/15/20   Terrilee Files, MD  metFORMIN (GLUCOPHAGE) 1000 MG tablet Take 1,000 mg by mouth 2 (two) times daily with a meal.    [provider]  MIRALAX 17 GM/SCOOP powder Take 17 g by mouth at bedtime. 01/27/20    [provider]  montelukast (SINGULAIR) 10 MG tablet Take 1 tablet (10 mg total) by mouth at bedtime. 10/04/12   Johnson, Clanford L, MD  NASONEX 50 MCG/ACT nasal spray Place 1 spray into the nose 2 (two) times daily. 01/27/20   [provider]  omeprazole (PRILOSEC) 20 MG capsule Take 20 mg by mouth daily.    [provider]    Allergies    Patient has no known allergies.  Review of Systems   Review of Systems  Unable to perform ROS: Mental status change    Physical Exam Updated Vital Signs BP (!) 122/106   Pulse 91   Temp (!) 97.4 F (36.3 C)   Resp 13   SpO2 91%   Physical Exam Vitals and nursing note reviewed.  Constitutional:      Appearance: She is well-developed.     Comments: Appears unkempt  HENT:     Head: Normocephalic and atraumatic.     Mouth/Throat:     Mouth: Mucous membranes are dry.     Comments: MM dry Eyes:     General: No scleral icterus.    Conjunctiva/sclera: Conjunctivae normal.     Pupils: Pupils are equal, round, and reactive to light.     Comments: PERRLA. Pt not cooperating with EOMs (will squeeze eyes shut during attempted evaluation)  Cardiovascular:     Rate and Rhythm: Regular rhythm. Tachycardia present.     Pulses: Normal pulses.     Comments: Tachycardic around 115 Pulmonary:     Effort: Pulmonary effort is normal. No respiratory distress.     Breath sounds: Normal breath sounds. No wheezing.     Comments: Clear lung sounds Abdominal:     General: There is no distension.     Palpations: Abdomen is soft. There is no mass.     Tenderness: There is abdominal tenderness. There is no guarding or rebound.     Comments: ttp of the upper abd. No rigidity, guarding, distention. Negative rebound  Musculoskeletal:        General: Normal range of motion.     Cervical back: Normal range of motion and neck supple.  Skin:    General: Skin is warm and dry.     Capillary Refill: Capillary refill takes less than 2  seconds.     Comments: Skin appears dry  Neurological:     GCS: GCS eye subscore is 3. GCS verbal subscore is 1. GCS motor subscore is 6.     Comments: Initial GCS of 10.  Remaining neuro exam limited due to AMS     ED Results / Procedures / Treatments   Labs (all labs ordered are listed, but only abnormal results are displayed) Labs Reviewed  CBC WITH DIFFERENTIAL/PLATELET - Abnormal; Notable for the following components:      Result Value  WBC 15.9 (*)    Platelets 445 (*)    Neutro Abs 13.0 (*)    All other components within normal limits  COMPREHENSIVE METABOLIC PANEL - Abnormal; Notable for the following components:   Sodium 153 (*)    Potassium 3.4 (*)    CO2 18 (*)    Glucose, Bld 293 (*)    BUN 141 (*)    Creatinine, Ser 4.88 (*)    Calcium 10.7 (*)    Total Protein 8.5 (*)    GFR calc non Af Amer 9 (*)    GFR calc Af Amer 11 (*)    Anion gap 24 (*)    All other components within normal limits  LACTIC ACID, PLASMA - Abnormal; Notable for the following components:   Lactic Acid, Venous 4.5 (*)    All other components within normal limits  LACTIC ACID, PLASMA - Abnormal; Notable for the following components:   Lactic Acid, Venous 3.0 (*)    All other components within normal limits  ACETAMINOPHEN LEVEL - Abnormal; Notable for the following components:   Acetaminophen (Tylenol), Serum <10 (*)    All other components within normal limits  SALICYLATE LEVEL - Abnormal; Notable for the following components:   Salicylate Lvl <7.0 (*)    All other components within normal limits  URINALYSIS, ROUTINE W REFLEX MICROSCOPIC - Abnormal; Notable for the following components:   APPearance HAZY (*)    Hgb urine dipstick MODERATE (*)    Leukocytes,Ua SMALL (*)    Bacteria, UA MANY (*)    All other components within normal limits  CBG MONITORING, ED - Abnormal; Notable for the following components:   Glucose-Capillary 253 (*)    All other components within normal limits   SARS CORONAVIRUS 2 BY RT PCR (HOSPITAL ORDER, PERFORMED IN [facility])  CULTURE, BLOOD (ROUTINE X 2)  CULTURE, BLOOD (ROUTINE X 2)  URINE CULTURE  AMMONIA  ETHANOL  RAPID URINE DRUG SCREEN, HOSP PERFORMED  LEVETIRACETAM LEVEL  BLOOD GAS, VENOUS  CBC  COMPREHENSIVE METABOLIC PANEL  PROTIME-INR  CBC  TSH  VITAMIN B12  BASIC METABOLIC PANEL    EKG EKG Interpretation  Date/Time:  Friday April 02 2020 15:10:27 EDT Ventricular Rate:  112 PR Interval:    QRS Duration: 84 QT Interval:  358 QTC Calculation: 489 R Axis:   -94 Text Interpretation: Sinus tachycardia Abnormal R-wave progression, late transition Inferior infarct, old Confirmed by Raeford Razor [phone]) on 04/02/2020 6:44:43 PM   Radiology CT ABDOMEN PELVIS WO CONTRAST  Result Date: 04/02/2020 CLINICAL DATA:  Acute abdominal pain.  Decreased responsiveness. EXAM: CT ABDOMEN AND PELVIS WITHOUT CONTRAST TECHNIQUE: Multidetector CT imaging of the abdomen and pelvis was performed following the standard protocol without IV contrast. COMPARISON:  03/16/2020.  03/01/2020 FINDINGS: Lower chest: The lung bases are clear. Hepatobiliary: The gallbladder is moderately distended. No stones or wall thickening. No bile duct dilatation. Unenhanced appearance of the liver is unremarkable. Pancreas: Unremarkable. No pancreatic ductal dilatation or surrounding inflammatory changes. Spleen: Normal in size without focal abnormality. Adrenals/Urinary Tract: Adrenal glands are unremarkable. Kidneys are normal, without renal calculi, focal lesion, or hydronephrosis. Bladder is unremarkable. Stomach/Bowel: Stomach, small bowel, and colon are not abnormally distended. No wall thickening or inflammatory changes are demonstrated. There is persistent finding of residual contrast material demonstrated throughout the colon and rectum. Streak artifact arising from contrast material limits examination. The appendix is normal. Vascular/Lymphatic:  Aortic atherosclerosis. No enlarged abdominal or pelvic lymph nodes. Reproductive:  Uterus is normal in size. Scattered uterine calcifications consistent with fibroids. No abnormal adnexal masses. Other: No abdominal wall hernia or abnormality. No abdominopelvic ascites. Musculoskeletal: No acute or significant osseous findings. IMPRESSION: 1. No acute process demonstrated in the abdomen or pelvis on noncontrast imaging. No evidence of bowel obstruction or inflammation. Residual contrast material demonstrated throughout the colon. 2. Aortic atherosclerosis. 3. Uterine fibroids. Aortic Atherosclerosis (ICD10-I70.0). Electronically Signed   By: Burman Nieves M.D.   On: 04/02/2020 19:13   CT Head Wo Contrast  Result Date: 04/02/2020 CLINICAL DATA:  Decreased responsiveness. Mental status change of unknown cause. EXAM: CT HEAD WITHOUT CONTRAST TECHNIQUE: Contiguous axial images were obtained from the base of the skull through the vertex without intravenous contrast. COMPARISON:  CT 02/29/2020.  MRI 03/01/2020 FINDINGS: Brain: Diffuse cerebral atrophy. Ventricular dilatation consistent with central atrophy. Low-attenuation changes in the deep white matter consistent with small vessel ischemia. No abnormal extra-axial fluid collections. No mass effect or midline shift. Gray-white matter junctions are distinct. Basal cisterns are not effaced. No acute intracranial hemorrhage. Vascular: Intracranial arterial calcifications. Skull: Calvarium appears intact. Sinuses/Orbits: Paranasal sinuses and mastoid air cells are clear. Other: None. IMPRESSION: 1. No acute intracranial abnormalities. 2. Chronic atrophy and small vessel ischemia. Electronically Signed   By: Burman Nieves M.D.   On: 04/02/2020 19:16   DG Chest Portable 1 View  Result Date: 04/02/2020 CLINICAL DATA:  Tachycardia EXAM: PORTABLE CHEST 1 VIEW COMPARISON:  02/15/2020 FINDINGS: The heart size and mediastinal contours are within normal limits. Both  lungs are clear. The visualized skeletal structures are unremarkable. Residual contrast material in the visualized colon. IMPRESSION: No active disease. Electronically Signed   By: Burman Nieves M.D.   On: 04/02/2020 20:07    Procedures .Critical Care Performed by: Alveria Apley, PA-C Authorized by: Alveria Apley, PA-C   Critical care provider statement:    Critical care time (minutes):  50   Critical care time was exclusive of:  Separately billable procedures and treating other patients and teaching time   Critical care was necessary to treat or prevent imminent or life-threatening deterioration of the following conditions:  Renal failure and dehydration   Critical care was time spent personally by me on the following activities:  Blood draw for specimens, development of treatment plan with patient or surrogate, discussions with primary provider, evaluation of patient's response to treatment, examination of patient, obtaining history from patient or surrogate, ordering and performing treatments and interventions, ordering and review of laboratory studies, ordering and review of radiographic studies, pulse oximetry, re-evaluation of patient's condition and review of old charts   I assumed direction of critical care for this patient from another provider in my specialty: no   Comments:     Pt with severe dehydration. Requiring multiple fluid boluses and admission.    (including critical care time)  Medications Ordered in ED Medications  insulin glargine (LANTUS) injection 20 Units (has no administration in time range)  polyethylene glycol (MIRALAX / GLYCOLAX) packet 17 g (has no administration in time range)  folic acid (FOLVITE) tablet 1 mg (has no administration in time range)  mometasone-formoterol (DULERA) 200-5 MCG/ACT inhaler 2 puff (has no administration in time range)  montelukast (SINGULAIR) tablet 10 mg (has no administration in time range)  loratadine (CLARITIN) tablet  10 mg (has no administration in time range)  albuterol (VENTOLIN HFA) 108 (90 Base) MCG/ACT inhaler 2 puff (has no administration in time range)  cefTRIAXone (ROCEPHIN) 1 g in sodium chloride 0.9 %  100 mL IVPB (has no administration in time range)  heparin injection 5,000 Units (has no administration in time range)  insulin aspart (novoLOG) injection 0-15 Units (has no administration in time range)  dextrose 5 %-0.45 % sodium chloride infusion (has no administration in time range)  sodium chloride 0.9 % bolus 1,000 mL (0 mLs Intravenous Stopped 04/02/20 1630)  sodium chloride 0.9 % bolus 1,000 mL (0 mLs Intravenous Stopped 04/02/20 1800)  sodium chloride 0.9 % bolus 1,000 mL (0 mLs Intravenous Stopped 04/02/20 1918)    ED Course  I have reviewed the triage vital signs and the nursing notes.  Pertinent labs & imaging results that were available during my care of the patient were reviewed by me and considered in my medical decision making (see chart for details).    MDM Rules/Calculators/A&P                          Patient presenting for evaluation of altered mental status.  On exam, patient is tachycardic, appears dehydrated, blood pressure is soft.  Concern for dehydration.  She has a history of AKI's, consider uremia.  She also has a history of hyperammonemia, consider hepatic encephalopathy.  Questionable history of seizures, it appears patient is on Keppra.  Consider seizure disorder.  Consider head injury, as we have no apparent history.  Less likely infection, as patient is afebrile and without obvious infectious symptoms, however this remains in the differential.  Consider psychiatric causes.  Patient is on gabapentin, this could be an overdose of this or any of her other medications.  Additionally, patient has a history of diabetes and reportedly is not taking her medicine, consider DKA.  Will obtain labs, CT head, EKG.  Will give fluids and reassess.  I attempted to call pt's sister x2 for  further history, no contact made.   Patient is temperature low at 96.9 rectally.  Will apply warm blankets and recheck.  Patient had a documented blood pressure in the 70s, however after 200 cc of fluid, this is back to the 90s systolic.  Labs interpreted by me, mild leukocytosis of 16. CMp c/w severe dehydration, elevated SCr at 4.88 and BUN of 151. blg mildly elevated with abnormal co2 and gap, but doubt dka. discussed with attending, Dr. Juleen China agrees to plan. Will aggressively fluid resuscitate and obtain ct noncon. lacticl elevated at 4.5. The patient is noted to have a lactate>4. With the current information available to me, I don't think the patient is in septic shock. The lactate>4, is related to OTHER SHOCK dehydration and AKI.  Repeat lactic improved to 3. Repeat temp reassuring at 97.4.  Patient sister, Enid Cutter, arrived to the ER.  Gave further history that patient has not been taking her medicine or eating regularly for the past several weeks.  She has had a gradual decline in mental status over several weeks. As such, lower suspicion for sepsis as cause.  This is consistent with severe dehydration/FTT.   CT head negative for acute findings.  CT abdomen pelvis without acute findings of his bowel obstruction or infection.  Patient does have residual barium, which is from a study approximately 1 month ago.  Will call for admission for severe dehydration and AKI.  Discussed with Dr. Ashok Pall, pt to be admitted.   Final Clinical Impression(s) / ED Diagnoses Final diagnoses:  Altered mental status, unspecified altered mental status type  AKI (acute kidney injury) (HCC)  Dehydration    Rx /  DC Orders ED Discharge Orders    None       Alveria ApleyCaccavale, Sophonie Goforth, PA-C 04/02/20 2102    Raeford RazorKohut, Stephen, MD 04/02/20 2304
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
## 2020-04-02 NOTE — H&P (Addendum)
History and Physical    Tamara [name] TML:[id number] DOB: 12/12/1960 DOA: 04/02/2020  PCP: [name], NP  Patient coming from: home   Chief Complaint: altered mental status  HPI: Tamara [name] is a 59 y.o. female with medical history significant for dm, htn, ckd, asthma, recent hospitalization for unresponsiveness/AMS where diagnosed w/ seizure disorder and started on anticonvulsant, presenting with above.  History obtained from patient's sister as pt not answering questions.  Sister reports several weeks of worsening mental status. Frequently not making sense, confused. Few days of not moving much. Has not been taking meds and rarely eating/drinking. No witnessed falls or seizure like activity. No reported fevers. Sister does say pt has had intermittent nbnb emesis. No diarrhea. No complaints of dysuria. No report of toxic habits or unintentional overdose.  ED Course: Labs, imaging, 3 L NS  Review of Systems: As per HPI otherwise 10 point review of systems negative.    Past Medical History:  Diagnosis Date  . Memory changes     No past surgical history on file.   reports that she has never smoked. She has never used smokeless tobacco. She reports previous alcohol use. She reports previous drug use.  No Known Allergies  Family History  Problem Relation Age of Onset  . Hypertension Mother   . Hypertension Father     Prior to Admission medications   Medication Sig Start Date End Date Taking? Authorizing Provider  Acetaminophen 500 MG capsule Take 1,000 mg by mouth 3 (three) times daily as needed for pain. 01/27/20   [provider]  albuterol (PROVENTIL HFA;VENTOLIN HFA) 108 (90 BASE) MCG/ACT inhaler Inhale 2 puffs into the lungs every 6 (six) hours as needed for wheezing. 10/04/12   Johnson, Clanford L, MD  aspirin EC 81 MG tablet Take 81 mg by mouth daily.    [provider]  atorvastatin (LIPITOR) 80 MG tablet Take 40 mg by mouth at bedtime.      [provider]  Fluticasone-Salmeterol (ADVAIR) 250-50 MCG/DOSE AEPB Inhale 1 puff into the lungs 2 (two) times daily.    [provider]  folic acid (FOLVITE) 1 MG tablet Take 1 tablet (1 mg total) by mouth daily. 03/04/20   Danford, Earl Lites, MD  gabapentin (NEURONTIN) 300 MG capsule Take 1 capsule (300 mg total) by mouth 3 (three) times daily. 03/04/20   Danford, Earl Lites, MD  LANTUS SOLOSTAR 100 UNIT/ML Solostar Pen Inject 20 Units into the skin 2 (two) times daily. 01/27/20   [provider]  levETIRAcetam (KEPPRA) 500 MG tablet Take 1 tablet (500 mg total) by mouth 2 (two) times daily. 03/03/20   Danford, Earl Lites, MD  loratadine (CLARITIN) 10 MG tablet Take 1 tablet (10 mg total) by mouth daily. 01/14/13   Ghimire, Werner Lean, MD  losartan (COZAAR) 100 MG tablet Take 100 mg by mouth daily.    [provider]  magnesium 30 MG tablet Take 1 tablet (30 mg total) by mouth daily. 02/15/20   Terrilee Files, MD  metFORMIN (GLUCOPHAGE) 1000 MG tablet Take 1,000 mg by mouth 2 (two) times daily with a meal.    [provider]  MIRALAX 17 GM/SCOOP powder Take 17 g by mouth at bedtime. 01/27/20   [provider]  montelukast (SINGULAIR) 10 MG tablet Take 1 tablet (10 mg total) by mouth at bedtime. 10/04/12   Johnson, Clanford L, MD  NASONEX 50 MCG/ACT nasal spray Place 1 spray into the nose 2 (two) times  daily. 01/27/20   [provider]  omeprazole (PRILOSEC) 20 MG capsule Take 20 mg by mouth daily.    [provider]    Physical Exam: Vitals:   04/02/20 1831 04/02/20 1833 04/02/20 1911 04/02/20 1920  BP: 132/89   (!) 122/106  Pulse:  72  91  Resp: 16 13  13   Temp:   (!) 97.4 F (36.3 C)   TempSrc:      SpO2:  100%  91%    Constitutional: No acute distress Head: Atraumatic Eyes: Conjunctiva clear ENM: dry mucous membranes. poordentition.  Neck: Supple Respiratory: Clear to auscultation bilaterally, no  wheezing/rales/rhonchi. Normal respiratory effort. No accessory muscle use. . Cardiovascular: Regular rate and rhythm. No murmurs/rubs/gallops. Abdomen: Non-tender, non-distended. No masses. No rebound or guarding. Positive bowel sounds. Musculoskeletal: No joint deformity upper and lower extremities. Normal ROM, no contractures. Normal muscle tone.  Skin: No rashes, lesions, or ulcers.  Extremities: No peripheral edema. Palpable peripheral pulses. Neurologic: Alert, moving all 4 extremities. Psychiatric: alert, speaking but not making sense, makes eye contact but doesn't answer questions, able to follow some simple commands   Labs on Admission: I have personally reviewed following labs and imaging studies  CBC: Recent Labs  Lab 04/02/20 1514  WBC 15.9*  NEUTROABS 13.0*  HGB 13.9  HCT 44.6  MCV 91.4  PLT 445*   Basic Metabolic Panel: Recent Labs  Lab 04/02/20 1514  NA 153*  K 3.4*  CL 111  CO2 18*  GLUCOSE 293*  BUN 141*  CREATININE 4.88*  CALCIUM 10.7*   GFR: CrCl cannot be calculated (Unknown ideal weight.). Liver Function Tests: Recent Labs  Lab 04/02/20 1514  AST 24  ALT 25  ALKPHOS 76  BILITOT 1.0  PROT 8.5*  ALBUMIN 4.3   No results for input(s): LIPASE, AMYLASE in the last 168 hours. Recent Labs  Lab 04/02/20 1515  AMMONIA 24   Coagulation Profile: No results for input(s): INR, PROTIME in the last 168 hours. Cardiac Enzymes: No results for input(s): CKTOTAL, CKMB, CKMBINDEX, TROPONINI in the last 168 hours. BNP (last 3 results) No results for input(s): PROBNP in the last 8760 hours. HbA1C: No results for input(s): HGBA1C in the last 72 hours. CBG: Recent Labs  Lab 04/02/20 1522  GLUCAP 253*   Lipid Profile: No results for input(s): CHOL, HDL, LDLCALC, TRIG, CHOLHDL, LDLDIRECT in the last 72 hours. Thyroid Function Tests: No results for input(s): TSH, T4TOTAL, FREET4, T3FREE, THYROIDAB in the last 72 hours. Anemia Panel: No results for  input(s): VITAMINB12, FOLATE, FERRITIN, TIBC, IRON, RETICCTPCT in the last 72 hours. Urine analysis:    Component Value Date/Time   COLORURINE YELLOW 04/02/2020 1918   APPEARANCEUR HAZY (A) 04/02/2020 1918   LABSPEC 1.010 04/02/2020 1918   PHURINE 6.0 04/02/2020 1918   GLUCOSEU NEGATIVE 04/02/2020 1918   HGBUR MODERATE (A) 04/02/2020 1918   BILIRUBINUR NEGATIVE 04/02/2020 1918   KETONESUR NEGATIVE 04/02/2020 1918   PROTEINUR NEGATIVE 04/02/2020 1918   UROBILINOGEN 1.0 08/07/2007 0800   NITRITE NEGATIVE 04/02/2020 1918   LEUKOCYTESUR SMALL (A) 04/02/2020 1918    Radiological Exams on Admission: CT ABDOMEN PELVIS WO CONTRAST  Result Date: 04/02/2020 CLINICAL DATA:  Acute abdominal pain.  Decreased responsiveness. EXAM: CT ABDOMEN AND PELVIS WITHOUT CONTRAST TECHNIQUE: Multidetector CT imaging of the abdomen and pelvis was performed following the standard protocol without IV contrast. COMPARISON:  03/16/2020.  03/01/2020 FINDINGS: Lower chest: The lung bases are clear. Hepatobiliary: The gallbladder is moderately distended. No stones  or wall thickening. No bile duct dilatation. Unenhanced appearance of the liver is unremarkable. Pancreas: Unremarkable. No pancreatic ductal dilatation or surrounding inflammatory changes. Spleen: Normal in size without focal abnormality. Adrenals/Urinary Tract: Adrenal glands are unremarkable. Kidneys are normal, without renal calculi, focal lesion, or hydronephrosis. Bladder is unremarkable. Stomach/Bowel: Stomach, small bowel, and colon are not abnormally distended. No wall thickening or inflammatory changes are demonstrated. There is persistent finding of residual contrast material demonstrated throughout the colon and rectum. Streak artifact arising from contrast material limits examination. The appendix is normal. Vascular/Lymphatic: Aortic atherosclerosis. No enlarged abdominal or pelvic lymph nodes. Reproductive: Uterus is normal in size. Scattered uterine  calcifications consistent with fibroids. No abnormal adnexal masses. Other: No abdominal wall hernia or abnormality. No abdominopelvic ascites. Musculoskeletal: No acute or significant osseous findings. IMPRESSION: 1. No acute process demonstrated in the abdomen or pelvis on noncontrast imaging. No evidence of bowel obstruction or inflammation. Residual contrast material demonstrated throughout the colon. 2. Aortic atherosclerosis. 3. Uterine fibroids. Aortic Atherosclerosis (ICD10-I70.0). Electronically Signed   By: Burman Nieves M.D.   On: 04/02/2020 19:13   CT Head Wo Contrast  Result Date: 04/02/2020 CLINICAL DATA:  Decreased responsiveness. Mental status change of unknown cause. EXAM: CT HEAD WITHOUT CONTRAST TECHNIQUE: Contiguous axial images were obtained from the base of the skull through the vertex without intravenous contrast. COMPARISON:  CT 02/29/2020.  MRI 03/01/2020 FINDINGS: Brain: Diffuse cerebral atrophy. Ventricular dilatation consistent with central atrophy. Low-attenuation changes in the deep white matter consistent with small vessel ischemia. No abnormal extra-axial fluid collections. No mass effect or midline shift. Gray-white matter junctions are distinct. Basal cisterns are not effaced. No acute intracranial hemorrhage. Vascular: Intracranial arterial calcifications. Skull: Calvarium appears intact. Sinuses/Orbits: Paranasal sinuses and mastoid air cells are clear. Other: None. IMPRESSION: 1. No acute intracranial abnormalities. 2. Chronic atrophy and small vessel ischemia. Electronically Signed   By: Burman Nieves M.D.   On: 04/02/2020 19:16   DG Chest Portable 1 View  Result Date: 04/02/2020 CLINICAL DATA:  Tachycardia EXAM: PORTABLE CHEST 1 VIEW COMPARISON:  02/15/2020 FINDINGS: The heart size and mediastinal contours are within normal limits. Both lungs are clear. The visualized skeletal structures are unremarkable. Residual contrast material in the visualized colon.  IMPRESSION: No active disease. Electronically Signed   By: Burman Nieves M.D.   On: 04/02/2020 20:07    EKG: Independently reviewed. Sinus tach  Assessment/Plan Principal Problem:   AKI (acute kidney injury) (HCC) Active Problems:   Asthma with acute exacerbation   Diabetes (HCC)   Acute encephalopathy   Acute metabolic encephalopathy   # Acute toxic metabolic encephalopathy - severe AKI likely primary driver, now has hypernatremia, likely gap acidosis. Appears to be secondary to lack of PO intake. Unclear what precipitated this. Possible seizure given recent dx of seizure disorder. Ammonia, salicylates, tylenol, ethanol, uds are all negative. Glucose elevated to about 300, and with report of vomiting at home, this could be early DKA. Temp low, urinalysis equivocal, could have uti. CT head, abdomen pelvis, and cxr w/o acute findings. S/p 3 L fluid resuscitation in ED. Initial lactate 4.5, improved to 3 with fluids. Per previous d/c summary there does appear to be some degree of baseline dementia. - d5 1/2ns @ 150 - repeat bmp now and in AM - f/u b12/tsh - likely benefit from neuro consult in AM - given equivocal urinalysis and elevated wbcs and low temp and inability to get accurate hx from patient will cover for possible uti  with ceftriaxone 1 g qd given renal function - f/u vbg - monitor K, may need repletion (initial K 3.4) - tele overnight  # Hypernatremia - 2/2 dehydration - s/p 3 L NS as above, fluids now 1/2 ns @ 150 - repeat bmp now and in AM  # Hyperglycemia - possible DKA as above - home lantus; SSI  # Seizure disorder - see above - f/u keppra level, per sister has been off meds for several weeks - keppra 250 qd, renally dosed - hold home gabapentin  # HTN here bp wnl - hold home losartan, atorvastatin, aspirin  # Asthma - at baseline, it appears - home dulera, singulair, loratadine  DVT prophylaxis: heparin Code Status: full code per sister  Family  Communication: phone call w/ sister  Disposition Plan: tbd  Consults called: none  Admission status: med/surg tele    Silvano Bilis MD [facility] Pager [phone]  If 7PM-7AM, please contact night-coverage [email or web address] Password [facility]  04/02/2020, 8:17 PM

## 2020-04-03 ENCOUNTER — Encounter (HOSPITAL_COMMUNITY): Payer: Self-pay | Admitting: Obstetrics and Gynecology

## 2020-04-03 ENCOUNTER — Inpatient Hospital Stay (HOSPITAL_COMMUNITY): Payer: Self-pay

## 2020-04-03 DIAGNOSIS — E860 Dehydration: Secondary | ICD-10-CM

## 2020-04-03 DIAGNOSIS — J45901 Unspecified asthma with (acute) exacerbation: Secondary | ICD-10-CM

## 2020-04-03 LAB — COMPREHENSIVE METABOLIC PANEL
ALT: 23 U/L (ref 0–44)
AST: 27 U/L (ref 15–41)
Albumin: 4.1 g/dL (ref 3.5–5.0)
Alkaline Phosphatase: 72 U/L (ref 38–126)
Anion gap: 13 (ref 5–15)
BUN: 84 mg/dL — ABNORMAL HIGH (ref 6–20)
CO2: 22 mmol/L (ref 22–32)
Calcium: 9.8 mg/dL (ref 8.9–10.3)
Chloride: 116 mmol/L — ABNORMAL HIGH (ref 98–111)
Creatinine, Ser: 2.4 mg/dL — ABNORMAL HIGH (ref 0.44–1.00)
GFR calc Af Amer: 25 mL/min — ABNORMAL LOW (ref >60)
GFR calc non Af Amer: 22 mL/min — ABNORMAL LOW (ref >60)
Glucose, Bld: 243 mg/dL — ABNORMAL HIGH (ref 70–99)
Potassium: 3 mmol/L — ABNORMAL LOW (ref 3.5–5.1)
Sodium: 151 mmol/L — ABNORMAL HIGH (ref 135–145)
Total Bilirubin: 0.7 mg/dL (ref 0.3–1.2)
Total Protein: 8.1 g/dL (ref 6.5–8.1)

## 2020-04-03 LAB — CBC
HCT: 44.1 % (ref 36.0–46.0)
Hemoglobin: 13.7 g/dL (ref 12.0–15.0)
MCH: 28.2 pg (ref 26.0–34.0)
MCHC: 31.1 g/dL (ref 30.0–36.0)
MCV: 90.9 fL (ref 80.0–100.0)
Platelets: 403 10*3/uL — ABNORMAL HIGH (ref 150–400)
RBC: 4.85 MIL/uL (ref 3.87–5.11)
RDW: 14.1 % (ref 11.5–15.5)
WBC: 13.8 10*3/uL — ABNORMAL HIGH (ref 4.0–10.5)
nRBC: 0 % (ref 0.0–0.2)

## 2020-04-03 LAB — LACTIC ACID, PLASMA: Lactic Acid, Venous: 3.4 mmol/L (ref 0.5–1.9)

## 2020-04-03 LAB — BASIC METABOLIC PANEL
Anion gap: 20 — ABNORMAL HIGH (ref 5–15)
BUN: 78 mg/dL — ABNORMAL HIGH (ref 6–20)
CO2: 12 mmol/L — ABNORMAL LOW (ref 22–32)
Calcium: 9 mg/dL (ref 8.9–10.3)
Chloride: 122 mmol/L — ABNORMAL HIGH (ref 98–111)
Creatinine, Ser: 2.19 mg/dL — ABNORMAL HIGH (ref 0.44–1.00)
GFR calc Af Amer: 28 mL/min — ABNORMAL LOW (ref >60)
GFR calc non Af Amer: 24 mL/min — ABNORMAL LOW (ref >60)
Glucose, Bld: 146 mg/dL — ABNORMAL HIGH (ref 70–99)
Potassium: 4.2 mmol/L (ref 3.5–5.1)
Sodium: 154 mmol/L — ABNORMAL HIGH (ref 135–145)

## 2020-04-03 LAB — VITAMIN B12: Vitamin B-12: 1034 pg/mL — ABNORMAL HIGH (ref 180–914)

## 2020-04-03 LAB — GLUCOSE, CAPILLARY
Glucose-Capillary: 261 mg/dL — ABNORMAL HIGH (ref 70–99)
Glucose-Capillary: 192 mg/dL — ABNORMAL HIGH (ref 70–99)
Glucose-Capillary: 108 mg/dL — ABNORMAL HIGH (ref 70–99)
Glucose-Capillary: 116 mg/dL — ABNORMAL HIGH (ref 70–99)

## 2020-04-03 LAB — BLOOD GAS, VENOUS
Acid-base deficit: 2.1 mmol/L — ABNORMAL HIGH (ref 0.0–2.0)
Bicarbonate: 22.3 mmol/L (ref 20.0–28.0)
Drawn by: 4653
FIO2: 21
O2 Saturation: 60.5 %
Patient temperature: 37
pCO2, Ven: 38.9 mmHg — ABNORMAL LOW (ref 44.0–60.0)
pH, Ven: 7.377 (ref 7.250–7.430)
pO2, Ven: 37.2 mmHg (ref 32.0–45.0)

## 2020-04-03 LAB — PROTIME-INR
INR: 1.3 — ABNORMAL HIGH (ref 0.8–1.2)
Prothrombin Time: 15.9 seconds — ABNORMAL HIGH (ref 11.4–15.2)

## 2020-04-03 LAB — TSH: TSH: 1.518 u[IU]/mL (ref 0.350–4.500)

## 2020-04-03 MED ORDER — LORAZEPAM 2 MG/ML IJ SOLN
1.0000 mg | Freq: Once | INTRAMUSCULAR | Status: AC | PRN
  Administered 2020-04-03: 1 mg via INTRAVENOUS
  Filled 2020-04-03: qty 1

## 2020-04-03 MED ORDER — SODIUM CHLORIDE 0.45 % IV SOLN: INTRAVENOUS | Status: DC

## 2020-04-03 MED ORDER — INSULIN GLARGINE 100 UNIT/ML ~~LOC~~ SOLN
10.0000 [IU] | Freq: Two times a day (BID) | SUBCUTANEOUS | Status: DC
  Administered 2020-04-03 – 2020-04-11 (×17): 10 [IU] via SUBCUTANEOUS
  Filled 2020-04-03 (×21): qty 0.1

## 2020-04-03 NOTE — Progress Notes (Signed)
PROGRESS NOTE    Tamara [name]  JKK:[id number] DOB: Aug 12, 1961 DOA: 04/02/2020 PCP: [name], NP   Brief Narrative:  HPI: Tamara [name] is a 58 y.o. female with medical history significant for dm, htn, ckd, asthma, recent hospitalization for unresponsiveness/AMS where diagnosed w/ seizure disorder and started on anticonvulsant, presenting with above.  History obtained from patient's sister as pt not answering questions.  Sister reports several weeks of worsening mental status. Frequently not making sense, confused. Few days of not moving much. Has not been taking meds and rarely eating/drinking. No witnessed falls or seizure like activity. No reported fevers. Sister does say pt has had intermittent nbnb emesis. No diarrhea. No complaints of dysuria. No report of toxic habits or unintentional overdose.  ED Course: Labs, imaging, 3 L NS   Assessment & Plan:   Principal Problem:   AKI (acute kidney injury) (HCC) Active Problems:   Asthma with acute exacerbation   Diabetes (HCC)   Acute encephalopathy   Acute metabolic encephalopathy   Acute toxic metabolic encephalopathy - severe AKI likely primary driver, now has hypernatremia, likely gap acidosis. Appears to be secondary to lack of PO intake. Unclear what precipitated this. Possible seizure given recent dx of seizure disorder. Ammonia, salicylates, tylenol, ethanol, uds are all negative. Glucose elevated to about 300, and with report of vomiting at home, this could be early DKA. Temp low, urinalysis equivocal, could have uti. CT head, abdomen pelvis, and cxr w/o acute findings. S/p 3 L fluid resuscitation in ED. Initial lactate 4.5, improved to 3 with fluids. Per previous d/c summary there does appear to be some degree of baseline dementia. - D/C d5 1/2ns @ 150, CHANGE TO 1/2 NS givenm admitter concern for possible early dka - bmp this eve - f/u b12/tsh - if no improvement with aggressive hydration neuro consult in AM - MRI  brain today - agree, given equivocal urinalysis and elevated wbcs and low temp and inability to get accurate hx from patient will cover for possible uti with ceftriaxone 1 g qd given renal function - monitor K, - tele overnight  AKI; -2/2 profound dehydration, already significant improvmeent,recheck in AM  -neph consult if doesn't continue to improve -bmp tonight and am  Hypernatremia - 2/2 dehydration - s/p 3 L NS as above, fluids now 1/2 ns @ 150 - repeat bmp tonight and in AM   Hyperglycemia - possible DKA as above -as above d/c d5, change to 1/2 ns - home lantus; SSI  Seizure disorder - see above - f/u keppra level, per sister has been off meds for several weeks - keppra 250 qd, renally dosed - hold home gabapentin  HTN here bp wnl - hold home losartan, atorvastatin, aspirin  Asthma - at baseline, it appears - home dulera, singulair, loratadine  DVT prophylaxis: Heparin SQ  Code Status: full    Code Status Orders  (From admission, onward)         Start     Ordered   04/02/20 2021  Full code  Continuous        04/02/20 2020        Code Status History    Date Active Date Inactive Code Status Order ID Comments User Context   03/01/2020 0034 03/04/2020 1623 Full Code [id number]  Frankey Shown, DO ED   11/03/2018 1841 11/04/2018 1950 Full Code [id number]  Dollene Cleveland, DO Inpatient   Advance Care Planning Activity     Family Communication: sister at bedside  Disposition  Plan:   Status is: Inpatient  Remains inpatient appropriate because:Inpatient level of care appropriate due to severity of illness   Dispo: The patient is from: Home              Anticipated d/c is to: Home              Anticipated d/c date is: 3 days              Patient currently is not medically stable to d/c.       Consults called: None Admission status: Inpatient   Consultants:   None  Procedures:  CT ABDOMEN PELVIS WO CONTRAST  Result Date: 04/02/2020 CLINICAL  DATA:  Acute abdominal pain.  Decreased responsiveness. EXAM: CT ABDOMEN AND PELVIS WITHOUT CONTRAST TECHNIQUE: Multidetector CT imaging of the abdomen and pelvis was performed following the standard protocol without IV contrast. COMPARISON:  03/16/2020.  03/01/2020 FINDINGS: Lower chest: The lung bases are clear. Hepatobiliary: The gallbladder is moderately distended. No stones or wall thickening. No bile duct dilatation. Unenhanced appearance of the liver is unremarkable. Pancreas: Unremarkable. No pancreatic ductal dilatation or surrounding inflammatory changes. Spleen: Normal in size without focal abnormality. Adrenals/Urinary Tract: Adrenal glands are unremarkable. Kidneys are normal, without renal calculi, focal lesion, or hydronephrosis. Bladder is unremarkable. Stomach/Bowel: Stomach, small bowel, and colon are not abnormally distended. No wall thickening or inflammatory changes are demonstrated. There is persistent finding of residual contrast material demonstrated throughout the colon and rectum. Streak artifact arising from contrast material limits examination. The appendix is normal. Vascular/Lymphatic: Aortic atherosclerosis. No enlarged abdominal or pelvic lymph nodes. Reproductive: Uterus is normal in size. Scattered uterine calcifications consistent with fibroids. No abnormal adnexal masses. Other: No abdominal wall hernia or abnormality. No abdominopelvic ascites. Musculoskeletal: No acute or significant osseous findings. IMPRESSION: 1. No acute process demonstrated in the abdomen or pelvis on noncontrast imaging. No evidence of bowel obstruction or inflammation. Residual contrast material demonstrated throughout the colon. 2. Aortic atherosclerosis. 3. Uterine fibroids. Aortic Atherosclerosis (ICD10-I70.0). Electronically Signed   By: Burman Nieves M.D.   On: 04/02/2020 19:13   CT Head Wo Contrast  Result Date: 04/02/2020 CLINICAL DATA:  Decreased responsiveness. Mental status change of  unknown cause. EXAM: CT HEAD WITHOUT CONTRAST TECHNIQUE: Contiguous axial images were obtained from the base of the skull through the vertex without intravenous contrast. COMPARISON:  CT 02/29/2020.  MRI 03/01/2020 FINDINGS: Brain: Diffuse cerebral atrophy. Ventricular dilatation consistent with central atrophy. Low-attenuation changes in the deep white matter consistent with small vessel ischemia. No abnormal extra-axial fluid collections. No mass effect or midline shift. Gray-white matter junctions are distinct. Basal cisterns are not effaced. No acute intracranial hemorrhage. Vascular: Intracranial arterial calcifications. Skull: Calvarium appears intact. Sinuses/Orbits: Paranasal sinuses and mastoid air cells are clear. Other: None. IMPRESSION: 1. No acute intracranial abnormalities. 2. Chronic atrophy and small vessel ischemia. Electronically Signed   By: Burman Nieves M.D.   On: 04/02/2020 19:16   CT ABDOMEN PELVIS W CONTRAST  Result Date: 03/16/2020 CLINICAL DATA:  Epigastric pain. EXAM: CT ABDOMEN AND PELVIS WITH CONTRAST TECHNIQUE: Multidetector CT imaging of the abdomen and pelvis was performed using the standard protocol following bolus administration of intravenous contrast. CONTRAST:  80mL OMNIPAQUE IOHEXOL 300 MG/ML  SOLN COMPARISON:  March 01, 2020 FINDINGS: Lower chest: No acute abnormality. Hepatobiliary: No focal liver abnormality is seen. No gallstones, gallbladder wall thickening, or biliary dilatation. Pancreas: Unremarkable. No pancreatic ductal dilatation or surrounding inflammatory changes. Spleen: Normal in size without  focal abnormality. Adrenals/Urinary Tract: Adrenal glands are unremarkable. Kidneys are normal, without renal calculi, focal lesion, or hydronephrosis. Bladder is unremarkable. Stomach/Bowel: Stomach is within normal limits. Appendix appears normal. A large amount of barium is seen throughout the large bowel. No evidence of bowel wall thickening, distention, or  inflammatory changes. Vascular/Lymphatic: There is mild calcification of the abdominal aorta. No enlarged abdominal or pelvic lymph nodes. Reproductive: Partially calcified and noncalcified heterogeneous uterine fibroids are seen. The largest measures approximately 2.1 cm x 1.6 cm. Other: No abdominal wall hernia or abnormality. No abdominopelvic ascites. Musculoskeletal: No acute or significant osseous findings. IMPRESSION: 1. Large amount of barium throughout the large bowel with associated streak artifact. 2. Partially calcified and noncalcified heterogeneous uterine fibroids. 3. Aortic atherosclerosis. Aortic Atherosclerosis (ICD10-I70.0). Electronically Signed   By: Aram Candela M.D.   On: 03/16/2020 03:27   DG Chest Portable 1 View  Result Date: 04/02/2020 CLINICAL DATA:  Tachycardia EXAM: PORTABLE CHEST 1 VIEW COMPARISON:  02/15/2020 FINDINGS: The heart size and mediastinal contours are within normal limits. Both lungs are clear. The visualized skeletal structures are unremarkable. Residual contrast material in the visualized colon. IMPRESSION: No active disease. Electronically Signed   By: Burman Nieves M.D.   On: 04/02/2020 20:07   DG Foot Complete Left  Result Date: 03/16/2020 CLINICAL DATA:  First digit pain EXAM: LEFT FOOT - COMPLETE 3+ VIEW COMPARISON:  None. FINDINGS: There is no evidence of fracture or dislocation. There is no evidence of arthropathy or other focal bone abnormality. Soft tissues are unremarkable. IMPRESSION: Negative. Electronically Signed   By: Katherine Mantle M.D.   On: 03/16/2020 01:12     Antimicrobials:   tx day 2    Subjective: Confused minimally responsive  Objective: Vitals:   04/03/20 0349 04/03/20 0903 04/03/20 1156 04/03/20 1557  BP: 137/77 (!) 114/95 98/77 99/65   Pulse: 65 86 87 68  Resp: Temp: 97.7 F (36.5 C) 97.6 F (36.4 C) 97.7 F (36.5 C) (!) 97.4 F (36.3 C)  TempSrc: Axillary Oral Oral Oral  SpO2: 100% 99% 97%  92%  Weight:      Height:        Intake/Output Summary (Last 24 hours) at 04/03/2020 1603 Last data filed at 04/03/2020 1315 Gross per 24 hour  Intake 2340.04 ml  Output 1625 ml  Net 715.04 ml   Filed Weights   04/02/20 2318  Weight: 54.1 kg    Examination:  General exam: confused min responsive Respiratory system: Clear to auscultation. Respiratory effort normal. Cardiovascular system: S1 & S2 heard, RRR. No JVD, murmurs, rubs, gallops or clicks. No pedal edema. Gastrointestinal system: Abdomen is nondistended, soft and nontender. No organomegaly or masses felt. Normal bowel sounds heard. Central nervous system: awake, moves all 4 ext. Extremities: wwp, nv intact Skin: No rashes, lesions or ulcers Psychiatry: Judgement and insight impaired, flat affect    Data Reviewed: I have personally reviewed following labs and imaging studies  CBC: Recent Labs  Lab 04/02/20 1514 04/03/20 0918  WBC 15.9* 13.8*  NEUTROABS 13.0*  --   HGB 13.9 13.7  HCT 44.6 44.1  MCV 91.4 90.9  PLT 445* 403*   Basic Metabolic Panel: Recent Labs  Lab 04/02/20 1514 04/03/20 0918  NA 153* 151*  K 3.4* 3.0*  CL 111 116*  CO2 18* 22  GLUCOSE 293* 243*  BUN 141* 84*  CREATININE 4.88* 2.40*  CALCIUM 10.7* 9.8   GFR: Estimated Creatinine Clearance: 22.1 mL/min (A) (  by C-G formula based on SCr of 2.4 mg/dL (H)). Liver Function Tests: Recent Labs  Lab 04/02/20 1514 04/03/20 0918  AST 24 27  ALT 25 23  ALKPHOS 76 72  BILITOT 1.0 0.7  PROT 8.5* 8.1  ALBUMIN 4.3 4.1   No results for input(s): LIPASE, AMYLASE in the last 168 hours. Recent Labs  Lab 04/02/20 1515  AMMONIA 24   Coagulation Profile: Recent Labs  Lab 04/03/20 0918  INR 1.3*   Cardiac Enzymes: No results for input(s): CKTOTAL, CKMB, CKMBINDEX, TROPONINI in the last 168 hours. BNP (last 3 results) No results for input(s): PROBNP in the last 8760 hours. HbA1C: No results for input(s): HGBA1C in the last 72  hours. CBG: Recent Labs  Lab 04/02/20 1522 04/02/20 2222 04/03/20 0631 04/03/20 1127  GLUCAP 253* 180* 261* 192*   Lipid Profile: No results for input(s): CHOL, HDL, LDLCALC, TRIG, CHOLHDL, LDLDIRECT in the last 72 hours. Thyroid Function Tests: Recent Labs    04/03/20 0918  TSH 1.518   Anemia Panel: Recent Labs    04/03/20 0918  VITAMINB12 1,034*   Sepsis Labs: Recent Labs  Lab 04/02/20 1515 04/02/20 1716 04/03/20 0918  LATICACIDVEN 4.5* 3.0* 3.4*    Recent Results (from the past 240 hour(s))  Blood culture (routine x 2)     Status: None (Preliminary result)   Collection Time: 04/02/20  5:17 PM   Specimen: BLOOD RIGHT FOREARM  Result Value Ref Range Status   Specimen Description BLOOD RIGHT FOREARM  Final   Special Requests   Final    BOTTLES DRAWN AEROBIC AND ANAEROBIC Blood Culture results may not be optimal due to an inadequate volume of blood received in culture bottles   Culture   Final    NO GROWTH < 24 HOURS Performed at [facility], [address] [place], [place] [address]    Report Status PENDING  Incomplete  Blood culture (routine x 2)     Status: None (Preliminary result)   Collection Time: 04/02/20  5:19 PM   Specimen: BLOOD RIGHT WRIST  Result Value Ref Range Status   Specimen Description BLOOD RIGHT WRIST  Final   Special Requests   Final    BOTTLES DRAWN AEROBIC ONLY Blood Culture results may not be optimal due to an inadequate volume of blood received in culture bottles   Culture   Final    NO GROWTH < 24 HOURS Performed at [facility], [address] [place], [place] [address]    Report Status PENDING  Incomplete  SARS Coronavirus 2 by RT PCR (hospital order, performed in [facility]) Nasopharyngeal Nasopharyngeal Swab     Status: None   Collection Time: 04/02/20  6:06 PM   Specimen: Nasopharyngeal Swab  Result Value Ref Range Status   SARS Coronavirus 2 NEGATIVE NEGATIVE Final    Comment:  (NOTE) SARS-CoV-2 target nucleic acids are NOT DETECTED.  The SARS-CoV-2 RNA is generally detectable in upper and lower respiratory specimens during the acute phase of infection. The lowest concentration of SARS-CoV-2 viral copies this assay can detect is 250 copies / mL. A negative result does not preclude SARS-CoV-2 infection and should not be used as the sole basis for treatment or other patient management decisions.  A negative result may occur with improper specimen collection / handling, submission of specimen other than nasopharyngeal swab, presence of viral mutation(s) within the areas targeted by this assay, and inadequate number of viral copies (<250 copies /  mL). A negative result must be combined with clinical observations, patient history, and epidemiological information.  Fact Sheet for Patients:   BoilerBrush.com.cy  Fact Sheet for Healthcare Providers: https://pope.com/  This test is not yet approved or  cleared by the [place] FDA and has been authorized for detection and/or diagnosis of SARS-CoV-2 by FDA under an Emergency Use Authorization (EUA).  This EUA will remain in effect (meaning this test can be used) for the duration of the COVID-19 declaration under Section 564(b)(1) of the Act, 21 U.S.C. section 360bbb-3(b)(1), unless the authorization is terminated or revoked sooner.  Performed at [facility], [address], [place], [place] [address]          Radiology Studies: CT ABDOMEN PELVIS WO CONTRAST  Result Date: 04/02/2020 CLINICAL DATA:  Acute abdominal pain.  Decreased responsiveness. EXAM: CT ABDOMEN AND PELVIS WITHOUT CONTRAST TECHNIQUE: Multidetector CT imaging of the abdomen and pelvis was performed following the standard protocol without IV contrast. COMPARISON:  03/16/2020.  03/01/2020 FINDINGS: Lower chest: The lung bases are clear. Hepatobiliary: The gallbladder is moderately  distended. No stones or wall thickening. No bile duct dilatation. Unenhanced appearance of the liver is unremarkable. Pancreas: Unremarkable. No pancreatic ductal dilatation or surrounding inflammatory changes. Spleen: Normal in size without focal abnormality. Adrenals/Urinary Tract: Adrenal glands are unremarkable. Kidneys are normal, without renal calculi, focal lesion, or hydronephrosis. Bladder is unremarkable. Stomach/Bowel: Stomach, small bowel, and colon are not abnormally distended. No wall thickening or inflammatory changes are demonstrated. There is persistent finding of residual contrast material demonstrated throughout the colon and rectum. Streak artifact arising from contrast material limits examination. The appendix is normal. Vascular/Lymphatic: Aortic atherosclerosis. No enlarged abdominal or pelvic lymph nodes. Reproductive: Uterus is normal in size. Scattered uterine calcifications consistent with fibroids. No abnormal adnexal masses. Other: No abdominal wall hernia or abnormality. No abdominopelvic ascites. Musculoskeletal: No acute or significant osseous findings. IMPRESSION: 1. No acute process demonstrated in the abdomen or pelvis on noncontrast imaging. No evidence of bowel obstruction or inflammation. Residual contrast material demonstrated throughout the colon. 2. Aortic atherosclerosis. 3. Uterine fibroids. Aortic Atherosclerosis (ICD10-I70.0). Electronically Signed   By: Burman Nieves M.D.   On: 04/02/2020 19:13   CT Head Wo Contrast  Result Date: 04/02/2020 CLINICAL DATA:  Decreased responsiveness. Mental status change of unknown cause. EXAM: CT HEAD WITHOUT CONTRAST TECHNIQUE: Contiguous axial images were obtained from the base of the skull through the vertex without intravenous contrast. COMPARISON:  CT 02/29/2020.  MRI 03/01/2020 FINDINGS: Brain: Diffuse cerebral atrophy. Ventricular dilatation consistent with central atrophy. Low-attenuation changes in the deep white matter  consistent with small vessel ischemia. No abnormal extra-axial fluid collections. No mass effect or midline shift. Gray-white matter junctions are distinct. Basal cisterns are not effaced. No acute intracranial hemorrhage. Vascular: Intracranial arterial calcifications. Skull: Calvarium appears intact. Sinuses/Orbits: Paranasal sinuses and mastoid air cells are clear. Other: None. IMPRESSION: 1. No acute intracranial abnormalities. 2. Chronic atrophy and small vessel ischemia. Electronically Signed   By: Burman Nieves M.D.   On: 04/02/2020 19:16   DG Chest Portable 1 View  Result Date: 04/02/2020 CLINICAL DATA:  Tachycardia EXAM: PORTABLE CHEST 1 VIEW COMPARISON:  02/15/2020 FINDINGS: The heart size and mediastinal contours are within normal limits. Both lungs are clear. The visualized skeletal structures are unremarkable. Residual contrast material in the visualized colon. IMPRESSION: No active disease. Electronically Signed   By: Burman Nieves M.D.   On: 04/02/2020 20:07  Scheduled Meds: . folic acid  1 mg Oral Daily  . heparin  5,000 Units Subcutaneous Q8H  . insulin aspart  0-15 Units Subcutaneous TID WC  . insulin glargine  10 Units Subcutaneous BID  . loratadine  10 mg Oral Daily  . mometasone-formoterol  2 puff Inhalation BID  . montelukast  10 mg Oral QHS  . polyethylene glycol  17 g Oral QHS   Continuous Infusions: . sodium chloride 150 mL/hr at 04/03/20 1341  . cefTRIAXone (ROCEPHIN)  IV Stopped (04/02/20 2151)     LOS: 1 day    Time spent: 35 min    Burke Keels, MD [facility]  If 7PM-7AM, please contact night-coverage  04/03/2020, 4:03 PM

## 2020-04-03 NOTE — Progress Notes (Signed)
Patient returned from MRI, noted lethargic and does response to voice. VSS. Patiened declined meds at this time. Will continue to monitor.

## 2020-04-03 NOTE — Progress Notes (Signed)
   04/03/20 2030  Assess: MEWS Score  Level of Consciousness Responds to Voice  Assess: MEWS Score  MEWS Temp 0  MEWS Systolic 1  MEWS Pulse 0  MEWS RR 0  MEWS LOC 1  MEWS Score 2  MEWS Score Color Yellow  Assess: if the MEWS score is Yellow or Red  Were vital signs taken at a resting state? Yes  Focused Assessment No change from prior assessment  Treat  MEWS Interventions Other (Comment) (nopmne at this time)  Pain Scale 0-10  Take Vital Signs  Increase Vital Sign Frequency  Yellow: Q 2hr X 2 then Q 4hr X 2, if remains yellow, continue Q 4hrs  Escalate  MEWS: Escalate Yellow: discuss with charge nurse/RN and consider discussing with provider and RRT  Notify: Charge Nurse/RN  Name of Charge Nurse/RN Notified Phyl, RN  Date Charge Nurse/RN Notified 04/03/20  Time Charge Nurse/RN Notified 2114    Yellow MEW score due to ativan given previously for MRI. Upon returning from MRI, pt still noted lethargic, able to response to voice. Denied pain, VSS. CN RN aware. Will follow MEW protocol report to on coming RN .

## 2020-04-04 ENCOUNTER — Inpatient Hospital Stay (HOSPITAL_COMMUNITY): Payer: Self-pay

## 2020-04-04 DIAGNOSIS — E870 Hyperosmolality and hypernatremia: Secondary | ICD-10-CM

## 2020-04-04 DIAGNOSIS — G9340 Encephalopathy, unspecified: Secondary | ICD-10-CM

## 2020-04-04 DIAGNOSIS — G40909 Epilepsy, unspecified, not intractable, without status epilepticus: Secondary | ICD-10-CM

## 2020-04-04 DIAGNOSIS — R739 Hyperglycemia, unspecified: Secondary | ICD-10-CM

## 2020-04-04 DIAGNOSIS — I10 Essential (primary) hypertension: Secondary | ICD-10-CM

## 2020-04-04 DIAGNOSIS — G9341 Metabolic encephalopathy: Secondary | ICD-10-CM

## 2020-04-04 LAB — BLOOD CULTURE ID PANEL (REFLEXED) - BCID2

## 2020-04-04 LAB — BASIC METABOLIC PANEL
Anion gap: 18 — ABNORMAL HIGH (ref 5–15)
Anion gap: 15 (ref 5–15)
Anion gap: 13 (ref 5–15)
BUN: 61 mg/dL — ABNORMAL HIGH (ref 6–20)
BUN: 55 mg/dL — ABNORMAL HIGH (ref 6–20)
BUN: 45 mg/dL — ABNORMAL HIGH (ref 6–20)
CO2: 18 mmol/L — ABNORMAL LOW (ref 22–32)
CO2: 18 mmol/L — ABNORMAL LOW (ref 22–32)
CO2: 21 mmol/L — ABNORMAL LOW (ref 22–32)
Calcium: 8.9 mg/dL (ref 8.9–10.3)
Calcium: 8.4 mg/dL — ABNORMAL LOW (ref 8.9–10.3)
Calcium: 8.5 mg/dL — ABNORMAL LOW (ref 8.9–10.3)
Chloride: 115 mmol/L — ABNORMAL HIGH (ref 98–111)
Chloride: 117 mmol/L — ABNORMAL HIGH (ref 98–111)
Chloride: 114 mmol/L — ABNORMAL HIGH (ref 98–111)
Creatinine, Ser: 1.56 mg/dL — ABNORMAL HIGH (ref 0.44–1.00)
Creatinine, Ser: 1.75 mg/dL — ABNORMAL HIGH (ref 0.44–1.00)
Creatinine, Ser: 1.68 mg/dL — ABNORMAL HIGH (ref 0.44–1.00)
GFR calc Af Amer: 42 mL/min — ABNORMAL LOW (ref >60)
GFR calc Af Amer: 37 mL/min — ABNORMAL LOW (ref >60)
GFR calc Af Amer: 39 mL/min — ABNORMAL LOW (ref >60)
GFR calc non Af Amer: 37 mL/min — ABNORMAL LOW (ref >60)
GFR calc non Af Amer: 32 mL/min — ABNORMAL LOW (ref >60)
GFR calc non Af Amer: 33 mL/min — ABNORMAL LOW (ref >60)
Glucose, Bld: 149 mg/dL — ABNORMAL HIGH (ref 70–99)
Glucose, Bld: 155 mg/dL — ABNORMAL HIGH (ref 70–99)
Glucose, Bld: 151 mg/dL — ABNORMAL HIGH (ref 70–99)
Potassium: 2.8 mmol/L — ABNORMAL LOW (ref 3.5–5.1)
Potassium: 4.2 mmol/L (ref 3.5–5.1)
Potassium: 2.9 mmol/L — ABNORMAL LOW (ref 3.5–5.1)
Sodium: 151 mmol/L — ABNORMAL HIGH (ref 135–145)
Sodium: 150 mmol/L — ABNORMAL HIGH (ref 135–145)
Sodium: 148 mmol/L — ABNORMAL HIGH (ref 135–145)

## 2020-04-04 LAB — CBC WITH DIFFERENTIAL/PLATELET
Abs Immature Granulocytes: 0.05 10*3/uL (ref 0.00–0.07)
Basophils Absolute: 0 10*3/uL (ref 0.0–0.1)
Basophils Relative: 0 %
Eosinophils Absolute: 0 10*3/uL (ref 0.0–0.5)
Eosinophils Relative: 0 %
HCT: 37.1 % (ref 36.0–46.0)
Hemoglobin: 11.6 g/dL — ABNORMAL LOW (ref 12.0–15.0)
Immature Granulocytes: 1 %
Lymphocytes Relative: 23 %
Lymphs Abs: 2.4 10*3/uL (ref 0.7–4.0)
MCH: 28.6 pg (ref 26.0–34.0)
MCHC: 31.3 g/dL (ref 30.0–36.0)
MCV: 91.6 fL (ref 80.0–100.0)
Monocytes Absolute: 0.6 10*3/uL (ref 0.1–1.0)
Monocytes Relative: 5 %
Neutro Abs: 7.4 10*3/uL (ref 1.7–7.7)
Neutrophils Relative %: 71 %
Platelets: 308 10*3/uL (ref 150–400)
RBC: 4.05 MIL/uL (ref 3.87–5.11)
RDW: 13.9 % (ref 11.5–15.5)
WBC: 10.5 10*3/uL (ref 4.0–10.5)
nRBC: 0 % (ref 0.0–0.2)

## 2020-04-04 LAB — URINE CULTURE

## 2020-04-04 LAB — GLUCOSE, CAPILLARY
Glucose-Capillary: 140 mg/dL — ABNORMAL HIGH (ref 70–99)
Glucose-Capillary: 146 mg/dL — ABNORMAL HIGH (ref 70–99)
Glucose-Capillary: 153 mg/dL — ABNORMAL HIGH (ref 70–99)
Glucose-Capillary: 148 mg/dL — ABNORMAL HIGH (ref 70–99)

## 2020-04-04 LAB — CSF CELL COUNT WITH DIFFERENTIAL
RBC Count, CSF: 1 /mm3 — ABNORMAL HIGH
Tube #: 3
WBC, CSF: 3 /mm3 (ref 0–5)

## 2020-04-04 LAB — PROTEIN AND GLUCOSE, CSF
Glucose, CSF: 88 mg/dL — ABNORMAL HIGH (ref 40–70)
Total  Protein, CSF: 47 mg/dL — ABNORMAL HIGH (ref 15–45)

## 2020-04-04 LAB — MAGNESIUM: Magnesium: 2.1 mg/dL (ref 1.7–2.4)

## 2020-04-04 MED ORDER — POTASSIUM CHLORIDE 10 MEQ/100ML IV SOLN
10.0000 meq | INTRAVENOUS | Status: AC
  Administered 2020-04-04 (×4): 10 meq via INTRAVENOUS
  Filled 2020-04-04 (×4): qty 100

## 2020-04-04 MED ORDER — HEPARIN SODIUM (PORCINE) 5000 UNIT/ML IJ SOLN
5000.0000 [IU] | Freq: Three times a day (TID) | INTRAMUSCULAR | Status: DC
  Administered 2020-04-04 – 2020-04-09 (×14): 5000 [IU] via SUBCUTANEOUS
  Filled 2020-04-04 (×14): qty 1

## 2020-04-04 MED ORDER — POTASSIUM CHLORIDE CRYS ER 20 MEQ PO TBCR: 40.0000 meq | EXTENDED_RELEASE_TABLET | Freq: Once | ORAL | Status: DC

## 2020-04-04 MED ORDER — POTASSIUM CL IN DEXTROSE 5% 20 MEQ/L IV SOLN
20.0000 meq | INTRAVENOUS | Status: DC
  Administered 2020-04-04 – 2020-04-05 (×4): 20 meq via INTRAVENOUS
  Filled 2020-04-04 (×4): qty 1000

## 2020-04-04 MED ORDER — THIAMINE HCL 100 MG/ML IJ SOLN
500.0000 mg | Freq: Three times a day (TID) | INTRAVENOUS | Status: AC
  Administered 2020-04-04 – 2020-04-07 (×9): 500 mg via INTRAVENOUS
  Filled 2020-04-04 (×9): qty 5

## 2020-04-04 MED ORDER — LEVETIRACETAM IN NACL 500 MG/100ML IV SOLN
500.0000 mg | Freq: Two times a day (BID) | INTRAVENOUS | Status: DC
  Administered 2020-04-04 – 2020-04-12 (×18): 500 mg via INTRAVENOUS
  Filled 2020-04-04 (×19): qty 100

## 2020-04-04 MED ORDER — THIAMINE HCL 100 MG/ML IJ SOLN
500.0000 mg | Freq: Three times a day (TID) | INTRAVENOUS | Status: DC
  Administered 2020-04-04: 500 mg via INTRAVENOUS
  Filled 2020-04-04 (×2): qty 5

## 2020-04-04 NOTE — Social Work (Addendum)
5:00p CSW received e-mail from [employer] stating a letterhead is needed explaining patient's medical condition, along with the following information for patient's daughter  Steffanie Dunn DOB december 1st 1992 [place], LSD 43 DC1/ E-6 Last 4 of SSN [phone]  Tyrell stated the [employer] Notification has to go directly to the [employer] from a doctor. CSW informed Tyrell she will need to provide additional information specifying where the information needs to be sent. She agreed to follow-up Monday.  9:30a CSW consulted to speak with patient's niece Everrett Coombe (daughter of [name]) regarding patient's daughter who is deployed in the [employer]. Tyrell [phone] stated patient's daughter is currently deployed and needs a [employer] notification from the MD in order to visit her mother. Tyrell agreed to e-mail CSW everything that is needed so that it can be forwarded to the MD.   Verlon Au, LCSWA Clinical Social Worker

## 2020-04-04 NOTE — Consult Note (Addendum)
NEURO HOSPITALIST CONSULT NOTE   Requesting physician: Dr. Lurene ShadowSpongberg  Reason for Consult: Subacute encephalopathy  History obtained from: Chart  HPI:                                                                                                                                         Tamara [name] is a 59 y.o. female with a past medical history significant for Type II DM, hypertension, CKD, seizure disorder (dx at last hospitalization 05Jul2021) who was brought to the behavioral health unit and subsequently admitted to [facility] on 06Aug2021 with a multiple-week progressive history of worsening altered mental status. Per the chart, Tamara [name]'s sister reports that she has had no witnessed falls or seizure activity. Additionally, Tamara [name] had not been taking her medications and had limited PO intake for the past "several weeks".   I spoke with Tamara [name]'s sister, Tamara [name], who noted that Tamara [name]'s health began declining following her last admission (Tamara [name] was last admitted 05Jul2021 after an unwitnessed fall, emesis and urinary incontinence; diagnosed with seizure and started on Keppra 500mg  BID). Tamara [name] said that her sister ate only applesauce and gatorade on occasion because they were the only things that did not upset her stomach. Tamara [name] did not endorse trouble walking in Tamara [name] specifically, but said Tamara [name] would primarily spend time in bed. Per Tamara [name], another family friend who would stop by to help care for Tamara [name] said that she wet the bed several times and would became incontinent when on her way to the bathroom with assistance.  Tamara [name] was brought to the ED by family on 19Jul2021 for GI distress, was treated and released with no mention of AMS.    Pertinent Medications Rocephin 1g/24h D5 Insulin   Pertinent Imaging 07Aug2021, MRI Brain w/o contrast: Hyperintensity in the posteromedial thalami bilaterally 06Aug2021: CT head w/o  contrast: No acute abnormality   Pertinent Labs GFR: 42 (08Aug); 28 (07Aug PM); 25 (07Aug AM) Creat: 1.56 (08Aug); 2.19 (07Aug PM); 2.40 (07Aug AM) 06Aug2021, Tox screen: none detected    Past Medical History:  Diagnosis Date  . Memory changes     History reviewed. No pertinent surgical history.  Family History  Problem Relation Age of Onset  . Hypertension Mother   . Hypertension Father     Social History:  reports that she has never smoked. She has never used smokeless tobacco. She reports previous alcohol use. She reports previous drug use.  No Known Allergies  MEDICATIONS:  Current Meds  Medication Sig  . Acetaminophen 500 MG capsule Take 1,000 mg by mouth 3 (three) times daily as needed for pain.  Marland Kitchen albuterol (PROVENTIL HFA;VENTOLIN HFA) 108 (90 BASE) MCG/ACT inhaler Inhale 2 puffs into the lungs every 6 (six) hours as needed for wheezing.  Marland Kitchen aspirin EC 81 MG tablet Take 81 mg by mouth daily.  Marland Kitchen buPROPion (WELLBUTRIN SR) 150 MG 12 hr tablet Take 150 mg by mouth daily.  . Fluticasone-Salmeterol (ADVAIR) 250-50 MCG/DOSE AEPB Inhale 1 puff into the lungs 2 (two) times daily.  Marland Kitchen gabapentin (NEURONTIN) 300 MG capsule Take 1 capsule (300 mg total) by mouth 3 (three) times daily.  Marland Kitchen LANTUS SOLOSTAR 100 UNIT/ML Solostar Pen Inject 20 Units into the skin 2 (two) times daily.  Marland Kitchen levETIRAcetam (KEPPRA) 500 MG tablet Take 1 tablet (500 mg total) by mouth 2 (two) times daily.  Marland Kitchen loratadine (CLARITIN) 10 MG tablet Take 1 tablet (10 mg total) by mouth daily.  Marland Kitchen losartan (COZAAR) 100 MG tablet Take 100 mg by mouth daily.  . metFORMIN (GLUCOPHAGE) 1000 MG tablet Take 1,000 mg by mouth 2 (two) times daily with a meal.     Review Of Systems:                                                                                                           Level V caveat due to  AMS  Blood pressure 100/62, pulse (!) 109, temperature 99.3 F (37.4 C), temperature source Axillary, resp. rate 18, height 5\' 7"  (1.702 m), weight 54.1 kg, SpO2 98 %.   Physical Examination:                                                                                                      General: Thin female. Appears calm and comfortable in bed HEENT:  Normocephalic, no lesions, without obvious abnormality.  Normal external eye and external ears. Lips dry. Cardiovascular: RRR  Pulmonary: Breathing comfortably on room air Abdomen: Soft Extremities: no edema Musculoskeletal: No deformity or swelling. Tone and bulk normal throughout Skin: warm and dry, no hyperpigmentation, vitiligo, or suspicious lesions  Neurological Examination:  Mental Status: Tamara [name] is difficult to rouse and only oriented to self. She does not know where she is. Speech is without dysarthria. Unable to follow commands on my visit. Cranial Nerves: II: Pupils are equal, round, reactive to light; left eye slightly sluggish compared to right III,IV, VI: She has minimal abduction in her left eye when trying to look left, but otherwise does not move her eyes, doll's eye absent. V,VII: Face with slight reduction in nasolabial fold on the left VIII: Hearing grossly intact Motor: She will move upper extremities antigravity to noxious stimuli will move antigravity Sensory: Moves upper limbs to noxious stimuli; Verbally recognizes noxious stimuli to lower extremities. Deep Tendon Reflexes: Trace in upper extremities, absent in lowers Plantars: Right: downgoing   Left: downgoing Cerebellar: Unable to assess Proprioception: Unable to assess Gait: Did not assess   Lab Results: Basic Metabolic Panel: Recent Labs  Lab 04/02/20 1514 04/02/20 1514 04/03/20 0918 04/03/20 1935 04/04/20 0545  NA 153*  --  151* 154* 151*  K  3.4*  --  3.0* 4.2 2.8*  CL 111  --  116* 122* 115*  CO2 18*  --  22 12* 18*  GLUCOSE 293*  --  243* 146* 149*  BUN 141*  --  84* 78* 61*  CREATININE 4.88*  --  2.40* 2.19* 1.56*  CALCIUM 10.7*   < > 9.8 9.0 8.9   < > = values in this interval not displayed.    Liver Function Tests: Recent Labs  Lab 04/02/20 1514 04/03/20 0918  AST 24 27  ALT 25 23  ALKPHOS 76 72  BILITOT 1.0 0.7  PROT 8.5* 8.1  ALBUMIN 4.3 4.1   No results for input(s): LIPASE, AMYLASE in the last 168 hours. Recent Labs  Lab 04/02/20 1515  AMMONIA 24    CBC: Recent Labs  Lab 04/02/20 1514 04/03/20 0918 04/04/20 0545  WBC 15.9* 13.8* 10.5  NEUTROABS 13.0*  --  7.4  HGB 13.9 13.7 11.6*  HCT 44.6 44.1 37.1  MCV 91.4 90.9 91.6  PLT 445* 403* 308    Cardiac Enzymes: No results for input(s): CKTOTAL, CKMB, CKMBINDEX, TROPONINI in the last 168 hours.  Lipid Panel: No results for input(s): CHOL, TRIG, HDL, CHOLHDL, VLDL, LDLCALC in the last 168 hours.  CBG: Recent Labs  Lab 04/03/20 0631 04/03/20 1127 04/03/20 1626 04/03/20 2109 04/04/20 0616  GLUCAP 261* 192* 108* 116* 140*    Microbiology: Results for orders placed or performed during the hospital encounter of 04/02/20  Blood culture (routine x 2)     Status: None (Preliminary result)   Collection Time: 04/02/20  5:17 PM   Specimen: BLOOD RIGHT FOREARM  Result Value Ref Range Status   Specimen Description BLOOD RIGHT FOREARM  Final   Special Requests   Final    BOTTLES DRAWN AEROBIC AND ANAEROBIC Blood Culture results may not be optimal due to an inadequate volume of blood received in culture bottles   Culture   Final    NO GROWTH < 24 HOURS Performed at [facility], [address], [place], [place] [address]    Report Status PENDING  Incomplete  Blood culture (routine x 2)     Status: None (Preliminary result)   Collection Time: 04/02/20  5:19 PM   Specimen: BLOOD RIGHT WRIST  Result Value Ref Range Status   Specimen  Description BLOOD RIGHT WRIST  Final   Special Requests   Final    BOTTLES DRAWN AEROBIC ONLY  Blood Culture results may not be optimal due to an inadequate volume of blood received in culture bottles   Culture   Final    NO GROWTH < 24 HOURS Performed at [facility], [address], [place], [place] [address]    Report Status PENDING  Incomplete  SARS Coronavirus 2 by RT PCR (hospital order, performed in [facility] hospital lab) Nasopharyngeal Nasopharyngeal Swab     Status: None   Collection Time: 04/02/20  6:06 PM   Specimen: Nasopharyngeal Swab  Result Value Ref Range Status   SARS Coronavirus 2 NEGATIVE NEGATIVE Final    Comment: (NOTE) SARS-CoV-2 target nucleic acids are NOT DETECTED.  The SARS-CoV-2 RNA is generally detectable in upper and lower respiratory specimens during the acute phase of infection. The lowest concentration of SARS-CoV-2 viral copies this assay can detect is 250 copies / mL. A negative result does not preclude SARS-CoV-2 infection and should not be used as the sole basis for treatment or other patient management decisions.  A negative result may occur with improper specimen collection / handling, submission of specimen other than nasopharyngeal swab, presence of viral mutation(s) within the areas targeted by this assay, and inadequate number of viral copies (<250 copies / mL). A negative result must be combined with clinical observations, patient history, and epidemiological information.  Fact Sheet for Patients:   BoilerBrush.com.cy  Fact Sheet for Healthcare Providers: https://pope.com/  This test is not yet approved or  cleared by the [place] FDA and has been authorized for detection and/or diagnosis of SARS-CoV-2 by FDA under an Emergency Use Authorization (EUA).  This EUA will remain in effect (meaning this test can be used) for the duration of the COVID-19 declaration under Section  564(b)(1) of the Act, 21 U.S.C. section 360bbb-3(b)(1), unless the authorization is terminated or revoked sooner.  Performed at [facility], [address] [place], [place] [address]   Urine culture     Status: Abnormal   Collection Time: 04/02/20  7:18 PM   Specimen: Urine, Random  Result Value Ref Range Status   Specimen Description URINE, RANDOM  Final   Special Requests   Final    NONE Performed at [facility], [address] [place], [place] [address]    Culture MULTIPLE SPECIES PRESENT, SUGGEST RECOLLECTION (A)  Final   Report Status 04/04/2020 FINAL  Final    Coagulation Studies: Recent Labs    04/03/20 0918  LABPROT 15.9*  INR 1.3*    Imaging: CT ABDOMEN PELVIS WO CONTRAST  Result Date: 04/02/2020 CLINICAL DATA:  Acute abdominal pain.  Decreased responsiveness. EXAM: CT ABDOMEN AND PELVIS WITHOUT CONTRAST TECHNIQUE: Multidetector CT imaging of the abdomen and pelvis was performed following the standard protocol without IV contrast. COMPARISON:  03/16/2020.  03/01/2020 FINDINGS: Lower chest: The lung bases are clear. Hepatobiliary: The gallbladder is moderately distended. No stones or wall thickening. No bile duct dilatation. Unenhanced appearance of the liver is unremarkable. Pancreas: Unremarkable. No pancreatic ductal dilatation or surrounding inflammatory changes. Spleen: Normal in size without focal abnormality. Adrenals/Urinary Tract: Adrenal glands are unremarkable. Kidneys are normal, without renal calculi, focal lesion, or hydronephrosis. Bladder is unremarkable. Stomach/Bowel: Stomach, small bowel, and colon are not abnormally distended. No wall thickening or inflammatory changes are demonstrated. There is persistent finding of residual contrast material demonstrated throughout the colon and rectum. Streak artifact arising from contrast material limits examination. The appendix is normal. Vascular/Lymphatic: Aortic atherosclerosis. No enlarged abdominal  or pelvic  lymph nodes. Reproductive: Uterus is normal in size. Scattered uterine calcifications consistent with fibroids. No abnormal adnexal masses. Other: No abdominal wall hernia or abnormality. No abdominopelvic ascites. Musculoskeletal: No acute or significant osseous findings. IMPRESSION: 1. No acute process demonstrated in the abdomen or pelvis on noncontrast imaging. No evidence of bowel obstruction or inflammation. Residual contrast material demonstrated throughout the colon. 2. Aortic atherosclerosis. 3. Uterine fibroids. Aortic Atherosclerosis (ICD10-I70.0). Electronically Signed   By: Burman Nieves M.D.   On: 04/02/2020 19:13   CT Head Wo Contrast  Result Date: 04/02/2020 CLINICAL DATA:  Decreased responsiveness. Mental status change of unknown cause. EXAM: CT HEAD WITHOUT CONTRAST TECHNIQUE: Contiguous axial images were obtained from the base of the skull through the vertex without intravenous contrast. COMPARISON:  CT 02/29/2020.  MRI 03/01/2020 FINDINGS: Brain: Diffuse cerebral atrophy. Ventricular dilatation consistent with central atrophy. Low-attenuation changes in the deep white matter consistent with small vessel ischemia. No abnormal extra-axial fluid collections. No mass effect or midline shift. Gray-white matter junctions are distinct. Basal cisterns are not effaced. No acute intracranial hemorrhage. Vascular: Intracranial arterial calcifications. Skull: Calvarium appears intact. Sinuses/Orbits: Paranasal sinuses and mastoid air cells are clear. Other: None. IMPRESSION: 1. No acute intracranial abnormalities. 2. Chronic atrophy and small vessel ischemia. Electronically Signed   By: Burman Nieves M.D.   On: 04/02/2020 19:16   MR BRAIN WO CONTRAST  Result Date: 04/03/2020 CLINICAL DATA:  Delirium EXAM: MRI HEAD WITHOUT CONTRAST TECHNIQUE: Multiplanar, multiecho pulse sequences of the brain and surrounding structures were obtained without intravenous contrast. COMPARISON:  CT head  04/02/2020.  MRI head 03/01/2020 FINDINGS: Brain: New areas of diffusion signal in the posteromedial thalamus bilaterally. This is symmetric. No restricted diffusion but rather facilitated diffusion in these areas. No other abnormality on diffusion-weighted imaging. Normal midbrain. Pituitary normal in size. Generalized atrophy most prominent in the temporal lobes especially on the left. Hippocampal atrophy bilaterally. Negative for hemorrhage or mass. Vascular: Normal arterial flow voids. Skull and upper cervical spine: Negative Sinuses/Orbits: Negative Other: None IMPRESSION: 1. Interval development of diffusion hyperintensity in the posteromedial thalamus bilaterally without restricted diffusion. This could be subacute infarction particularly with artery of Percheron anatomy. Wernicke's encephalopathy also could give this appearance however recent B12 levels are normal. 2. Generalized atrophy most prominent in the left temporal lobe. Electronically Signed   By: Marlan Palau M.D.   On: 04/03/2020 18:00   DG Chest Portable 1 View  Result Date: 04/02/2020 CLINICAL DATA:  Tachycardia EXAM: PORTABLE CHEST 1 VIEW COMPARISON:  02/15/2020 FINDINGS: The heart size and mediastinal contours are within normal limits. Both lungs are clear. The visualized skeletal structures are unremarkable. Residual contrast material in the visualized colon. IMPRESSION: No active disease. Electronically Signed   By: Burman Nieves M.D.   On: 04/02/2020 20:07    Thank you for consulting the [facility] team. Dr. Amada Jupiter to follow.  Bruna Potter PA-C [facility]  04/04/2020, 8:51 AM I have seen the patient reviewed the above note.  I know the patient from her previous admission at which time she had an episode highly concerning for seizure, though never definitively proven.  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  She apparently has not been taking any of her outpatient meds.  Assessment  Ms. Duyen Hakimi is a 59 year old female with subacute cognitive decline.  When she was admitted 1 month ago with seizures, her description of  memory difficulties in the preceding months was considered to possibly be due to frequent seizures and therefore the plan was for her to follow-up with outpatient neurology after starting antiepileptic medication.  Though she has been noncompliant, her current presentation I do think merits reexamination.  Her combination of decreased walking, encephalopathy, and ophthalmoplegia in the setting of reduced p.o. intake is quite concerning for Wernicke's encephalopathy, but this would not explain her sudden loss of consciousness previously.  With the previous concern for seizure, and a scenario like this, I do think that an autoimmune process would need to be at least considered.  I would favor lumbar puncture to further evaluate.   Plan: - cEEG - ordered - Restart Keppra 500 BID (home dose) - Thiamine - ordered - LP for cells, glucose, protein, igg index, OC bands, culture, autoimmune encephalopathy panel to [facility]. - thyroid autoantibodies(for SRETA)  - Start high dose thiamine after level drawn,  TID x 3 days.   Ritta Slot, MD [facility] [phone]  If 7pm- 7am, please page neurology on call as listed in AMION.

## 2020-04-04 NOTE — Progress Notes (Signed)
PHARMACY - PHYSICIAN COMMUNICATION CRITICAL VALUE ALERT - BLOOD CULTURE IDENTIFICATION (BCID)  Tamara [name] is an 59 y.o. female who presented to [facility] on 04/02/2020 with a chief complaint of unresponsiveness/AMS where diagnosed w/ seizure disorder and started on anticonvulsant  Assessment:  Patient was started on ceftriaxone to rule out UTI. Confirmed with lab that 1 out of 3 bottles positive for Staph epi. This is usually likely to be a contaminant.   Name of physician (or Provider) Contacted: Dr. Lurene Shadow  Current antibiotics: ceftriaxone 1g IV q24h  Changes to prescribed antibiotics recommended: No change needed.   Results for orders placed or performed during the hospital encounter of 04/02/20  Blood Culture ID Panel (Reflexed) (Collected: 04/02/2020  5:17 PM)  Result Value Ref Range   Enterococcus faecalis NOT DETECTED NOT DETECTED   Enterococcus Faecium NOT DETECTED NOT DETECTED   Listeria monocytogenes NOT DETECTED NOT DETECTED   Staphylococcus species DETECTED (A) NOT DETECTED   Staphylococcus aureus (BCID) NOT DETECTED NOT DETECTED   Staphylococcus epidermidis DETECTED (A) NOT DETECTED   Staphylococcus lugdunensis NOT DETECTED NOT DETECTED   Streptococcus species NOT DETECTED NOT DETECTED   Streptococcus agalactiae NOT DETECTED NOT DETECTED   Streptococcus pneumoniae NOT DETECTED NOT DETECTED   Streptococcus pyogenes NOT DETECTED NOT DETECTED   A.calcoaceticus-baumannii NOT DETECTED NOT DETECTED   Bacteroides fragilis NOT DETECTED NOT DETECTED   Enterobacterales NOT DETECTED NOT DETECTED   Enterobacter cloacae complex NOT DETECTED NOT DETECTED   Escherichia coli NOT DETECTED NOT DETECTED   Klebsiella aerogenes NOT DETECTED NOT DETECTED   Klebsiella oxytoca NOT DETECTED NOT DETECTED   Klebsiella pneumoniae NOT DETECTED NOT DETECTED   Proteus species NOT DETECTED NOT DETECTED   Salmonella species NOT DETECTED NOT DETECTED   Serratia marcescens NOT DETECTED NOT  DETECTED   Haemophilus influenzae NOT DETECTED NOT DETECTED   Neisseria meningitidis NOT DETECTED NOT DETECTED   Pseudomonas aeruginosa NOT DETECTED NOT DETECTED   Stenotrophomonas maltophilia NOT DETECTED NOT DETECTED   Candida albicans NOT DETECTED NOT DETECTED   Candida auris NOT DETECTED NOT DETECTED   Candida glabrata NOT DETECTED NOT DETECTED   Candida krusei NOT DETECTED NOT DETECTED   Candida parapsilosis NOT DETECTED NOT DETECTED   Candida tropicalis NOT DETECTED NOT DETECTED   Cryptococcus neoformans/gattii NOT DETECTED NOT DETECTED   Methicillin resistance mecA/C NOT DETECTED NOT DETECTED    Pervis Hocking, PharmD PGY1 Pharmacy Resident 04/04/2020 1:40 PM  Please check [email or web address] for unit-specific pharmacy phone numbers.

## 2020-04-04 NOTE — Progress Notes (Addendum)
PROGRESS NOTE    Tamara [name]  WUJ:[id number] DOB: 12/28/1960 DOA: 04/02/2020 PCP: [name], NP   Brief Narrative:  [id number] Tamara [name] a 59 y.o.femalewith medical history significant fordm, htn, ckd, asthma, recent hospitalization for unresponsiveness/AMS where diagnosed w/ seizure disorder and started on anticonvulsant, presenting with above.  History obtained from patient's sister as pt not answering questions.  Sister reports several weeks of worsening mental status. Frequently not making sense, confused. Few days of not moving much. Has not been taking meds and rarely eating/drinking. No witnessed falls or seizure like activity. No reported fevers. Sister does say pt has had intermittent nbnb emesis. No diarrhea. No complaints of dysuria. No report of toxic habits or unintentional overdose.  ED Course:Labs, imaging, 3 L NS   Assessment & Plan:   Principal Problem:   AKI (acute kidney injury) (HCC) Active Problems:   Asthma with acute exacerbation   Diabetes (HCC)   Acute encephalopathy   Acute metabolic encephalopathy   Acute toxic metabolic encephalopathy: - multifactorial, profound dehyrdtaion with AKI, AND ELECTROLYTE ABNL -consulted neuro 8/8 AM 2/2 MRI abnls - Change fluids to D5 water - bmp this eve 1900 - B12 NL, - MRI brain completed 8/7 pm - agree, given equivocal urinalysis and elevated wbcs, AMS low temp and inability to get accurate hx from patient will continue to cover for possible uti with ceftriaxone 1 g qd given renal function - monitor K, - tele  Bacteremia vs contaminant: C/w abx Repeat blood cx If pos , consider echo and follow s/s   AKI; -2/2 profound dehydration, CONTINUED significant improvmeent,recheck in AM  -neph consult if doesn't continue to improve -bmp tonight and am   Hypernatremia - 2/2 dehydration - s/p 3 L NS as above, fluids now D5W at 125 - repeat bmp tonight and in AM   Hyperglycemia  - admitter  considered possible DKA as above - home lantus; SSI  Seizure disorder - see above - f/u keppra level, per sister has been off meds for several weeks - keppra 250 qd, renally dosed -neuro following, EEG - hold home gabapentin  HTN here bp wnl - hold home losartan, atorvastatin, aspirin  Asthma - at baseline, it appears - home dulera, singulair, loratadine  DVT prophylaxis: Heparin SQ  Code Status: Full    Code Status Orders  (From admission, onward)         Start     Ordered   04/02/20 2021  Full code  Continuous        04/02/20 2020        Code Status History    Date Active Date Inactive Code Status Order ID Comments User Context   03/01/2020 0034 03/04/2020 1623 Full Code [id number]  Frankey Shown, DO ED   11/03/2018 1841 11/04/2018 1950 Full Code [id number]  Dollene Cleveland, DO Inpatient   Advance Care Planning Activity     Family Communication: called sister Ms Valentino Saxon, Disposition Plan:   Status is: Inpatient  Remains inpatient appropriate because:Ongoing diagnostic testing needed not appropriate for outpatient work up, Unsafe d/c plan and Inpatient level of care appropriate due to severity of illness   Dispo: The patient is from: Home              Anticipated d/c is to: SNF              Anticipated d/c date is: > 3 days              Patient currently  is not medically stable to d/c.       Consults called: None Admission status: Inpatient   Consultants:   neurology  Procedures:  CT ABDOMEN PELVIS WO CONTRAST  Result Date: 04/02/2020 CLINICAL DATA:  Acute abdominal pain.  Decreased responsiveness. EXAM: CT ABDOMEN AND PELVIS WITHOUT CONTRAST TECHNIQUE: Multidetector CT imaging of the abdomen and pelvis was performed following the standard protocol without IV contrast. COMPARISON:  03/16/2020.  03/01/2020 FINDINGS: Lower chest: The lung bases are clear. Hepatobiliary: The gallbladder is moderately distended. No stones or wall thickening. No bile duct  dilatation. Unenhanced appearance of the liver is unremarkable. Pancreas: Unremarkable. No pancreatic ductal dilatation or surrounding inflammatory changes. Spleen: Normal in size without focal abnormality. Adrenals/Urinary Tract: Adrenal glands are unremarkable. Kidneys are normal, without renal calculi, focal lesion, or hydronephrosis. Bladder is unremarkable. Stomach/Bowel: Stomach, small bowel, and colon are not abnormally distended. No wall thickening or inflammatory changes are demonstrated. There is persistent finding of residual contrast material demonstrated throughout the colon and rectum. Streak artifact arising from contrast material limits examination. The appendix is normal. Vascular/Lymphatic: Aortic atherosclerosis. No enlarged abdominal or pelvic lymph nodes. Reproductive: Uterus is normal in size. Scattered uterine calcifications consistent with fibroids. No abnormal adnexal masses. Other: No abdominal wall hernia or abnormality. No abdominopelvic ascites. Musculoskeletal: No acute or significant osseous findings. IMPRESSION: 1. No acute process demonstrated in the abdomen or pelvis on noncontrast imaging. No evidence of bowel obstruction or inflammation. Residual contrast material demonstrated throughout the colon. 2. Aortic atherosclerosis. 3. Uterine fibroids. Aortic Atherosclerosis (ICD10-I70.0). Electronically Signed   By: Burman Nieves M.D.   On: 04/02/2020 19:13   CT Head Wo Contrast  Result Date: 04/02/2020 CLINICAL DATA:  Decreased responsiveness. Mental status change of unknown cause. EXAM: CT HEAD WITHOUT CONTRAST TECHNIQUE: Contiguous axial images were obtained from the base of the skull through the vertex without intravenous contrast. COMPARISON:  CT 02/29/2020.  MRI 03/01/2020 FINDINGS: Brain: Diffuse cerebral atrophy. Ventricular dilatation consistent with central atrophy. Low-attenuation changes in the deep white matter consistent with small vessel ischemia. No abnormal  extra-axial fluid collections. No mass effect or midline shift. Gray-white matter junctions are distinct. Basal cisterns are not effaced. No acute intracranial hemorrhage. Vascular: Intracranial arterial calcifications. Skull: Calvarium appears intact. Sinuses/Orbits: Paranasal sinuses and mastoid air cells are clear. Other: None. IMPRESSION: 1. No acute intracranial abnormalities. 2. Chronic atrophy and small vessel ischemia. Electronically Signed   By: Burman Nieves M.D.   On: 04/02/2020 19:16   MR BRAIN WO CONTRAST  Result Date: 04/03/2020 CLINICAL DATA:  Delirium EXAM: MRI HEAD WITHOUT CONTRAST TECHNIQUE: Multiplanar, multiecho pulse sequences of the brain and surrounding structures were obtained without intravenous contrast. COMPARISON:  CT head 04/02/2020.  MRI head 03/01/2020 FINDINGS: Brain: New areas of diffusion signal in the posteromedial thalamus bilaterally. This is symmetric. No restricted diffusion but rather facilitated diffusion in these areas. No other abnormality on diffusion-weighted imaging. Normal midbrain. Pituitary normal in size. Generalized atrophy most prominent in the temporal lobes especially on the left. Hippocampal atrophy bilaterally. Negative for hemorrhage or mass. Vascular: Normal arterial flow voids. Skull and upper cervical spine: Negative Sinuses/Orbits: Negative Other: None IMPRESSION: 1. Interval development of diffusion hyperintensity in the posteromedial thalamus bilaterally without restricted diffusion. This could be subacute infarction particularly with artery of Percheron anatomy. Wernicke's encephalopathy also could give this appearance however recent B12 levels are normal. 2. Generalized atrophy most prominent in the left temporal lobe. Electronically Signed   By: Marlan Palau M.D.  On: 04/03/2020 18:00   CT ABDOMEN PELVIS W CONTRAST  Result Date: 03/16/2020 CLINICAL DATA:  Epigastric pain. EXAM: CT ABDOMEN AND PELVIS WITH CONTRAST TECHNIQUE: Multidetector  CT imaging of the abdomen and pelvis was performed using the standard protocol following bolus administration of intravenous contrast. CONTRAST:  87mL OMNIPAQUE IOHEXOL 300 MG/ML  SOLN COMPARISON:  March 01, 2020 FINDINGS: Lower chest: No acute abnormality. Hepatobiliary: No focal liver abnormality is seen. No gallstones, gallbladder wall thickening, or biliary dilatation. Pancreas: Unremarkable. No pancreatic ductal dilatation or surrounding inflammatory changes. Spleen: Normal in size without focal abnormality. Adrenals/Urinary Tract: Adrenal glands are unremarkable. Kidneys are normal, without renal calculi, focal lesion, or hydronephrosis. Bladder is unremarkable. Stomach/Bowel: Stomach is within normal limits. Appendix appears normal. A large amount of barium is seen throughout the large bowel. No evidence of bowel wall thickening, distention, or inflammatory changes. Vascular/Lymphatic: There is mild calcification of the abdominal aorta. No enlarged abdominal or pelvic lymph nodes. Reproductive: Partially calcified and noncalcified heterogeneous uterine fibroids are seen. The largest measures approximately 2.1 cm x 1.6 cm. Other: No abdominal wall hernia or abnormality. No abdominopelvic ascites. Musculoskeletal: No acute or significant osseous findings. IMPRESSION: 1. Large amount of barium throughout the large bowel with associated streak artifact. 2. Partially calcified and noncalcified heterogeneous uterine fibroids. 3. Aortic atherosclerosis. Aortic Atherosclerosis (ICD10-I70.0). Electronically Signed   By: Aram Candela M.D.   On: 03/16/2020 03:27   DG Chest Portable 1 View  Result Date: 04/02/2020 CLINICAL DATA:  Tachycardia EXAM: PORTABLE CHEST 1 VIEW COMPARISON:  02/15/2020 FINDINGS: The heart size and mediastinal contours are within normal limits. Both lungs are clear. The visualized skeletal structures are unremarkable. Residual contrast material in the visualized colon. IMPRESSION: No active  disease. Electronically Signed   By: Burman Nieves M.D.   On: 04/02/2020 20:07   DG Foot Complete Left  Result Date: 03/16/2020 CLINICAL DATA:  First digit pain EXAM: LEFT FOOT - COMPLETE 3+ VIEW COMPARISON:  None. FINDINGS: There is no evidence of fracture or dislocation. There is no evidence of arthropathy or other focal bone abnormality. Soft tissues are unremarkable. IMPRESSION: Negative. Electronically Signed   By: Katherine Mantle M.D.   On: 03/16/2020 01:12     Antimicrobials:   ceftriaxone   Subjective: Patient is still significantly altered although did respond to questions.  Objective: Vitals:   04/04/20 0542 04/04/20 0809 04/04/20 0858 04/04/20 1157  BP:  100/62 92/62 113/79  Pulse:  (!) 109 97 (!) 102  Resp:  18 19 18   Temp: 97.6 F (36.4 C) 99.3 F (37.4 C) 99 F (37.2 C) 99.2 F (37.3 C)  TempSrc: Axillary Axillary Axillary Axillary  SpO2:  98% 99% 100%  Weight:      Height:        Intake/Output Summary (Last 24 hours) at 04/04/2020 1253 Last data filed at 04/04/2020 0400 Gross per 24 hour  Intake 3215.18 ml  Output 1176 ml  Net 2039.18 ml   Filed Weights   04/02/20 2318  Weight: 54.1 kg    Examination:  General exam: confused min responsive although more than yesterday Respiratory system: Clear to auscultation. Respiratory effort normal. Cardiovascular system: S1 & S2 heard, RRR. No JVD, murmurs, rubs, gallops or clicks. No pedal edema. Gastrointestinal system: Abdomen is nondistended, soft and nontender. No organomegaly or masses felt. Normal bowel sounds heard. Central nervous system: awake, moves all 4 ext. Extremities: wwp, nv intact Skin: No rashes, lesions or ulcers Psychiatry: Judgement and insight impaired, flat  affect    Data Reviewed: I have personally reviewed following labs and imaging studies  CBC: Recent Labs  Lab 04/02/20 1514 04/03/20 0918 04/04/20 0545  WBC 15.9* 13.8* 10.5  NEUTROABS 13.0*  --  7.4  HGB 13.9 13.7  11.6*  HCT 44.6 44.1 37.1  MCV 91.4 90.9 91.6  PLT 445* 403* 308   Basic Metabolic Panel: Recent Labs  Lab 04/02/20 1514 04/03/20 0918 04/03/20 1935 04/04/20 0545 04/04/20 1110  NA 153* 151* 154* 151* 150*  K 3.4* 3.0* 4.2 2.8* 4.2  CL 111 116* 122* 115* 117*  CO2 18* 22 12* 18* 18*  GLUCOSE 293* 243* 146* 149* 155*  BUN 141* 84* 78* 61* 55*  CREATININE 4.88* 2.40* 2.19* 1.56* 1.75*  CALCIUM 10.7* 9.8 9.0 8.9 8.4*   GFR: Estimated Creatinine Clearance: 30.3 mL/min (A) (by C-G formula based on SCr of 1.75 mg/dL (H)). Liver Function Tests: Recent Labs  Lab 04/02/20 1514 04/03/20 0918  AST 24 27  ALT 25 23  ALKPHOS 76 72  BILITOT 1.0 0.7  PROT 8.5* 8.1  ALBUMIN 4.3 4.1   No results for input(s): LIPASE, AMYLASE in the last 168 hours. Recent Labs  Lab 04/02/20 1515  AMMONIA 24   Coagulation Profile: Recent Labs  Lab 04/03/20 0918  INR 1.3*   Cardiac Enzymes: No results for input(s): CKTOTAL, CKMB, CKMBINDEX, TROPONINI in the last 168 hours. BNP (last 3 results) No results for input(s): PROBNP in the last 8760 hours. HbA1C: No results for input(s): HGBA1C in the last 72 hours. CBG: Recent Labs  Lab 04/03/20 1127 04/03/20 1626 04/03/20 2109 04/04/20 0616 04/04/20 1156  GLUCAP 192* 108* 116* 140* 146*   Lipid Profile: No results for input(s): CHOL, HDL, LDLCALC, TRIG, CHOLHDL, LDLDIRECT in the last 72 hours. Thyroid Function Tests: Recent Labs    04/03/20 0918  TSH 1.518   Anemia Panel: Recent Labs    04/03/20 0918  VITAMINB12 1,034*   Sepsis Labs: Recent Labs  Lab 04/02/20 1515 04/02/20 1716 04/03/20 0918  LATICACIDVEN 4.5* 3.0* 3.4*    Recent Results (from the past 240 hour(s))  Blood culture (routine x 2)     Status: None (Preliminary result)   Collection Time: 04/02/20  5:17 PM   Specimen: BLOOD RIGHT FOREARM  Result Value Ref Range Status   Specimen Description BLOOD RIGHT FOREARM  Final   Special Requests   Final    BOTTLES  DRAWN AEROBIC AND ANAEROBIC Blood Culture results may not be optimal due to an inadequate volume of blood received in culture bottles   Culture  Setup Time   Final    GRAM POSITIVE COCCI IN CLUSTERS AEROBIC BOTTLE ONLY Organism ID to follow    Culture   Final    NO GROWTH 2 DAYS Performed at [facility], [address] [place], [place] [address]    Report Status PENDING  Incomplete  Blood culture (routine x 2)     Status: None (Preliminary result)   Collection Time: 04/02/20  5:19 PM   Specimen: BLOOD RIGHT WRIST  Result Value Ref Range Status   Specimen Description BLOOD RIGHT WRIST  Final   Special Requests   Final    BOTTLES DRAWN AEROBIC ONLY Blood Culture results may not be optimal due to an inadequate volume of blood received in culture bottles   Culture   Final    NO GROWTH 2 DAYS Performed at [facility], [address], [place], [place] [address]  Report Status PENDING  Incomplete  SARS Coronavirus 2 by RT PCR (hospital order, performed in [facility] hospital lab) Nasopharyngeal Nasopharyngeal Swab     Status: None   Collection Time: 04/02/20  6:06 PM   Specimen: Nasopharyngeal Swab  Result Value Ref Range Status   SARS Coronavirus 2 NEGATIVE NEGATIVE Final    Comment: (NOTE) SARS-CoV-2 target nucleic acids are NOT DETECTED.  The SARS-CoV-2 RNA is generally detectable in upper and lower respiratory specimens during the acute phase of infection. The lowest concentration of SARS-CoV-2 viral copies this assay can detect is 250 copies / mL. A negative result does not preclude SARS-CoV-2 infection and should not be used as the sole basis for treatment or other patient management decisions.  A negative result may occur with improper specimen collection / handling, submission of specimen other than nasopharyngeal swab, presence of viral mutation(s) within the areas targeted by this assay, and inadequate number of viral copies (<250 copies / mL). A negative  result must be combined with clinical observations, patient history, and epidemiological information.  Fact Sheet for Patients:   BoilerBrush.com.cy  Fact Sheet for Healthcare Providers: https://pope.com/  This test is not yet approved or  cleared by the [place] FDA and has been authorized for detection and/or diagnosis of SARS-CoV-2 by FDA under an Emergency Use Authorization (EUA).  This EUA will remain in effect (meaning this test can be used) for the duration of the COVID-19 declaration under Section 564(b)(1) of the Act, 21 U.S.C. section 360bbb-3(b)(1), unless the authorization is terminated or revoked sooner.  Performed at [facility], [address], [place], [place] [address]   Urine culture     Status: Abnormal   Collection Time: 04/02/20  7:18 PM   Specimen: Urine, Random  Result Value Ref Range Status   Specimen Description URINE, RANDOM  Final   Special Requests   Final    NONE Performed at [facility], [address], [place], [place] [address]    Culture MULTIPLE SPECIES PRESENT, SUGGEST RECOLLECTION (A)  Final   Report Status 04/04/2020 FINAL  Final         Radiology Studies: CT ABDOMEN PELVIS WO CONTRAST  Result Date: 04/02/2020 CLINICAL DATA:  Acute abdominal pain.  Decreased responsiveness. EXAM: CT ABDOMEN AND PELVIS WITHOUT CONTRAST TECHNIQUE: Multidetector CT imaging of the abdomen and pelvis was performed following the standard protocol without IV contrast. COMPARISON:  03/16/2020.  03/01/2020 FINDINGS: Lower chest: The lung bases are clear. Hepatobiliary: The gallbladder is moderately distended. No stones or wall thickening. No bile duct dilatation. Unenhanced appearance of the liver is unremarkable. Pancreas: Unremarkable. No pancreatic ductal dilatation or surrounding inflammatory changes. Spleen: Normal in size without focal abnormality. Adrenals/Urinary Tract: Adrenal glands are  unremarkable. Kidneys are normal, without renal calculi, focal lesion, or hydronephrosis. Bladder is unremarkable. Stomach/Bowel: Stomach, small bowel, and colon are not abnormally distended. No wall thickening or inflammatory changes are demonstrated. There is persistent finding of residual contrast material demonstrated throughout the colon and rectum. Streak artifact arising from contrast material limits examination. The appendix is normal. Vascular/Lymphatic: Aortic atherosclerosis. No enlarged abdominal or pelvic lymph nodes. Reproductive: Uterus is normal in size. Scattered uterine calcifications consistent with fibroids. No abnormal adnexal masses. Other: No abdominal wall hernia or abnormality. No abdominopelvic ascites. Musculoskeletal: No acute or significant osseous findings. IMPRESSION: 1. No acute process demonstrated in the abdomen or pelvis on noncontrast imaging. No evidence of bowel obstruction or inflammation. Residual contrast material demonstrated  throughout the colon. 2. Aortic atherosclerosis. 3. Uterine fibroids. Aortic Atherosclerosis (ICD10-I70.0). Electronically Signed   By: Burman NievesWilliam  Stevens M.D.   On: 04/02/2020 19:13   CT Head Wo Contrast  Result Date: 04/02/2020 CLINICAL DATA:  Decreased responsiveness. Mental status change of unknown cause. EXAM: CT HEAD WITHOUT CONTRAST TECHNIQUE: Contiguous axial images were obtained from the base of the skull through the vertex without intravenous contrast. COMPARISON:  CT 02/29/2020.  MRI 03/01/2020 FINDINGS: Brain: Diffuse cerebral atrophy. Ventricular dilatation consistent with central atrophy. Low-attenuation changes in the deep white matter consistent with small vessel ischemia. No abnormal extra-axial fluid collections. No mass effect or midline shift. Gray-white matter junctions are distinct. Basal cisterns are not effaced. No acute intracranial hemorrhage. Vascular: Intracranial arterial calcifications. Skull: Calvarium appears intact.  Sinuses/Orbits: Paranasal sinuses and mastoid air cells are clear. Other: None. IMPRESSION: 1. No acute intracranial abnormalities. 2. Chronic atrophy and small vessel ischemia. Electronically Signed   By: Burman NievesWilliam  Stevens M.D.   On: 04/02/2020 19:16   MR BRAIN WO CONTRAST  Result Date: 04/03/2020 CLINICAL DATA:  Delirium EXAM: MRI HEAD WITHOUT CONTRAST TECHNIQUE: Multiplanar, multiecho pulse sequences of the brain and surrounding structures were obtained without intravenous contrast. COMPARISON:  CT head 04/02/2020.  MRI head 03/01/2020 FINDINGS: Brain: New areas of diffusion signal in the posteromedial thalamus bilaterally. This is symmetric. No restricted diffusion but rather facilitated diffusion in these areas. No other abnormality on diffusion-weighted imaging. Normal midbrain. Pituitary normal in size. Generalized atrophy most prominent in the temporal lobes especially on the left. Hippocampal atrophy bilaterally. Negative for hemorrhage or mass. Vascular: Normal arterial flow voids. Skull and upper cervical spine: Negative Sinuses/Orbits: Negative Other: None IMPRESSION: 1. Interval development of diffusion hyperintensity in the posteromedial thalamus bilaterally without restricted diffusion. This could be subacute infarction particularly with artery of Percheron anatomy. Wernicke's encephalopathy also could give this appearance however recent B12 levels are normal. 2. Generalized atrophy most prominent in the left temporal lobe. Electronically Signed   By: Marlan Palauharles  Clark M.D.   On: 04/03/2020 18:00   DG Chest Portable 1 View  Result Date: 04/02/2020 CLINICAL DATA:  Tachycardia EXAM: PORTABLE CHEST 1 VIEW COMPARISON:  02/15/2020 FINDINGS: The heart size and mediastinal contours are within normal limits. Both lungs are clear. The visualized skeletal structures are unremarkable. Residual contrast material in the visualized colon. IMPRESSION: No active disease. Electronically Signed   By: Burman NievesWilliam  Stevens  M.D.   On: 04/02/2020 20:07        Scheduled Meds: . folic acid  1 mg Oral Daily  . heparin  5,000 Units Subcutaneous Q8H  . insulin aspart  0-15 Units Subcutaneous TID WC  . insulin glargine  10 Units Subcutaneous BID  . loratadine  10 mg Oral Daily  . mometasone-formoterol  2 puff Inhalation BID  . montelukast  10 mg Oral QHS  . polyethylene glycol  17 g Oral QHS   Continuous Infusions: . cefTRIAXone (ROCEPHIN)  IV 1 g (04/03/20 1901)  . dextrose 5 % with KCl 20 mEq / L 20 mEq (04/04/20 0947)  . levETIRAcetam 500 mg (04/04/20 1204)     LOS: 2 days    Time spent: 35 min    Burke Keelshristopher Lloyd Cullinan, MD [facility]  If 7PM-7AM, please contact night-coverage  04/04/2020, 12:53 PM

## 2020-04-04 NOTE — Progress Notes (Addendum)
LTM EEG hooked up and running - no initial skin breakdown - push button tested - neuro notified.  

## 2020-04-04 NOTE — Plan of Care (Signed)
  Problem: Education: Goal: Knowledge of General Education information will improve Description: Including pain rating scale, medication(s)/side effects and non-pharmacologic comfort measures Outcome: Progressing   Problem: Coping: Goal: Level of anxiety will decrease Outcome: Progressing   Problem: Elimination: Goal: Will not experience complications related to bowel motility Outcome: Progressing   Problem: Pain Managment: Goal: General experience of comfort will improve Outcome: Progressing   

## 2020-04-04 NOTE — Procedures (Signed)
History: 59 yo F being evaluated for AMS.   Sedation: None  Technique: This is a 21 channel routine scalp EEG performed at the bedside with bipolar and monopolar montages arranged in accordance to the international 10/20 system of electrode placement. One channel was dedicated to EKG recording.    Background: The background consists of a posterior dominant rhythm of 7 to 8 Hz with superimposed generalized irregular delta and theta range activities.  Sleep structures are seen and are symmetric in nature.  Photic stimulation: Physiologic driving is not performed  EEG Abnormalities: 1) generalized irregular slow activity 2) slow posterior dominant rhythm  Clinical Interpretation: This EEG is consistent with a generalized nonspecific cerebral dysfunction (encephalopathy).  There was no seizure or seizure predisposition recorded on this study. Please note that lack of epileptiform activity on EEG does not preclude the possibility of epilepsy.   Ritta Slot, MD [facility] [phone]  If 7pm- 7am, please page neurology on call as listed in AMION.

## 2020-04-04 NOTE — Progress Notes (Signed)
EEG complete - results pending 

## 2020-04-04 NOTE — Progress Notes (Signed)
Warming blanket removed at this time, axillary temp is 99.3. Will reassess in 15 minutes.

## 2020-04-04 NOTE — Progress Notes (Signed)
Rectal temp 95.8. Applied warming blanket on medium setting per Dr. Loney Loh.

## 2020-04-05 DIAGNOSIS — E1165 Type 2 diabetes mellitus with hyperglycemia: Secondary | ICD-10-CM

## 2020-04-05 DIAGNOSIS — E43 Unspecified severe protein-calorie malnutrition: Secondary | ICD-10-CM | POA: Insufficient documentation

## 2020-04-05 DIAGNOSIS — Z794 Long term (current) use of insulin: Secondary | ICD-10-CM

## 2020-04-05 DIAGNOSIS — R569 Unspecified convulsions: Secondary | ICD-10-CM

## 2020-04-05 LAB — GLUCOSE, CAPILLARY
Glucose-Capillary: 115 mg/dL — ABNORMAL HIGH (ref 70–99)
Glucose-Capillary: 141 mg/dL — ABNORMAL HIGH (ref 70–99)
Glucose-Capillary: 138 mg/dL — ABNORMAL HIGH (ref 70–99)
Glucose-Capillary: 105 mg/dL — ABNORMAL HIGH (ref 70–99)

## 2020-04-05 LAB — CBC WITH DIFFERENTIAL/PLATELET
Abs Immature Granulocytes: 0.08 10*3/uL — ABNORMAL HIGH (ref 0.00–0.07)
Basophils Absolute: 0 10*3/uL (ref 0.0–0.1)
Basophils Relative: 0 %
Eosinophils Absolute: 0.1 10*3/uL (ref 0.0–0.5)
Eosinophils Relative: 1 %
HCT: 32.2 % — ABNORMAL LOW (ref 36.0–46.0)
Hemoglobin: 9.9 g/dL — ABNORMAL LOW (ref 12.0–15.0)
Immature Granulocytes: 1 %
Lymphocytes Relative: 33 %
Lymphs Abs: 3.8 10*3/uL (ref 0.7–4.0)
MCH: 28.7 pg (ref 26.0–34.0)
MCHC: 30.7 g/dL (ref 30.0–36.0)
MCV: 93.3 fL (ref 80.0–100.0)
Monocytes Absolute: 0.8 10*3/uL (ref 0.1–1.0)
Monocytes Relative: 7 %
Neutro Abs: 6.7 10*3/uL (ref 1.7–7.7)
Neutrophils Relative %: 58 %
Platelets: 267 10*3/uL (ref 150–400)
RBC: 3.45 MIL/uL — ABNORMAL LOW (ref 3.87–5.11)
RDW: 14 % (ref 11.5–15.5)
WBC: 11.5 10*3/uL — ABNORMAL HIGH (ref 4.0–10.5)
nRBC: 0 % (ref 0.0–0.2)

## 2020-04-05 LAB — THYROGLOBULIN ANTIBODY: Thyroglobulin Antibody: <1 IU/mL (ref 0.0–0.9)

## 2020-04-05 LAB — BASIC METABOLIC PANEL
Anion gap: 11 (ref 5–15)
BUN: 40 mg/dL — ABNORMAL HIGH (ref 6–20)
CO2: 23 mmol/L (ref 22–32)
Calcium: 8.2 mg/dL — ABNORMAL LOW (ref 8.9–10.3)
Chloride: 111 mmol/L (ref 98–111)
Creatinine, Ser: 1.62 mg/dL — ABNORMAL HIGH (ref 0.44–1.00)
GFR calc Af Amer: 40 mL/min — ABNORMAL LOW (ref >60)
GFR calc non Af Amer: 35 mL/min — ABNORMAL LOW (ref >60)
Glucose, Bld: 133 mg/dL — ABNORMAL HIGH (ref 70–99)
Potassium: 3.7 mmol/L (ref 3.5–5.1)
Sodium: 145 mmol/L (ref 135–145)

## 2020-04-05 LAB — CULTURE, BLOOD (ROUTINE X 2)

## 2020-04-05 LAB — THYROID PEROXIDASE ANTIBODY: Thyroperoxidase Ab SerPl-aCnc: 16 IU/mL (ref 0–34)

## 2020-04-05 MED ORDER — BOOST / RESOURCE BREEZE PO LIQD CUSTOM
1.0000 | Freq: Three times a day (TID) | ORAL | Status: DC
  Administered 2020-04-05 – 2020-04-10 (×6): 1 via ORAL

## 2020-04-05 MED ORDER — ADULT MULTIVITAMIN W/MINERALS CH
1.0000 | ORAL_TABLET | Freq: Every day | ORAL | Status: DC
  Filled 2020-04-05: qty 1

## 2020-04-05 MED ORDER — POTASSIUM CL IN DEXTROSE 5% 20 MEQ/L IV SOLN
20.0000 meq | INTRAVENOUS | Status: DC
  Administered 2020-04-05 – 2020-04-07 (×4): 20 meq via INTRAVENOUS
  Filled 2020-04-05 (×4): qty 1000

## 2020-04-05 NOTE — Progress Notes (Signed)
Initial Nutrition Assessment  DOCUMENTATION CODES:   Severe malnutrition in context of acute illness/injury  INTERVENTION:  Boost Breeze po TID, each supplement provides 250 kcal and 9 grams of protein  MVI daily  NUTRITION DIAGNOSIS:   Severe Malnutrition related to acute illness as evidenced by energy intake < or equal to 50% for > or equal to 5 days, moderate fat depletion, moderate muscle depletion, percent weight loss.    GOAL:   Patient will meet greater than or equal to 90% of their needs    MONITOR:   PO intake, Supplement acceptance, Diet advancement, Weight trends, Labs, I & O's  REASON FOR ASSESSMENT:   Consult Assessment of nutrition requirement/status  ASSESSMENT:   Pt presented with acute toxic metabolic encephalopathy. PMH includes DM, HTN, CKD, asthma, recent hospitalization for unresponsiveness/[facility] where pt was diagnosed with seizure disorder.   Pt very lethargic upon examination and unable to provide answers to RD questions at this time. Discussed pt with RN. Per RN, pt's daughter is the primary decision maker for the pt but the daughter is currently deployed with the [employer]. Attempts are being made to reach the pt's daughter so that GOC can be addressed. Per H&P, pt has had limited po intake PTA, though duration of this is unclear... Pt documented to have had poor po intake for several days PTA and for several weeks PTA. Unable to verify with pt or family at this time. Per RN, pt has been refusing meals and many medications. RD will order oral nutrition supplements in hopes of increasing kcal/protein intake; however, pt will likely need initiation of nutrition support if aligned with GOC.   Per wt readings, pt weighed 69.1kg on 03/15/20. Pt now weighs 54.1 kg. This would indicate a 21.5% wt loss x <1 month, which is significant for time frame.   Labs: CBGs 115-153 Medications: folvite, novolog, lantus, miralax, thiamine   NUTRITION - FOCUSED PHYSICAL  EXAM:    Most Recent Value  Orbital Region Mild depletion  Upper Arm Region Severe depletion  Thoracic and Lumbar Region Mild depletion  Buccal Region Mild depletion  Temple Region Mild depletion  Clavicle Bone Region Moderate depletion  Clavicle and Acromion Bone Region Moderate depletion  Scapular Bone Region Mild depletion  Dorsal Hand Mild depletion  Patellar Region Moderate depletion  Anterior Thigh Region Moderate depletion  Posterior Calf Region Moderate depletion  Edema (RD Assessment) None  Hair Reviewed  Eyes Reviewed  Mouth Reviewed  Skin Reviewed  Nails Reviewed       Diet Order:   Diet Order            Diet clear liquid Room service appropriate? Yes; Fluid consistency: Thin  Diet effective now                 EDUCATION NEEDS:   Not appropriate for education at this time  Skin:  Skin Assessment: Reviewed RN Assessment  Last BM:  8/9 type 5  Height:   Ht Readings from Last 1 Encounters:  04/02/20 5\' 7"  (1.702 m)    Weight:   Wt Readings from Last 3 Encounters:  04/02/20 54.1 kg  03/15/20 69.1 kg  03/01/20 69.1 kg    BMI:  Body mass index is 18.68 kg/m.  Estimated Nutritional Needs:   Kcal:  1750-1950  Protein:  90-105 grams  Fluid:  >/=1.75L/d    05/02/20, MS, RD, LDN RD pager number and weekend/on-call pager number located in Amion.

## 2020-04-05 NOTE — Procedures (Addendum)
Patient Name: Tamara [name]  MRN: [id number]  Epilepsy Attending: Charlsie Quest  Referring Physician/Provider: Dr. Onalee Hua Duration: 04/04/2020 1503 2 04/05/2020 1035  Patient history: 59 year old female being evaluated for altered mental status.  EEG to assess for seizures.  Level of alertness: Awake, asleep  AEDs during EEG study: Keppra  Technical aspects: This EEG study was done with scalp electrodes positioned according to the 10-20 International system of electrode placement. Electrical activity was acquired at a sampling rate of 500Hz  and reviewed with a high frequency filter of 70Hz  and a low frequency filter of 1Hz . EEG data were recorded continuously and digitally stored.   Description: The posterior dominant rhythm consists of 8 Hz activity of moderate voltage (25-35 uV) seen predominantly in posterior head regions, symmetric and reactive to eye opening and eye closingSleep was characterized by vertex waves, sleep spindles (12 to 14 Hz), maximal frontocentral region. EEG showed continuous generalized 6-7 Hz theta slowing.  Hyperventilation and photic stimulation were not performed.     ABNORMALITY -Continuous slow, generalized  IMPRESSION: This study is suggestive of mild diffuse encephalopathy, nonspecific etiology. No seizures or epileptiform discharges were seen throughout the recording.   [name] 

## 2020-04-05 NOTE — Plan of Care (Signed)
  Problem: Clinical Measurements: Goal: Respiratory complications will improve Outcome: Progressing   Problem: Activity: Goal: Risk for activity intolerance will decrease Outcome: Progressing   Problem: Coping: Goal: Level of anxiety will decrease Outcome: Progressing   

## 2020-04-05 NOTE — Progress Notes (Addendum)
CSW spoke with patient's niece, Tyrell, to obtain follow-up info on reaching out to [employer]. CSW contacted [employer] [phone]) to discuss situation with the patient and request that the daughter be allowed leave to come and assist with treatment planning for the patient. CSW provided clinical information on the patient's current condition. [facility] will need information on the patient's prognosis. CSW asked MD to evaluate and provide information on prognosis at this time.  CSW to call [facility] back with information once obtained. Case number is [id number]. CSW to follow.  Blenda Nicely, [place] Clinical Social Worker [phone]

## 2020-04-05 NOTE — Progress Notes (Signed)
LTM EEG discontinued - no skin breakdown at unhook.   

## 2020-04-05 NOTE — Progress Notes (Addendum)
Neurology Progress Note  Patient ID: Tamara [name] is a 59 y.o. with PMHx of  has a past medical history of type 2 diabetes, chronic kidney disease, and seizures, presenting with altered mental status in the setting of poor oral intake for several weeks.     Major interval events:  She has been refusing her oral folate Received 2 doses of IV thiamine with the next dose due at 10 AM   Subjective: Automatic speech as detailed in exam  Exam: Vitals:   04/04/20 2311 04/05/20 0330  BP: 123/87 119/76  Pulse: (!) 58 (!) 57  Resp: 14 15  Temp: 97.8 F (36.6 C) 98 F (36.7 C)  SpO2: 100% 100%    Exam  General: Thin female. Appears calm and comfortable in bed HEENT:  Normocephalic, no lesions, without obvious abnormality.  Normal external eye and external ears. Lips dry. Cardiovascular: RRR  Pulmonary: Breathing comfortably on room air Abdomen: Soft Extremities: no edema Musculoskeletal: No deformity or swelling. Tone and bulk normal throughout Skin: warm and dry, no hyperpigmentation, vitiligo, or suspicious lesions  Neuro exam: Mental Status: Tamara [name] is maintains alertness once roused though takes significant effort to rouse and only oriented to self. She does not know where she is. Speech is without dysarthria and is largely automatic (stop, leave me alone, I'm not well, I'm going to be okay). Unable to follow commands, simply repeats them sometimes.  Cranial Nerves: II: Pupils are equal, round, reactive to light; left eye slightly sluggish compared to right III,IV, VI: Marked improvement in eye movements, looks spontaneously to either side vs. Roving eye movements V,VII: Face appears symmetric VIII: Hearing grossly intact Uvula elevates symmetrically when she yawns Motor: She will move upper extremities antigravity to noxious stimuli, 2/5 in the bilateral lower extremities Sensory: Equally reactive to noxious stim in all extremities  Deep Tendon Reflexes: Trace in upper  extremities, absent in lowers Plantars: Right: downgoing                           Left: downgoing Coordination: Unable to assess Gait: Did not assess  New Pertinent Labs: CSF studies on 8/8:  Red blood cell 1, white blood cell 3, glucose 88 (serum 153, 57.5% of serum), protein 47    Impression: Tamara [name] is a 59 y.o. woman presenting with altered mental status in the setting of poor oral intake.  She remains quite confused but less somnolent than reported to me yesterday and with improved high movements in the setting of starting thiamine supplementation.  Her CSF studies are bland with a mild elevation in protein but I do not think is clinically significant.  Certainly they are not suggestive of anti-inflammatory process.  Given her profoundly poor oral intake as per suspect she likely has multiple vitamin deficiencies and have requested nutrition consult for assistance in managing this.  # Encephalopathy  Workup summary 07Aug2021, MRI Brain w/o contrast: Hyperintensity in the posteromedial thalami bilaterally 06Aug2021: CT head w/o contrast: No acute abnormality 06Aug2021, Tox screen: none detected Blood work: B12 >1,0000 (8/7) Pending labs to follow-up: Thyroid peroxidase antibody  thyroglobulin antibody  IgG CSF index enter oligoclonal bands  Autoimmune encephalitis panel Blood cultures Thiamine level Keppra level  # Poor oral intake - Consult to dietician for additional recommendations  Brooke Dare MD-PhD [facility] [phone]

## 2020-04-05 NOTE — Progress Notes (Signed)
[facility]  PROGRESS NOTE  Tamara [name] ION:[id number] DOB: 1961-02-28 DOA: 04/02/2020 PCP: [name], NP   Brief HPI:   59 year old female with medical history of diabetes mellitus type 2, hypertension, asthma, CKD stage IIIa, recent hospitalization for altered mental status where she was diagnosed with seizure disorder and started on anticonvulsant therapy.  She was brought to the ED with worsening mental status.  She has not been taking her medications and rarely eating or drinking.  No witnessed fall or seizure-like activity.  No reported fever.  No reports of unintentional overdose.  Patient was seen by neurology and work-up started for encephalopathy.    Subjective   Patient seen and examined, LTM EEG underway.  Patient alert but continues to be confused.   Assessment/Plan:     1. Acute metabolic encephalopathy-unclear etiology, patient presented with severe dehydration, AKI, electrolyte abnormalities.  She was started on D5W, B12 was within normal range, MRI brain on 04/03/2020 showed interval development of diffuse hyperintensity in the posterior medial thalamus bilaterally without restricted diffusion.  Could be subacute infarction/Wernicke's encephalopathy.  Neurology has started patient on high-dose thiamine 500 mg IV 3 times daily for 3 days.  EEG showed diffuse encephalopathy.  No seizures or epileptiform discharges seen throughout recording. 2. Hypernatremia-patient presents with sodium of 154, secondary to poor p.o. intake.  Started on D5W.  At this time sodium has improved to 145.  Will cut down D5 W+ 20 mEq KCl to 75 mill per hour.  Follow BMP in a.m. 3. Acute kidney injury on CKD stage IIIa-patient presents with creatinine of 4.48, she was started on IV fluids as above.  Creatinine has improved to 1.62.  She is getting close to her baseline.  Will cut down IV fluids as above. 4. Diabetes mellitus type 2-patient is on clear liquid diet, continue sliding-scale insulin  NovoLog.  Lantus 10 units subcu  twice daily.  CBG well controlled. 5. Asthma-continue Dulera inhalation 2 puffs twice daily, montelukast 10 mg daily 6. ?  Bacteremia-blood culture drawn on 04/02/2020 grew staph epidermidis from aerobic bottle only.?  Contamination.  Repeat blood cultures obtained yesterday are negative to date.  Follow repeat blood culture results. 7. Seizure disorder-continue IV Keppra  500 mg twice a day. 8. Hypertension-continue losartan   Scheduled medications:   . folic acid  1 mg Oral Daily  . heparin  5,000 Units Subcutaneous Q8H  . insulin aspart  0-15 Units Subcutaneous TID WC  . insulin glargine  10 Units Subcutaneous BID  . loratadine  10 mg Oral Daily  . mometasone-formoterol  2 puff Inhalation BID  . montelukast  10 mg Oral QHS  . polyethylene glycol  17 g Oral QHS         CBG: Recent Labs  Lab 04/04/20 0616 04/04/20 1156 04/04/20 1613 04/04/20 2111 04/05/20 0616  GLUCAP 140* 146* 153* 148* 115*    SpO2: 100 %    CBC: Recent Labs  Lab 04/02/20 1514 04/03/20 0918 04/04/20 0545 04/05/20 0311  WBC 15.9* 13.8* 10.5 11.5*  NEUTROABS 13.0*  --  7.4 6.7  HGB 13.9 13.7 11.6* 9.9*  HCT 44.6 44.1 37.1 32.2*  MCV 91.4 90.9 91.6 93.3  PLT 445* 403* 308 267    Basic Metabolic Panel: Recent Labs  Lab 04/03/20 1935 04/04/20 0545 04/04/20 1110 04/04/20 1846 04/05/20 0311  NA 154* 151* 150* 148* 145  K 4.2 2.8* 4.2 2.9* 3.7  CL 122* 115* 117* 114* 111  CO2 12* 18* 18*  21* 23  GLUCOSE 146* 149* 155* 151* 133*  BUN 78* 61* 55* 45* 40*  CREATININE 2.19* 1.56* 1.75* 1.68* 1.62*  CALCIUM 9.0 8.9 8.4* 8.5* 8.2*  MG  --   --   --  2.1  --      Liver Function Tests: Recent Labs  Lab 04/02/20 1514 04/03/20 0918  AST 24 27  ALT 25 23  ALKPHOS 76 72  BILITOT 1.0 0.7  PROT 8.5* 8.1  ALBUMIN 4.3 4.1     Antibiotics: Anti-infectives (From admission, onward)   Start     Dose/Rate Route Frequency Ordered Stop   04/02/20 2030   cefTRIAXone (ROCEPHIN) 1 g in sodium chloride 0.9 % 100 mL IVPB     Discontinue     1 g 200 mL/hr over 30 Minutes Intravenous Every 24 hours 04/02/20 2020         DVT prophylaxis: Heparin  Code Status: Full code  Family Communication: No family at bedside    Status is: Inpatient  Dispo: The patient is from: Home              Anticipated d/c is to: Likely skilled nursing facility              Anticipated d/c date is: 04/09/2020              Patient currently not medically stable for discharge  Barrier to discharge-ongoing work-up for metabolic encephalopathy          Consultants:  Neurology  Procedures:  EEG   Objective   Vitals:   04/04/20 1940 04/04/20 2311 04/05/20 0330 04/05/20 0908  BP: (!) 144/75 123/87 119/76 123/64  Pulse: (!) 57 (!) 58 (!) 57 (!) 58  Resp: 18 14 15 20   Temp: 97.9 F (36.6 C) 97.8 F (36.6 C) 98 F (36.7 C) 97.6 F (36.4 C)  TempSrc: Oral Axillary Axillary Oral  SpO2: 100% 100% 100% 100%  Weight:      Height:        Intake/Output Summary (Last 24 hours) at 04/05/2020 1141 Last data filed at 04/05/2020 0500 Gross per 24 hour  Intake 3004.31 ml  Output 1525 ml  Net 1479.31 ml    08/07 1901 - 08/09 0700 In: 4459.1 [I.V.:3521.1] Out: 2300 [Urine:2300]  Filed Weights   04/02/20 2318  Weight: 54.1 kg    Physical Examination:    General: Appears in no acute distress  Cardiovascular: S1-S2, regular, no murmur auscultated  Respiratory: Clear to auscultation bilaterally, no wheezing or crackles auscultated  Abdomen: Abdomen is soft, nontender, no hepatosplenomegaly  Extremities: No edema in the lower extremities  Neurologic: Alert, oriented to self only    Data Reviewed:   Recent Results (from the past 240 hour(s))  Blood culture (routine x 2)     Status: Abnormal   Collection Time: 04/02/20  5:17 PM   Specimen: BLOOD RIGHT FOREARM  Result Value Ref Range Status   Specimen Description BLOOD RIGHT FOREARM   Final   Special Requests   Final    BOTTLES DRAWN AEROBIC AND ANAEROBIC Blood Culture results may not be optimal due to an inadequate volume of blood received in culture bottles   Culture  Setup Time   Final    GRAM POSITIVE COCCI IN CLUSTERS AEROBIC BOTTLE ONLY CRITICAL RESULT CALLED TO, READ BACK BY AND VERIFIED WITH: PHARMD MADISON YATES 1332 06/02/20 FCP    Culture (A)  Final    STAPHYLOCOCCUS EPIDERMIDIS THE SIGNIFICANCE OF ISOLATING THIS  ORGANISM FROM A SINGLE SET OF BLOOD CULTURES WHEN MULTIPLE SETS ARE DRAWN IS UNCERTAIN. PLEASE NOTIFY THE MICROBIOLOGY DEPARTMENT WITHIN ONE WEEK IF SPECIATION AND SENSITIVITIES ARE REQUIRED. Performed at [facility], [address], [place], [place] [address]    Report Status 04/05/2020 FINAL  Final  Blood Culture ID Panel (Reflexed)     Status: Abnormal   Collection Time: 04/02/20  5:17 PM  Result Value Ref Range Status   Enterococcus faecalis NOT DETECTED NOT DETECTED Final   Enterococcus Faecium NOT DETECTED NOT DETECTED Final   Listeria monocytogenes NOT DETECTED NOT DETECTED Final   Staphylococcus species DETECTED (A) NOT DETECTED Final    Comment: CRITICAL RESULT CALLED TO, READ BACK BY AND VERIFIED WITH: PHARMD MADISON YATES 1332 [id number] FCP    Staphylococcus aureus (BCID) NOT DETECTED NOT DETECTED Final   Staphylococcus epidermidis DETECTED (A) NOT DETECTED Final    Comment: CRITICAL RESULT CALLED TO, READ BACK BY AND VERIFIED WITH: PHARMD MADISON YATES 1332 [id number] FCP    Staphylococcus lugdunensis NOT DETECTED NOT DETECTED Final   Streptococcus species NOT DETECTED NOT DETECTED Final   Streptococcus agalactiae NOT DETECTED NOT DETECTED Final   Streptococcus pneumoniae NOT DETECTED NOT DETECTED Final   Streptococcus pyogenes NOT DETECTED NOT DETECTED Final   A.calcoaceticus-baumannii NOT DETECTED NOT DETECTED Final   Bacteroides fragilis NOT DETECTED NOT DETECTED Final   Enterobacterales NOT DETECTED NOT DETECTED Final    Enterobacter cloacae complex NOT DETECTED NOT DETECTED Final   Escherichia coli NOT DETECTED NOT DETECTED Final   Klebsiella aerogenes NOT DETECTED NOT DETECTED Final   Klebsiella oxytoca NOT DETECTED NOT DETECTED Final   Klebsiella pneumoniae NOT DETECTED NOT DETECTED Final   Proteus species NOT DETECTED NOT DETECTED Final   Salmonella species NOT DETECTED NOT DETECTED Final   Serratia marcescens NOT DETECTED NOT DETECTED Final   Haemophilus influenzae NOT DETECTED NOT DETECTED Final   Neisseria meningitidis NOT DETECTED NOT DETECTED Final   Pseudomonas aeruginosa NOT DETECTED NOT DETECTED Final   Stenotrophomonas maltophilia NOT DETECTED NOT DETECTED Final   Candida albicans NOT DETECTED NOT DETECTED Final   Candida auris NOT DETECTED NOT DETECTED Final   Candida glabrata NOT DETECTED NOT DETECTED Final   Candida krusei NOT DETECTED NOT DETECTED Final   Candida parapsilosis NOT DETECTED NOT DETECTED Final   Candida tropicalis NOT DETECTED NOT DETECTED Final   Cryptococcus neoformans/gattii NOT DETECTED NOT DETECTED Final   Methicillin resistance mecA/C NOT DETECTED NOT DETECTED Final    Comment: Performed at [facility], [address], [place], [place] [address]  Blood culture (routine x 2)     Status: None (Preliminary result)   Collection Time: 04/02/20  5:19 PM   Specimen: BLOOD RIGHT WRIST  Result Value Ref Range Status   Specimen Description BLOOD RIGHT WRIST  Final   Special Requests   Final    BOTTLES DRAWN AEROBIC ONLY Blood Culture results may not be optimal due to an inadequate volume of blood received in culture bottles   Culture   Final    NO GROWTH 3 DAYS Performed at [facility], [address] [place], [place] [address]    Report Status PENDING  Incomplete  SARS Coronavirus 2 by RT PCR (hospital order, performed in [facility] hospital lab) Nasopharyngeal Nasopharyngeal Swab     Status: None   Collection Time: 04/02/20  6:06 PM   Specimen:  Nasopharyngeal Swab  Result Value Ref Range Status   SARS  Coronavirus 2 NEGATIVE NEGATIVE Final    Comment: (NOTE) SARS-CoV-2 target nucleic acids are NOT DETECTED.  The SARS-CoV-2 RNA is generally detectable in upper and lower respiratory specimens during the acute phase of infection. The lowest concentration of SARS-CoV-2 viral copies this assay can detect is 250 copies / mL. A negative result does not preclude SARS-CoV-2 infection and should not be used as the sole basis for treatment or other patient management decisions.  A negative result may occur with improper specimen collection / handling, submission of specimen other than nasopharyngeal swab, presence of viral mutation(s) within the areas targeted by this assay, and inadequate number of viral copies (<250 copies / mL). A negative result must be combined with clinical observations, patient history, and epidemiological information.  Fact Sheet for Patients:   BoilerBrush.com.cy  Fact Sheet for Healthcare Providers: https://pope.com/  This test is not yet approved or  cleared by the [place] FDA and has been authorized for detection and/or diagnosis of SARS-CoV-2 by FDA under an Emergency Use Authorization (EUA).  This EUA will remain in effect (meaning this test can be used) for the duration of the COVID-19 declaration under Section 564(b)(1) of the Act, 21 U.S.C. section 360bbb-3(b)(1), unless the authorization is terminated or revoked sooner.  Performed at [facility], [address] [place], [place] [address]   Urine culture     Status: Abnormal   Collection Time: 04/02/20  7:18 PM   Specimen: Urine, Random  Result Value Ref Range Status   Specimen Description URINE, RANDOM  Final   Special Requests   Final    NONE Performed at [facility], [address], [place], [place] [address]    Culture MULTIPLE SPECIES PRESENT, SUGGEST RECOLLECTION (A)   Final   Report Status 04/04/2020 FINAL  Final  CSF culture     Status: None (Preliminary result)   Collection Time: 04/04/20  3:48 PM   Specimen: CSF; Cerebrospinal Fluid  Result Value Ref Range Status   Specimen Description CSF  Final   Special Requests NONE  Final   Gram Stain   Final    WBC PRESENT, PREDOMINANTLY MONONUCLEAR NO ORGANISMS SEEN CYTOSPIN SMEAR    Culture   Final    NO GROWTH < 24 HOURS Performed at [facility], [address], [place], [place] [address]    Report Status PENDING  Incomplete  Culture, blood (routine x 2)     Status: None (Preliminary result)   Collection Time: 04/04/20  6:21 PM   Specimen: BLOOD  Result Value Ref Range Status   Specimen Description BLOOD LEFT ANTECUBITAL  Final   Special Requests   Final    BOTTLES DRAWN AEROBIC AND ANAEROBIC Blood Culture results may not be optimal due to an inadequate volume of blood received in culture bottles   Culture   Final    NO GROWTH < 24 HOURS Performed at [facility], [address], [place], [place] [address]    Report Status PENDING  Incomplete  Culture, blood (routine x 2)     Status: None (Preliminary result)   Collection Time: 04/04/20  6:43 PM   Specimen: BLOOD  Result Value Ref Range Status   Specimen Description BLOOD BLOOD LEFT HAND  Final   Special Requests   Final    BOTTLES DRAWN AEROBIC AND ANAEROBIC Blood Culture results may not be optimal due to an inadequate volume of blood received in culture bottles   Culture  Final    NO GROWTH < 24 HOURS Performed at [facility], [address] [place], [place] [address]    Report Status PENDING  Incomplete    No results for input(s): LIPASE, AMYLASE in the last 168 hours. Recent Labs  Lab 04/02/20 1515  AMMONIA 24    Cardiac Enzymes: No results for input(s): CKTOTAL, CKMB, CKMBINDEX, TROPONINI in the last 168 hours. BNP (last 3 results) No results for input(s): BNP in the last 8760 hours.  ProBNP (last 3  results) No results for input(s): PROBNP in the last 8760 hours.  Studies:  MR BRAIN WO CONTRAST  Result Date: 04/03/2020 CLINICAL DATA:  Delirium EXAM: MRI HEAD WITHOUT CONTRAST TECHNIQUE: Multiplanar, multiecho pulse sequences of the brain and surrounding structures were obtained without intravenous contrast. COMPARISON:  CT head 04/02/2020.  MRI head 03/01/2020 FINDINGS: Brain: New areas of diffusion signal in the posteromedial thalamus bilaterally. This is symmetric. No restricted diffusion but rather facilitated diffusion in these areas. No other abnormality on diffusion-weighted imaging. Normal midbrain. Pituitary normal in size. Generalized atrophy most prominent in the temporal lobes especially on the left. Hippocampal atrophy bilaterally. Negative for hemorrhage or mass. Vascular: Normal arterial flow voids. Skull and upper cervical spine: Negative Sinuses/Orbits: Negative Other: None IMPRESSION: 1. Interval development of diffusion hyperintensity in the posteromedial thalamus bilaterally without restricted diffusion. This could be subacute infarction particularly with artery of Percheron anatomy. Wernicke's encephalopathy also could give this appearance however recent B12 levels are normal. 2. Generalized atrophy most prominent in the left temporal lobe. Electronically Signed   By: Marlan Palauharles  Clark M.D.   On: 04/03/2020 18:00   EEG adult  Result Date: 04/04/2020 Rejeana BrockKirkpatrick, McNeill P, MD     04/04/2020  6:08 PM History: 59 yo F being evaluated for AMS. Sedation: None Technique: This is a 21 channel routine scalp EEG performed at the bedside with bipolar and monopolar montages arranged in accordance to the international 10/20 system of electrode placement. One channel was dedicated to EKG recording. Background: The background consists of a posterior dominant rhythm of 7 to 8 Hz with superimposed generalized irregular delta and theta range activities.  Sleep structures are seen and are symmetric in  nature. Photic stimulation: Physiologic driving is not performed EEG Abnormalities: 1) generalized irregular slow activity 2) slow posterior dominant rhythm Clinical Interpretation: This EEG is consistent with a generalized nonspecific cerebral dysfunction (encephalopathy).  There was no seizure or seizure predisposition recorded on this study. Please note that lack of epileptiform activity on EEG does not preclude the possibility of epilepsy. Ritta SlotMcNeill Kirkpatrick, MD [facility] [phone] If 7pm- 7am, please page neurology on call as listed in AMION.   Overnight EEG with video  Result Date: 04/05/2020 Charlsie QuestYadav, Priyanka O, MD     04/05/2020 10:59 AM Patient Name: Tamara BackersMalaria [name] MRN: [id number] Epilepsy Attending: Charlsie QuestPriyanka O Yadav Referring Physician/Provider: Dr. Onalee HuaMcNeil Kirkpatrick Duration: 04/04/2020 1503 2 04/05/2020 1035 Patient history: 59 year old female being evaluated for altered mental status.  EEG to assess for seizures. Level of alertness: Awake, asleep AEDs during EEG study: Keppra Technical aspects: This EEG study was done with scalp electrodes positioned according to the 10-20 International system of electrode placement. Electrical activity was acquired at a sampling rate of 500Hz  and reviewed with a high frequency filter of 70Hz  and a low frequency filter of 1Hz . EEG data were recorded continuously and digitally stored. Description: The posterior dominant rhythm consists of 8 Hz activity of moderate voltage (25-35 uV) seen predominantly in  posterior head regions, symmetric and reactive to eye opening and eye closingSleep was characterized by vertex waves, sleep spindles (12 to 14 Hz), maximal frontocentral region. EEG showed continuous generalized 6-7 Hz theta slowing.  Hyperventilation and photic stimulation were not performed.   ABNORMALITY -Continuous slow, generalized IMPRESSION: This study is suggestive of mild diffuse encephalopathy, nonspecific etiology. No seizures or epileptiform  discharges were seen throughout the recording. Priyanka Everlena Cooper   [facility] If 7PM-7AM, please contact night-coverage at [email or web address], Office  [phone]   04/05/2020, 11:41 AM  LOS: 3 days

## 2020-04-06 ENCOUNTER — Inpatient Hospital Stay (HOSPITAL_COMMUNITY): Payer: Self-pay

## 2020-04-06 LAB — GLUCOSE, CAPILLARY
Glucose-Capillary: 131 mg/dL — ABNORMAL HIGH (ref 70–99)
Glucose-Capillary: 113 mg/dL — ABNORMAL HIGH (ref 70–99)
Glucose-Capillary: 100 mg/dL — ABNORMAL HIGH (ref 70–99)
Glucose-Capillary: 121 mg/dL — ABNORMAL HIGH (ref 70–99)

## 2020-04-06 LAB — COMPREHENSIVE METABOLIC PANEL
ALT: 14 U/L (ref 0–44)
AST: 18 U/L (ref 15–41)
Albumin: 2.6 g/dL — ABNORMAL LOW (ref 3.5–5.0)
Alkaline Phosphatase: 48 U/L (ref 38–126)
Anion gap: 9 (ref 5–15)
BUN: 22 mg/dL — ABNORMAL HIGH (ref 6–20)
CO2: 21 mmol/L — ABNORMAL LOW (ref 22–32)
Calcium: 8.7 mg/dL — ABNORMAL LOW (ref 8.9–10.3)
Chloride: 110 mmol/L (ref 98–111)
Creatinine, Ser: 1.4 mg/dL — ABNORMAL HIGH (ref 0.44–1.00)
GFR calc Af Amer: 48 mL/min — ABNORMAL LOW (ref >60)
GFR calc non Af Amer: 42 mL/min — ABNORMAL LOW (ref >60)
Glucose, Bld: 128 mg/dL — ABNORMAL HIGH (ref 70–99)
Potassium: 3.1 mmol/L — ABNORMAL LOW (ref 3.5–5.1)
Sodium: 140 mmol/L (ref 135–145)
Total Bilirubin: 0.4 mg/dL (ref 0.3–1.2)
Total Protein: 5.5 g/dL — ABNORMAL LOW (ref 6.5–8.1)

## 2020-04-06 LAB — CBC
HCT: 28.4 % — ABNORMAL LOW (ref 36.0–46.0)
Hemoglobin: 9.1 g/dL — ABNORMAL LOW (ref 12.0–15.0)
MCH: 28.9 pg (ref 26.0–34.0)
MCHC: 32 g/dL (ref 30.0–36.0)
MCV: 90.2 fL (ref 80.0–100.0)
Platelets: 261 10*3/uL (ref 150–400)
RBC: 3.15 MIL/uL — ABNORMAL LOW (ref 3.87–5.11)
RDW: 13.4 % (ref 11.5–15.5)
WBC: 9.4 10*3/uL (ref 4.0–10.5)
nRBC: 0 % (ref 0.0–0.2)

## 2020-04-06 LAB — LEVETIRACETAM LEVEL: Levetiracetam Lvl: <1 ug/mL — ABNORMAL LOW (ref 10.0–40.0)

## 2020-04-06 LAB — IGG CSF INDEX
Albumin CSF-mCnc: 30 mg/dL (ref 8–37)
Albumin: 3.7 g/dL — ABNORMAL LOW (ref 3.8–4.9)
CSF IgG Index: 0.5 (ref 0.0–0.7)
IgG (Immunoglobin G), Serum: 1043 mg/dL (ref 586–1602)
IgG, CSF: 4.6 mg/dL (ref 0.0–6.7)
IgG/Alb Ratio, CSF: 0.15 (ref 0.00–0.25)

## 2020-04-06 MED ORDER — GADOBUTROL 1 MMOL/ML IV SOLN
5.0000 mL | Freq: Once | INTRAVENOUS | Status: AC | PRN
  Administered 2020-04-06: 5 mL via INTRAVENOUS

## 2020-04-06 NOTE — Progress Notes (Signed)
[facility]  PROGRESS NOTE  Vito BackersMalaria Ledwell RUE:[id number]:[id number] DOB: September 25, 1960 DOA: 04/02/2020 PCP: Lavinia SharpsPlacey, Mary Ann, NP   Brief HPI:   59 year old female with medical history of diabetes mellitus type 2, hypertension, asthma, CKD stage IIIa, recent hospitalization for altered mental status where she was diagnosed with seizure disorder and started on anticonvulsant therapy.  She was brought to the ED with worsening mental status.  She has not been taking her medications and rarely eating or drinking.  No witnessed fall or seizure-like activity.  No reported fever.  No reports of unintentional overdose.  Patient was seen by neurology and work-up started for encephalopathy.    Subjective   Patient seen and examined, alert but continues to be pleasantly confused.   Assessment/Plan:     1. Acute metabolic encephalopathy-unclear etiology, patient presented with severe dehydration, AKI, electrolyte abnormalities.  She was started on D5W, B12 was within normal range, MRI brain on 04/03/2020 showed interval development of diffuse hyperintensity in the posterior medial thalamus bilaterally without restricted diffusion.  Could be subacute infarction/Wernicke's encephalopathy.  Neurology has started patient on high-dose thiamine 500 mg IV 3 times daily for 3 days.  EEG showed diffuse encephalopathy.  No seizures or epileptiform discharges seen throughout recording.  Follow thiamine level, Keppra level, thyroid peroxidase antibody, thyroglobulin antibody, autoimmune encephalitis panel. 2. Hypernatremia-patient presents with sodium of 154, secondary to poor p.o. intake.  Started on D5W.  At this time sodium has improved to 140.  Continue D5 W+ 20 mEq KCl to 75 mill per hour.  Follow BMP in a.m. 3. Acute kidney injury on CKD stage IIIa-patient presents with creatinine of 4.48, she was started on IV fluids as above.  Creatinine has improved to 1.40.  She is getting close to her baseline.    4. Hypokalemia-potassium is 3.9 this morning.  Will replace potassium and follow BMP in a.m. 5. Diabetes mellitus type 2-patient is on clear liquid diet, continue sliding-scale insulin NovoLog.  Lantus 10 units subcu  twice daily.  CBG well controlled. 6. Asthma-continue Dulera inhalation 2 puffs twice daily, montelukast 10 mg daily 7. ?  Bacteremia-blood culture drawn on 04/02/2020 grew staph epidermidis from aerobic bottle only.?  Contamination.  Repeat blood cultures obtained yesterday are negative to date.  Follow repeat blood culture results. 8. Seizure disorder-continue IV Keppra  500 mg twice a day. 9. Hypertension-continue losartan   Scheduled medications:   . feeding supplement  1 Container Oral TID BM  . folic acid  1 mg Oral Daily  . heparin  5,000 Units Subcutaneous Q8H  . insulin aspart  0-15 Units Subcutaneous TID WC  . insulin glargine  10 Units Subcutaneous BID  . loratadine  10 mg Oral Daily  . mometasone-formoterol  2 puff Inhalation BID  . montelukast  10 mg Oral QHS  . multivitamin with minerals  1 tablet Oral Daily  . polyethylene glycol  17 g Oral QHS         CBG: Recent Labs  Lab 04/05/20 0616 04/05/20 1225 04/05/20 1529 04/05/20 2129 04/06/20 0607  GLUCAP 115* 141* 138* 105* 131*    SpO2: 97 %    CBC: Recent Labs  Lab 04/02/20 1514 04/03/20 0918 04/04/20 0545 04/05/20 0311 04/06/20 0223  WBC 15.9* 13.8* 10.5 11.5* 9.4  NEUTROABS 13.0*  --  7.4 6.7  --   HGB 13.9 13.7 11.6* 9.9* 9.1*  HCT 44.6 44.1 37.1 32.2* 28.4*  MCV 91.4 90.9 91.6 93.3 90.2  PLT 445* 403* 308 267  261    Basic Metabolic Panel: Recent Labs  Lab 04/04/20 0545 04/04/20 1110 04/04/20 1846 04/05/20 0311 04/06/20 0223  NA 151* 150* 148* 145 140  K 2.8* 4.2 2.9* 3.7 3.1*  CL 115* 117* 114* 111 110  CO2 18* 18* 21* 23 21*  GLUCOSE 149* 155* 151* 133* 128*  BUN 61* 55* 45* 40* 22*  CREATININE 1.56* 1.75* 1.68* 1.62* 1.40*  CALCIUM 8.9 8.4* 8.5* 8.2* 8.7*  MG  --    --  2.1  --   --      Liver Function Tests: Recent Labs  Lab 04/02/20 1514 04/03/20 0918 04/06/20 0223  AST ALT ALKPHOS 76 72 48  BILITOT 1.0 0.7 0.4  PROT 8.5* 8.1 5.5*  ALBUMIN 4.3 4.1 2.6*     Antibiotics: Anti-infectives (From admission, onward)   Start     Dose/Rate Route Frequency Ordered Stop   04/02/20 2030  cefTRIAXone (ROCEPHIN) 1 g in sodium chloride 0.9 % 100 mL IVPB     Discontinue     1 g 200 mL/hr over 30 Minutes Intravenous Every 24 hours 04/02/20 2020 04/06/20 2359       DVT prophylaxis: Heparin  Code Status: Full code  Family Communication: No family at bedside    Status is: Inpatient  Dispo: The patient is from: Home              Anticipated d/c is to: Likely skilled nursing facility              Anticipated d/c date is: 04/09/2020              Patient currently not medically stable for discharge  Barrier to discharge-ongoing work-up for metabolic encephalopathy       Consultants:  Neurology  Procedures:  EEG   Objective   Vitals:   04/05/20 2319 04/06/20 0318 04/06/20 0720 04/06/20 0845  BP: 111/60 (!) 103/57 (!) 96/57   Pulse: 65 75 76   Resp: Temp: 98.5 F (36.9 C) 98.9 F (37.2 C) 99 F (37.2 C)   TempSrc: Oral Oral Axillary   SpO2: 100% 100% 100% 97%  Weight:      Height:        Intake/Output Summary (Last 24 hours) at 04/06/2020 1032 Last data filed at 04/05/2020 1600 Gross per 24 hour  Intake 1420.94 ml  Output 454 ml  Net 966.94 ml    08/08 1901 - 08/10 0700 In: 3441.3 [I.V.:2653.3] Out: 1179 [Urine:1179]  Filed Weights   04/02/20 2318  Weight: 54.1 kg    Physical Examination:    General-appears in no acute distress  Heart-S1-S2, regular, no murmur auscultated  Lungs-clear to auscultation bilaterally, no wheezing or crackles auscultated  Abdomen-soft, nontender, no organomegaly  Extremities-no edema in the lower extremities  Neuro-alert, oriented to self  only   Data Reviewed:   Recent Results (from the past 240 hour(s))  Blood culture (routine x 2)     Status: Abnormal   Collection Time: 04/02/20  5:17 PM   Specimen: BLOOD RIGHT FOREARM  Result Value Ref Range Status   Specimen Description BLOOD RIGHT FOREARM  Final   Special Requests   Final    BOTTLES DRAWN AEROBIC AND ANAEROBIC Blood Culture results may not be optimal due to an inadequate volume of blood received in culture bottles   Culture  Setup Time   Final    GRAM POSITIVE COCCI IN  CLUSTERS AEROBIC BOTTLE ONLY CRITICAL RESULT CALLED TO, READ BACK BY AND VERIFIED WITH: PHARMD MADISON YATES [id number] FCP    Culture (A)  Final    STAPHYLOCOCCUS EPIDERMIDIS THE SIGNIFICANCE OF ISOLATING THIS ORGANISM FROM A SINGLE SET OF BLOOD CULTURES WHEN MULTIPLE SETS ARE DRAWN IS UNCERTAIN. PLEASE NOTIFY THE MICROBIOLOGY DEPARTMENT WITHIN ONE WEEK IF SPECIATION AND SENSITIVITIES ARE REQUIRED. Performed at [facility], [address] [place], [place] [address]    Report Status 04/05/2020 FINAL  Final  Blood Culture ID Panel (Reflexed)     Status: Abnormal   Collection Time: 04/02/20  5:17 PM  Result Value Ref Range Status   Enterococcus faecalis NOT DETECTED NOT DETECTED Final   Enterococcus Faecium NOT DETECTED NOT DETECTED Final   Listeria monocytogenes NOT DETECTED NOT DETECTED Final   Staphylococcus species DETECTED (A) NOT DETECTED Final    Comment: CRITICAL RESULT CALLED TO, READ BACK BY AND VERIFIED WITH: PHARMD MADISON YATES [id number] FCP    Staphylococcus aureus (BCID) NOT DETECTED NOT DETECTED Final   Staphylococcus epidermidis DETECTED (A) NOT DETECTED Final    Comment: CRITICAL RESULT CALLED TO, READ BACK BY AND VERIFIED WITH: PHARMD MADISON YATES [id number] [id number] FCP    Staphylococcus lugdunensis NOT DETECTED NOT DETECTED Final   Streptococcus species NOT DETECTED NOT DETECTED Final   Streptococcus agalactiae NOT DETECTED NOT DETECTED Final   Streptococcus  pneumoniae NOT DETECTED NOT DETECTED Final   Streptococcus pyogenes NOT DETECTED NOT DETECTED Final   A.calcoaceticus-baumannii NOT DETECTED NOT DETECTED Final   Bacteroides fragilis NOT DETECTED NOT DETECTED Final   Enterobacterales NOT DETECTED NOT DETECTED Final   Enterobacter cloacae complex NOT DETECTED NOT DETECTED Final   Escherichia coli NOT DETECTED NOT DETECTED Final   Klebsiella aerogenes NOT DETECTED NOT DETECTED Final   Klebsiella oxytoca NOT DETECTED NOT DETECTED Final   Klebsiella pneumoniae NOT DETECTED NOT DETECTED Final   Proteus species NOT DETECTED NOT DETECTED Final   Salmonella species NOT DETECTED NOT DETECTED Final   Serratia marcescens NOT DETECTED NOT DETECTED Final   Haemophilus influenzae NOT DETECTED NOT DETECTED Final   Neisseria meningitidis NOT DETECTED NOT DETECTED Final   Pseudomonas aeruginosa NOT DETECTED NOT DETECTED Final   Stenotrophomonas maltophilia NOT DETECTED NOT DETECTED Final   Candida albicans NOT DETECTED NOT DETECTED Final   Candida auris NOT DETECTED NOT DETECTED Final   Candida glabrata NOT DETECTED NOT DETECTED Final   Candida krusei NOT DETECTED NOT DETECTED Final   Candida parapsilosis NOT DETECTED NOT DETECTED Final   Candida tropicalis NOT DETECTED NOT DETECTED Final   Cryptococcus neoformans/gattii NOT DETECTED NOT DETECTED Final   Methicillin resistance mecA/C NOT DETECTED NOT DETECTED Final    Comment: Performed at [facility], [address], [place], [place] [address]  Blood culture (routine x 2)     Status: None (Preliminary result)   Collection Time: 04/02/20  5:19 PM   Specimen: BLOOD RIGHT WRIST  Result Value Ref Range Status   Specimen Description BLOOD RIGHT WRIST  Final   Special Requests   Final    BOTTLES DRAWN AEROBIC ONLY Blood Culture results may not be optimal due to an inadequate volume of blood received in culture bottles   Culture   Final    NO GROWTH 3 DAYS Performed at [facility],  [address] [place], [place] [address]    Report Status PENDING  Incomplete  SARS Coronavirus 2 by RT PCR (hospital order,  performed in [facility] hospital lab) Nasopharyngeal Nasopharyngeal Swab     Status: None   Collection Time: 04/02/20  6:06 PM   Specimen: Nasopharyngeal Swab  Result Value Ref Range Status   SARS Coronavirus 2 NEGATIVE NEGATIVE Final    Comment: (NOTE) SARS-CoV-2 target nucleic acids are NOT DETECTED.  The SARS-CoV-2 RNA is generally detectable in upper and lower respiratory specimens during the acute phase of infection. The lowest concentration of SARS-CoV-2 viral copies this assay can detect is 250 copies / mL. A negative result does not preclude SARS-CoV-2 infection and should not be used as the sole basis for treatment or other patient management decisions.  A negative result may occur with improper specimen collection / handling, submission of specimen other than nasopharyngeal swab, presence of viral mutation(s) within the areas targeted by this assay, and inadequate number of viral copies (<250 copies / mL). A negative result must be combined with clinical observations, patient history, and epidemiological information.  Fact Sheet for Patients:   BoilerBrush.com.cy  Fact Sheet for Healthcare Providers: https://pope.com/  This test is not yet approved or  cleared by the [place] FDA and has been authorized for detection and/or diagnosis of SARS-CoV-2 by FDA under an Emergency Use Authorization (EUA).  This EUA will remain in effect (meaning this test can be used) for the duration of the COVID-19 declaration under Section 564(b)(1) of the Act, 21 U.S.C. section 360bbb-3(b)(1), unless the authorization is terminated or revoked sooner.  Performed at [facility], [address] [place], [place] [address]   Urine culture     Status: Abnormal   Collection Time: 04/02/20  7:18 PM   Specimen:  Urine, Random  Result Value Ref Range Status   Specimen Description URINE, RANDOM  Final   Special Requests   Final    NONE Performed at [facility], [address] [place], [place] [address]    Culture MULTIPLE SPECIES PRESENT, SUGGEST RECOLLECTION (A)  Final   Report Status 04/04/2020 FINAL  Final  CSF culture     Status: None (Preliminary result)   Collection Time: 04/04/20  3:48 PM   Specimen: CSF; Cerebrospinal Fluid  Result Value Ref Range Status   Specimen Description CSF  Final   Special Requests NONE  Final   Gram Stain   Final    WBC PRESENT, PREDOMINANTLY MONONUCLEAR NO ORGANISMS SEEN CYTOSPIN SMEAR    Culture   Final    NO GROWTH 2 DAYS Performed at [facility], [address], [place], [place] [address]    Report Status PENDING  Incomplete  Culture, blood (routine x 2)     Status: None (Preliminary result)   Collection Time: 04/04/20  6:21 PM   Specimen: BLOOD  Result Value Ref Range Status   Specimen Description BLOOD LEFT ANTECUBITAL  Final   Special Requests   Final    BOTTLES DRAWN AEROBIC AND ANAEROBIC Blood Culture results may not be optimal due to an inadequate volume of blood received in culture bottles   Culture   Final    NO GROWTH < 24 HOURS Performed at [facility], [address] [place], [place] [address]    Report Status PENDING  Incomplete  Culture, blood (routine x 2)     Status: None (Preliminary result)   Collection Time: 04/04/20  6:43 PM   Specimen: BLOOD  Result Value Ref Range Status   Specimen Description BLOOD BLOOD LEFT HAND  Final  Special Requests   Final    BOTTLES DRAWN AEROBIC AND ANAEROBIC Blood Culture results may not be optimal due to an inadequate volume of blood received in culture bottles   Culture   Final    NO GROWTH < 24 HOURS Performed at [facility], [address] [place], [place] [address]    Report Status PENDING  Incomplete    No results for input(s): LIPASE, AMYLASE in the  last 168 hours. Recent Labs  Lab 04/02/20 1515  AMMONIA 24    Cardiac Enzymes: No results for input(s): CKTOTAL, CKMB, CKMBINDEX, TROPONINI in the last 168 hours. BNP (last 3 results) No results for input(s): BNP in the last 8760 hours.  ProBNP (last 3 results) No results for input(s): PROBNP in the last 8760 hours.  Studies:  EEG adult  Result Date: 04/04/2020 Rejeana Brock, MD     04/04/2020  6:08 PM History: 59 yo F being evaluated for AMS. Sedation: None Technique: This is a 21 channel routine scalp EEG performed at the bedside with bipolar and monopolar montages arranged in accordance to the international 10/20 system of electrode placement. One channel was dedicated to EKG recording. Background: The background consists of a posterior dominant rhythm of 7 to 8 Hz with superimposed generalized irregular delta and theta range activities.  Sleep structures are seen and are symmetric in nature. Photic stimulation: Physiologic driving is not performed EEG Abnormalities: 1) generalized irregular slow activity 2) slow posterior dominant rhythm Clinical Interpretation: This EEG is consistent with a generalized nonspecific cerebral dysfunction (encephalopathy).  There was no seizure or seizure predisposition recorded on this study. Please note that lack of epileptiform activity on EEG does not preclude the possibility of epilepsy. Ritta Slot, MD [facility] [phone] If 7pm- 7am, please page neurology on call as listed in AMION.   Overnight EEG with video  Result Date: 04/05/2020 Charlsie Quest, MD     04/05/2020 10:59 AM Patient Name: Dannielle Baskins MRN: [id number] Epilepsy Attending: Charlsie Quest Referring Physician/Provider: Dr. Onalee Hua Duration: 04/04/2020 1503 2 04/05/2020 1035 Patient history: 59 year old female being evaluated for altered mental status.  EEG to assess for seizures. Level of alertness: Awake, asleep AEDs during EEG study: Keppra  Technical aspects: This EEG study was done with scalp electrodes positioned according to the 10-20 International system of electrode placement. Electrical activity was acquired at a sampling rate of 500Hz  and reviewed with a high frequency filter of 70Hz  and a low frequency filter of 1Hz . EEG data were recorded continuously and digitally stored. Description: The posterior dominant rhythm consists of 8 Hz activity of moderate voltage (25-35 uV) seen predominantly in posterior head regions, symmetric and reactive to eye opening and eye closingSleep was characterized by vertex waves, sleep spindles (12 to 14 Hz), maximal frontocentral region. EEG showed continuous generalized 6-7 Hz theta slowing.  Hyperventilation and photic stimulation were not performed.   ABNORMALITY -Continuous slow, generalized IMPRESSION: This study is suggestive of mild diffuse encephalopathy, nonspecific etiology. No seizures or epileptiform discharges were seen throughout the recording. Priyanka   [facility] If 7PM-7AM, please contact night-coverage at [email or web address], Office  [phone]   04/06/2020, 10:32 AM  LOS: 4 days

## 2020-04-06 NOTE — Progress Notes (Addendum)
Neurology Progress Note  Patient ID: Tamara [name] is a 59 y.o. with PMHx of  has a past medical history of type 2 diabetes, chronic kidney disease, and seizures, cocaine abuse (in remission) B12 of presenting with altered mental status in the setting of poor oral intake for several weeks.    Major interval events:  Social work requesting prognosis information to help facilitate daughter visiting Minimally improving mental status   Additional history from sister Tamara [name]  Memory issues started at least a year ago,  -Couldn't remember people she hadn't seen in a while (school friends)  -Stopped cooking because she couldn't remember ingredients   Ms. Valentino Saxon reports that the patient does have a remote history of cocaine use but the family has no concern for any recent or ongoing substance use at this time.  Ms. Valentino Saxon had been living with her daughter until August 2020 when her daughter was deployed to [place].  Her daughter did help her move into the apartment, but the patient did not have any difficulty with navigating bus schedules to get around and was always able to tell Ms. Cherry exactly where she was whenever Ms. Cherry called.  Ms. Valentino Saxon does think that the patient stopped eating in mid July due to stomach pain while eating.  She is not sure why the patient was hypoglycemic in late June, citing a family function in early June and during which the patient ate quite well (though she also took home much of her plate to eat at home later).  Since mid June her intake has been largely applesauce and yogurt type food as well as drinks.  Reportedly this is due to stomach pain with eating.  Subjective: Automatic speech as detailed in exam  Exam: Vitals:   04/06/20 0720 04/06/20 0845  BP: (!) 96/57   Pulse: 76   Resp: 20   Temp: 99 F (37.2 C)   SpO2: 100% 97%    Exam  General: Thin female. Appears calm and comfortable in bed HEENT:  Normocephalic, no lesions, without obvious  abnormality.  Normal external eye and external ears. Lips dry. Cardiovascular: RRR  Pulmonary: Breathing comfortably on room air Abdomen: Soft Extremities: no edema Musculoskeletal: No deformity or swelling. Tone and bulk normal throughout Skin: warm and dry, no hyperpigmentation, vitiligo, or suspicious lesions  Neuro exam: Mental Status: Aloha Hollenkamp maintains alertness once roused though takes significant effort to rouse and only oriented to self. She does not know where she is, states [place]. Speech is without dysarthria and is largely automatic (stop, stop means stop, do not do that). Unable to follow commands, simply repeats them sometimes.  Cranial Nerves: II: Pupils are equal, round, reactive to light; left eye slightly sluggish compared to right III,IV, VI: Marked improvement in eye movements, looks spontaneously to either side vs. Roving eye movements V,VII: Face appears symmetric VIII: Hearing grossly intact Uvula elevates symmetrically when she yawns Motor: She will move upper extremities antigravity to noxious stimuli, 2/5 in the bilateral lower extremities Sensory: Equally reactive to noxious stim in all extremities  Deep Tendon Reflexes: Trace in upper extremities, absent in lowers on prior exams, not assessed today Plantars: Right: downgoing                           Left: downgoing Coordination: Unable to assess Gait: Did not assess  New Pertinent Labs: TPO-antibody 16  Thyroglobulin antibody < 1.0 Cr improving to 1.4, GFR 42  Impression: Tamara [name] is a 59 y.o. woman presenting with altered mental status in the setting of poor oral intake.  She remains quite confused but less somnolent than reported to me yesterday and with improved eye movements in the setting of starting thiamine supplementation.  Her CSF studies are bland with a mild elevation in protein but I do not think is clinically significant (likely simply related to her diabetes).  Certainly CSF is  not suggestive of an inflammatory process.  Given her profoundly poor oral intake as per suspect she likely has multiple vitamin deficiencies and have requested nutrition consult for assistance in managing this.  Overall, metabolic encephalopathy due to intermittent nutritional deficiency remains at the top of my differential, and it can take weeks to months to achieve maximal improvement after nutritional repletion.  I am a bit puzzled by the patient's history and underlying reason for the poor oral intake.  Additionally, the event that she had in the beginning of July does sound pretty concerning for a possible seizure, however it does not sound like she has had further events, and extended EEG monitoring here has been negative.  Notably she presented with hypoglycemia to 39 on June 20 (which Ms. Valentino Saxon was not sure about the circumstances of and why her sister's sugar had gotten so low; notably hemoglobin A1c was 6.2 on 7/5 and has historically been 6.9 in 2014 to 6.7 in March 2020).  While hypoglycemia can be a trigger for seizure, her blood glucose was normal even on EMS arrival in July.  The time course of her cognitive decline is difficult to pin down even after reinterviewing her family today.  While her CSF studies are reassuring against an inflammatory process, we were not able to obtain enough fluid to send for neurodegenerative studies, which may be considered if she is not improving further in next few days or if her repeat MRI brain is more concerning for a degenerative process. Given that her renal function has improved with the excellent supportive care provided by Dr. Daune Perch team, I would like to next obtain this MRI brain with and without contrast.  This will help clarify whether the findings are typical for Warnicke's encephalopathy, and while I would not expect the original findings to have resolved after a few days of vitamin supplementation, we can at least confirm that there is no  progression of brain pathology suggestive of an alternative diagnosis.   If her renal function continues to improve, will consider CT chest abdomen pelvis with and without contrast for malignancy screening  # Encephalopathy  Workup summary 05July2021 negative HIV, negative RPR, 07Aug2021, MRI Brain w/o contrast: Hyperintensity in the posteromedial thalami bilaterally 06Aug2021: CT head w/o contrast: No acute abnormality 06Aug2021, Tox screen: none detected 07Aug2021 Blood work: B12 >1,0000 (8/7) 06-08Aug2021 Blood cultures initial likely contaminant, subsequent NGTD  08Aug2021 CSF studies: Red blood cell 1, white blood cell 3, glucose 88 (serum 153, 57.5% of serum), protein 47 Aug2021 TPO-antibody 16  Aug2021 Thyroglobulin antibody < 1.0 Pending labs to follow-up: -IgG CSF index  -CSF oligoclonal bands  -Autoimmune encephalitis panel -Thiamine level -Keppra level Additional work-up  MRI brain w/ and w/o contrast   # Poor oral intake - Consult to dietician for additional recommendations  Greater than 35 minutes was spent with the patient and obtaining additional history from family to try to further clarify the time course of her memory complaints  Brooke Dare MD-PhD [facility] [phone]

## 2020-04-06 NOTE — Progress Notes (Signed)
CSW received call from Tobi Bastos [phone]) with the [employer] asking if any additional information was available on the patient's condition, and that they are awaiting a response from the daughter's superior on the ship. [employer] is unaware if she is off on a ship or not, and they do not know her assignment, so they do not know when they will have a response on whether the daughter is being released or not. Tobi Bastos will continue to be in touch with CSW on patient's condition and with any update on the daughter's release.   Blenda Nicely, [place] Clinical Social Worker [phone]

## 2020-04-06 NOTE — Plan of Care (Signed)
  Problem: Coping: Goal: Level of anxiety will decrease Outcome: Progressing   Problem: Pain Managment: Goal: General experience of comfort will improve Outcome: Progressing   Problem: Safety: Goal: Ability to remain free from injury will improve Outcome: Progressing   

## 2020-04-07 ENCOUNTER — Inpatient Hospital Stay (HOSPITAL_COMMUNITY): Payer: Self-pay

## 2020-04-07 LAB — GLUCOSE, CAPILLARY
Glucose-Capillary: 113 mg/dL — ABNORMAL HIGH (ref 70–99)
Glucose-Capillary: 120 mg/dL — ABNORMAL HIGH (ref 70–99)
Glucose-Capillary: 136 mg/dL — ABNORMAL HIGH (ref 70–99)
Glucose-Capillary: 93 mg/dL (ref 70–99)

## 2020-04-07 LAB — CSF CULTURE W GRAM STAIN: Culture: NO GROWTH

## 2020-04-07 LAB — CULTURE, BLOOD (ROUTINE X 2): Culture: NO GROWTH

## 2020-04-07 LAB — BASIC METABOLIC PANEL
Anion gap: 10 (ref 5–15)
BUN: 13 mg/dL (ref 6–20)
CO2: 20 mmol/L — ABNORMAL LOW (ref 22–32)
Calcium: 9.1 mg/dL (ref 8.9–10.3)
Chloride: 112 mmol/L — ABNORMAL HIGH (ref 98–111)
Creatinine, Ser: 1.35 mg/dL — ABNORMAL HIGH (ref 0.44–1.00)
GFR calc Af Amer: 50 mL/min — ABNORMAL LOW (ref >60)
GFR calc non Af Amer: 43 mL/min — ABNORMAL LOW (ref >60)
Glucose, Bld: 117 mg/dL — ABNORMAL HIGH (ref 70–99)
Potassium: 3.1 mmol/L — ABNORMAL LOW (ref 3.5–5.1)
Sodium: 142 mmol/L (ref 135–145)

## 2020-04-07 LAB — CBC
HCT: 26.9 % — ABNORMAL LOW (ref 36.0–46.0)
Hemoglobin: 8.9 g/dL — ABNORMAL LOW (ref 12.0–15.0)
MCH: 30.2 pg (ref 26.0–34.0)
MCHC: 33.1 g/dL (ref 30.0–36.0)
MCV: 91.2 fL (ref 80.0–100.0)
Platelets: 237 10*3/uL (ref 150–400)
RBC: 2.95 MIL/uL — ABNORMAL LOW (ref 3.87–5.11)
RDW: 13.5 % (ref 11.5–15.5)
WBC: 9.1 10*3/uL (ref 4.0–10.5)
nRBC: 0 % (ref 0.0–0.2)

## 2020-04-07 LAB — MISC LABCORP TEST (SEND OUT)

## 2020-04-07 LAB — OLIGOCLONAL BANDS, CSF + SERM

## 2020-04-07 MED ORDER — GADOBUTROL 1 MMOL/ML IV SOLN
5.0000 mL | Freq: Once | INTRAVENOUS | Status: AC | PRN
  Administered 2020-04-07: 5 mL via INTRAVENOUS

## 2020-04-07 MED ORDER — LORAZEPAM 2 MG/ML IJ SOLN: 1.0000 mg | Freq: Once | INTRAMUSCULAR | Status: DC | PRN

## 2020-04-07 MED ORDER — POTASSIUM CHLORIDE 10 MEQ/100ML IV SOLN
10.0000 meq | INTRAVENOUS | Status: AC
  Administered 2020-04-07 (×6): 10 meq via INTRAVENOUS
  Filled 2020-04-07 (×4): qty 100

## 2020-04-07 MED ORDER — M.V.I. ADULT IV INJ
INTRAVENOUS | Status: DC
  Filled 2020-04-07 (×2): qty 1000

## 2020-04-07 NOTE — Evaluation (Signed)
Physical Therapy Evaluation Patient Details Name: Tamara [name] MRN: [id number] DOB: 1961/02/08 Today's Date: 04/07/2020   History of Present Illness  Tamara [name] is a 59 y.o. female with medical history significant for type II DM, hyperlipidemia and asthma who presents to the emergency department via EMS due to altered mental status and unwitnessed fall. 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 Neurology recommending Keppra    Clinical Impression  Patient was laying in bed asleep at start of session. A&Ox1 to person. Could not recall date of birth. Patient was both lethargic (needing stimulation to stay awake) and impulsive when she was awake (trying to remove IV, cardiac monitor, and elbow brace) throughout session. Attempted to roll patient to her R side of the bed with TotalA, repeated max cueing for hand placement and leg positioning, and repeated max verbal cueing, however patient was physically resistant to move. Noted bowel incontinence when attempting to roll. When repositioning patient in bed, she stated that she wanted [place] to "take her to the husband store because she was worried about all of them taking care of their wives." Continued to attempt to remove her IV, cardiac monitor, and elbow brace. Patient was left laying in bed with all needs within reach and bed alarm set. Communicated with nursing staff about incontinence.     Follow Up Recommendations SNF;Supervision/Assistance - 24 hour    Equipment Recommendations  Rolling walker with 5" wheels;3in1 (PT)    Recommendations for Other Services       Precautions / Restrictions Precautions Precautions: Fall Restrictions Weight Bearing Restrictions: No      Mobility  Bed Mobility Overal bed mobility: Needs Assistance Bed Mobility: Rolling Rolling: Total assist         General bed mobility comments: patient physically resistant  to Total A rolling, could not understand verbal or tactile cues  Transfers                 General transfer comment: not able to assess  Ambulation/Gait             General Gait Details: not able to assess  Stairs            Wheelchair Mobility    Modified Rankin (Stroke Patients Only)       Balance Overall balance assessment: Needs assistance     Sitting balance - Comments: not able to assess       Standing balance comment: not able to assess                             Pertinent Vitals/Pain Pain Assessment: Faces Faces Pain Scale: Hurts little more Pain Location: R elbow over IV and brace area Pain Descriptors / Indicators: Grimacing;Discomfort Pain Intervention(s): Monitored during session    Home Living Family/patient expects to be discharged to:: Private residence Living Arrangements: Other (Comment) (boyfriend) Available Help at Discharge: Available PRN/intermittently;Family;Friend(s) Type of Home: Apartment Home Access: Stairs to enter   Entrance Stairs-Number of Steps: 3-5 Home Layout: One level   Additional Comments: Pt limited historian with AMS. No family present to confirm home set up    Prior Function Level of Independence: Needs assistance   Gait / Transfers Assistance Needed: No need for assistive device at baseline  ADL's / Homemaking Assistance Needed: Some assist with ADLs and IADLs from boyfriend per pt report  Comments: Pt limited historian  with AMS. No family present to confirm PLOF. Pt reports no longer driving     Hand Dominance   Dominant Hand: Right    Extremity/Trunk Assessment   Upper Extremity Assessment Upper Extremity Assessment: Defer to OT evaluation    Lower Extremity Assessment Lower Extremity Assessment: Generalized weakness RLE Coordination: decreased gross motor LLE Coordination: decreased gross motor    Cervical / Trunk Assessment Cervical / Trunk Assessment:  (unable to  assess)  Communication   Communication: Other (comment) (per MRI: potential wernicke's)  Cognition Arousal/Alertness: Lethargic Behavior During Therapy: Flat affect Overall Cognitive Status: No family/caregiver present to determine baseline cognitive functioning Area of Impairment: Attention;Memory;Safety/judgement;Awareness;Problem solving;Following commands;Orientation                 Orientation Level: Disoriented to;Place;Time;Situation Current Attention Level: Focused Memory: Decreased short-term memory;Decreased recall of precautions Following Commands: Follows one step commands inconsistently Safety/Judgement: Decreased awareness of deficits;Decreased awareness of safety Awareness: Intellectual Problem Solving: Difficulty sequencing;Requires verbal cues;Requires tactile cues;Slow processing;Decreased initiation General Comments: Repeatedly asked about taking IV out, asked [place] to take her to the "husband store" and was worried about them taking care of their wives, resisting all commands, could not recall birthday. Significantly unaware of deficits and safety.      General Comments      Exercises     Assessment/Plan    PT Assessment Patient needs continued PT services  PT Problem List Decreased cognition;Decreased activity tolerance;Decreased balance;Decreased coordination;Decreased knowledge of use of DME;Decreased safety awareness;Decreased knowledge of precautions       PT Treatment Interventions DME instruction;Gait training;Functional mobility training;Therapeutic activities;Therapeutic exercise;Balance training;Neuromuscular re-education;Cognitive remediation;Patient/family education    PT Goals (Current goals can be found in the Care Plan section)  Acute Rehab PT Goals Patient Stated Goal: patient unable to participate in goal setting PT Goal Formulation: With patient Time For Goal Achievement: 04/21/20 Potential to Achieve Goals: Fair    Frequency Min  2X/week   Barriers to discharge        Co-evaluation               AM-PAC PT "6 Clicks" Mobility  Outcome Measure Help needed turning from your back to your side while in a flat bed without using bedrails?: Total Help needed moving from lying on your back to sitting on the side of a flat bed without using bedrails?: Total Help needed moving to and from a bed to a chair (including a wheelchair)?: Total Help needed standing up from a chair using your arms (e.g., wheelchair or bedside chair)?: Total Help needed to walk in hospital room?: Total Help needed climbing 3-5 steps with a railing? : Total 6 Click Score: 6    End of Session   Activity Tolerance: Other (comment) (Patient limited by inability to follow commands, bowel incontinence, and resistance to total A bed mobility.) Patient left: in bed;with call bell/phone within reach;with bed alarm set Nurse Communication: Mobility status PT Visit Diagnosis: Unsteadiness on feet (R26.81);Other abnormalities of gait and mobility (R26.89);Other symptoms and signs involving the nervous system (R29.898)    Time:  -      Charges:             Elisha Ponder, SPT, ATC

## 2020-04-07 NOTE — Progress Notes (Addendum)
Neurology Progress Note  Patient ID: Tamara [name] is a 59 y.o. with PMHx of  has a past medical history of type 2 diabetes, chronic kidney disease, and seizures, cocaine abuse (in remission) B12 of presenting with altered mental status in the setting of poor oral intake for several weeks.    Major interval events:  MRI brain with and without contrast completed   Subjective: Improving speech and thought process today.  She is able to repeat [facility] today although she still initially states she was in [place].  She is able to mimic thumbs up but cannot mimic showing me 2 fingers.  She does not appear to understand commands, but is able to more convincingly converse than prior days.  She is much more alert.  Exam: Vitals:   04/07/20 1507 04/07/20 1620  BP: 122/74 122/82  Pulse:  61  Resp:  18  Temp:  98.6 F (37 C)  SpO2:  98%    Exam  General: Thin female. Appears calm and comfortable in bed HEENT:  Normocephalic, no lesions, without obvious abnormality.  Normal external eye and external ears.  Moist mucous membranes Cardiovascular:  Regular rate and rhythm, palpable radial pulses bilaterally  Pulmonary: Breathing comfortably on room air Abdomen: Soft Extremities: no edema Musculoskeletal: No deformity or swelling. Tone and bulk normal throughout Skin: warm and dry, no hyperpigmentation, vitiligo, or suspicious lesions  Neuro exam: Mental Status:  As documented above under "subjective" Cranial Nerves: II: Pupils are equal, round, III,IV, VI:  Can track examiner's face in all directions V,VII: Face appears symmetric VIII: Hearing grossly intact IX, X: Tongue is midline, and uvula elevates symmetrically  Motor: She will move upper extremities antigravity to command, 2/5 in the bilateral lower extremities Sensory: Equally reactive to light touch in all extremities  Deep Tendon Reflexes: 2+ brachioradialis bilaterally in upper extremities, trace patellar's  bilaterally, improving from prior Coordination:  Did not assess Gait: Did not assess  New Pertinent Labs: Keppra level undetectable on admission  Impression: Tamara [name] is a 59 y.o. woman presenting with altered mental status in the setting of poor oral intake.  Today I am very happy with the improvement in her mental status, and encouraged that addressing her nutritional status is likely the key to her clinical improvement.  Again it can take weeks to months to see full improvement, and sometimes patients do not fully recover from this severe and nutritional injury.  At this time her repeat MRI remains reassuring (which I personally reviewed).  Given that an artery of Percheron stroke is on the differential, will additionally complete an MRA head and neck to evaluate for this anatomic variant.  Should she have an artery of Percheron, a stroke work-up can be additionally completed.  The etiology of her poor p.o. intake remains unclear.  # Encephalopathy  Workup summary 05July2021 negative HIV, negative RPR, 07Aug2021, MRI Brain w/o contrast: Hyperintensity in the posteromedial thalami bilaterally 06Aug2021: CT head w/o contrast: No acute abnormality 06Aug2021, Tox screen: none detected 07Aug2021 Blood work: B12 >1,0000 (8/7) 06-08Aug2021 Blood cultures initial likely contaminant, subsequent NGTD  08Aug2021 CSF studies: Red blood cell 1, white blood cell 3, glucose 88 (serum 153, 57.5% of serum), protein 47 Aug2021 TPO-antibody 16  Aug2021 Thyroglobulin antibody < 1.0 Keppra level undetectable on admission Pending labs to follow-up: -IgG CSF index  -CSF oligoclonal bands  -Autoimmune encephalitis panel -Thiamine level Additional work-up  MRA neck with and without contrast, MRA head without contrast  # Poor oral  intake -Appreciate dietician recommendations  Brooke Dare MD-PhD [facility] [phone]

## 2020-04-07 NOTE — Progress Notes (Signed)
PROGRESS NOTE    Tamara [name]  VWU:[id number] DOB: 26-Jan-1961 DOA: 04/02/2020 PCP: [name], NP    Brief Narrative:  Patient admitted to the hospital with the working diagnosis of metabolic encephalopathy due to AKI, in the setting of thiamine deficiency and bilateral thalamic subacute infarcts.   59 year old female with past medical history of type 2 diabetes mellitus, hypertension, chronic kidney disease, asthma and seizures who presented with acute altered mentation.  She has several weeks of worsening mental status, positive confusion and noncompliance with her medications.  On her initial physical examination blood pressure 132/89, heart rate 72, respiratory rate 13, temperature 97.4, oxygen saturation 91%.  She had dry mucous membranes, her lungs were clear to auscultation bilaterally, heart S1-S2, present rhythm, soft abdomen, no lower extremity edema, she was awake, alert, and nonfocal. Sodium 153, potassium 3.4, chloride 111, bicarb 18, glucose 293, BUN 141, cr 4.8 acid 4.3, hemoglobin 15.9, hemoglobin 13.9, hematocrit 44.6, platelets 445.  SARS COVID-19 was negative.  Urinalysis specific gravity 1.010, 21-50 epithelial cells, 11-20 white cells, 0-5 red cells.  Toxicology screen negative.  CT of the abdomen no acute changes.  Chest radiograph no infiltrates. Brain MRI with diffusion hyperintensity in the posterior medial thalamus bilaterally, possible subacute infarct. EKG 112 bpm, left axis deviation, normal intervals, sinus rhythm, poor R wave progression, no ST segment or T wave changes.  Further work up with brain MRI with diffuse hyperintensity in the posterior medial thalamus bilaterally, possible infarction/ Wernicke's encephalopathy. Lumbar puncture with negative ab and 3 wbc.   Patient has been placed on high doses of thiamine. EEG with no active seizures.   Assessment & Plan:   Principal Problem:   AKI (acute kidney injury) (HCC) Active Problems:   Asthma with acute  exacerbation   Diabetes (HCC)   Acute encephalopathy   Acute metabolic encephalopathy   Protein-calorie malnutrition, severe    1 Acute metabolic encephalopathy, multifactorial. Patient today is somnolent, opens eyes to voice but not following commands. Not apparent pain.  Pending thyroid peroxidase ab, thyroglobulin ab negative; autoimmune encephalitis panel.  Will continue supportive medical therapy with high dose thiamine (500 mg IV tid) and hydration with IV fluids. Patient not getting oral multivitamins po, will chang to IV along with IV folic acid. Check P.  Will consult speech therapy, PT and OT.  Follow neurology recommendations for MRI with and without contrast. Continue with Keppra for seizure prophylaxis.  Continue with fall and aspiration precautions.   2. AKI on ckd stage 3a/ hypernatremia/ hypokalemia/ non anion gap metabolic acidosis. Renal function with serum Na 142, K is down to 3,1, bicarbonate 20, with serum cr 1,35.   Add 60 meq Kcl IV and check Mg and P in am. Continue close follow up on renal function and electrolytes.   3. Controlled T2DM Hgb A1c 6,2. Fasting glucose 117 this am. Continue insulin sliding scale and basal insulin (glargine 10 units bid) for glucose cover and monitoring.  Patient has been on IV dextrose.   4. Asthma With no signs of exacerbation, continue with dulera and montelukast (not getting po meds)   5. HTN. Blood pressure has been low 89/68 mmHg. Continue to hold on antihypertensive medications.  Echocardiogram from 10/2018 with preserved LV systolic function.   6. Severe protein calorie protein malnutrition. Continue with nutritional supplements.   Patient continue to be at high risk for worsening encephalopathy.   Status is: Inpatient  Remains inpatient appropriate because:IV treatments appropriate due to intensity of illness  or inability to take PO   Dispo: The patient is from: Home              Anticipated d/c is to: SNF               Anticipated d/c date is: 3 days              Patient currently is not medically stable to d/c.   DVT prophylaxis: Enoxaparin   Code Status:   full  Family Communication:  No family at the bedside      Nutrition Status: Nutrition Problem: Severe Malnutrition Etiology: acute illness Signs/Symptoms: energy intake < or equal to 50% for > or equal to 5 days, moderate fat depletion, moderate muscle depletion, percent weight loss Percent weight loss: 21.5 % Interventions: Boost Breeze, MVI     Consultants:   Neurology       Subjective: Patient continue to be very somnolent and hypo reactive, no in pain or dyspnea, not following commands.   Objective: Vitals:   04/06/20 2100 04/06/20 2335 04/07/20 0300 04/07/20 0721  BP: 97/69 (!) 91/58 (!) 89/68 (!) 89/60  Pulse: 60 65 69 71  Resp: 17 18 17 18   Temp: 97.7 F (36.5 C) 98.2 F (36.8 C) 97.9 F (36.6 C) 98.1 F (36.7 C)  TempSrc: Oral Oral Axillary Axillary  SpO2: 98% 100% 100% 100%  Weight:      Height:        Intake/Output Summary (Last 24 hours) at 04/07/2020 0845 Last data filed at 04/06/2020 2217 Gross per 24 hour  Intake 0 ml  Output 1650 ml  Net -1650 ml   Filed Weights   04/02/20 2318  Weight: 54.1 kg    Examination:   General: deconditioned and ill looking appearing  Neurology: somnolent and hyporeactive, open her eyes to voice and touch.  E ENT: no pallor, no icterus, oral mucosa moist Cardiovascular: No JVD. S1-S2 present, rhythmic, no gallops, rubs, or murmurs. No lower extremity edema. Pulmonary: positive breath sounds bilaterally, adequate air movement, no wheezing, rhonchi or rales. Gastrointestinal. Abdomen soft and non tender Skin. No rashes Musculoskeletal: no joint deformities     Data Reviewed: I have personally reviewed following labs and imaging studies  CBC: Recent Labs  Lab 04/02/20 1514 04/02/20 1514 04/03/20 0918 04/04/20 0545 04/05/20 0311 04/06/20 0223  04/07/20 0430  WBC 15.9*   < > 13.8* 10.5 11.5* 9.4 9.1  NEUTROABS 13.0*  --   --  7.4 6.7  --   --   HGB 13.9   < > 13.7 11.6* 9.9* 9.1* 8.9*  HCT 44.6   < > 44.1 37.1 32.2* 28.4* 26.9*  MCV 91.4   < > 90.9 91.6 93.3 90.2 91.2  PLT 445*   < > 403* 308 267 261 237   < > = values in this interval not displayed.   Basic Metabolic Panel: Recent Labs  Lab 04/04/20 1110 04/04/20 1846 04/05/20 0311 04/06/20 0223 04/07/20 0430  NA 150* 148* 145 140 142  K 4.2 2.9* 3.7 3.1* 3.1*  CL 117* 114* 111 110 112*  CO2 18* 21* 23 21* 20*  GLUCOSE 155* 151* 133* 128* 117*  BUN 55* 45* 40* 22* 13  CREATININE 1.75* 1.68* 1.62* 1.40* 1.35*  CALCIUM 8.4* 8.5* 8.2* 8.7* 9.1  MG  --  2.1  --   --   --    GFR: Estimated Creatinine Clearance: 39.3 mL/min (A) (by C-G formula based on SCr of 1.35  mg/dL (H)). Liver Function Tests: Recent Labs  Lab 04/02/20 1514 04/03/20 0918 04/04/20 1608 04/06/20 0223  AST 24 27  --  18  ALT 25 23  --  14  ALKPHOS 76 72  --  48  BILITOT 1.0 0.7  --  0.4  PROT 8.5* 8.1  --  5.5*  ALBUMIN 4.3 4.1 3.7* 2.6*   No results for input(s): LIPASE, AMYLASE in the last 168 hours. Recent Labs  Lab 04/02/20 1515  AMMONIA 24   Coagulation Profile: Recent Labs  Lab 04/03/20 0918  INR 1.3*   Cardiac Enzymes: No results for input(s): CKTOTAL, CKMB, CKMBINDEX, TROPONINI in the last 168 hours. BNP (last 3 results) No results for input(s): PROBNP in the last 8760 hours. HbA1C: No results for input(s): HGBA1C in the last 72 hours. CBG: Recent Labs  Lab 04/06/20 0607 04/06/20 1145 04/06/20 1743 04/06/20 2120 04/07/20 0605  GLUCAP 131* 113* 100* 121* 113*   Lipid Profile: No results for input(s): CHOL, HDL, LDLCALC, TRIG, CHOLHDL, LDLDIRECT in the last 72 hours. Thyroid Function Tests: No results for input(s): TSH, T4TOTAL, FREET4, T3FREE, THYROIDAB in the last 72 hours. Anemia Panel: No results for input(s): VITAMINB12, FOLATE, FERRITIN, TIBC, IRON,  RETICCTPCT in the last 72 hours.    Radiology Studies: I have reviewed all of the imaging during this hospital visit personally     Scheduled Meds: . feeding supplement  1 Container Oral TID BM  . folic acid  1 mg Oral Daily  . heparin  5,000 Units Subcutaneous Q8H  . insulin aspart  0-15 Units Subcutaneous TID WC  . insulin glargine  10 Units Subcutaneous BID  . loratadine  10 mg Oral Daily  . mometasone-formoterol  2 puff Inhalation BID  . montelukast  10 mg Oral QHS  . multivitamin with minerals  1 tablet Oral Daily  . polyethylene glycol  17 g Oral QHS   Continuous Infusions: . dextrose 5 % with KCl 20 mEq / L 20 mEq (04/07/20 0419)  . levETIRAcetam 500 mg (04/06/20 2119)  . thiamine injection 500 mg (04/06/20 2157)     LOS: 5 days        Trinka Keshishyan Annett Gula, MD

## 2020-04-08 DIAGNOSIS — E43 Unspecified severe protein-calorie malnutrition: Secondary | ICD-10-CM

## 2020-04-08 LAB — BASIC METABOLIC PANEL
Anion gap: 8 (ref 5–15)
BUN: 8 mg/dL (ref 6–20)
CO2: 19 mmol/L — ABNORMAL LOW (ref 22–32)
Calcium: 9.1 mg/dL (ref 8.9–10.3)
Chloride: 115 mmol/L — ABNORMAL HIGH (ref 98–111)
Creatinine, Ser: 1.22 mg/dL — ABNORMAL HIGH (ref 0.44–1.00)
GFR calc Af Amer: 57 mL/min — ABNORMAL LOW (ref >60)
GFR calc non Af Amer: 49 mL/min — ABNORMAL LOW (ref >60)
Glucose, Bld: 115 mg/dL — ABNORMAL HIGH (ref 70–99)
Potassium: 3.5 mmol/L (ref 3.5–5.1)
Sodium: 142 mmol/L (ref 135–145)

## 2020-04-08 LAB — GLUCOSE, CAPILLARY
Glucose-Capillary: 100 mg/dL — ABNORMAL HIGH (ref 70–99)
Glucose-Capillary: 90 mg/dL (ref 70–99)
Glucose-Capillary: 79 mg/dL (ref 70–99)
Glucose-Capillary: 71 mg/dL (ref 70–99)

## 2020-04-08 LAB — MAGNESIUM: Magnesium: 1.3 mg/dL — ABNORMAL LOW (ref 1.7–2.4)

## 2020-04-08 LAB — PHOSPHORUS: Phosphorus: 2 mg/dL — ABNORMAL LOW (ref 2.5–4.6)

## 2020-04-08 MED ORDER — THIAMINE HCL 100 MG/ML IJ SOLN
INTRAVENOUS | Status: DC
  Filled 2020-04-08 (×16): qty 1000

## 2020-04-08 MED ORDER — MAGNESIUM SULFATE 2 GM/50ML IV SOLN
2.0000 g | Freq: Once | INTRAVENOUS | Status: AC
  Administered 2020-04-08: 2 g via INTRAVENOUS
  Filled 2020-04-08: qty 50

## 2020-04-08 MED ORDER — POTASSIUM CHLORIDE 10 MEQ/100ML IV SOLN
10.0000 meq | INTRAVENOUS | Status: AC
  Administered 2020-04-08 (×5): 10 meq via INTRAVENOUS
  Filled 2020-04-08 (×5): qty 100

## 2020-04-08 NOTE — Progress Notes (Signed)
Discussed with Dr. Ella Jubilee today that neurology will sign off on this case given the patient is gradually improving. Her final outstanding lab, thiamine, resulted low at 22.9 with a reference range of 66-100.  We did discuss that she may not fully recover given the extent of her nutritional brain injury.  Given no seizures on long-term monitoring, benign CSF, I do not have a high index of suspicion for an inflammatory process at this point, do not think any further neurological work-up is necessary.   We did discuss that should the patient worsen with her mental status or have any new neurological findings, neurology should be reconsulted.  Brooke Dare MD-PhD [facility] [phone]  No charge

## 2020-04-08 NOTE — Progress Notes (Addendum)
PROGRESS NOTE    Tamara [name]  JJO:[id number] DOB: 10-Jul-1961 DOA: 04/02/2020 PCP: [name], NP    Brief Narrative:  Patient admitted to the hospital with the working diagnosis of metabolic encephalopathy due to AKI, in the setting of thiamine deficiency and bilateral thalamic subacute infarcts.   59 year old female with past medical history of type 2 diabetes mellitus, hypertension, chronic kidney disease, asthma and seizures who presented with acute altered mentation.  She has several weeks of worsening mental status, positive confusion and noncompliance with her medications.  On her initial physical examination blood pressure 132/89, heart rate 72, respiratory rate 13, temperature 97.4, oxygen saturation 91%.  She had dry mucous membranes, her lungs were clear to auscultation bilaterally, heart S1-S2, present rhythm, soft abdomen, no lower extremity edema, she was awake, alert, and nonfocal. Sodium 153, potassium 3.4, chloride 111, bicarb 18, glucose 293, BUN 141, cr 4.8 acid 4.3, hemoglobin 15.9, hemoglobin 13.9, hematocrit 44.6, platelets 445.  SARS COVID-19 was negative.  Urinalysis specific gravity 1.010, 21-50 epithelial cells, 11-20 white cells, 0-5 red cells.  Toxicology screen negative.  CT of the abdomen no acute changes.  Chest radiograph no infiltrates. Brain MRI with diffusion hyperintensity in the posterior medial thalamus bilaterally, possible subacute infarct. EKG 112 bpm, left axis deviation, normal intervals, sinus rhythm, poor R wave progression, no ST segment or T wave changes.  Further work up with brain MRI with diffuse hyperintensity in the posterior medial thalamus bilaterally, possible infarction/ Wernicke's encephalopathy. Lumbar puncture with negative ab and 3 wbc.   Patient has been placed on high doses of thiamine. EEG with no active seizures.     Assessment & Plan:   Principal Problem:   AKI (acute kidney injury) (HCC) Active Problems:   Asthma  with acute exacerbation   Diabetes (HCC)   Acute encephalopathy   Acute metabolic encephalopathy   Protein-calorie malnutrition, severe   1 Acute metabolic encephalopathy, multifactorial. Thyroid peroxidase ab, thyroglobulin ab negative. This am patient able to give her name and tell she was in the hospital, she followed commands, per nursing report. At the time of my examination she is more easy to arouse but did not follow commands. Continue to be somnolent. Poor oral intake.   Follow up brain and cervical MRI/A with no acute significant changes.   She has completed 9 doses of high 500 mg IV thiamine. Continue with IV dextrose with IV thiamine, multivitamins and folic acid. Pending speech therapy evaluation.  Continue supportive medical therapy, neuro checks, aspiration and fall precautions.   Patient will need SNF.   2. AKI on ckd stage 3a/ hypernatremia/ hypokalemia/  hyperchloremia non anion gap metabolic acidosis. Continue to have very poor oral intake, renal function with serum cr down to 1,22 with K at 3,5 and cl at 115, serum bicarbonate at 19,  P is 2,0.   Continue IV dextrose, will add 50 meq Kcl IV and 2 g Mag sulfate, continue follow up renal function and electrolytes in am.   3. Controlled T2DM Hgb A1c 6,2. Fasting glucose 115 this am. Patient on insulin sliding scale and basal insulin (glargine 10 units bid) with good toleration.  Continue with IV dextrose.   4. Asthma With no acute signs of exacerbation, on dulera and montelukast.  5. HTN. Echocardiogram from 10/2018 with preserved LV systolic function. Continue to have low blood pressure 95/53, keep MAP more than 65.   Continue close monitoring and hold on antihypertensive medications.   6. Severe protein calorie  protein malnutrition.  On nutritional supplements but very poor oral intake    Patient continue to be at high risk for worsening encephalopathy   Status is: Inpatient  Remains inpatient  appropriate because:Inpatient level of care appropriate due to severity of illness   Dispo: The patient is from: Home              Anticipated d/c is to: SNF              Anticipated d/c date is: 3 days              Patient currently is not medically stable to d/c.   DVT prophylaxis: Enoxaparin   Code Status:    full  Family Communication:  No family at the bedside.  I called her sister, unable to reach her at this time, I left a message on her voicemail    Nutrition Status: Nutrition Problem: Severe Malnutrition Etiology: acute illness Signs/Symptoms: energy intake < or equal to 50% for > or equal to 5 days, moderate fat depletion, moderate muscle depletion, percent weight loss Percent weight loss: 21.5 % Interventions: Boost Breeze, MVI     Consultants:   Neurology       Subjective: Patient continue to be very somnolent, lethargic. Not following commands to me, but she did this am to nursing. No po intake.   Objective: Vitals:   04/07/20 1924 04/07/20 2335 04/08/20 0320 04/08/20 0743  BP: 132/83 113/82 136/77 (!) 94/53  Pulse: 64 62 64 60  Resp: 18 18 18 16   Temp: 97.9 F (36.6 C) 98 F (36.7 C) 98.2 F (36.8 C) 98.4 F (36.9 C)  TempSrc: Axillary Axillary Axillary Axillary  SpO2: 100% 100% 100% 98%  Weight:      Height:        Intake/Output Summary (Last 24 hours) at 04/08/2020 0900 Last data filed at 04/08/2020 0525 Gross per 24 hour  Intake 1503.44 ml  Output 1800 ml  Net -296.56 ml   Filed Weights   04/02/20 2318  Weight: 54.1 kg    Examination:   General: deconditioned and ill looking appearing  Neurology: somnolent and lethargic, opens eyes to voice and touch, not following commands or answering to questions.   E ENT: no pallor, no icterus, oral mucosa moist Cardiovascular: No JVD. S1-S2 present, rhythmic, no gallops, rubs, or murmurs. No lower extremity edema. Pulmonary:  Positive breath sounds bilaterally, adequate air movement, no  wheezing, rhonchi or rales. Gastrointestinal. Abdomen soft and non tender Skin. No rashes Musculoskeletal: no joint deformities     Data Reviewed: I have personally reviewed following labs and imaging studies  CBC: Recent Labs  Lab 04/02/20 1514 04/02/20 1514 04/03/20 0918 04/04/20 0545 04/05/20 0311 04/06/20 0223 04/07/20 0430  WBC 15.9*   < > 13.8* 10.5 11.5* 9.4 9.1  NEUTROABS 13.0*  --   --  7.4 6.7  --   --   HGB 13.9   < > 13.7 11.6* 9.9* 9.1* 8.9*  HCT 44.6   < > 44.1 37.1 32.2* 28.4* 26.9*  MCV 91.4   < > 90.9 91.6 93.3 90.2 91.2  PLT 445*   < > 403* 308 267 261 237   < > = values in this interval not displayed.   Basic Metabolic Panel: Recent Labs  Lab 04/04/20 1846 04/05/20 0311 04/06/20 0223 04/07/20 0430 04/08/20 0137  NA 148* 145 140 142 142  K 2.9* 3.7 3.1* 3.1* 3.5  CL 114* 111 110 112* 115*  CO2 21* 23 21* 20* 19*  GLUCOSE 151* 133* 128* 117* 115*  BUN 45* 40* 22* 13 8  CREATININE 1.68* 1.62* 1.40* 1.35* 1.22*  CALCIUM 8.5* 8.2* 8.7* 9.1 9.1  MG 2.1  --   --   --  1.3*  PHOS  --   --   --   --  2.0*   GFR: Estimated Creatinine Clearance: 43.5 mL/min (A) (by C-G formula based on SCr of 1.22 mg/dL (H)). Liver Function Tests: Recent Labs  Lab 04/02/20 1514 04/03/20 0918 04/04/20 1608 04/06/20 0223  AST 24 27  --  18  ALT 25 23  --  14  ALKPHOS 76 72  --  48  BILITOT 1.0 0.7  --  0.4  PROT 8.5* 8.1  --  5.5*  ALBUMIN 4.3 4.1 3.7* 2.6*   No results for input(s): LIPASE, AMYLASE in the last 168 hours. Recent Labs  Lab 04/02/20 1515  AMMONIA 24   Coagulation Profile: Recent Labs  Lab 04/03/20 0918  INR 1.3*   Cardiac Enzymes: No results for input(s): CKTOTAL, CKMB, CKMBINDEX, TROPONINI in the last 168 hours. BNP (last 3 results) No results for input(s): PROBNP in the last 8760 hours. HbA1C: No results for input(s): HGBA1C in the last 72 hours. CBG: Recent Labs  Lab 04/07/20 0605 04/07/20 1102 04/07/20 1615 04/07/20 2220  04/08/20 0604  GLUCAP 113* 120* 136* 93 100*   Lipid Profile: No results for input(s): CHOL, HDL, LDLCALC, TRIG, CHOLHDL, LDLDIRECT in the last 72 hours. Thyroid Function Tests: No results for input(s): TSH, T4TOTAL, FREET4, T3FREE, THYROIDAB in the last 72 hours. Anemia Panel: No results for input(s): VITAMINB12, FOLATE, FERRITIN, TIBC, IRON, RETICCTPCT in the last 72 hours.    Radiology Studies: I have reviewed all of the imaging during this hospital visit personally     Scheduled Meds: . feeding supplement  1 Container Oral TID BM  . heparin  5,000 Units Subcutaneous Q8H  . insulin aspart  0-15 Units Subcutaneous TID WC  . insulin glargine  10 Units Subcutaneous BID  . loratadine  10 mg Oral Daily  . mometasone-formoterol  2 puff Inhalation BID  . montelukast  10 mg Oral QHS  . polyethylene glycol  17 g Oral QHS   Continuous Infusions: . banana bag IV 1000 mL 100 mL/hr at 04/08/20 0437  . levETIRAcetam Stopped (04/07/20 2306)     LOS: 6 days        Rhiann Boucher Annett Gula, MD

## 2020-04-08 NOTE — Evaluation (Signed)
Occupational Therapy Evaluation Patient Details Name: Tamara [name] MRN: [id number] DOB: 1961/02/12 Today's Date: 04/08/2020    History of Present Illness Jing Partch is a 59 y.o. female with medical history significant for type II DM, hyperlipidemia and asthma who presents to the emergency department via EMS due to altered mental status and unwitnessed fall. 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 Neurology recommending Keppra   Clinical Impression   PTA patient reports independent with ADLs/ mobility, living alone; but limited historian with AMS, per chart review required some assist for ADLs/ IADLs and living with significant other. Admitted for above and limited by problem list below, including impaired cognition, generalized weakness, and decreased activity tolerance.  Limited eval due to lethargy.  Patient opens eyes to name, limited verbalizations, following simple commands (squeeze my hand, give me a thumbs up, raise your arm), but unable to sequence simple ADL grooming tasks requiring total assist to wash face/hands. At this time she requires total assist for ADLs, total assist +2 to reposition in bed.  She will benefit from continued OT services while admitted and after dc at SNF level to optimize return to PLOF with ADLs, mobility.     Follow Up Recommendations  SNF;Supervision/Assistance - 24 hour    Equipment Recommendations  Other (comment) (TBD at next venue of care)    Recommendations for Other Services       Precautions / Restrictions Precautions Precautions: Fall Restrictions Weight Bearing Restrictions: No      Mobility Bed Mobility Overal bed mobility: Needs Assistance             General bed mobility comments: total assist +2 to reposition in bed  Transfers                 General transfer comment: deferred    Balance                                            ADL either performed or assessed with clinical judgement   ADL Overall ADL's : Needs assistance/impaired     Grooming: Total assistance;Bed level Grooming Details (indicate cue type and reason): hand over hand assist to wash face and hands                              Functional mobility during ADLs: Total assistance;+2 for physical assistance;+2 for safety/equipment General ADL Comments: total assist for all self care at this time      Vision   Vision Assessment?: Vision impaired- to be further tested in functional context Additional Comments: patient unable to correctly identifiy number of fingers in visual fields, anticipate cognitive piece as perseverating on "2"      Perception     Praxis      Pertinent Vitals/Pain Pain Assessment: Faces Faces Pain Scale: Hurts little more Pain Location: with repositoining in bed  Pain Descriptors / Indicators: Grimacing;Discomfort Pain Intervention(s): Limited activity within patient's tolerance;Monitored during session;Repositioned     Hand Dominance Right   Extremity/Trunk Assessment Upper Extremity Assessment Upper Extremity Assessment: Generalized weakness (able to lift B UEs and squeeze hands )           Communication Communication Communication: Expressive difficulties   Cognition Arousal/Alertness: Lethargic Behavior During Therapy: Flat affect  Overall Cognitive Status: Difficult to assess                                 General Comments: patient oriented to place, unable to verbalize birthday; follows simple 1 step commands with increased time but poor problem solving, sequencing basic ADLs and unable to maintain eyes open    General Comments       Exercises     Shoulder Instructions      Home Living Family/patient expects to be discharged to:: Private residence Living Arrangements: Alone                               Additional Comments: pt  unable to report home setup, but reports living alone      Prior Functioning/Environment          Comments: pt with limited history, per patient she was independent but per chart she needed assist with ADLs/IADLs         OT Problem List: Decreased strength;Decreased range of motion;Decreased activity tolerance;Impaired balance (sitting and/or standing);Impaired vision/perception;Decreased coordination;Decreased cognition;Decreased safety awareness;Decreased knowledge of use of DME or AE;Decreased knowledge of precautions      OT Treatment/Interventions: Self-care/ADL training;Balance training;Patient/family education;Visual/perceptual remediation/compensation;Cognitive remediation/compensation;Therapeutic activities;DME and/or AE instruction;Therapeutic exercise    OT Goals(Current goals can be found in the care plan section) Acute Rehab OT Goals Patient Stated Goal: patient unable to participate in goal setting Time For Goal Achievement: 04/22/20 Potential to Achieve Goals: Fair  OT Frequency: Min 2X/week   Barriers to D/C:            Co-evaluation              AM-PAC OT "6 Clicks" Daily Activity     Outcome Measure Help from another person eating meals?: Total Help from another person taking care of personal grooming?: Total Help from another person toileting, which includes using toliet, bedpan, or urinal?: Total Help from another person bathing (including washing, rinsing, drying)?: Total Help from another person to put on and taking off regular upper body clothing?: Total Help from another person to put on and taking off regular lower body clothing?: Total 6 Click Score: 6   End of Session Nurse Communication: Mobility status  Activity Tolerance: Patient tolerated treatment well Patient left: in bed;with call bell/phone within reach;with bed alarm set  OT Visit Diagnosis: Other abnormalities of gait and mobility (R26.89);Muscle weakness (generalized)  (M62.81);Other symptoms and signs involving cognitive function;Cognitive communication deficit (R41.841)                Time: 3710-6269 OT Time Calculation (min): 12 min Charges:  OT General Charges $OT Visit: 1 Visit OT Evaluation $OT Eval Moderate Complexity: 1 Mod  Barry Brunner, OT Acute Rehabilitation Services Pager [phone] Office [phone]   Chancy Milroy 04/08/2020, 11:32 AM

## 2020-04-08 NOTE — Evaluation (Signed)
Clinical/Bedside Swallow Evaluation Patient Details  Name: Tamara [name] MRN: [id number] Date of Birth: August 28, 1961  Today's Date: 04/08/2020 Time: SLP Start Time (ACUTE ONLY): 0950 SLP Stop Time (ACUTE ONLY): 1010 SLP Time Calculation (min) (ACUTE ONLY): 20 min  Past Medical History:  Past Medical History:  Diagnosis Date  . Memory changes    Past Surgical History: History reviewed. No pertinent surgical history. HPI:  59 yo female admitted 04/02/20 after a fall with AMS. Dx metabolic encephalopathy d/t AKI, thiamine deficiency PMH: several weeks of worsening mental status, asthma, DM, HTN, CKD3a, seizure d/o, dyslipidemia, chest pain. Hospitalized 7/5/21MRI = bilateral thalamic subacute infarcts   Assessment / Plan / Recommendation Clinical Impression  Limited BSE completed. Pt was awake and alert, and exhibited fully intelligible speech. Pt unable to participate in CN examination due to confusion, however, no obvious orofacial weakness or asymmetry was noted. Pt appears to have adequate dentition, but pt was generally uncooperative with this assessment. SLP attempted oral care, which pt refused. Pt also refused to complete oral care herself. Trials of ice chips and water were attempted. Pt said "no", closed her lips tightly, and turned her head away from me. RN and NT both indicate pt has refused all po intake and medications. SLP will continue efforts to reassess pt swallow function and safety if pt will cooperate. RN and MD informed.   SLP Visit Diagnosis: Dysphagia, unspecified (R13.10)    Aspiration Risk  Mild aspiration risk;Risk for inadequate nutrition/hydration    Diet Recommendation Thin liquid (continue thin/full liquids if pt will accept)   Medication Administration: Via alternative means    Other  Recommendations Oral Care Recommendations: Oral care QID   Follow up Recommendations 24 hour supervision/assistance      Frequency and Duration min 2x/week  2 weeks;1 week        Prognosis Prognosis for Safe Diet Advancement: Fair Barriers to Reach Goals: Cognitive deficits;Behavior      Swallow Study   General Date of Onset: 04/02/20 HPI: 59 yo female admitted 04/02/20 after a fall with AMS. Dx metabolic encephalopathy d/t AKI, thiamine deficiency PMH: several weeks of worsening mental status, asthma, DM, HTN, CKD3a, seizure d/o, dyslipidemia, chest pain. Hospitalized 7/5/21MRI = bilateral thalamic subacute infarcts Type of Study: Bedside Swallow Evaluation Previous Swallow Assessment: BSE 03/01/20, Esophgram = mild dysmotility, chronic reflux Diet Prior to this Study: Thin liquids (clear liquid) Temperature Spikes Noted: No Respiratory Status: Room air History of Recent Intubation: No Behavior/Cognition: Alert;Confused;Uncooperative;Doesn't follow directions Oral Cavity Assessment: Other (comment) (pt unwilling to participate) Oral Care Completed by SLP:  (attempted - pt would not allow SLP to perform oral care, and would not complete it herself) Oral Cavity - Dentition: Adequate natural dentition Vision: Functional for self-feeding Self-Feeding Abilities: Refused PO Patient Positioning: Upright in bed Baseline Vocal Quality: Normal Volitional Cough: Cognitively unable to elicit Volitional Swallow: Unable to elicit    Oral/Motor/Sensory Function Overall Oral Motor/Sensory Function:  (unable to fully assess, however, no obvious issues noted)   Tamara [name], [facility], CCC-SLP Speech Language Pathologist Office: [phone]  Tamara [name] 04/08/2020,10:21 AM

## 2020-04-09 LAB — COMPREHENSIVE METABOLIC PANEL
ALT: 21 U/L (ref 0–44)
AST: 23 U/L (ref 15–41)
Albumin: 2.5 g/dL — ABNORMAL LOW (ref 3.5–5.0)
Alkaline Phosphatase: 49 U/L (ref 38–126)
Anion gap: 7 (ref 5–15)
BUN: 5 mg/dL — ABNORMAL LOW (ref 6–20)
CO2: 20 mmol/L — ABNORMAL LOW (ref 22–32)
Calcium: 9.1 mg/dL (ref 8.9–10.3)
Chloride: 112 mmol/L — ABNORMAL HIGH (ref 98–111)
Creatinine, Ser: 1.15 mg/dL — ABNORMAL HIGH (ref 0.44–1.00)
GFR calc Af Amer: >60 mL/min (ref >60)
GFR calc non Af Amer: 53 mL/min — ABNORMAL LOW (ref >60)
Glucose, Bld: 103 mg/dL — ABNORMAL HIGH (ref 70–99)
Potassium: 3.6 mmol/L (ref 3.5–5.1)
Sodium: 139 mmol/L (ref 135–145)
Total Bilirubin: 0.1 mg/dL — ABNORMAL LOW (ref 0.3–1.2)
Total Protein: 5.3 g/dL — ABNORMAL LOW (ref 6.5–8.1)

## 2020-04-09 LAB — GLUCOSE, CAPILLARY
Glucose-Capillary: 108 mg/dL — ABNORMAL HIGH (ref 70–99)
Glucose-Capillary: 77 mg/dL (ref 70–99)
Glucose-Capillary: 107 mg/dL — ABNORMAL HIGH (ref 70–99)
Glucose-Capillary: 107 mg/dL — ABNORMAL HIGH (ref 70–99)

## 2020-04-09 LAB — CULTURE, BLOOD (ROUTINE X 2)
Culture: NO GROWTH
Culture: NO GROWTH

## 2020-04-09 LAB — VITAMIN B1: Vitamin B1 (Thiamine): 22.9 nmol/L — ABNORMAL LOW (ref 66.5–200.0)

## 2020-04-09 MED ORDER — POTASSIUM CHLORIDE 10 MEQ/100ML IV SOLN
10.0000 meq | INTRAVENOUS | Status: AC
  Administered 2020-04-09 (×4): 10 meq via INTRAVENOUS
  Filled 2020-04-09 (×4): qty 100

## 2020-04-09 MED ORDER — ENOXAPARIN SODIUM 40 MG/0.4ML ~~LOC~~ SOLN
40.0000 mg | SUBCUTANEOUS | Status: DC
  Administered 2020-04-09 – 2020-04-16 (×8): 40 mg via SUBCUTANEOUS
  Filled 2020-04-09 (×8): qty 0.4

## 2020-04-09 MED ORDER — MAGNESIUM SULFATE 2 GM/50ML IV SOLN
2.0000 g | Freq: Once | INTRAVENOUS | Status: AC
  Administered 2020-04-09: 2 g via INTRAVENOUS
  Filled 2020-04-09: qty 50

## 2020-04-09 NOTE — Progress Notes (Signed)
CSW spoke with Tobi Bastos with the [employer] for an update on the patient. CSW provided clinical update, and Tobi Bastos will update the [employer] and get updated information to the daughter's supervisor. CSW asked about getting notification to the daughter's husband, as well, and CSW will have to initiate a new case.  CSW contacted the [employer] to initiate a new case for the daughter's spouse (patient's son-in-law):  Tamara [name], 12/10/1989 [employer] [place] Rank: EN1/E-6 Case number: [id number]  [employer] will get the information to the daughter's spouse's supervisor to determine if he can be released with the daughter to assist the patient.   CSW to follow.  Blenda Nicely, [place] Clinical Social Worker [phone]

## 2020-04-09 NOTE — Plan of Care (Signed)
  Problem: Clinical Measurements: Goal: Will remain free from infection 04/09/2020 0544 by Azzie Roup I, RN Outcome: Progressing 04/09/2020 0504 by Azzie Roup I, RN Outcome: Progressing   Problem: Clinical Measurements: Goal: Ability to maintain clinical measurements within normal limits will improve Outcome: Progressing   Problem: Clinical Measurements: Goal: Ability to maintain clinical measurements within normal limits will improve Outcome: Progressing Goal: Will remain free from infection 04/09/2020 0544 by Ashley Royalty, RN Outcome: Progressing 04/09/2020 0504 by Azzie Roup I, RN Outcome: Progressing Goal: Diagnostic test results will improve Outcome: Progressing   Problem: Health Behavior/Discharge Planning: Goal: Ability to manage health-related needs will improve 04/09/2020 0544 by Ashley Royalty, RN Outcome: Progressing 04/09/2020 0504 by Ashley Royalty, RN Outcome: Not Progressing

## 2020-04-09 NOTE — Progress Notes (Signed)
PROGRESS NOTE    Tamara [name]  MKL:[id number] DOB: 03-Dec-1960 DOA: 04/02/2020 PCP: [name], NP    Brief Narrative:  Patient admitted to the hospital with the working diagnosis of metabolic encephalopathydue to AKI, in the setting of thiamine deficiency and bilateral thalamic subacute infarcts.  59 year old female with past medical history of type 2 diabetes mellitus, hypertension, chronic kidney disease, asthma and seizures who presented with acute altered mentation. She has several weeks of worsening mental status, positive confusion and noncompliance with hermedications.On her initial physical examination blood pressure 132/89, heart rate 72, respiratory rate 13, temperature 97.4, oxygen saturation 91%. She had dry mucous membranes, her lungs were clear to auscultation bilaterally, heart S1-S2, present rhythm, soft abdomen, no lower extremity edema, she was awake, alert, andnonfocal. Sodium 153, potassium 3.4, chloride 111, bicarb 18, glucose 293, BUN 141,cr4.8 acid 4.3, hemoglobin 15.9, hemoglobin 13.9, hematocrit 44.6, platelets 445. SARS COVID-19 was negative.Urinalysis specific gravity 1.010, 21-50 epithelial cells, 11-20 white cells, 0-5 red cells. Toxicology screen negative. CT ofthe abdomen no acute changes. Chest radiograph no infiltrates. Brain MRI with diffusion hyperintensity in the posterior medial thalamus bilaterally, possible subacute infarct. EKG 112 bpm, left axis deviation, normal intervals, sinus rhythm, poor R wave progression, no ST segment or T wave changes.  Further work up with brain MRI with diffuse hyperintensity in the posterior medial thalamus bilaterally, possible infarction/ Wernicke's encephalopathy. Lumbar puncture with negative ab and 3 wbc.   Patient has been placed on high doses of thiamine. EEG with no active seizures. Follow up Brain MRI with no significant acute changes.   Her mentation continue to slowly improve, continue to be  very weak, confused and disorientated. High risk of aspiration, continue NPO but now allowed to have ice chips.    Assessment & Plan:   Principal Problem:   Acute metabolic encephalopathy Active Problems:   Asthma with acute exacerbation   Diabetes (HCC)   Acute encephalopathy   AKI (acute kidney injury) (HCC)   Protein-calorie malnutrition, severe   1. Acute metabolic encephalopathy, multifactorial. Thyroid peroxidase ab, thyroglobulin ab negative. Completed 9 doses of 500 mg IV thiamine. Today patient is awake and alert, able to respond simple questions and follow simple commands. Continue to have poor attention and disorganized thinking.   Patient able to have ice chips. Will continue supportive medical therapy with IV dextrose with IV thiamine, multivitamins and folic acid.  On neuro checks, aspiration and fall precautions. Will hold on tube feedings for now, in the setting of clinical improvement in mentation.   Plan for SNF when able to tolerate po.   2. AKI on ckd stage 3a/ hypernatremia/ hypokalemia/  hyperchloremia non anion gap metabolic acidosis. Renal function continue to improve with serum cr down to 1,15, K 3,6 and serum bicarbonate at 20 with CL at 112.   Tolerating well D5/ 0,45% NS  with vitamins at 100 ml per H. Will Add 40 meq Kcl and will repeat dose of Mg. Follow electrolytes in am.   3. Controlled T2DM Hgb A1c 6,2. Fasting glucose 103 this am.Continue with insulin sliding scale and basal insulin (glargine 10 units bid), continue with IV dextrose.   Advance diet to ice chips.   4. Asthma With current signs of acute exacerbation, continue with dulera and montelukast.  5. HTN.Echocardiogram from 10/2018 with preserved LV systolic function. Keep MAP more than 65 mmHg.   6. Severe protein calorie protein malnutrition.  Nutritional supplements ordered.    Patient continue to be at  high risk for  Worsening encephalopathy   Status is:  Inpatient  Remains inpatient appropriate because:IV treatments appropriate due to intensity of illness or inability to take PO   Dispo: The patient is from: Home              Anticipated d/c is to: SNF              Anticipated d/c date is: 3 days              Patient currently is not medically stable to d/c.    DVT prophylaxis: Enoxaparin   Code Status:    full  Family Communication:  No family at the bedside      Nutrition Status: Nutrition Problem: Severe Malnutrition Etiology: acute illness Signs/Symptoms: energy intake < or equal to 50% for > or equal to 5 days, moderate fat depletion, moderate muscle depletion, percent weight loss Percent weight loss: 21.5 % Interventions: Boost Breeze, MVI     Consultants:   Neurology    Subjective: Patient is more awake today, she is following commands and able to answer simple questions. Continue to be very confused and disorientated.   Objective: Vitals:   04/08/20 1935 04/09/20 0017 04/09/20 0353 04/09/20 0747  BP: 123/72 136/86 (!) 151/84 136/90  Pulse: 61 61 61 60  Resp: 16 16 16 18   Temp: 98.6 F (37 C) 97.8 F (36.6 C) 97.9 F (36.6 C) 97.7 F (36.5 C)  TempSrc: Oral Oral Oral Oral  SpO2: 100% 100% 100% 100%  Weight:      Height:        Intake/Output Summary (Last 24 hours) at 04/09/2020 1112 Last data filed at 04/09/2020 0600 Gross per 24 hour  Intake 1807.31 ml  Output 1751 ml  Net 56.31 ml   Filed Weights   04/02/20 2318  Weight: 54.1 kg    Examination:   General: Not in pain or dyspnea, deconditioned  Neurology: Awake and alert. Able to follow commands, continue to be confused and disorientated, lack of attention and positive disorganized thinking.  E ENT: no pallor, no icterus, oral mucosa moist Cardiovascular: No JVD. S1-S2 present, rhythmic, no gallops, rubs, or murmurs. No lower extremity edema. Pulmonary: vesicular breath sounds bilaterally, adequate air movement, no wheezing, rhonchi or  rales. Gastrointestinal. Abdomen flat, no organomegaly, non tender, no rebound or guarding Skin. No rashes Musculoskeletal: no joint deformities     Data Reviewed: I have personally reviewed following labs and imaging studies  CBC: Recent Labs  Lab 04/02/20 1514 04/02/20 1514 04/03/20 0918 04/04/20 0545 04/05/20 0311 04/06/20 0223 04/07/20 0430  WBC 15.9*   < > 13.8* 10.5 11.5* 9.4 9.1  NEUTROABS 13.0*  --   --  7.4 6.7  --   --   HGB 13.9   < > 13.7 11.6* 9.9* 9.1* 8.9*  HCT 44.6   < > 44.1 37.1 32.2* 28.4* 26.9*  MCV 91.4   < > 90.9 91.6 93.3 90.2 91.2  PLT 445*   < > 403* 308 267 261 237   < > = values in this interval not displayed.   Basic Metabolic Panel: Recent Labs  Lab 04/04/20 1846 04/04/20 1846 04/05/20 0311 04/06/20 0223 04/07/20 0430 04/08/20 0137 04/09/20 0203  NA 148*   < > 145 140 142 142 139  K 2.9*   < > 3.7 3.1* 3.1* 3.5 3.6  CL 114*   < > 111 110 112* 115* 112*  CO2 21*   < >  23 21* 20* 19* 20*  GLUCOSE 151*   < > 133* 128* 117* 115* 103*  BUN 45*   < > 40* 22* 13 8 5*  CREATININE 1.68*   < > 1.62* 1.40* 1.35* 1.22* 1.15*  CALCIUM 8.5*   < > 8.2* 8.7* 9.1 9.1 9.1  MG 2.1  --   --   --   --  1.3*  --   PHOS  --   --   --   --   --  2.0*  --    < > = values in this interval not displayed.   GFR: Estimated Creatinine Clearance: 46.1 mL/min (A) (by C-G formula based on SCr of 1.15 mg/dL (H)). Liver Function Tests: Recent Labs  Lab 04/02/20 1514 04/03/20 0918 04/04/20 1608 04/06/20 0223 04/09/20 0203  AST 24 27  --  18 23  ALT 25 23  --  14 21  ALKPHOS 76 72  --  48 49  BILITOT 1.0 0.7  --  0.4 0.1*  PROT 8.5* 8.1  --  5.5* 5.3*  ALBUMIN 4.3 4.1 3.7* 2.6* 2.5*   No results for input(s): LIPASE, AMYLASE in the last 168 hours. Recent Labs  Lab 04/02/20 1515  AMMONIA 24   Coagulation Profile: Recent Labs  Lab 04/03/20 0918  INR 1.3*   Cardiac Enzymes: No results for input(s): CKTOTAL, CKMB, CKMBINDEX, TROPONINI in the last  168 hours. BNP (last 3 results) No results for input(s): PROBNP in the last 8760 hours. HbA1C: No results for input(s): HGBA1C in the last 72 hours. CBG: Recent Labs  Lab 04/08/20 0604 04/08/20 1136 04/08/20 1606 04/08/20 2146 04/09/20 0602  GLUCAP 100* 90 79 71 108*   Lipid Profile: No results for input(s): CHOL, HDL, LDLCALC, TRIG, CHOLHDL, LDLDIRECT in the last 72 hours. Thyroid Function Tests: No results for input(s): TSH, T4TOTAL, FREET4, T3FREE, THYROIDAB in the last 72 hours. Anemia Panel: No results for input(s): VITAMINB12, FOLATE, FERRITIN, TIBC, IRON, RETICCTPCT in the last 72 hours.    Radiology Studies: I have reviewed all of the imaging during this hospital visit personally     Scheduled Meds: . feeding supplement  1 Container Oral TID BM  . heparin  5,000 Units Subcutaneous Q8H  . insulin aspart  0-15 Units Subcutaneous TID WC  . insulin glargine  10 Units Subcutaneous BID  . mometasone-formoterol  2 puff Inhalation BID   Continuous Infusions: . banana bag IV 1000 mL 100 mL/hr at 04/09/20 0442  . levETIRAcetam 500 mg (04/09/20 0948)  . magnesium sulfate bolus IVPB 2 g (04/09/20 1059)  . potassium chloride 10 mEq (04/09/20 1057)     LOS: 7 days        Nikos Anglemyer Annett Gula, MD

## 2020-04-09 NOTE — Progress Notes (Signed)
Nutrition Follow-up  DOCUMENTATION CODES:   Severe malnutrition in context of acute illness/injury  INTERVENTION:   -RD will follow for diet advancement and adjust supplement regimen as appropriate -If prolonged NPO/clear liquid diet and poor oral intake/mentation persist, recommend initiation of nutrition support:  Initiate Osmolite 1.5 @ 20 ml/hr and increase by 10 ml every 8 hours to goal rate of 50 ml/hr.   45 ml Prosource TF BID.   If no IVFS, 200 ml free water flush every 6 hours   Tube feeding regimen provides 1880 kcal (100% of needs), 97 grams of protein, and 914 ml of H2O. Total free water: 1714 ml daily  -If feedings are started, monitor K, Mg, and Phos daily and replete as needed, as pt is at high refeeding risk  NUTRITION DIAGNOSIS:   Severe Malnutrition related to acute illness as evidenced by energy intake < or equal to 50% for > or equal to 5 days, moderate fat depletion, moderate muscle depletion, percent weight loss.  Ongoing  GOAL:   Patient will meet greater than or equal to 90% of their needs  Unmet  MONITOR:   PO intake, Supplement acceptance, Diet advancement, Weight trends, Labs, I & O's  REASON FOR ASSESSMENT:   Consult Enteral/tube feeding initiation and management  ASSESSMENT:   Pt presented with acute toxic metabolic encephalopathy. PMH includes DM, HTN, CKD, asthma, recent hospitalization for unresponsiveness/AMS where pt was diagnosed with seizure disorder.  8/12- s/p BSE- recommend thin liquids  Reviewed I/O's: +168 ml x 24 hours and +2,3 L since admission  UOP: 1.8 L x 24 hours  Pr neurology notes, pt with decreased oral intake over the past month secondary to stomach pain. Intake mainly consisted of applesauce, yogurt, and liquids  Pt lying in bed at time of visit, with her eyes open. She acknowledged presence of this RD when RD greeted pt. When asked name and birthday, pt stated "my name is Tamara [name]". When asked for her  birthday, she originally replied "September", but then retracted her statement and said "August" when asked what day in September her birthday was (noted pt's birthday is [birth year]). When asked if she had a birthday coming up, pt smiled at this RD and went back to sleep.   Per SLP notes, pt refused oral intake at BSE this morning. She remains NPO.   Per discussion with cortrak tube team, cortrak order has been d/c for today.   Case discussed with Dr. Ella Jubilee, who confirms plan to hold cortrak today, as mentation and swallow are improving. Plan to advance diet as able. There is also concern that pt will remove cortrak tube if it is placed.   Pt without adequate nutrition for the past 6 hospital days.   Medications reviewed and include dextrose 5% and 0.45% NaCl 1000 ml with thiamine 100 mg, folic acid 1 mg, and MVI adult 10 ml infusion @ 100 ml/hr.   Labs reviewed: CBGS: 71-90 (inpatient orders for glycemic control are 0-15 units insulin aspart TID with meals and 10 units insulin glargine BID).   Diet Order:   Diet Order            Diet NPO time specified Except for: Sips with Meds, Ice Chips  Diet effective now                 EDUCATION NEEDS:   Not appropriate for education at this time  Skin:  Skin Assessment: Reviewed RN Assessment  Last BM:  04/08/20  Height:  Ht Readings from Last 1 Encounters:  04/02/20 5\' 7"  (1.702 m)    Weight:   Wt Readings from Last 1 Encounters:  04/02/20 54.1 kg   BMI:  Body mass index is 18.68 kg/m.  Estimated Nutritional Needs:   Kcal:  1750-1950  Protein:  90-105 grams  Fluid:  >/=1.75L/d    06/02/20, RD, LDN, CDCES Registered Dietitian II Certified Diabetes Care and Education Specialist Please refer to AMION for RD and/or RD on-call/weekend/after hours pager

## 2020-04-09 NOTE — Progress Notes (Signed)
  Speech Language Pathology Treatment: Dysphagia  Patient Details Name: Tamara [name] MRN: [id number] DOB: 11/26/1960 Today's Date: 04/09/2020 Time: 5427-0623 SLP Time Calculation (min) (ACUTE ONLY): 20 min  Assessment / Plan / Recommendation Clinical Impression  Pt seen at bedside to reassess swallow function and safety and identify least restrictive diet. Pt was awake upon arrival of SLP. Speech continues to be fully intelligible. Pt verbalized willingness to participate in oral care and PO trials, however, once SLP attempted oral care, she clamped her mouth shut and turned away. Pt refused to remove her dentures, and would not allow SLP or NT to remove them. It is highly likely that they have not been removed for the entire time she has been here, significantly increasing bacterial load and infection if aspiration occurs.  Pt did accept one piece of ice. No anterior leakage noted. Pt would not allow palpation of the laryngeal area to assess laryngeal elevation. No audible swallow noted. No overt s/s aspiration. Pt refused to accept further PO trials, even when offered different choices. She tightened her mouth closed and turned away while pushing my hand away. Attempts were discontinued at this time, to avoid increasing agitation. RN and MD informed.  Consideration of nonoral feeding is recommended given poor intake prior to and since admit. Unfortunately, it is anticipated that pt will pull out feeding tube if she allows it to be placed at all. ST will continue efforts.   HPI HPI: 59 yo female admitted 04/02/20 after a fall with [facility]. Dx metabolic encephalopathy d/t AKI, thiamine deficiency PMH: several weeks of worsening mental status, asthma, DM, HTN, CKD3a, seizure d/o, dyslipidemia, chest pain. Hospitalized 7/5/21MRI = bilateral thalamic subacute infarcts      SLP Plan  Continue with current plan of care       Recommendations  Diet recommendations: NPO Medication Administration: Via  alternative means                Oral Care Recommendations: Oral care QID Follow up Recommendations: 24 hour supervision/assistance SLP Visit Diagnosis: Dysphagia, unspecified (R13.10) Plan: Continue with current plan of care       GO              Alana Dayton B. Murvin Natal, [facility], CCC-SLP Speech Language Pathologist Office: [phone] Pager: [phone]  [name] 04/09/2020, 9:29 AM

## 2020-04-09 NOTE — Plan of Care (Signed)
  Problem: Clinical Measurements: Goal: Will remain free from infection Outcome: Progressing   Problem: Clinical Measurements: Goal: Ability to maintain clinical measurements within normal limits will improve Outcome: Progressing   Problem: Clinical Measurements: Goal: Ability to maintain clinical measurements within normal limits will improve Outcome: Progressing Goal: Will remain free from infection Outcome: Progressing

## 2020-04-10 LAB — BASIC METABOLIC PANEL
Anion gap: 6 (ref 5–15)
BUN: 5 mg/dL — ABNORMAL LOW (ref 6–20)
CO2: 21 mmol/L — ABNORMAL LOW (ref 22–32)
Calcium: 9.2 mg/dL (ref 8.9–10.3)
Chloride: 111 mmol/L (ref 98–111)
Creatinine, Ser: 1.07 mg/dL — ABNORMAL HIGH (ref 0.44–1.00)
GFR calc Af Amer: >60 mL/min (ref >60)
GFR calc non Af Amer: 58 mL/min — ABNORMAL LOW (ref >60)
Glucose, Bld: 116 mg/dL — ABNORMAL HIGH (ref 70–99)
Potassium: 3.9 mmol/L (ref 3.5–5.1)
Sodium: 138 mmol/L (ref 135–145)

## 2020-04-10 LAB — GLUCOSE, CAPILLARY
Glucose-Capillary: 113 mg/dL — ABNORMAL HIGH (ref 70–99)
Glucose-Capillary: 103 mg/dL — ABNORMAL HIGH (ref 70–99)
Glucose-Capillary: 95 mg/dL (ref 70–99)
Glucose-Capillary: 97 mg/dL (ref 70–99)

## 2020-04-10 LAB — MAGNESIUM: Magnesium: 1.8 mg/dL (ref 1.7–2.4)

## 2020-04-10 MED ORDER — MAGNESIUM SULFATE 2 GM/50ML IV SOLN
2.0000 g | Freq: Once | INTRAVENOUS | Status: AC
  Administered 2020-04-10: 2 g via INTRAVENOUS
  Filled 2020-04-10: qty 50

## 2020-04-10 NOTE — Progress Notes (Signed)
PROGRESS NOTE    Tamara [name]  CMK:[id number] DOB: 1961/05/30 DOA: 04/02/2020 PCP: [name], NP    Brief Narrative:  Patient admitted to the hospital with the working diagnosis of metabolic encephalopathydue to AKI, in the setting of thiamine deficiency and bilateral thalamic subacute infarcts.  59 year old female with past medical history of type 2 diabetes mellitus, hypertension, chronic kidney disease, asthma and seizures who presented with acute altered mentation. She has several weeks of worsening mental status, positive confusion and noncompliance with hermedications.On her initial physical examination blood pressure 132/89, heart rate 72, respiratory rate 13, temperature 97.4, oxygen saturation 91%. She had dry mucous membranes, her lungs were clear to auscultation bilaterally, heart S1-S2, present rhythm, soft abdomen, no lower extremity edema, she was awake, alert, andnonfocal. Sodium 153, potassium 3.4, chloride 111, bicarb 18, glucose 293, BUN 141,cr4.8 acid 4.3, hemoglobin 15.9, hemoglobin 13.9, hematocrit 44.6, platelets 445. SARS COVID-19 was negative.Urinalysis specific gravity 1.010, 21-50 epithelial cells, 11-20 white cells, 0-5 red cells. Toxicology screen negative. CT ofthe abdomen no acute changes. Chest radiograph no infiltrates. Brain MRI with diffusion hyperintensity in the posterior medial thalamus bilaterally, possible subacute infarct. EKG 112 bpm, left axis deviation, normal intervals, sinus rhythm, poor R wave progression, no ST segment or T wave changes.  Further work up with brain MRI with diffuse hyperintensity in the posterior medial thalamus bilaterally, possible infarction/ Wernicke's encephalopathy. Lumbar puncture with negative ab and 3 wbc.   Patient has been placed on high doses of thiamine. EEG with no active seizures. Follow up Brain MRI with no significant acute changes.   Her mentation continue to slowly improve, continue to be  very weak, confused and disorientated. High risk of aspiration, continue NPO but now allowed to have ice chips.    Assessment & Plan:   Principal Problem:   Acute metabolic encephalopathy Active Problems:   Asthma with acute exacerbation   Diabetes (HCC)   Acute encephalopathy   AKI (acute kidney injury) (HCC)   Protein-calorie malnutrition, severe   1. Acute metabolic encephalopathy, multifactorial.Thyroid peroxidase ab, thyroglobulin ab negative. Completed 9 doses of500 mg IVthiamine. This am is more somnolent than yesterday at the time of my examination. Now she is on soft wrist restrains to protect IV's.   If mentation and swallow don't improve in 24 to 48 H will need tube feedings. Continue IV multivitamins, thiamine, folic acid and dextrose.   Aspiration precautions, neuro checks and follow up with speech recommendations. Will check with Neurology if Keppra can be discontinued to prevent further somnolence.   2. AKI on ckd stage 3a/ hypernatremia/ hypokalemia/hyperchloremia non aniongap metabolic acidosis.Stable renal function with serum cr at 1,0 with K at 3,9 and serum bicarbonate at 21, Cl 111. Mg 1,8.  Continue with D5/ 0,45% NS  with vitamins at 100 ml per H. Add 2 g Mag sulfate IV. Follow up on renal function and electrolytes in am.   3. Controlled T2DM Hgb A1c 6,2. Fasting glucose 116this am.Tolerating well basal insulin, and continue with sliding scale for glucose control and monitoring   4. Asthma With signs ofacuteexacerbation,on dulera. On montelukast,   5. HTN.Echocardiogram from 10/2018 with preserved LV systolic function.blood pressure 107/82, continue to hold on antihypertensive medications.   6. Severe protein calorie protein malnutrition.Continue with nutritional supplements ordered.   Patient continue to be at high risk for worsening encephalopathy   Status is: Inpatient  Remains inpatient appropriate because:IV treatments  appropriate due to intensity of illness or inability to take PO  Dispo: The patient is from: Home              Anticipated d/c is to: SNF              Anticipated d/c date is: 2 days              Patient currently is not medically stable to d/c.     DVT prophylaxis: Enoxaparin   Code Status:   full  Family Communication:  No family at the bedside      Nutrition Status: Nutrition Problem: Severe Malnutrition Etiology: acute illness Signs/Symptoms: energy intake < or equal to 50% for > or equal to 5 days, moderate fat depletion, moderate muscle depletion, percent weight loss Percent weight loss: 21.5 % Interventions: Boost Breeze, MVI      Consultants:   Neurology     Subjective: Patient continue to be somnolent, and poorly reactive, no apparent pain, no significant po intake.   Objective: Vitals:   04/09/20 2001 04/09/20 2319 04/10/20 0335 04/10/20 0757  BP: 122/76 125/77 122/79 107/82  Pulse: 60 63 66 63  Resp: 18 18 18 18   Temp: 97.6 F (36.4 C) 97.8 F (36.6 C) 98 F (36.7 C) 97.9 F (36.6 C)  TempSrc: Oral Oral Oral Oral  SpO2: 98% 99% 98%   Weight:      Height:        Intake/Output Summary (Last 24 hours) at 04/10/2020 1000 Last data filed at 04/10/2020 0740 Gross per 24 hour  Intake 1281.19 ml  Output 1250 ml  Net 31.19 ml   Filed Weights   04/02/20 2318  Weight: 54.1 kg    Examination:   General: Not in pain or dyspnea. Deconditioned and ill looking appearing  Neurology: somnolent, poorly reactive, opens eyes to pain stimuli not following commands. Now on restrains.  E ENT: mild pallor, no icterus, oral mucosa moist Cardiovascular: No JVD. S1-S2 present, rhythmic, no gallops, rubs, or murmurs. No lower extremity edema. Pulmonary: positive breath sounds bilaterally, adequate air movement, no wheezing, rhonchi or rales. Gastrointestinal. Abdomen soft and non tender Skin. No rashes Musculoskeletal: no joint deformities     Data  Reviewed: I have personally reviewed following labs and imaging studies  CBC: Recent Labs  Lab 04/04/20 0545 04/05/20 0311 04/06/20 0223 04/07/20 0430  WBC 10.5 11.5* 9.4 9.1  NEUTROABS 7.4 6.7  --   --   HGB 11.6* 9.9* 9.1* 8.9*  HCT 37.1 32.2* 28.4* 26.9*  MCV 91.6 93.3 90.2 91.2  PLT 308 267 261 237   Basic Metabolic Panel: Recent Labs  Lab 04/04/20 1846 04/05/20 0311 04/06/20 0223 04/07/20 0430 04/08/20 0137 04/09/20 0203 04/10/20 0254  NA 148*   < > 140 142 142 139 138  K 2.9*   < > 3.1* 3.1* 3.5 3.6 3.9  CL 114*   < > 110 112* 115* 112* 111  CO2 21*   < > 21* 20* 19* 20* 21*  GLUCOSE 151*   < > 128* 117* 115* 103* 116*  BUN 45*   < > 22* 13 8 5* 5*  CREATININE 1.68*   < > 1.40* 1.35* 1.22* 1.15* 1.07*  CALCIUM 8.5*   < > 8.7* 9.1 9.1 9.1 9.2  MG 2.1  --   --   --  1.3*  --  1.8  PHOS  --   --   --   --  2.0*  --   --    < > = values in  this interval not displayed.   GFR: Estimated Creatinine Clearance: 49.5 mL/min (A) (by C-G formula based on SCr of 1.07 mg/dL (H)). Liver Function Tests: Recent Labs  Lab 04/04/20 1608 04/06/20 0223 04/09/20 0203  AST  --  18 23  ALT  --  14 21  ALKPHOS  --  48 49  BILITOT  --  0.4 0.1*  PROT  --  5.5* 5.3*  ALBUMIN 3.7* 2.6* 2.5*   No results for input(s): LIPASE, AMYLASE in the last 168 hours. No results for input(s): AMMONIA in the last 168 hours. Coagulation Profile: No results for input(s): INR, PROTIME in the last 168 hours. Cardiac Enzymes: No results for input(s): CKTOTAL, CKMB, CKMBINDEX, TROPONINI in the last 168 hours. BNP (last 3 results) No results for input(s): PROBNP in the last 8760 hours. HbA1C: No results for input(s): HGBA1C in the last 72 hours. CBG: Recent Labs  Lab 04/09/20 0602 04/09/20 1213 04/09/20 1629 04/09/20 2112 04/10/20 0603  GLUCAP 108* 77 107* 107* 113*   Lipid Profile: No results for input(s): CHOL, HDL, LDLCALC, TRIG, CHOLHDL, LDLDIRECT in the last 72 hours. Thyroid  Function Tests: No results for input(s): TSH, T4TOTAL, FREET4, T3FREE, THYROIDAB in the last 72 hours. Anemia Panel: No results for input(s): VITAMINB12, FOLATE, FERRITIN, TIBC, IRON, RETICCTPCT in the last 72 hours.    Radiology Studies: I have reviewed all of the imaging during this hospital visit personally     Scheduled Meds: . enoxaparin (LOVENOX) injection  40 mg Subcutaneous Q24H  . feeding supplement  1 Container Oral TID BM  . insulin aspart  0-15 Units Subcutaneous TID WC  . insulin glargine  10 Units Subcutaneous BID  . mometasone-formoterol  2 puff Inhalation BID   Continuous Infusions: . banana bag IV 1000 mL 100 mL/hr at 04/10/20 0549  . levETIRAcetam 500 mg (04/10/20 0921)     LOS: 8 days        Brynlee Pennywell Annett Gula, MD

## 2020-04-11 LAB — BASIC METABOLIC PANEL
Anion gap: 9 (ref 5–15)
BUN: <5 mg/dL — ABNORMAL LOW (ref 6–20)
CO2: 18 mmol/L — ABNORMAL LOW (ref 22–32)
Calcium: 9.3 mg/dL (ref 8.9–10.3)
Chloride: 112 mmol/L — ABNORMAL HIGH (ref 98–111)
Creatinine, Ser: 1.02 mg/dL — ABNORMAL HIGH (ref 0.44–1.00)
GFR calc Af Amer: >60 mL/min (ref >60)
GFR calc non Af Amer: >60 mL/min (ref >60)
Glucose, Bld: 116 mg/dL — ABNORMAL HIGH (ref 70–99)
Potassium: 3.7 mmol/L (ref 3.5–5.1)
Sodium: 139 mmol/L (ref 135–145)

## 2020-04-11 LAB — GLUCOSE, CAPILLARY
Glucose-Capillary: 120 mg/dL — ABNORMAL HIGH (ref 70–99)
Glucose-Capillary: 109 mg/dL — ABNORMAL HIGH (ref 70–99)
Glucose-Capillary: 99 mg/dL (ref 70–99)
Glucose-Capillary: 93 mg/dL (ref 70–99)

## 2020-04-11 NOTE — Progress Notes (Signed)
PROGRESS NOTE    Tamara [name]  MEQ:[id number] DOB: 1960-10-21 DOA: 04/02/2020 PCP: [name], NP    Brief Narrative:  Patient admitted to the hospital with the working diagnosis of metabolic encephalopathydue to AKI, in the setting of thiamine deficiency and bilateral thalamic subacute infarcts.   59 year old female with past medical history of type 2 diabetes mellitus, hypertension, chronic kidney disease, asthma and seizures who presented with acute altered mentation. She had several weeks of worsening mental status, positive confusion and noncompliance with hermedications.On her initial physical examination blood pressure 132/89, heart rate 72, respiratory rate 13, temperature 97.4, oxygen saturation 91%. She had dry mucous membranes, her lungs were clear to auscultation bilaterally, heart S1-S2, present rhythm, soft abdomen, no lower extremity edema, she was awake, alert, andnonfocal. Sodium 153, potassium 3.4, chloride 111, bicarb 18, glucose 293, BUN 141,cr4.8   hemoglobin 13.9, hematocrit 44.6, platelets 445. SARS COVID-19 was negative.Urinalysis specific gravity 1.010, 21-50 epithelial cells, 11-20 white cells, 0-5 red cells. Toxicology screen negative. CT ofthe abdomen no acute changes. Chest radiograph no infiltrates. EKG 112 bpm, left axis deviation, normal intervals, sinus rhythm, poor R wave progression, no ST segment or T wave changes.  Further work up with brain MRI with diffuse hyperintensity in the posterior medial thalamus bilaterally, possible infarction/ Wernicke's encephalopathy. Lumbar puncture with negative ab and only 3 wbc.   Patient was placed on high doses of thiamine. EEG with no active seizures.Follow up Brain MRI with no significant acute changes.   Her mentation continue to slowly improve, continue to be very weak, confused and disorientated. High risk of aspiration, continue NPO but now allowed to have ice chips.   Assessment & Plan:    Principal Problem:   Acute metabolic encephalopathy Active Problems:   Asthma with acute exacerbation   Diabetes (HCC)   Acute encephalopathy   AKI (acute kidney injury) (HCC)   Protein-calorie malnutrition, severe   1.Acute metabolic encephalopathy, multifactorial/ chronic seizures.Thyroid peroxidase ab, thyroglobulin ab negative.Completed 9 doses of500 mg IVthiamine. Her mentation has been fluctuant, between somnolence and fully awake, partially following commands and answering simple questions. Continue to decline eating.  Continue supportive medical therapy with IV multivitamins, thiamine, folic acid and dextrose. Patient continue to have no oral intake, no frank evidence of aspiration, but difficult to assess due to patient not willing to take po.   Will plan to place tube feedings in am, for further nutritional support, if no improvement in mentation and po intake, patient may need PEG tube.   Discussed with neurology, will continue current dose of Keppra for now.   2. AKI on ckd stage 3a/ hypernatremia/ hypokalemia/hyperchloremia with non aniongap metabolic acidosis.Renal function this am with serum cr of 1,0, K  3,7, Na at 139 and serum bicarbonate at 18, cl 112.  For now will continue with D5/ 0,45% NS with vitamins at 100 ml per H. Will plan to start tube feedings in am, will need free water flushes and will hold on Cl containing IV solutions.   3. Controlled T2DM Hgb A1c 6,2. Fasting glucose 112this am.Continue current regimen of basal insulin and sliding scale.  Patient on IV dextrose. No po intake.   4. Asthma Withactive signs ofacuteexacerbation, continue with dulera. No taking po medications (montelukast)  5. HTN.Echocardiogram from 10/2018 with preserved LV systolic function.blood pressure 107/82, controlled off antihypertensive medications.    6. Severe protein calorie protein malnutrition.Patient not taking po, will plan to place a NG tube  (cortrack) in am, for  feedings.   Patient continue to be at high risk for worsening encephalopathy   Status is: Inpatient  Remains inpatient appropriate because:IV treatments appropriate due to intensity of illness or inability to take PO   Dispo: The patient is from: Home              Anticipated d/c is to: SNF              Anticipated d/c date is: 3 days              Patient currently is not medically stable to d/c.   DVT prophylaxis: Enoxaparin   Code Status:   full  Family Communication:  No family at the bedside      Nutrition Status: Nutrition Problem: Severe Malnutrition Etiology: acute illness Signs/Symptoms: energy intake < or equal to 50% for > or equal to 5 days, moderate fat depletion, moderate muscle depletion, percent weight loss Percent weight loss: 21.5 % Interventions: Boost Breeze, MVI     Consultants:   Neurology     Subjective: Patient with fluctuant mentation, continue to decline po intake, no agitation, all information from nursing, patient not able to give any history   Objective: Vitals:   04/10/20 2038 04/10/20 2348 04/11/20 0341 04/11/20 0816  BP:  (!) 144/79 (!) 148/71 132/75  Pulse:  81 88 (!) 57  Resp:  16 18 16   Temp:  98 F (36.7 C) 98.3 F (36.8 C) 98.1 F (36.7 C)  TempSrc:  Oral Oral Oral  SpO2: 97% 98% 99% 100%  Weight:      Height:        Intake/Output Summary (Last 24 hours) at 04/11/2020 04/13/2020 Last data filed at 04/11/2020 0600 Gross per 24 hour  Intake 3401.46 ml  Output 2700 ml  Net 701.46 ml   Filed Weights   04/02/20 2318  Weight: 54.1 kg    Examination:   General: deconditioned and ill looking appearing,  Neurology: opens eyes and nods his head to voice, not following commands, not answering to simple questions, has bilateral mittens.  E ENT: no pallor, no icterus, oral mucosa moist Cardiovascular: No JVD. S1-S2 present, rhythmic, no gallops, rubs, or murmurs. No lower extremity edema. Pulmonary: positive  reath sounds bilaterally, adequate air movement, no wheezing, rhonchi or rales. Gastrointestinal. Abdomen soft and non tender Skin. No rashes Musculoskeletal: no joint deformities     Data Reviewed: I have personally reviewed following labs and imaging studies  CBC: Recent Labs  Lab 04/05/20 0311 04/06/20 0223 04/07/20 0430  WBC 11.5* 9.4 9.1  NEUTROABS 6.7  --   --   HGB 9.9* 9.1* 8.9*  HCT 32.2* 28.4* 26.9*  MCV 93.3 90.2 91.2  PLT 267 261 237   Basic Metabolic Panel: Recent Labs  Lab 04/04/20 1846 04/05/20 0311 04/07/20 0430 04/08/20 0137 04/09/20 0203 04/10/20 0254 04/11/20 0303  NA 148*   < > 142 142 139 138 139  K 2.9*   < > 3.1* 3.5 3.6 3.9 3.7  CL 114*   < > 112* 115* 112* 111 112*  CO2 21*   < > 20* 19* 20* 21* 18*  GLUCOSE 151*   < > 117* 115* 103* 116* 116*  BUN 45*   < > 13 8 5* 5* <5*  CREATININE 1.68*   < > 1.35* 1.22* 1.15* 1.07* 1.02*  CALCIUM 8.5*   < > 9.1 9.1 9.1 9.2 9.3  MG 2.1  --   --  1.3*  --  1.8  --   PHOS  --   --   --  2.0*  --   --   --    < > = values in this interval not displayed.   GFR: Estimated Creatinine Clearance: 52 mL/min (A) (by C-G formula based on SCr of 1.02 mg/dL (H)). Liver Function Tests: Recent Labs  Lab 04/04/20 1608 04/06/20 0223 04/09/20 0203  AST  --  18 23  ALT  --  14 21  ALKPHOS  --  48 49  BILITOT  --  0.4 0.1*  PROT  --  5.5* 5.3*  ALBUMIN 3.7* 2.6* 2.5*   No results for input(s): LIPASE, AMYLASE in the last 168 hours. No results for input(s): AMMONIA in the last 168 hours. Coagulation Profile: No results for input(s): INR, PROTIME in the last 168 hours. Cardiac Enzymes: No results for input(s): CKTOTAL, CKMB, CKMBINDEX, TROPONINI in the last 168 hours. BNP (last 3 results) No results for input(s): PROBNP in the last 8760 hours. HbA1C: No results for input(s): HGBA1C in the last 72 hours. CBG: Recent Labs  Lab 04/10/20 0603 04/10/20 1202 04/10/20 1555 04/10/20 2105 04/11/20 0605   GLUCAP 113* 103* 95 97 120*   Lipid Profile: No results for input(s): CHOL, HDL, LDLCALC, TRIG, CHOLHDL, LDLDIRECT in the last 72 hours. Thyroid Function Tests: No results for input(s): TSH, T4TOTAL, FREET4, T3FREE, THYROIDAB in the last 72 hours. Anemia Panel: No results for input(s): VITAMINB12, FOLATE, FERRITIN, TIBC, IRON, RETICCTPCT in the last 72 hours.    Radiology Studies: I have reviewed all of the imaging during this hospital visit personally     Scheduled Meds: . enoxaparin (LOVENOX) injection  40 mg Subcutaneous Q24H  . feeding supplement  1 Container Oral TID BM  . insulin aspart  0-15 Units Subcutaneous TID WC  . insulin glargine  10 Units Subcutaneous BID  . mometasone-formoterol  2 puff Inhalation BID   Continuous Infusions: . banana bag IV 1000 mL 100 mL/hr at 04/11/20 0343  . levETIRAcetam 500 mg (04/10/20 2200)     LOS: 9 days        Tamara Lounsbury Annett Gula, MD

## 2020-04-11 NOTE — Progress Notes (Signed)
  Speech Language Pathology Treatment: Dysphagia  Patient Details Name: Tamara [name] MRN: [id number] DOB: Nov 08, 1960 Today's Date: 04/11/2020 Time: 4970-2637 SLP Time Calculation (min) (ACUTE ONLY): 10 min  Assessment / Plan / Recommendation Clinical Impression  Pt was seen for skilled ST tx targeting PO trials.  Pt has mostly refused oral care and PO trials during her evaluation and previous tx session.  She was encountered asleep in bed with bilateral mitts in place.  Pt roused to moderate verbal and tactile stimulation and she was agreeable to this tx session.  When asked if she would like water, she responded "yes".  1/2 tsp of water was placed in her oral cavity via spoon and pt immediate expelled the bolus without attempting AP transport or swallow initiation.  Pt agreed again to another trial of water, which was presented via siphoned straw.  Pt achieved good labial closure around the spoon and accepted the water into her oral cavity; however, she again had some bolus expulsion.  Swallow initiation was achieved with this trial though, and no overt s/sx of aspiration were observed.  Recommend iniation of clear liquids (thin) with full RN supervision and close ST monitoring to hopefully help increase PO tolerance.   Suspect that pt will mostly continue to refuse PO trials and that she will require alternative means of nutrition to meet her nutritional needs.  Unfortunately, suspect that pt will refuse NG tube or will pull it out if not fully restrained given current mentation.  MD was present in the room for a portion of this tx session and he stated that there was a tentative plan to attempt to place a cortrak tomorrow with consideration of a PEG is pt does not tolerate NG tube and continues to refuse all PO. SLP will f/u closely per POC.     HPI HPI: 59 yo female admitted 04/02/20 after a fall with AMS. Dx metabolic encephalopathy d/t AKI, thiamine deficiency PMH: several weeks of worsening mental  status, asthma, DM, HTN, CKD3a, seizure d/o, dyslipidemia, chest pain. Hospitalized 7/5/21MRI = bilateral thalamic subacute infarcts      SLP Plan  Continue with current plan of care       Recommendations  Diet recommendations: Thin liquid Liquids provided via: Straw;Cup;Teaspoon Medication Administration: Via alternative means Supervision: Staff to assist with self feeding Compensations: Slow rate;Small sips/bites Postural Changes and/or Swallow Maneuvers: Seated upright 90 degrees                Oral Care Recommendations: Oral care QID Follow up Recommendations: 24 hour supervision/assistance SLP Visit Diagnosis: Dysphagia, unspecified (R13.10) Plan: Continue with current plan of care       GO               Villa Herb M.S., CCC-SLP Acute Rehabilitation Services Office: [phone]  Shanon Rosser [facility] 04/11/2020, 9:51 AM

## 2020-04-12 LAB — PHOSPHORUS
Phosphorus: 3.6 mg/dL (ref 2.5–4.6)
Phosphorus: 3.2 mg/dL (ref 2.5–4.6)

## 2020-04-12 LAB — BASIC METABOLIC PANEL
Anion gap: 11 (ref 5–15)
BUN: <5 mg/dL — ABNORMAL LOW (ref 6–20)
CO2: 19 mmol/L — ABNORMAL LOW (ref 22–32)
Calcium: 9.5 mg/dL (ref 8.9–10.3)
Chloride: 110 mmol/L (ref 98–111)
Creatinine, Ser: 1.01 mg/dL — ABNORMAL HIGH (ref 0.44–1.00)
GFR calc Af Amer: >60 mL/min (ref >60)
GFR calc non Af Amer: >60 mL/min (ref >60)
Glucose, Bld: 69 mg/dL — ABNORMAL LOW (ref 70–99)
Potassium: 3.4 mmol/L — ABNORMAL LOW (ref 3.5–5.1)
Sodium: 140 mmol/L (ref 135–145)

## 2020-04-12 LAB — GLUCOSE, CAPILLARY
Glucose-Capillary: 86 mg/dL (ref 70–99)
Glucose-Capillary: 87 mg/dL (ref 70–99)
Glucose-Capillary: 102 mg/dL — ABNORMAL HIGH (ref 70–99)
Glucose-Capillary: 98 mg/dL (ref 70–99)
Glucose-Capillary: 120 mg/dL — ABNORMAL HIGH (ref 70–99)

## 2020-04-12 LAB — MAGNESIUM
Magnesium: 1.3 mg/dL — ABNORMAL LOW (ref 1.7–2.4)
Magnesium: 2.1 mg/dL (ref 1.7–2.4)

## 2020-04-12 MED ORDER — POTASSIUM CHLORIDE 10 MEQ/100ML IV SOLN
10.0000 meq | INTRAVENOUS | Status: AC
  Administered 2020-04-12 (×4): 10 meq via INTRAVENOUS
  Filled 2020-04-12 (×4): qty 100

## 2020-04-12 MED ORDER — OSMOLITE 1.5 CAL PO LIQD
1000.0000 mL | ORAL | Status: DC
  Administered 2020-04-12 – 2020-04-22 (×6): 1000 mL
  Filled 2020-04-12 (×16): qty 1000

## 2020-04-12 MED ORDER — FREE WATER
200.0000 mL | Freq: Three times a day (TID) | Status: DC
  Administered 2020-04-12 – 2020-04-23 (×29): 200 mL

## 2020-04-12 MED ORDER — MAGNESIUM SULFATE 2 GM/50ML IV SOLN
2.0000 g | Freq: Once | INTRAVENOUS | Status: AC
  Administered 2020-04-12: 2 g via INTRAVENOUS
  Filled 2020-04-12: qty 50

## 2020-04-12 MED ORDER — PROSOURCE TF PO LIQD
45.0000 mL | Freq: Two times a day (BID) | ORAL | Status: DC
  Administered 2020-04-12 – 2020-04-23 (×20): 45 mL
  Filled 2020-04-12 (×24): qty 45

## 2020-04-12 MED ORDER — INSULIN ASPART 100 UNIT/ML ~~LOC~~ SOLN
0.0000 [IU] | Freq: Three times a day (TID) | SUBCUTANEOUS | Status: DC
  Administered 2020-04-13 – 2020-04-17 (×7): 1 [IU] via SUBCUTANEOUS
  Administered 2020-04-18: 2 [IU] via SUBCUTANEOUS
  Administered 2020-04-18 – 2020-04-23 (×6): 1 [IU] via SUBCUTANEOUS

## 2020-04-12 NOTE — Progress Notes (Signed)
Nutrition Follow-up  DOCUMENTATION CODES:   Severe malnutrition in context of acute illness/injury  INTERVENTION:  D/c Boost Breeze  Monitor magnesium, potassium, and phosphorus daily for at least 3 days, MD to replete as needed, as pt is at risk for refeeding syndrome.  Via Cortrak: -Initiate Osmolite 1.5 @ 20 ml/hr and increase by 10 ml every 8 hours to goal rate of 50 ml/hr -45 ml Prosource TF BID -200 ml free water flush every 8 hours (or per MD)  Tube feeding regimen provides 1880 kcal (100% of needs), 97 grams of protein, and 914 ml of H2O. Total free water: 1514 ml daily  NUTRITION DIAGNOSIS:   Severe Malnutrition related to acute illness as evidenced by energy intake < or equal to 50% for > or equal to 5 days, moderate fat depletion, moderate muscle depletion, percent weight loss.  Ongoing  GOAL:   Patient will meet greater than or equal to 90% of their needs  Will address with TF   MONITOR:   PO intake, Supplement acceptance, Diet advancement, Weight trends, Labs, I & O's  REASON FOR ASSESSMENT:   Consult Enteral/tube feeding initiation and management  ASSESSMENT:   Pt presented with acute toxic metabolic encephalopathy. PMH includes DM, HTN, CKD, asthma, recent hospitalization for unresponsiveness/AMS where pt was diagnosed with seizure disorder.  8/12 s/p BSE recommend thin liquids  Pt's mentation fluctuates. Pt continues to have no PO intake. Gastric Cortrak placed today. RD consulted to initiate TF.   PO Intake: 0% x 7 recorded meals  Labs: K+ 3.4 (L), CBGs 01-09-323 Medications: Boost Breeze TID, Novolog, KCl x4 IVF: D5-1/2 NS with thiamine, folic acid, MVI @ 173ml/hr  Diet Order:   Diet Order            Diet clear liquid Room service appropriate? No; Fluid consistency: Thin  Diet effective now                 EDUCATION NEEDS:   Not appropriate for education at this time  Skin:  Skin Assessment: Reviewed RN Assessment  Last BM:   8/15 type 6  Height:   Ht Readings from Last 1 Encounters:  04/02/20 5\' 7"  (1.702 m)    Weight:   Wt Readings from Last 1 Encounters:  04/02/20 54.1 kg    BMI:  Body mass index is 18.68 kg/m.  Estimated Nutritional Needs:   Kcal:  1750-1950  Protein:  90-105 grams  Fluid:  >/=1.75L/d    06/02/20, MS, RD, LDN RD pager number and weekend/on-call pager number located in Amion.

## 2020-04-12 NOTE — Progress Notes (Signed)
PROGRESS NOTE    Tamara [name]  LSL:[id number] DOB: [birth year] DOA: 04/02/2020 PCP: [name], NP    Brief Narrative:  Patient admitted to the hospital with the working diagnosis of metabolic encephalopathydue to AKI, in the setting of thiamine deficiency and bilateral thalamic subacute infarcts.   59 year old female with past medical history of type 2 diabetes mellitus, hypertension, chronic kidney disease, asthma and seizures who presented with acute altered mentation. She had several weeks of worsening mental status, positive confusion and noncompliance with hermedications.On her initial physical examination blood pressure 132/89, heart rate 72, respiratory rate 13, temperature 97.4, oxygen saturation 91%. She had dry mucous membranes, her lungs were clear to auscultation bilaterally, heart S1-S2, present rhythm, soft abdomen, no lower extremity edema, she was awake, alert, andnonfocal. Sodium 153, potassium 3.4, chloride 111, bicarb 18, glucose 293, BUN 141,cr4.8   hemoglobin 13.9, hematocrit 44.6, platelets 445. SARS COVID-19 was negative.Urinalysis specific gravity 1.010, 21-50 epithelial cells, 11-20 white cells, 0-5 red cells. Toxicology screen negative. CT ofthe abdomen no acute changes. Chest radiograph no infiltrates. EKG 112 bpm, left axis deviation, normal intervals, sinus rhythm, poor R wave progression, no ST segment or T wave changes.  Further work up with brain MRI with diffuse hyperintensity in the posterior medial thalamus bilaterally, possible infarction/ Wernicke's encephalopathy. Lumbar puncture with negative ab and only 3 wbc.   Patient was placed on high doses of thiamine. EEG with no active seizures.Follow up Brain MRI with no significant acute changes.   Her mentation continue to slowly improve, continue to be very weak, confused and disorientated. High risk of aspiration, continue NPO but now allowed to have ice chips.  Continue to be with  no significant po intake, plan to start tube feedings per NG tube and continue aspiration precautions.   Assessment & Plan:   Principal Problem:   Acute metabolic encephalopathy Active Problems:   Asthma with acute exacerbation   Diabetes (HCC)   Acute encephalopathy   AKI (acute kidney injury) (HCC)   Protein-calorie malnutrition, severe    1.Acute metabolic encephalopathy, multifactorial/ chronic seizures.Thyroid peroxidase ab, thyroglobulin ab negative.Completed 9 doses of500 mg IVthiamine.  Her mentation continue to be with no significant improvement over last 24 H, she opens her eyes and nods with her head but not following commands. Declining to eat.   Continue with IV multivitamins, thiamine, folic acid and dextrose.  Plan to place tube feedings today for further nutritional support, if no meaninful improvement in mentation and oral intake, patient likely will need PEG tube.   Continue with Keppra, no clinical signs of active seizure activity.    2. AKI on ckd stage 3a/ hypernatremia/ hypokalemia/hypomagnsemia/ hyperchloremia with non aniongap metabolic acidosis.Her renal function has been stable, continue to have hypokalemia and non gap metabolic acidosis.   On D5/ 0,45% NS with vitamins at 100 ml per H. Plan to discontinue when secure portal for enteral nutrition. Mg 1,3., continue correction with Mg sulfate 2 g IV  Check P and Mg in am.   Add 40 meq Kcl IV today and follow renal panel in am.   3. Controlled T2DM Hgb A1c 6,2. Her am glucose down to 69, capillary has been low 93, 86, 87. No oral intake and getting IV dextrose.  Discontinue basal insulin and will continue insulin sliding scale (change to insulin sensitive protocol) for glucose cover and monitoring.   4. Asthma Noacuteexacerbation, ondulera.   5. HTN.Echocardiogram from 10/2018 with preserved LV systolic function.Blood pressure continue to be  stable, 120/90 this am.  6. Severe  protein calorie protein malnutrition.No po intake, plan to get NG tube today to start tube feedings. Patient continue to decline po intake.     Patient continue to be at high risk for worsening mentation, hypoglycemia and encephalopathy   Status is: Inpatient  Remains inpatient appropriate because:IV treatments appropriate due to intensity of illness or inability to take PO   Dispo: The patient is from: Home              Anticipated d/c is to: SNF              Anticipated d/c date is: 3 days              Patient currently is not medically stable to d/c.    DVT prophylaxis: Enoxaparin   Code Status:   full  Family Communication:  No family at the bedside      Nutrition Status: Nutrition Problem: Severe Malnutrition Etiology: acute illness Signs/Symptoms: energy intake < or equal to 50% for > or equal to 5 days, moderate fat depletion, moderate muscle depletion, percent weight loss Percent weight loss: 21.5 % Interventions: Boost Breeze, MVI    Consultants:   Neurology    Subjective: Patient continue to be poorly reactive, not following commands, continue to decline po intake, no nausea or vomiting.   Objective: Vitals:   04/12/20 0006 04/12/20 0343 04/12/20 0751 04/12/20 0807  BP: 130/85 115/74 120/90   Pulse: 63 60 (!) 59   Resp: 16 17 18    Temp: 98 F (36.7 C) 97.9 F (36.6 C) 97.7 F (36.5 C)   TempSrc: Oral Oral Axillary   SpO2: 100% 100% 100% 98%  Weight:      Height:        Intake/Output Summary (Last 24 hours) at 04/12/2020 04/14/2020 Last data filed at 04/12/2020 0600 Gross per 24 hour  Intake 2170.89 ml  Output 1675 ml  Net 495.89 ml   Filed Weights   04/02/20 2318  Weight: 54.1 kg    Examination:   General: Not in pain or dyspnea, deconditioned  Neurology: opens eyes and nods with her head to touch but not following commands E ENT: no pallor, no icterus, oral mucosa moist Cardiovascular: No JVD. S1-S2 present, rhythmic, no gallops, rubs, or  murmurs. No lower extremity edema. Pulmonary: positive breath sounds bilaterally Gastrointestinal. Abdomen soft and non tender Skin. No rashes Musculoskeletal: no joint deformities     Data Reviewed: I have personally reviewed following labs and imaging studies  CBC: Recent Labs  Lab 04/06/20 0223 04/07/20 0430  WBC 9.4 9.1  HGB 9.1* 8.9*  HCT 28.4* 26.9*  MCV 90.2 91.2  PLT 261 237   Basic Metabolic Panel: Recent Labs  Lab 04/08/20 0137 04/09/20 0203 04/10/20 0254 04/11/20 0303 04/12/20 0231  NA 142 139 138 139 140  K 3.5 3.6 3.9 3.7 3.4*  CL 115* 112* 111 112* 110  CO2 19* 20* 21* 18* 19*  GLUCOSE 115* 103* 116* 116* 69*  BUN 8 5* 5* <5* <5*  CREATININE 1.22* 1.15* 1.07* 1.02* 1.01*  CALCIUM 9.1 9.1 9.2 9.3 9.5  MG 1.3*  --  1.8  --   --   PHOS 2.0*  --   --   --   --    GFR: Estimated Creatinine Clearance: 52.5 mL/min (A) (by C-G formula based on SCr of 1.01 mg/dL (H)). Liver Function Tests: Recent Labs  Lab 04/06/20 0223 04/09/20 04/11/20  AST 18 23  ALT 14 21  ALKPHOS 48 49  BILITOT 0.4 0.1*  PROT 5.5* 5.3*  ALBUMIN 2.6* 2.5*   No results for input(s): LIPASE, AMYLASE in the last 168 hours. No results for input(s): AMMONIA in the last 168 hours. Coagulation Profile: No results for input(s): INR, PROTIME in the last 168 hours. Cardiac Enzymes: No results for input(s): CKTOTAL, CKMB, CKMBINDEX, TROPONINI in the last 168 hours. BNP (last 3 results) No results for input(s): PROBNP in the last 8760 hours. HbA1C: No results for input(s): HGBA1C in the last 72 hours. CBG: Recent Labs  Lab 04/11/20 1200 04/11/20 1704 04/11/20 2106 04/12/20 0402 04/12/20 0750  GLUCAP 109* 99 93 86 87   Lipid Profile: No results for input(s): CHOL, HDL, LDLCALC, TRIG, CHOLHDL, LDLDIRECT in the last 72 hours. Thyroid Function Tests: No results for input(s): TSH, T4TOTAL, FREET4, T3FREE, THYROIDAB in the last 72 hours. Anemia Panel: No results for input(s):  VITAMINB12, FOLATE, FERRITIN, TIBC, IRON, RETICCTPCT in the last 72 hours.    Radiology Studies: I have reviewed all of the imaging during this hospital visit personally     Scheduled Meds: . enoxaparin (LOVENOX) injection  40 mg Subcutaneous Q24H  . feeding supplement  1 Container Oral TID BM  . insulin aspart  0-15 Units Subcutaneous TID WC  . insulin glargine  10 Units Subcutaneous BID  . mometasone-formoterol  2 puff Inhalation BID   Continuous Infusions: . banana bag IV 1000 mL 100 mL/hr at 04/12/20 0330  . levETIRAcetam 500 mg (04/11/20 2157)     LOS: 10 days        Mauricio Annett Gula, MD

## 2020-04-12 NOTE — Procedures (Signed)
Cortrak  Person Inserting Tube:  Tamara [name], RD Tube Type:  Cortrak - 43 inches Tube Location:  Left nare Initial Placement:  Stomach Secured by: Bridle Technique Used to Measure Tube Placement:  Documented cm marking at nare/ corner of mouth Cortrak Secured At:  62 cm    Cortrak Tube Team Note:  Consult received to place a Cortrak feeding tube.   No x-ray is required. RN may begin using tube.   If the tube becomes dislodged please keep the tube and contact the Cortrak team at [email or web address] ([email or web address]) for replacement.  If after hours and replacement cannot be delayed, place a NG tube and confirm placement with an abdominal x-ray.    Cammy Copa., RD, LDN, CNSC See AMiON for contact information

## 2020-04-12 NOTE — Progress Notes (Signed)
Physical Therapy Treatment Patient Details Name: Tamara [name] MRN: [id number] DOB: 1960-09-06 Today's Date: 04/12/2020    History of Present Illness 59 y.o. female with medical history significant for dm, htn, ckd, asthma, recent hospitalization (07/21) for unresponsiveness/AMS and diagnosed w/ seizure disorder and started on anticonvulsant. Presented to ED 04/02/20 with AMS after several weeks of poor oral intake. Found to have AKI and metabolic encephalopathy. Negative extended EEG, toxicology, CSF. MRI brain- Hyperintensity in the posteromedial thalami bilaterally (possibly infarction vs Wernicke's encephalopathy); low thiamine level    PT Comments    Patient more alert once seated EOB. Despite being told multiple times that she was in [facility], she could not repeat this information when asked. She could state her name and DOB and did follow most simple commands (including standing with +2 min assist and transfer to the chair).     Follow Up Recommendations  SNF;Supervision/Assistance - 24 hour     Equipment Recommendations  Rolling walker with 5" wheels;3in1 (PT)    Recommendations for Other Services       Precautions / Restrictions Precautions Precautions: Fall    Mobility  Bed Mobility Overal bed mobility: Needs Assistance Bed Mobility: Supine to Sit     Supine to sit: +2 for physical assistance;Total assist;HOB elevated        Transfers Overall transfer level: Needs assistance Equipment used:  (bed pad under pelvis; 2 person assist) Transfers: Sit to/from [employer] Sit to Stand: Min assist;+2 safety/equipment (x 2 reps; back on her heels) Stand pivot transfers: Min assist;+2 physical assistance;+2 safety/equipment       General transfer comment: appeared would need incr assist due to decr arousal, however once more alert, stood with min assist, then able to weight shift and step around to chair  Ambulation/Gait                  Stairs             Wheelchair Mobility    Modified Rankin (Stroke Patients Only)       Balance Overall balance assessment: Needs assistance Sitting-balance support: No upper extremity supported;Feet supported Sitting balance-Leahy Scale: Poor Sitting balance - Comments: could maintain for up to 30 sec without assist, but will then lean too far forward, overcorrects and loses balance posteriorly requiring min assist   Standing balance support: During functional activity;No upper extremity supported Standing balance-Leahy Scale: Poor Standing balance comment: posterior lean                            Cognition Arousal/Alertness: Lethargic Behavior During Therapy: Flat affect Overall Cognitive Status: Difficult to assess Area of Impairment: Orientation;Attention;Following commands                 Orientation Level: Disoriented to;Place;Time;Situation (could state DOB) Current Attention Level: Focused;Sustained Memory: Decreased short-term memory;Decreased recall of precautions Following Commands: Follows one step commands inconsistently Safety/Judgement: Decreased awareness of deficits;Decreased awareness of safety Awareness: Intellectual Problem Solving: Difficulty sequencing;Requires verbal cues;Requires tactile cues;Slow processing;Decreased initiation General Comments: initially difficult to arouse (in supine); once seated EOB, more alert and able to follow one-step commands for automatic movements "stand up"      Exercises General Exercises - Lower Extremity Long Arc Quad: AROM;Both;5 reps;Seated    General Comments        Pertinent Vitals/Pain Pain Assessment: Faces Faces Pain Scale: No hurt    Home Living  Prior Function            PT Goals (current goals can now be found in the care plan section) Acute Rehab PT Goals Patient Stated Goal: patient unable to participate in goal setting Time For Goal  Achievement: 04/21/20 Potential to Achieve Goals: Fair Progress towards PT goals: Progressing toward goals    Frequency    Min 2X/week      PT Plan Current plan remains appropriate    Co-evaluation              AM-PAC PT "6 Clicks" Mobility   Outcome Measure  Help needed turning from your back to your side while in a flat bed without using bedrails?: Total Help needed moving from lying on your back to sitting on the side of a flat bed without using bedrails?: Total Help needed moving to and from a bed to a chair (including a wheelchair)?: A Lot Help needed standing up from a chair using your arms (e.g., wheelchair or bedside chair)?: A Little Help needed to walk in hospital room?: A Lot Help needed climbing 3-5 steps with a railing? : Total 6 Click Score: 10    End of Session Equipment Utilized During Treatment: Gait belt Activity Tolerance: Patient limited by lethargy Patient left: with call bell/phone within reach;in chair;with chair alarm set Nurse Communication: Mobility status PT Visit Diagnosis: Unsteadiness on feet (R26.81);Other abnormalities of gait and mobility (R26.89);Other symptoms and signs involving the nervous system (R29.898)     Time: 5093-2671 PT Time Calculation (min) (ACUTE ONLY): 20 min  Charges:  $Therapeutic Activity: 8-22 mins                      Jerolyn Center, PT Pager [phone]    Tamara [name] 04/12/2020, 1:14 PM

## 2020-04-12 NOTE — Progress Notes (Signed)
  Speech Language Pathology Treatment: Dysphagia  Patient Details Name: Tamara [name] MRN: [id number] DOB: 1960/10/16 Today's Date: 04/12/2020 Time: 4401-0272 SLP Time Calculation (min) (ACUTE ONLY): 25 min  Assessment / Plan / Recommendation Clinical Impression  Patient seen by SLP to address dysphagia goals. She was asleep but awakened without difficulty, though appeared lethargic and drowsy throughout session. Patient would nod head when asked if she wanted PO's (water, ginger ale, applesauce, grape juice, ice chip, jello) but only accepted a very tiny amount of water from teaspoon during initial trials. No active manipulation or swallow observed. Patient would continue to nod head when asked if she would try boluses, but would turn head away and keep lips closed. Patient directed to hold cup of water and she started to bring to lips but did not complete this action and SLP eventually took cup from her. Patient commented that the water "doesn't smell right" and juice, applesauce, jello "smells too strong". Patient would not allow for oral care, even when SLP placed toothette brush in her hand. During majority of refusals, patient initially would start to move towards spoon or cup, but then would turn away before it got to her lips. Breakfast tray was on tray table but nothing had been consumed. Patient's current cognitive impairment is the main impediment in her progress with oral intake at this point and will likely need alternative form of nutrition.   HPI HPI: 59 yo female admitted 04/02/20 after a fall with AMS. Dx metabolic encephalopathy d/t AKI, thiamine deficiency PMH: several weeks of worsening mental status, asthma, DM, HTN, CKD3a, seizure d/o, dyslipidemia, chest pain. Hospitalized 7/5/21MRI = bilateral thalamic subacute infarcts      SLP Plan  Continue with current plan of care       Recommendations  Diet recommendations: Thin liquid Liquids provided via:  Cup;Straw;Teaspoon Medication Administration: Via alternative means Supervision: Staff to assist with self feeding Postural Changes and/or Swallow Maneuvers: Seated upright 90 degrees                Oral Care Recommendations: Oral care QID Follow up Recommendations: 24 hour supervision/assistance;Skilled Nursing facility SLP Visit Diagnosis: Dysphagia, unspecified (R13.10) Plan: Continue with current plan of care       GO                Angela Nevin, MA, CCC-SLP Speech Therapy [facility] Acute Rehab Pager: [phone]

## 2020-04-13 LAB — GLUCOSE, CAPILLARY
Glucose-Capillary: 106 mg/dL — ABNORMAL HIGH (ref 70–99)
Glucose-Capillary: 109 mg/dL — ABNORMAL HIGH (ref 70–99)
Glucose-Capillary: 112 mg/dL — ABNORMAL HIGH (ref 70–99)
Glucose-Capillary: 123 mg/dL — ABNORMAL HIGH (ref 70–99)
Glucose-Capillary: 125 mg/dL — ABNORMAL HIGH (ref 70–99)
Glucose-Capillary: 129 mg/dL — ABNORMAL HIGH (ref 70–99)
Glucose-Capillary: 139 mg/dL — ABNORMAL HIGH (ref 70–99)

## 2020-04-13 LAB — BASIC METABOLIC PANEL
Anion gap: 8 (ref 5–15)
BUN: 7 mg/dL (ref 6–20)
CO2: 21 mmol/L — ABNORMAL LOW (ref 22–32)
Calcium: 9.2 mg/dL (ref 8.9–10.3)
Chloride: 111 mmol/L (ref 98–111)
Creatinine, Ser: 1.06 mg/dL — ABNORMAL HIGH (ref 0.44–1.00)
GFR calc Af Amer: >60 mL/min (ref >60)
GFR calc non Af Amer: 58 mL/min — ABNORMAL LOW (ref >60)
Glucose, Bld: 153 mg/dL — ABNORMAL HIGH (ref 70–99)
Potassium: 4.2 mmol/L (ref 3.5–5.1)
Sodium: 140 mmol/L (ref 135–145)

## 2020-04-13 LAB — PHOSPHORUS
Phosphorus: 3.9 mg/dL (ref 2.5–4.6)
Phosphorus: 3.4 mg/dL (ref 2.5–4.6)

## 2020-04-13 LAB — MAGNESIUM
Magnesium: 1.8 mg/dL (ref 1.7–2.4)
Magnesium: 2.1 mg/dL (ref 1.7–2.4)

## 2020-04-13 MED ORDER — MAGNESIUM SULFATE 2 GM/50ML IV SOLN
2.0000 g | Freq: Once | INTRAVENOUS | Status: AC
  Administered 2020-04-13: 2 g via INTRAVENOUS
  Filled 2020-04-13: qty 50

## 2020-04-13 MED ORDER — LEVETIRACETAM 100 MG/ML PO SOLN
500.0000 mg | Freq: Two times a day (BID) | ORAL | Status: DC
  Administered 2020-04-13 – 2020-04-16 (×6): 500 mg
  Filled 2020-04-13 (×7): qty 5

## 2020-04-13 MED ORDER — ELDERTONIC PO LIQD
15.0000 mL | Freq: Every day | ORAL | Status: DC
  Administered 2020-04-13 – 2020-04-16 (×4): 15 mL
  Filled 2020-04-13 (×4): qty 15

## 2020-04-13 MED ORDER — THIAMINE HCL 100 MG PO TABS
100.0000 mg | ORAL_TABLET | Freq: Every day | ORAL | Status: DC
  Administered 2020-04-13 – 2020-04-16 (×4): 100 mg
  Filled 2020-04-13 (×4): qty 1

## 2020-04-13 MED ORDER — FOLIC ACID 1 MG PO TABS
1.0000 mg | ORAL_TABLET | Freq: Every day | ORAL | Status: DC
  Administered 2020-04-13 – 2020-04-16 (×4): 1 mg
  Filled 2020-04-13 (×4): qty 1

## 2020-04-13 NOTE — Progress Notes (Signed)
PROGRESS NOTE    Tamara [name]  FGH:[id number] DOB: 1961-02-08 DOA: 04/02/2020 PCP: [name], NP    Brief Narrative:  Patient admitted to the hospital with the working diagnosis of metabolic encephalopathydue to AKI, in the setting of thiamine deficiency and bilateral thalamic subacute infarcts.   59 year old female with past medical history of type 2 diabetes mellitus, hypertension, chronic kidney disease, asthma and seizures who presented with acute altered mentation. She hadseveral weeks of worsening mental status, positive confusion and noncompliance with hermedications.On her initial physical examination blood pressure 132/89, heart rate 72, respiratory rate 13, temperature 97.4, oxygen saturation 91%. She had dry mucous membranes, her lungs were clear to auscultation bilaterally, heart S1-S2, present rhythm, soft abdomen, no lower extremity edema, she was awake, alert, andnonfocal. Sodium 153, potassium 3.4, chloride 111, bicarb 18, glucose 293, BUN 141,cr4.8 hemoglobin 13.9, hematocrit 44.6, platelets 445. SARS COVID-19 was negative.Urinalysis specific gravity 1.010, 21-50 epithelial cells, 11-20 white cells, 0-5 red cells. Toxicology screen negative. CT ofthe abdomen no acute changes. Chest radiograph no infiltrates. EKG 112 bpm, left axis deviation, normal intervals, sinus rhythm, poor R wave progression, no ST segment or T wave changes.  Further work up with brain MRI with diffuse hyperintensity in the posterior medial thalamus bilaterally, possible infarction/ Wernicke's encephalopathy. Lumbar puncture with negative ab andonly3 wbc.   Patientwasplaced on high doses of thiamine. EEG with no active seizures.Follow up Brain MRI with no significant acute changes.   Her mentation continue to slowly improve, continue to be very weak, confused and disorientated. High risk of aspiration, continue NPO but now allowed to have ice chips.  Continue to be with  no significant po intake, ng tube has been placed and tube feedings started.   Assessment & Plan:   Principal Problem:   Acute metabolic encephalopathy Active Problems:   Asthma with acute exacerbation   Diabetes (HCC)   Acute encephalopathy   AKI (acute kidney injury) (HCC)   Protein-calorie malnutrition, severe    1.Acute metabolic encephalopathy, multifactorial/ chronic seizures.Thyroid peroxidase ab, thyroglobulin ab negative.Completed 9 doses of500 mg IVthiamine.  Tube feedings have been started with good toleration, this am she is more reactive and last night she eat some jelly. This am she is following simple commands, but not answering to simple questions.  She continue to decline po intake.   Will transition to thiamine, multivitamins and folic acid per NG tube, will hold on IV fluids for now.  Continue with clear diet as tolerated.   Per neurology recommendations will continue with Keppra. Change to per tube. No clinical signs of active seizures.   2. AKI on ckd stage 3a/ hypernatremia/ hypokalemia/hypomagnsemia/ hyperchloremiawithnon aniongap metabolic acidosis. Stable renal function with serum cr at 1,0, K is 4,2 and serum bicarbonate is 21, cl 111. Mg 1,8.   Will discontinue IV fluids and will continue hydration per NG tube. Add 2 g Mag sulfate this am. Continue to follow renal function and electrolytes in am.   3. Controlled T2DM Hgb A1c 6,2. Improved glucose up to 153 this am. Tolerating well tube feedings.  Continue to hold on basal insulin for now, continue with insulin sliding scale for glucose cover and monitoring.   4. Asthma Nosigns of acuteexacerbation,continue with ondulera.  5. HTN.Echocardiogram from 10/2018 with preserved LV systolic function.Holding antihypertensive medications at this point, her blood pressure continue to be well controlled.   6. Severe protein calorie protein malnutrition.Patient continue to decline po  intake, now on tube feedings. Her swallow mechanism  seems to be adequate. Possible neuropsychiatric process in place.  If patient continue to decline po intake, may need PEG tube.    Status is: Inpatient  Remains inpatient appropriate because:Inpatient level of care appropriate due to severity of illness   Dispo: The patient is from: Home              Anticipated d/c is to: SNF              Anticipated d/c date is: 3 days              Patient currently is not medically stable to d/c.   DVT prophylaxis: Enoxaparin   Code Status:   full  Family Communication:  No family at the bedside      Nutrition Status: Nutrition Problem: Severe Malnutrition Etiology: acute illness Signs/Symptoms: energy intake < or equal to 50% for > or equal to 5 days, moderate fat depletion, moderate muscle depletion, percent weight loss Percent weight loss: 21.5 % Interventions: Boost Breeze, MVI     Skin Documentation:     Consultants:   Neurology   Procedures:   Ng tube for tube feedings   Subjective: Patient continue to be hyporeactive, this am is able to follow simple commands, still not willing to eat. Overall more awake than yesterday.   Objective: Vitals:   04/13/20 0345 04/13/20 0458 04/13/20 0736 04/13/20 0816  BP: 102/67   121/70  Pulse: 67  70 67  Resp: 16  16 16   Temp: 98.3 F (36.8 C)   97.7 F (36.5 C)  TempSrc:    Axillary  SpO2: 100%  100% 100%  Weight:  50.3 kg    Height:        Intake/Output Summary (Last 24 hours) at 04/13/2020 0829 Last data filed at 04/13/2020 0818 Gross per 24 hour  Intake 2568.34 ml  Output 4275 ml  Net -1706.66 ml   Filed Weights   04/02/20 2318 04/13/20 0458  Weight: 54.1 kg 50.3 kg    Examination:   General: deconditioned  Neurology: Eyes open and following simple commands, not verbal to my examination but she has talked to nursing today. Not in apparent pain or dyspnea.  E ENT: no pallor, no icterus, oral mucosa  moist Cardiovascular: No JVD. S1-S2 present, rhythmic, no gallops, rubs, or murmurs. No lower extremity edema. Pulmonary: positive reath sounds bilaterally Gastrointestinal. Abdomen soft and non tender Musculoskeletal: no joint deformities     Data Reviewed: I have personally reviewed following labs and imaging studies  CBC: Recent Labs  Lab 04/07/20 0430  WBC 9.1  HGB 8.9*  HCT 26.9*  MCV 91.2  PLT 237   Basic Metabolic Panel: Recent Labs  Lab 04/08/20 0137 04/08/20 0137 04/09/20 0203 04/10/20 0254 04/11/20 0303 04/12/20 0231 04/12/20 1408 04/12/20 1822 04/13/20 0428  NA 142   < > 139 138 139 140  --   --  140  K 3.5   < > 3.6 3.9 3.7 3.4*  --   --  4.2  CL 115*   < > 112* 111 112* 110  --   --  111  CO2 19*   < > 20* 21* 18* 19*  --   --  21*  GLUCOSE 115*   < > 103* 116* 116* 69*  --   --  153*  BUN 8   < > 5* 5* <5* <5*  --   --  7  CREATININE 1.22*   < > 1.15* 1.07*  1.02* 1.01*  --   --  1.06*  CALCIUM 9.1   < > 9.1 9.2 9.3 9.5  --   --  9.2  MG 1.3*  --   --  1.8  --   --  1.3* 2.1 1.8  PHOS 2.0*  --   --   --   --   --  3.6 3.2 3.9   < > = values in this interval not displayed.   GFR: Estimated Creatinine Clearance: 46.5 mL/min (A) (by C-G formula based on SCr of 1.06 mg/dL (H)). Liver Function Tests: Recent Labs  Lab 04/09/20 0203  AST 23  ALT 21  ALKPHOS 49  BILITOT 0.1*  PROT 5.3*  ALBUMIN 2.5*   No results for input(s): LIPASE, AMYLASE in the last 168 hours. No results for input(s): AMMONIA in the last 168 hours. Coagulation Profile: No results for input(s): INR, PROTIME in the last 168 hours. Cardiac Enzymes: No results for input(s): CKTOTAL, CKMB, CKMBINDEX, TROPONINI in the last 168 hours. BNP (last 3 results) No results for input(s): PROBNP in the last 8760 hours. HbA1C: No results for input(s): HGBA1C in the last 72 hours. CBG: Recent Labs  Lab 04/12/20 1645 04/12/20 1957 04/13/20 0006 04/13/20 0344 04/13/20 0815  GLUCAP 98  120* 123* 129* 139*   Lipid Profile: No results for input(s): CHOL, HDL, LDLCALC, TRIG, CHOLHDL, LDLDIRECT in the last 72 hours. Thyroid Function Tests: No results for input(s): TSH, T4TOTAL, FREET4, T3FREE, THYROIDAB in the last 72 hours. Anemia Panel: No results for input(s): VITAMINB12, FOLATE, FERRITIN, TIBC, IRON, RETICCTPCT in the last 72 hours.    Radiology Studies: I have reviewed all of the imaging during this hospital visit personally     Scheduled Meds: . enoxaparin (LOVENOX) injection  40 mg Subcutaneous Q24H  . feeding supplement (PROSource TF)  45 mL Per Tube BID  . free water  200 mL Per Tube Q8H  . insulin aspart  0-9 Units Subcutaneous TID WC  . mometasone-formoterol  2 puff Inhalation BID   Continuous Infusions: . banana bag IV 1000 mL 100 mL/hr at 04/13/20 0607  . feeding supplement (OSMOLITE 1.5 CAL) 40 mL/hr at 04/13/20 0640  . levETIRAcetam Stopped (04/12/20 2217)     LOS: 11 days        Jeb Schloemer Annett Gula, MD

## 2020-04-13 NOTE — Progress Notes (Signed)
  Speech Language Pathology Treatment: Dysphagia  Patient Details Name: Tamara [name] MRN: [id number] DOB: 1961/03/28 Today's Date: 04/13/2020 Time: 1696-7893 SLP Time Calculation (min) (ACUTE ONLY): 20 min  Assessment / Plan / Recommendation Clinical Impression  Pt was seen at bedside for continued trials of PO diet. Pt continues to refuse to remove dentures, and will not allow them to be removed. She also refuses thorough oral care. Pt did accept a few boluses of puree and thin liquid today. No obvious oral issues and no overt s/s aspiration observed. Will advance diet to puree - hopefully pt will be more willing to accept advanced textures. She has not exhibited difficulty with PO intake per RN, however, she will not accept foods requiring mastication. ST will continue to follow.    HPI HPI: 59 yo female admitted 04/02/20 after a fall with AMS. Dx metabolic encephalopathy d/t AKI, thiamine deficiency PMH: several weeks of worsening mental status, asthma, DM, HTN, CKD3a, seizure d/o, dyslipidemia, chest pain. Hospitalized 7/5/21MRI = bilateral thalamic subacute infarcts      SLP Plan  Continue with current plan of care  Advance diet to puree/thin       Recommendations  Diet recommendations:  (full liquids) Liquids provided via: Cup;Straw;Teaspoon Medication Administration: Via alternative means Supervision: Staff to assist with self feeding Compensations: Slow rate;Small sips/bites Postural Changes and/or Swallow Maneuvers: Seated upright 90 degrees                Oral Care Recommendations: Oral care QID;Other (Comment) (try to remove dentures to clean them!!) Follow up Recommendations: 24 hour supervision/assistance;Skilled Nursing facility SLP Visit Diagnosis: Dysphagia, unspecified (R13.10) Plan: Continue with current plan of care       GO               Tamara [name] B. Murvin Natal, [facility], CCC-SLP Speech Language Pathologist Office: [phone]  Tamara [name] 04/13/2020,  12:37 PM

## 2020-04-13 NOTE — Progress Notes (Signed)
Occupational Therapy Treatment Patient Details Name: Tamara [name] MRN: [id number] DOB: 1961/07/13 Today's Date: 04/13/2020    History of present illness 59 y.o. female with medical history significant for dm, htn, ckd, asthma, recent hospitalization (07/21) for unresponsiveness/AMS and diagnosed w/ seizure disorder and started on anticonvulsant. Presented to ED 04/02/20 with AMS after several weeks of poor oral intake. Found to have AKI and metabolic encephalopathy. Negative extended EEG, toxicology, CSF. MRI brain- Hyperintensity in the posteromedial thalami bilaterally (possibly infarction vs Wernicke's encephalopathy); low thiamine level   OT comments  Patient continues to make steady progress towards goals in skilled OT session. Patient's session encompassed neuromuscular re-ed and ADLs at EOB in order to progress in overall independence and activity tolerance. Pt with increased ability to answer simple questions in session, however continues to require a choice of two for orientation questions (other than name and DOB) with 25% accuracy. Pt with increased ability to motor plan sitting EOB however continues to demonstrate significant posterior lean. Pt able to hold balance for approximately 30 prior to requiring intervention by therapist (min A). Pt complaining of being hot at EOB, unable to check BP at the time, and posterior lean worsening therefore transferring was deferred. Discharge remains appropriate; will continue to follow acutely.    Follow Up Recommendations  SNF;Supervision/Assistance - 24 hour    Equipment Recommendations  Other (comment) (Defer to next venue)    Recommendations for Other Services      Precautions / Restrictions Precautions Precautions: Fall Restrictions Weight Bearing Restrictions: No       Mobility Bed Mobility Overal bed mobility: Needs Assistance Bed Mobility: Supine to Sit     Supine to sit: Mod assist     General bed mobility comments:  increased cues to sequence  Transfers                 General transfer comment: deferred due to pt stating she was hot, could not follow commands as consistently, and significant posterior lean    Balance Overall balance assessment: Needs assistance Sitting-balance support: No upper extremity supported;Feet supported Sitting balance-Leahy Scale: Poor Sitting balance - Comments: could maintain for up to 30 sec without assist, but will then lean too far forward, overcorrects and loses balance posteriorly requiring min assist   Standing balance support: During functional activity;No upper extremity supported Standing balance-Leahy Scale: Poor Standing balance comment: posterior lean                           ADL either performed or assessed with clinical judgement   ADL Overall ADL's : Needs assistance/impaired                                     Functional mobility during ADLs: Moderate assistance;+2 for physical assistance;+2 for safety/equipment General ADL Comments: Able to complete at EOB with increased multi-modal cues     Vision       Perception     Praxis      Cognition Arousal/Alertness: Awake/alert Behavior During Therapy: WFL for tasks assessed/performed Overall Cognitive Status: Difficult to assess Area of Impairment: Orientation;Attention;Following commands                 Orientation Level: Disoriented to;Place;Time;Situation Current Attention Level: Focused;Sustained Memory: Decreased short-term memory;Decreased recall of precautions Following Commands: Follows one step commands inconsistently Safety/Judgement: Decreased awareness of deficits;Decreased awareness of safety  Awareness: Intellectual Problem Solving: Difficulty sequencing;Requires verbal cues;Requires tactile cues;Slow processing;Decreased initiation General Comments: More participatory upon arrival, and could correctly place 1x with a choice of two,  however when nurse entered she was unable to state again        Exercises     Shoulder Instructions       General Comments      Pertinent Vitals/ Pain       Pain Assessment: Faces Faces Pain Scale: Hurts a little bit Pain Location: grimaces with movement Pain Descriptors / Indicators: Grimacing;Discomfort;Guarding Pain Intervention(s): Limited activity within patient's tolerance;Monitored during session;Repositioned  Home Living                                          Prior Functioning/Environment              Frequency  Min 2X/week        Progress Toward Goals  OT Goals(current goals can now be found in the care plan section)  Progress towards OT goals: Progressing toward goals  Acute Rehab OT Goals Patient Stated Goal: patient unable to participate in goal setting OT Goal Formulation: Patient unable to participate in goal setting Time For Goal Achievement: 04/22/20 Potential to Achieve Goals: Good  Plan Discharge plan remains appropriate    Co-evaluation                 AM-PAC OT "6 Clicks" Daily Activity     Outcome Measure   Help from another person eating meals?: A Lot Help from another person taking care of personal grooming?: A Lot Help from another person toileting, which includes using toliet, bedpan, or urinal?: A Lot Help from another person bathing (including washing, rinsing, drying)?: A Lot Help from another person to put on and taking off regular upper body clothing?: A Lot Help from another person to put on and taking off regular lower body clothing?: A Lot 6 Click Score: 12    End of Session    OT Visit Diagnosis: Other abnormalities of gait and mobility (R26.89);Muscle weakness (generalized) (M62.81);Other symptoms and signs involving cognitive function;Cognitive communication deficit (R41.841)   Activity Tolerance Patient tolerated treatment well;Patient limited by fatigue   Patient Left in bed;with call  bell/phone within reach;with bed alarm set   Nurse Communication Mobility status;Other (comment) (may be experiencing orthostatic BPs)        Time: 8502-7741 OT Time Calculation (min): 29 min  Charges: OT General Charges $OT Visit: 1 Visit OT Treatments $Self Care/Home Management : 8-22 mins $Neuromuscular Re-education: 8-22 mins  Pollyann Glen E. Kemontae Dunklee, COTA/L Acute Rehabilitation Services [phone]   Cherlyn Cushing 04/13/2020, 3:58 PM

## 2020-04-14 DIAGNOSIS — R4182 Altered mental status, unspecified: Secondary | ICD-10-CM

## 2020-04-14 LAB — COMPREHENSIVE METABOLIC PANEL
ALT: 24 U/L (ref 0–44)
AST: 16 U/L (ref 15–41)
Albumin: 2.6 g/dL — ABNORMAL LOW (ref 3.5–5.0)
Alkaline Phosphatase: 58 U/L (ref 38–126)
Anion gap: 6 (ref 5–15)
BUN: 9 mg/dL (ref 6–20)
CO2: 24 mmol/L (ref 22–32)
Calcium: 9.4 mg/dL (ref 8.9–10.3)
Chloride: 109 mmol/L (ref 98–111)
Creatinine, Ser: 0.93 mg/dL (ref 0.44–1.00)
GFR calc Af Amer: >60 mL/min (ref >60)
GFR calc non Af Amer: >60 mL/min (ref >60)
Glucose, Bld: 94 mg/dL (ref 70–99)
Potassium: 4.2 mmol/L (ref 3.5–5.1)
Sodium: 139 mmol/L (ref 135–145)
Total Bilirubin: 0.2 mg/dL — ABNORMAL LOW (ref 0.3–1.2)
Total Protein: 5.6 g/dL — ABNORMAL LOW (ref 6.5–8.1)

## 2020-04-14 LAB — GLUCOSE, CAPILLARY
Glucose-Capillary: 122 mg/dL — ABNORMAL HIGH (ref 70–99)
Glucose-Capillary: 123 mg/dL — ABNORMAL HIGH (ref 70–99)
Glucose-Capillary: 132 mg/dL — ABNORMAL HIGH (ref 70–99)
Glucose-Capillary: 99 mg/dL (ref 70–99)
Glucose-Capillary: 128 mg/dL — ABNORMAL HIGH (ref 70–99)
Glucose-Capillary: 129 mg/dL — ABNORMAL HIGH (ref 70–99)

## 2020-04-14 NOTE — Progress Notes (Signed)
PROGRESS NOTE  Tamara [name] IZT:[id number] DOB: 07/28/1961 DOA: 04/02/2020 PCP: Tamara Sharps, NP   LOS: 12 days   Brief Narrative / Interim history: 59 year old female with DM 2, hypertension, chronic kidney disease, asthma, seizure came in with confusion, worsening mental status, noncompliance with her medication, with symptoms progressing over the last several weeks.  Neurology has been consulted as well.  MRI of the brain showed diffuse hyperintensity in the posterior medial thalamus bilaterally, possible infarction/Wernicke's encephalopathy.  Lumbar puncture unremarkable.  She was empirically placed on high-dose thiamine.  EEG did not show any active seizures.  She was found to have thiamine deficiency.  Subjective / 24h Interval events: Appears slightly confused this morning but alert, no specific complaints.  Has an NG tube.  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  Per neurology, may not fully recover given the extent of nutritional brain injury.  She completed high-dose thiamine for 3 days, now continue thiamine supplementation daily.  Per neurology recommendations continue Keppra.  Active Problems Acute kidney injury on chronic kidney disease stage IIIa / hypernatremia / hypokalemia / hypomagnesemia / hyperchloremia with non-anion gap metabolic acidosis -kidney function has remained stable  Severe protein calorie malnutrition-continue NG tube for now, will monitor over the next day or 2 and see if she is increasing her p.o. intake.  If she is unable to maintain her nutritional needs will need a PEG tube.  Will discuss with daughter.  History of asthma-no wheezing, continue Dulera  Essential hypertension-hold home medications as her blood pressure is normal.  DM2, controlled-A1c 6.2.  She is tolerating well the tube feeds.  Continue  sliding scale  CBG (last 3)  Recent Labs    04/14/20 0324 04/14/20 0704 04/14/20 0746  GLUCAP 122* 123* 132*    Scheduled Meds: . enoxaparin (LOVENOX) injection  40 mg Subcutaneous Q24H  . feeding supplement (PROSource TF)  45 mL Per Tube BID  . folic acid  1 mg Per Tube Daily  . free water  200 mL Per Tube Q8H  . geriatric multivitamins-minerals  15 mL Per Tube Daily  . insulin aspart  0-9 Units Subcutaneous TID WC  . levETIRAcetam  500 mg Per Tube BID  . mometasone-formoterol  2 puff Inhalation BID  . thiamine  100 mg Per Tube Daily   Continuous Infusions: . feeding supplement (OSMOLITE 1.5 CAL) Stopped (04/13/20 2355)   PRN Meds:.albuterol  Diet Orders (From admission, onward)    Start     Ordered   04/13/20 1248  DIET - DYS 1 Room service appropriate? No; Fluid consistency: Thin  Diet effective now       Question Answer Comment  Room service appropriate? No   Fluid consistency: Thin      04/13/20 1247          DVT prophylaxis: enoxaparin (LOVENOX) injection 40 mg Start: 04/09/20 1230     Code Status: Full Code  Family Communication: Daughter is apparently on the way flying from [place], will discuss when she arrives  Status is: Inpatient  Remains inpatient appropriate because:Inpatient level of care appropriate due to severity of illness   Dispo: The patient is from: Home              Anticipated d/c is to: SNF              Anticipated d/c date is: 3 days  Patient currently is not medically stable to d/c.  Consultants:  Neurology   Procedures:  None   Microbiology  None   Antimicrobials: None     Objective: Vitals:   04/13/20 2025 04/13/20 2342 04/14/20 0326 04/14/20 0811  BP:  106/70 113/76 125/82  Pulse:  75 79 80  Resp:  15 18 16   Temp:  98.3 F (36.8 C) 98 F (36.7 C) 98.5 F (36.9 C)  TempSrc:  Oral Oral Oral  SpO2: 100% 99% 100% 100%  Weight:      Height:        Intake/Output Summary (Last 24 hours) at  04/14/2020 1001 Last data filed at 04/14/2020 0900 Gross per 24 hour  Intake 0 ml  Output 1675 ml  Net -1675 ml   Filed Weights   04/02/20 2318 04/13/20 0458  Weight: 54.1 kg 50.3 kg    Examination:  Constitutional: NAD Eyes: no scleral icterus ENMT: Mucous membranes are moist.  Neck: normal, supple Respiratory: clear to auscultation bilaterally, no wheezing, no crackles. Cardiovascular: Regular rate and rhythm, no murmurs / rubs / gallops.  Abdomen: non distended, no tenderness. Bowel sounds positive.  Musculoskeletal: no clubbing / cyanosis.  Skin: no rashes Neurologic: Does not follow commands consistently   Data Reviewed: I have independently reviewed following labs and imaging studies   CBC: No results for input(s): WBC, NEUTROABS, HGB, HCT, MCV, PLT in the last 168 hours. Basic Metabolic Panel: Recent Labs  Lab 04/08/20 0137 04/09/20 0203 04/10/20 0254 04/11/20 0303 04/12/20 0231 04/12/20 1408 04/12/20 1822 04/13/20 0428 04/13/20 1641 04/14/20 0141  NA 142   < > 138 139 140  --   --  140  --  139  K 3.5   < > 3.9 3.7 3.4*  --   --  4.2  --  4.2  CL 115*   < > 111 112* 110  --   --  111  --  109  CO2 19*   < > 21* 18* 19*  --   --  21*  --  24  GLUCOSE 115*   < > 116* 116* 69*  --   --  153*  --  94  BUN 8   < > 5* <5* <5*  --   --  7  --  9  CREATININE 1.22*   < > 1.07* 1.02* 1.01*  --   --  1.06*  --  0.93  CALCIUM 9.1   < > 9.2 9.3 9.5  --   --  9.2  --  9.4  MG 1.3*  --  1.8  --   --  1.3* 2.1 1.8 2.1  --   PHOS 2.0*  --   --   --   --  3.6 3.2 3.9 3.4  --    < > = values in this interval not displayed.   Liver Function Tests: Recent Labs  Lab 04/09/20 0203 04/14/20 0141  AST 23 16  ALT 21 24  ALKPHOS 49 58  BILITOT 0.1* 0.2*  PROT 5.3* 5.6*  ALBUMIN 2.5* 2.6*   Coagulation Profile: No results for input(s): INR, PROTIME in the last 168 hours. HbA1C: No results for input(s): HGBA1C in the last 72 hours. CBG: Recent Labs  Lab  04/13/20 1944 04/13/20 2342 04/14/20 0324 04/14/20 0704 04/14/20 0746  GLUCAP 109* 125* 122* 123* 132*    Recent Results (from the past 240 hour(s))  CSF culture     Status: None  Collection Time: 04/04/20  3:48 PM   Specimen: CSF; Cerebrospinal Fluid  Result Value Ref Range Status   Specimen Description CSF  Final   Special Requests NONE  Final   Gram Stain   Final    WBC PRESENT, PREDOMINANTLY MONONUCLEAR NO ORGANISMS SEEN CYTOSPIN SMEAR    Culture   Final    NO GROWTH 3 DAYS Performed at [facility], [address], [place], [place] [address]    Report Status 04/07/2020 FINAL  Final  Culture, blood (routine x 2)     Status: None   Collection Time: 04/04/20  6:21 PM   Specimen: BLOOD  Result Value Ref Range Status   Specimen Description BLOOD LEFT ANTECUBITAL  Final   Special Requests   Final    BOTTLES DRAWN AEROBIC AND ANAEROBIC Blood Culture results may not be optimal due to an inadequate volume of blood received in culture bottles   Culture   Final    NO GROWTH 5 DAYS Performed at [facility], [address] [place], [place] [address]    Report Status 04/09/2020 FINAL  Final  Culture, blood (routine x 2)     Status: None   Collection Time: 04/04/20  6:43 PM   Specimen: BLOOD  Result Value Ref Range Status   Specimen Description BLOOD BLOOD LEFT HAND  Final   Special Requests   Final    BOTTLES DRAWN AEROBIC AND ANAEROBIC Blood Culture results may not be optimal due to an inadequate volume of blood received in culture bottles   Culture   Final    NO GROWTH 5 DAYS Performed at [facility], [address] [place], [place] [address]    Report Status 04/09/2020 FINAL  Final     Radiology Studies: No results found.  Tamara Pert, MD, PhD [facility]  Between 7 am - 7 pm I am available, please contact me via Amion or Securechat  Between 7 pm - 7 am I am not available, please contact night coverage MD/APP via Amion

## 2020-04-15 LAB — GLUCOSE, CAPILLARY
Glucose-Capillary: 102 mg/dL — ABNORMAL HIGH (ref 70–99)
Glucose-Capillary: 105 mg/dL — ABNORMAL HIGH (ref 70–99)
Glucose-Capillary: 128 mg/dL — ABNORMAL HIGH (ref 70–99)
Glucose-Capillary: 136 mg/dL — ABNORMAL HIGH (ref 70–99)
Glucose-Capillary: 138 mg/dL — ABNORMAL HIGH (ref 70–99)
Glucose-Capillary: 157 mg/dL — ABNORMAL HIGH (ref 70–99)
Glucose-Capillary: 161 mg/dL — ABNORMAL HIGH (ref 70–99)

## 2020-04-15 NOTE — Progress Notes (Addendum)
PROGRESS NOTE  Tamara [name] ZJQ:[id number] DOB: 28-Feb-1961 DOA: 04/02/2020 PCP: [name], NP   LOS: 13 days   Brief Narrative / Interim history: 59 year old female with DM 2, hypertension, chronic kidney disease, asthma, seizure came in with confusion, worsening mental status, noncompliance with her medication, with symptoms progressing over the last several weeks.  Neurology has been consulted as well.  MRI of the brain showed diffuse hyperintensity in the posterior medial thalamus bilaterally, possible infarction/Wernicke's encephalopathy.  Lumbar puncture unremarkable.  She was empirically placed on high-dose thiamine.  EEG did not show any active seizures.  She was found to have thiamine deficiency.  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  Per neurology, may not fully recover given the extent of nutritional brain injury.  She completed high-dose thiamine for 3 days, now continue thiamine supplementation daily.  Per neurology recommendations continue Keppra.  Active Problems Acute kidney injury on chronic kidney disease stage IIIa / hypernatremia / hypokalemia / hypomagnesemia / hyperchloremia with non-anion gap metabolic acidosis -kidney function has remained stable  Severe protein calorie malnutrition-continue NG tube for now, unfortunately she has little to no p.o. intake and is solely reliant on the feeding tube.  Daughter apparently is flying from [place] as we speak, will discuss with her regarding best next steps but we are looking at a feeding tube for long-term nutrition  History of asthma-no wheezing, continue [facility]  Essential hypertension-hold home medications as her blood pressure is normal.  DM2, controlled-A1c 6.2.  CBGs fairly good,  continue sliding scale  CBG (last 3)  Recent Labs    04/15/20 0327 04/15/20 0640 04/15/20 0820  GLUCAP 161* 138* 157*    Scheduled Meds: . enoxaparin (LOVENOX) injection  40 mg Subcutaneous Q24H  . feeding supplement (PROSource TF)  45 mL Per Tube BID  . folic acid  1 mg Per Tube Daily  . free water  200 mL Per Tube Q8H  . geriatric multivitamins-minerals  15 mL Per Tube Daily  . insulin aspart  0-9 Units Subcutaneous TID WC  . levETIRAcetam  500 mg Per Tube BID  . mometasone-formoterol  2 puff Inhalation BID  . thiamine  100 mg Per Tube Daily   Continuous Infusions: . feeding supplement (OSMOLITE 1.5 CAL) Stopped (04/13/20 2355)   PRN Meds:.albuterol  Diet Orders (From admission, onward)    Start     Ordered   04/13/20 1248  DIET - DYS 1 Room service appropriate? No; Fluid consistency: Thin  Diet effective now       Question Answer Comment  Room service appropriate? No   Fluid consistency: Thin      04/13/20 1247          DVT prophylaxis: enoxaparin (LOVENOX) injection 40 mg Start: 04/09/20 1230     Code Status: Full Code  Family Communication: Daughter [name], [phone]  Status is: Inpatient  Remains inpatient appropriate because:Inpatient level of care appropriate due to severity of illness   Dispo: The patient is from: Home              Anticipated d/c is to: SNF              Anticipated d/c date is: 3 days              Patient currently is not medically stable to  d/c.  Consultants:  Neurology   Procedures:  None   Microbiology  None   Antimicrobials: None     Objective: Vitals:   04/14/20 2020 04/14/20 2325 04/15/20 0331 04/15/20 0823  BP:  104/82 122/89 (!) (P) 113/57  Pulse:  80 85 (P) 81  Resp:  17 19 (P) 18  Temp:  98.8 F (37.1 C) 98.3 F (36.8 C) (P) 98.5 F (36.9 C)  TempSrc:  Oral Oral (P) Oral  SpO2: 98% 100% 100% (P) 100%  Weight:      Height:        Intake/Output Summary (Last 24 hours) at 04/15/2020  0943 Last data filed at 04/15/2020 0836 Gross per 24 hour  Intake 10 ml  Output 1400 ml  Net -1390 ml   Filed Weights   04/02/20 2318 04/13/20 0458  Weight: 54.1 kg 50.3 kg    Examination:  Constitutional: No distress Eyes: No icterus ENMT: Moist membranes Neck: normal, supple Respiratory: Clear bilaterally, no wheezing or crackles heard Cardiovascular: Regular rate and rhythm, no murmurs Abdomen: Soft, nontender, nondistended, bowel sounds positive Musculoskeletal: no clubbing / cyanosis.  Skin: No rashes appreciated Neurologic: Nonfocal   Data Reviewed: I have independently reviewed following labs and imaging studies   CBC: No results for input(s): WBC, NEUTROABS, HGB, HCT, MCV, PLT in the last 168 hours. Basic Metabolic Panel: Recent Labs  Lab 04/10/20 0254 04/11/20 0303 04/12/20 0231 04/12/20 1408 04/12/20 1822 04/13/20 0428 04/13/20 1641 04/14/20 0141  NA 138 139 140  --   --  140  --  139  K 3.9 3.7 3.4*  --   --  4.2  --  4.2  CL 111 112* 110  --   --  111  --  109  CO2 21* 18* 19*  --   --  21*  --  24  GLUCOSE 116* 116* 69*  --   --  153*  --  94  BUN 5* <5* <5*  --   --  7  --  9  CREATININE 1.07* 1.02* 1.01*  --   --  1.06*  --  0.93  CALCIUM 9.2 9.3 9.5  --   --  9.2  --  9.4  MG 1.8  --   --  1.3* 2.1 1.8 2.1  --   PHOS  --   --   --  3.6 3.2 3.9 3.4  --    Liver Function Tests: Recent Labs  Lab 04/09/20 0203 04/14/20 0141  AST 23 16  ALT 21 24  ALKPHOS 49 58  BILITOT 0.1* 0.2*  PROT 5.3* 5.6*  ALBUMIN 2.5* 2.6*   Coagulation Profile: No results for input(s): INR, PROTIME in the last 168 hours. HbA1C: No results for input(s): HGBA1C in the last 72 hours. CBG: Recent Labs  Lab 04/14/20 1613 04/14/20 1941 04/15/20 0327 04/15/20 0640 04/15/20 0820  GLUCAP 128* 129* 161* 138* 157*    No results found for this or any previous visit (from the past 240 hour(s)).   Radiology Studies: No results found.  Pamella Pert, MD,  PhD [facility]  Between 7 am - 7 pm I am available, please contact me via Amion or Securechat  Between 7 pm - 7 am I am not available, please contact night coverage MD/APP via Amion

## 2020-04-15 NOTE — Progress Notes (Signed)
Request to IR for possible g-tube placement - patient anatomy on CT abd/pelvis w/o contrast dated 04/02/20 approved by Dr. Fredia Sorrow.  Will plan for g-tube placement next week (likely Tuesday/Wednesday). IR PA to see for consult/consent.  Please call with any questions or concerns.  Lynnette Caffey, PA-C

## 2020-04-15 NOTE — Progress Notes (Signed)
  Speech Language Pathology Treatment: Dysphagia  Patient Details Name: Tamara [name] MRN: [id number] DOB: 12/23/1960 Today's Date: 04/15/2020 Time: 8841-6606 SLP Time Calculation (min) (ACUTE ONLY): 17 min  Assessment / Plan / Recommendation Clinical Impression  Pt was seen for dysphagia treatment. She was alert throughout the session and cooperative, but required encouragement to accept trials. Pt expressed that she would prefer foods which she has to masticate. Her dentures were not in place and requested that they not be placed. She tolerated puree solids and dysphagia 2 solids without overt s/sx of aspiration. Mastication time was [facility] despite edentulous status and no significant oral residue was noted. Pt refused all liquids and any other additional boluses of solids. However, she indicated that she would like to attempt a more advanced diet. A dysphagia 2 diet with thin liquids is recommended at this time. SLP will continue to follow pt.    HPI HPI: 59 yo female admitted 04/02/20 after a fall with AMS. Dx metabolic encephalopathy d/t AKI, thiamine deficiency PMH: several weeks of worsening mental status, asthma, DM, HTN, CKD3a, seizure d/o, dyslipidemia, chest pain. Hospitalized 7/5/21MRI = bilateral thalamic subacute infarcts      SLP Plan  Continue with current plan of care       Recommendations  Diet recommendations: Dysphagia 2 (fine chop);Thin liquid Liquids provided via: Cup;Straw Medication Administration: Via alternative means Supervision: Staff to assist with self feeding Compensations: Slow rate;Small sips/bites Postural Changes and/or Swallow Maneuvers: Seated upright 90 degrees                Oral Care Recommendations: Oral care BID Follow up Recommendations: 24 hour supervision/assistance;Skilled Nursing facility SLP Visit Diagnosis: Dysphagia, unspecified (R13.10) Plan: Continue with current plan of care       Tamara [name] I. Vear Clock, MS, CCC-SLP Acute  Rehabilitation Services Office number [phone] Pager [phone]                Tamara [name] 04/15/2020, 2:06 PM

## 2020-04-15 NOTE — Progress Notes (Signed)
Physical Therapy Treatment Patient Details Name: Tamara [name] MRN: [id number] DOB: [birth year] Today's Date: 04/15/2020    History of Present Illness 59 y.o. female with medical history significant for dm, htn, ckd, asthma, recent hospitalization (07/21) for unresponsiveness/AMS and diagnosed w/ seizure disorder and started on anticonvulsant. Presented to ED 04/02/20 with AMS after several weeks of poor oral intake. Found to have AKI and metabolic encephalopathy. Negative extended EEG, toxicology, CSF. MRI brain- Hyperintensity in the posteromedial thalami bilaterally (possibly infarction vs Wernicke's encephalopathy); low thiamine level    PT Comments    Patient with limited ability to answer questions today and not interactive verbally. Patient able to follow simple cues for sequencing mobility and requires increased time for processing. She required mod assist to bring LE's off EOB and had decreased initiation this session for all mobility. Mod assist to raise trunk up and intermittent min assist to steady self at EOB. Pt able to complete 1x sit<>stand and stand step transfer with RW to move to recliner. Pt was unable to complete additional sit<>stand from recliner as she could not follow cues safely for power up. Current discharge plan remains appropriate, pt will benefit from continued skilled PT. Acute PT will progress as able.   Follow Up Recommendations  SNF;Supervision/Assistance - 24 hour     Equipment Recommendations  Rolling walker with 5" wheels;3in1 (PT)    Recommendations for Other Services       Precautions / Restrictions Precautions Precautions: Fall Restrictions Weight Bearing Restrictions: No    Mobility  Bed Mobility Overal bed mobility: Needs Assistance Bed Mobility: Supine to Sit     Supine to sit: Mod assist;HOB elevated     General bed mobility comments: pt requried significant verbal/tactile cues to bring LE's off EOB and to reach Rt UE to Lt bed rail.  Mod assist required to raise trunk.   Transfers Overall transfer level: Needs assistance Equipment used: Rolling walker (2 wheeled) Transfers: Sit to/from [employer] Sit to Stand: Mod assist;From elevated surface;Min assist Stand pivot transfers: Min assist;Mod assist       General transfer comment: Mod assist to initiate power up and rise from EOB to RW, min assist to steady in standing and with small steps to move to recliner. Attempted additional Sit<>Stand in recliner for LE strengthening however pt fatigued and unable to follow multimodal cues for technique with power up using bil UE on armrest.   Ambulation/Gait          Stairs   Wheelchair Mobility    Modified Rankin (Stroke Patients Only)       Balance Overall balance assessment: Needs assistance Sitting-balance support: No upper extremity supported;Feet supported;Bilateral upper extremity supported Sitting balance-Leahy Scale: Poor Sitting balance - Comments: pt able to maintain seated balance without UE support for ~20 seconds and then would rest UE on bed or bed rail. min assist intermittently to steady   Standing balance support: During functional activity;No upper extremity supported Standing balance-Leahy Scale: Poor Standing balance comment: heavily reliant on external support during stand step transfer         Cognition Arousal/Alertness: Awake/alert Behavior During Therapy: Flat affect Overall Cognitive Status: Impaired/Different from baseline Area of Impairment: Orientation;Attention;Following commands                 Orientation Level: Disoriented to;Place;Time;Situation     Following Commands: Follows one step commands inconsistently;Follows one step commands with increased time Safety/Judgement: Decreased awareness of deficits;Decreased awareness of safety   Problem Solving: Slow  processing;Decreased initiation;Difficulty sequencing;Requires verbal cues;Requires tactile  cues General Comments: pt not very participatory with answering questions and making minimal verbal comments during session, responded to name but did not state.       Exercises      General Comments        Pertinent Vitals/Pain Pain Assessment: No/denies pain Faces Pain Scale: No hurt Pain Intervention(s): Monitored during session           PT Goals (current goals can now be found in the care plan section) Acute Rehab PT Goals Patient Stated Goal: patient unable to participate in goal setting PT Goal Formulation: With patient Time For Goal Achievement: 04/21/20 Potential to Achieve Goals: Fair Progress towards PT goals: Progressing toward goals    Frequency    Min 2X/week      PT Plan Current plan remains appropriate    Co-evaluation              AM-PAC PT "6 Clicks" Mobility   Outcome Measure  Help needed turning from your back to your side while in a flat bed without using bedrails?: A Lot Help needed moving from lying on your back to sitting on the side of a flat bed without using bedrails?: A Lot Help needed moving to and from a bed to a chair (including a wheelchair)?: A Lot Help needed standing up from a chair using your arms (e.g., wheelchair or bedside chair)?: A Lot Help needed to walk in hospital room?: A Lot Help needed climbing 3-5 steps with a railing? : Total 6 Click Score: 11    End of Session Equipment Utilized During Treatment: Gait belt Activity Tolerance: Patient tolerated treatment well Patient left: in chair;with call bell/phone within reach;with chair alarm set Nurse Communication: Mobility status PT Visit Diagnosis: Unsteadiness on feet (R26.81);Other abnormalities of gait and mobility (R26.89);Other symptoms and signs involving the nervous system (I94.854)     Time: 6270-3500 PT Time Calculation (min) (ACUTE ONLY): 29 min  Charges:  $Therapeutic Activity: 23-37 mins                     Wynn Maudlin, DPT Acute  Rehabilitation Services  Office [phone] Pager [phone]  04/15/2020 2:12 PM

## 2020-04-15 NOTE — NC FL2 (Signed)
[place] MEDICAID FL2 LEVEL OF CARE SCREENING TOOL     IDENTIFICATION  Patient Name: Tamara [name] Birthdate: 03/15/1961 Sex: female Admission Date (Current Location): 04/02/2020  [facility] and [place] Number:  Producer, television/film/video and Address:  [facility], [address], [place], [place] [address]      Provider Number: [id number]  Attending Physician Name and Address:  Leatha Gilding, MD  Relative Name and Phone Number:       Current Level of Care: Hospital Recommended Level of Care: Skilled Nursing Facility Prior Approval Number:    Date Approved/Denied:   PASRR Number: [id number]  Discharge Plan: SNF    Current Diagnoses: Patient Active Problem List   Diagnosis Date Noted  . Protein-calorie malnutrition, severe 04/05/2020  . AKI (acute kidney injury) (HCC) 04/02/2020  . Acute metabolic encephalopathy 04/02/2020  . Acute encephalopathy 03/01/2020  . Asthma 03/01/2020  . Dehydration 03/01/2020  . Acute kidney injury superimposed on CKD (HCC) 03/01/2020  . Hyperammonemia (HCC) 03/01/2020  . Elevated serum hCG 03/01/2020  . Hypermagnesemia 03/01/2020  . Fall 03/01/2020  . Vomiting 03/01/2020  . Essential hypertension 03/01/2020  . AMS (altered mental status) 03/01/2020  . Chest pain 11/03/2018  . Asthma with acute exacerbation 01/14/2013  . Diabetes (HCC) 01/14/2013  . Dyslipidemia 01/14/2013    Orientation RESPIRATION BLADDER Height & Weight     Self  Normal Incontinent Weight: 110 lb 14.3 oz (50.3 kg) Height:  5\' 7"  (170.2 cm)  BEHAVIORAL SYMPTOMS/MOOD NEUROLOGICAL BOWEL NUTRITION STATUS      Incontinent Feeding tube (Osmolite 1.5)  AMBULATORY STATUS COMMUNICATION OF NEEDS Skin   Extensive Assist Non-Verbally Normal                       Personal Care Assistance Level of Assistance  Bathing, Feeding, Dressing Bathing Assistance: Maximum assistance Feeding assistance: Maximum assistance Dressing Assistance: Maximum  assistance     Functional Limitations Info  Speech     Speech Info: Impaired    SPECIAL CARE FACTORS FREQUENCY  PT (By licensed PT), OT (By licensed OT), Speech therapy     PT Frequency: 5x/wk OT Frequency: 5x/wk     Speech Therapy Frequency: 5x/wk      Contractures Contractures Info: Not present    Additional Factors Info  Code Status, Allergies, Insulin Sliding Scale Code Status Info: Full Allergies Info: Chlorhexidine   Insulin Sliding Scale Info: 0-9 units 3x/day with meals       Current Medications (04/15/2020):  This is the current hospital active medication list Current Facility-Administered Medications  Medication Dose Route Frequency Provider Last Rate Last Admin  . albuterol (PROVENTIL) (2.5 MG/3ML) 0.083% nebulizer solution 2.5 mg  2.5 mg Nebulization Q6H PRN Wouk, 04/17/2020, MD      . enoxaparin (LOVENOX) injection 40 mg  40 mg Subcutaneous Q24H Wilfred Curtis, MD   40 mg at 04/15/20 1347  . feeding supplement (OSMOLITE 1.5 CAL) liquid 1,000 mL  1,000 mL Per Tube Continuous Arrien, 04/17/20, MD   Stopped at 04/13/20 2355  . feeding supplement (PROSource TF) liquid 45 mL  45 mL Per Tube BID Arrien, 2356, MD   45 mL at 04/15/20 0934  . folic acid (FOLVITE) tablet 1 mg  1 mg Per Tube Daily Arrien, 04/17/20, MD   1 mg at 04/15/20 0930  . free water 200 mL  200 mL Per Tube Q8H Arrien, 04/17/20, MD   200  mL at 04/15/20 1348  . geriatric multivitamins-minerals (ELDERTONIC/GEVRABON) liquid 15 mL  15 mL Per Tube Daily Arrien, York Ram, MD   15 mL at 04/15/20 0934  . insulin aspart (novoLOG) injection 0-9 Units  0-9 Units Subcutaneous TID WC Arrien, York Ram, MD   1 Units at 04/15/20 [id number]  . levETIRAcetam (KEPPRA) 100 MG/ML solution 500 mg  500 mg Per Tube BID Arrien, York Ram, MD   500 mg at 04/15/20 0928  . mometasone-formoterol (DULERA) 200-5 MCG/ACT inhaler 2 puff  2 puff Inhalation BID Kathrynn Running, MD   2 puff at 04/15/20 0859  . thiamine tablet 100 mg  100 mg Per Tube Daily Arrien, York Ram, MD   100 mg at 04/15/20 3662     Discharge Medications: Please see discharge summary for a list of discharge medications.  Relevant Imaging Results:  Relevant Lab Results:   Additional Information SS#: [id number]  Baldemar Lenis, LCSW

## 2020-04-15 NOTE — TOC Initial Note (Signed)
Transition of Care [facility]) - Initial/Assessment Note    Patient Details  Name: Tamara [name] MRN: [id number] Date of Birth: Apr 16, 1961  [facility] CM/SW Contact:    Geralynn Ochs, LCSW Phone Number:  04/15/2020, 4:40 PM  Clinical Narrative:    CSW met with patient's daughter, Tamara [name], at bedside, who was given approval to come home from the [place]. CSW discussed discharge needs, and Tamara [name] is agreeable to SNF placement. CSW to fax out referral. Patient will need peg placed prior to DC to SNF, daughter is aware. CSW to provide CMS choice.               Expected Discharge Plan: [place] Barriers to Discharge: Inadequate or no insurance, Continued Medical Work up   Patient Goals and CMS Choice Patient states their goals for this hospitalization and ongoing recovery are:: patient unable to participate in goal setting due to disorientation CMS Medicare.gov Compare Post Acute Care list provided to:: Patient Represenative (must comment) Choice offered to / list presented to : Adult Children  Expected Discharge Plan and Services Expected Discharge Plan: [place] Choice: [place] Living arrangements for the past 2 months: Apartment                                      Prior Living Arrangements/Services Living arrangements for the past 2 months: Apartment Lives with:: Self Patient language and need for interpreter reviewed:: No Do you feel safe going back to the place where you live?: Yes      Need for Family Participation in Patient Care: Yes (Comment) Care giver support system in place?: No (comment)   Criminal Activity/Legal Involvement Pertinent to Current Situation/Hospitalization: No - Comment as needed  Activities of Daily Living      Permission Sought/Granted Permission sought to share information with : Facility Sport and exercise psychologist, Family Supports Permission granted to share  information with : Yes, Verbal Permission Granted  Share Information with NAME: Tamara [name]  Permission granted to share info w AGENCY: SNF  Permission granted to share info w Relationship: Daughter     Emotional Assessment   Attitude/Demeanor/Rapport: Unable to Assess Affect (typically observed): Unable to Assess Orientation: : Oriented to Self      Admission diagnosis:  Dehydration [E86.0] AKI (acute kidney injury) ([place]) [N17.9] Altered mental status, unspecified altered mental status type [D63.87] Acute metabolic encephalopathy [F64.33] Patient Active Problem List   Diagnosis Date Noted   Protein-calorie malnutrition, severe 04/05/2020   AKI (acute kidney injury) ([place]) 29/51/8841   Acute metabolic encephalopathy 66/01/3015   Acute encephalopathy 03/01/2020   Asthma 03/01/2020   Dehydration 03/01/2020   Acute kidney injury superimposed on CKD ([place]) 03/01/2020   Hyperammonemia ([place]) 03/01/2020   Elevated serum hCG 03/01/2020   Hypermagnesemia 03/01/2020   Fall 03/01/2020   Vomiting 03/01/2020   Essential hypertension 03/01/2020   AMS (altered mental status) 03/01/2020   Chest pain 11/03/2018   Asthma with acute exacerbation 01/14/2013   Diabetes ([place]) 01/14/2013   Dyslipidemia 01/14/2013   PCP:  Marliss Coots, NP Pharmacy:   [place] [place] [facility] [address] [place] [address] Phone: [phone] Fax: [phone]  [facility] [place], [place] [address] [place] [address] Phone: [phone] Fax:  [phone]     Social Determinants of Health (SDOH) Interventions    Readmission Risk Interventions No flowsheet data found.

## 2020-04-15 NOTE — Progress Notes (Signed)
Nutrition Follow-up  DOCUMENTATION CODES:   Severe malnutrition in context of acute illness/injury  INTERVENTION:  Via Cortrak: -ContinueOsmolite 1.5@ 62m/hr -428mProsource TF BID -200 ml free water flush every 8 hours(or per MD)  Tube feeding regimen provides1880kcal (100% of needs),97grams of protein, and 91454mf H2O. Total free water: 1514 ml daily   NUTRITION DIAGNOSIS:   Severe Malnutrition related to acute illness as evidenced by energy intake < or equal to 50% for > or equal to 5 days, moderate fat depletion, moderate muscle depletion, percent weight loss.  Ongoing  GOAL:   Patient will meet greater than or equal to 90% of their needs  Met with TF  MONITOR:   PO intake, Supplement acceptance, Diet advancement, Weight trends, Labs, I & O's  REASON FOR ASSESSMENT:   Consult Enteral/tube feeding initiation and management  ASSESSMENT:   Pt presented with acute toxic metabolic encephalopathy. PMH includes DM, HTN, CKD, asthma, recent hospitalization for unresponsiveness/AMS where pt was diagnosed with seizure disorder.  8/12 s/p BSE recommend thin liquids 8/16 Cortrak placed (gastric)  Pt alert but minimally interactive.   Pt receiving TF via Cortrak. Per MD, pt continues to have little to no PO intake and the [place], pt may need PEG placed.   Current TF: Osmolite 1.5 cal @ 28m16m, 45ml13msource TF BID, 200ml 49m water Q8H  PO intake: 0-30% x last 8 recorded meals (3.75% average meal intake)  Labs: CBGs [phone]: Folvite, MVI, Novolog, Thiamine   Diet Order:   Diet Order            DIET - DYS 1 Room service appropriate? No; Fluid consistency: Thin  Diet effective now                 EDUCATION NEEDS:   Not appropriate for education at this time  Skin:  Skin Assessment: Reviewed RN Assessment  Last BM:  8/16  Height:   Ht Readings  from Last 1 Encounters:  04/02/20 '5\' 7"'  (1.702 m)    Weight:   Wt Readings from Last 1 Encounters:  04/13/20 50.3 kg    BMI:  Body mass index is 17.37 kg/m.  Estimated Nutritional Needs:   Kcal:  1750-16222-9798ein:  90-105 grams  Fluid:  >/=1.75L/d    AmandaLarkin InaRD, LDN RD pager number and weekend/on-call pager number located in [email or web address]

## 2020-04-15 NOTE — Progress Notes (Signed)
Patient pulled out her dobhoff with bridle still intact. MD oncall notified.

## 2020-04-16 LAB — GLUCOSE, CAPILLARY
Glucose-Capillary: 93 mg/dL (ref 70–99)
Glucose-Capillary: 100 mg/dL — ABNORMAL HIGH (ref 70–99)
Glucose-Capillary: 89 mg/dL (ref 70–99)
Glucose-Capillary: 87 mg/dL (ref 70–99)
Glucose-Capillary: 151 mg/dL — ABNORMAL HIGH (ref 70–99)

## 2020-04-16 MED ORDER — LEVETIRACETAM 500 MG PO TABS: 500.0000 mg | ORAL_TABLET | Freq: Two times a day (BID) | ORAL | Status: DC

## 2020-04-16 MED ORDER — ENOXAPARIN SODIUM 40 MG/0.4ML ~~LOC~~ SOLN
40.0000 mg | SUBCUTANEOUS | Status: AC
  Administered 2020-04-17 – 2020-04-18 (×2): 40 mg via SUBCUTANEOUS
  Filled 2020-04-16 (×2): qty 0.4

## 2020-04-16 MED ORDER — THIAMINE HCL 100 MG PO TABS
100.0000 mg | ORAL_TABLET | Freq: Every day | ORAL | Status: DC
  Administered 2020-04-17: 100 mg via ORAL
  Filled 2020-04-16: qty 1

## 2020-04-16 MED ORDER — FOLIC ACID 1 MG PO TABS
1.0000 mg | ORAL_TABLET | Freq: Every day | ORAL | Status: DC
  Administered 2020-04-17: 1 mg via ORAL
  Filled 2020-04-16: qty 1

## 2020-04-16 MED ORDER — ADULT MULTIVITAMIN W/MINERALS CH
1.0000 | ORAL_TABLET | Freq: Every day | ORAL | Status: DC
  Administered 2020-04-17: 1 via ORAL
  Filled 2020-04-16: qty 1

## 2020-04-16 MED ORDER — LEVETIRACETAM 100 MG/ML PO SOLN
500.0000 mg | Freq: Two times a day (BID) | ORAL | Status: DC
  Administered 2020-04-16 – 2020-04-18 (×5): 500 mg
  Filled 2020-04-16 (×5): qty 5

## 2020-04-16 MED ORDER — CEFAZOLIN SODIUM-DEXTROSE 2-4 GM/100ML-% IV SOLN: 2.0000 g | INTRAVENOUS | Status: AC

## 2020-04-16 NOTE — Consult Note (Signed)
Chief Complaint: Patient was seen in consultation today for percutaneous gastric tube placement Chief Complaint  Patient presents with  . Altered Mental Status   at the request of Dr Wendy Poet   Supervising Physician: Malachy Moan  Patient Status: [facility] - In-pt  History of Present Illness: Tamara [name] is a 59 y.o. 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  Per neurology, may not fully recover given the extent of nutritional brain injury.  She completed high-dose thiamine for 3 days, now continue thiamine supplementation daily.  Per neurology recommendations continue Keppra Severe protein calorie malnutrition-continue NG tube for now, unfortunately she has little to no p.o. intake and is solely reliant on the feeding tube.  Discussed with daughter at bedside, she consents for a PEG tube, IR consulted  Request made for percutaneous gastric tube placement Imaging reviewed and approved for procedure   Plan for early next week pts daughter aware  Past Medical History:  Diagnosis Date  . Memory changes     History reviewed. No pertinent surgical history.  Allergies: Chlorhexidine  Medications: Prior to Admission medications   Medication Sig Start Date End Date Taking? Authorizing Provider  Acetaminophen 500 MG capsule Take 1,000 mg by mouth 3 (three) times daily as needed for pain. 01/27/20  Yes [provider]  albuterol (PROVENTIL HFA;VENTOLIN HFA) 108 (90 BASE) MCG/ACT inhaler Inhale 2 puffs into the lungs every 6 (six) hours as needed for wheezing. 10/04/12  Yes Johnson,  Clanford L, MD  aspirin EC 81 MG tablet Take 81 mg by mouth daily.   Yes [provider]  buPROPion (WELLBUTRIN SR) 150 MG 12 hr tablet Take 150 mg by mouth daily.   Yes [provider]  Fluticasone-Salmeterol (ADVAIR) 250-50 MCG/DOSE AEPB Inhale 1 puff into the lungs 2 (two) times daily.   Yes [provider]  gabapentin (NEURONTIN) 300 MG capsule Take 1 capsule (300 mg total) by mouth 3 (three) times daily. 03/04/20  Yes Danford, Earl Lites, MD  LANTUS SOLOSTAR 100 UNIT/ML Solostar Pen Inject 20 Units into the skin 2 (two) times daily. 01/27/20  Yes [provider]  levETIRAcetam (KEPPRA) 500 MG tablet Take 1 tablet (500 mg total) by mouth 2 (two) times daily. 03/03/20  Yes Danford, Earl Lites, MD  loratadine (CLARITIN) 10 MG tablet Take 1 tablet (10 mg total) by mouth daily. 01/14/13  Yes Ghimire, Werner Lean, MD  losartan (COZAAR) 100 MG tablet Take 100 mg by mouth daily.   Yes [provider]  metFORMIN (GLUCOPHAGE) 1000 MG tablet Take 1,000 mg by mouth 2 (two) times daily with a meal.   Yes [provider]  atorvastatin (LIPITOR) 80 MG tablet Take 40 mg by mouth at bedtime.     [provider]  folic acid (FOLVITE) 1 MG tablet Take 1 tablet (1 mg total) by mouth daily. 03/04/20   Danford, Earl Lites, MD  magnesium 30 MG tablet Take 1 tablet (30 mg total) by mouth daily. 02/15/20   Terrilee Files, MD  [facility] 17 GM/SCOOP powder Take 17 g by mouth at bedtime. 01/27/20   [provider]  montelukast (SINGULAIR) 10 MG tablet Take  1 tablet (10 mg total) by mouth at bedtime. 10/04/12   Johnson, Clanford L, MD  omeprazole (PRILOSEC) 20 MG capsule Take 20 mg by mouth daily.    [provider]     Family History  Problem Relation Age of Onset  . Hypertension Mother   . Hypertension Father     Social History   Socioeconomic History  . Marital status: Single    Spouse name: Not on file  . Number of children: Not on  file  . Years of education: Not on file  . Highest education level: Not on file  Occupational History  . Not on file  Tobacco Use  . Smoking status: Never Smoker  . Smokeless tobacco: Never Used  Substance and Sexual Activity  . Alcohol use: Not Currently  . Drug use: Not Currently  . Sexual activity: Not on file  Other Topics Concern  . Not on file  Social History Narrative  . Not on file   Social Determinants of Health   Financial Resource Strain:   . Difficulty of Paying Living Expenses: Not on file  Food Insecurity:   . Worried About Programme researcher, broadcasting/film/video in the Last Year: Not on file  . Ran Out of Food in the Last Year: Not on file  Transportation Needs:   . Lack of Transportation (Medical): Not on file  . Lack of Transportation (Non-Medical): Not on file  Physical Activity:   . Days of Exercise per Week: Not on file  . Minutes of Exercise per Session: Not on file  Stress:   . Feeling of Stress : Not on file  Social Connections:   . Frequency of Communication with Friends and Family: Not on file  . Frequency of Social Gatherings with Friends and Family: Not on file  . Attends Religious Services: Not on file  . Active Member of Clubs or Organizations: Not on file  . Attends Banker Meetings: Not on file  . Marital Status: Not on file    Review of Systems: A 12 point ROS discussed and pertinent positives are indicated in the HPI above.  All other systems are negative.  Review of Systems  Vital Signs: BP 122/86   Pulse 73   Temp 98.1 F (36.7 C) (Oral)   Resp 18   Ht  (1.702 m)   Wt 110 lb 14.3 oz (50.3 kg)   SpO2 100%   BMI 17.37 kg/m   Physical Exam Vitals reviewed.  HENT:     Mouth/Throat:     Mouth: Mucous membranes are moist.  Cardiovascular:     Rate and Rhythm: Normal rate and regular rhythm.     Heart sounds: Normal heart sounds.  Pulmonary:     Breath sounds: Normal breath sounds.  Abdominal:     Tenderness: There is no  abdominal tenderness.  Skin:    General: Skin is warm.  Neurological:     Mental Status: She is disoriented.  Psychiatric:     Comments: No response today  Spoke to Dtr at bedside She has consented to G tube procedure in IR     Imaging: CT ABDOMEN PELVIS WO CONTRAST  Result Date: 04/02/2020 CLINICAL DATA:  Acute abdominal pain.  Decreased responsiveness. EXAM: CT ABDOMEN AND PELVIS WITHOUT CONTRAST TECHNIQUE: Multidetector CT imaging of the abdomen and pelvis was performed following the standard protocol without IV contrast. COMPARISON:  03/16/2020.  03/01/2020 FINDINGS: Lower chest: The lung bases are clear. Hepatobiliary: The gallbladder is  moderately distended. No stones or wall thickening. No bile duct dilatation. Unenhanced appearance of the liver is unremarkable. Pancreas: Unremarkable. No pancreatic ductal dilatation or surrounding inflammatory changes. Spleen: Normal in size without focal abnormality. Adrenals/Urinary Tract: Adrenal glands are unremarkable. Kidneys are normal, without renal calculi, focal lesion, or hydronephrosis. Bladder is unremarkable. Stomach/Bowel: Stomach, small bowel, and colon are not abnormally distended. No wall thickening or inflammatory changes are demonstrated. There is persistent finding of residual contrast material demonstrated throughout the colon and rectum. Streak artifact arising from contrast material limits examination. The appendix is normal. Vascular/Lymphatic: Aortic atherosclerosis. No enlarged abdominal or pelvic lymph nodes. Reproductive: Uterus is normal in size. Scattered uterine calcifications consistent with fibroids. No abnormal adnexal masses. Other: No abdominal wall hernia or abnormality. No abdominopelvic ascites. Musculoskeletal: No acute or significant osseous findings. IMPRESSION: 1. No acute process demonstrated in the abdomen or pelvis on noncontrast imaging. No evidence of bowel obstruction or inflammation. Residual contrast material  demonstrated throughout the colon. 2. Aortic atherosclerosis. 3. Uterine fibroids. Aortic Atherosclerosis (ICD10-I70.0). Electronically Signed   By: Burman NievesWilliam  Stevens M.D.   On: 04/02/2020 19:13   CT Head Wo Contrast  Result Date: 04/02/2020 CLINICAL DATA:  Decreased responsiveness. Mental status change of unknown cause. EXAM: CT HEAD WITHOUT CONTRAST TECHNIQUE: Contiguous axial images were obtained from the base of the skull through the vertex without intravenous contrast. COMPARISON:  CT 02/29/2020.  MRI 03/01/2020 FINDINGS: Brain: Diffuse cerebral atrophy. Ventricular dilatation consistent with central atrophy. Low-attenuation changes in the deep white matter consistent with small vessel ischemia. No abnormal extra-axial fluid collections. No mass effect or midline shift. Gray-white matter junctions are distinct. Basal cisterns are not effaced. No acute intracranial hemorrhage. Vascular: Intracranial arterial calcifications. Skull: Calvarium appears intact. Sinuses/Orbits: Paranasal sinuses and mastoid air cells are clear. Other: None. IMPRESSION: 1. No acute intracranial abnormalities. 2. Chronic atrophy and small vessel ischemia. Electronically Signed   By: Burman NievesWilliam  Stevens M.D.   On: 04/02/2020 19:16   MR ANGIO HEAD WO CONTRAST  Result Date: 04/07/2020 CLINICAL DATA:  59 year old female with abnormal signal in the bilateral thalami on recent brain MRI. EXAM: MRA HEAD WITHOUT CONTRAST TECHNIQUE: Angiographic images of the Circle of Willis were obtained using MRA technique without intravenous contrast. COMPARISON:  Brain MRI 04/06/2020 and earlier. Neck MRA today reported separately. FINDINGS: Antegrade flow in the posterior circulation with dominant left vertebral artery which appears to largely supply the basilar. Minimal flow signal in a diminutive right V4 segment. Patent basilar artery with tortuosity but no stenosis. Patent SCA origins. Fetal type left PCA origin, with small right posterior  communicating artery also. Bilateral PCA branches are patent and within normal limits, with tortuosity greater on the right. Antegrade flow in both ICA siphons. Tortuous cervical ICAs just below the skull base. No siphon stenosis. Ophthalmic and posterior communicating artery origins appear normal. Patent carotid termini, MCA and ACA origins. Anterior communicating artery and visible ACA branches are within normal limits. MCA M1 segments are tortuous without stenosis. Both MCA bifurcations are patent. Visible bilateral MCA branches are within normal limits. IMPRESSION: 1. Intracranial artery tortuosity with no stenosis or vessel occlusion identified. 2. Normal variations: Dominant left vertebral artery, the right is diminutive. Fetal type left PCA origin. Electronically Signed   By: Odessa FlemingH  Hall M.D.   On: 04/07/2020 22:20   MR ANGIO NECK W WO CONTRAST  Result Date: 04/07/2020 CLINICAL DATA:  59 year old female with abnormal signal in the bilateral thalami on recent brain MRI. EXAM: MRA  NECK WITHOUT AND WITH CONTRAST TECHNIQUE: Multiplanar and multiecho pulse sequences of the neck were obtained without and with intravenous contrast. Angiographic images of the neck were obtained using MRA technique without and with intravenous contrast. CONTRAST:  4mL GADAVIST GADOBUTROL 1 MMOL/ML IV SOLN was injected, however, the patient became agitated and incontinent of urine and stool at the same time. The IV access was lost at that time, and it was unclear how much contrast the patient received. COMPARISON:  Intracranial MRA today reported separately. Brain MRI 04/06/2020 and earlier. Cervical spine CT 02/29/2020.  Brain MRI 09/15/2006. FINDINGS: Precontrast time-of-flight images are mildly degraded by motion, demonstrating antegrade flow in both cervical carotid arteries and the left vertebral artery. Little to no flow signal in the right vertebral artery, which has appeared diminutive and non dominant at the skull base since  2008. However, the cervical Spine CT last month demonstrates a more symmetric appearance of the bony cervical transverse foramen. Both carotid bifurcations appear patent, without strong evidence of stenosis. Post-contrast MRA images were attempted but are motion degraded, and seem to indicate little contrast administration (see above). Due to patient agitation, no repeat post-contrast MRA images were attempted. IMPRESSION: 1. Noncontrast MRA images of the neck demonstrate patent bilateral carotid and left vertebral arteries. 2. No significant flow signal in the cervical right vertebral artery which has appeared diminutive - non-dominant at the skull base since 2008. The right vertebral could be congenitally non dominant or chronically occluded in the neck. 3. No diagnostic post-contrast MRA images could be obtained. See details above. Electronically Signed   By: Odessa Fleming M.D.   On: 04/07/2020 22:25   MR BRAIN WO CONTRAST  Result Date: 04/03/2020 CLINICAL DATA:  Delirium EXAM: MRI HEAD WITHOUT CONTRAST TECHNIQUE: Multiplanar, multiecho pulse sequences of the brain and surrounding structures were obtained without intravenous contrast. COMPARISON:  CT head 04/02/2020.  MRI head 03/01/2020 FINDINGS: Brain: New areas of diffusion signal in the posteromedial thalamus bilaterally. This is symmetric. No restricted diffusion but rather facilitated diffusion in these areas. No other abnormality on diffusion-weighted imaging. Normal midbrain. Pituitary normal in size. Generalized atrophy most prominent in the temporal lobes especially on the left. Hippocampal atrophy bilaterally. Negative for hemorrhage or mass. Vascular: Normal arterial flow voids. Skull and upper cervical spine: Negative Sinuses/Orbits: Negative Other: None IMPRESSION: 1. Interval development of diffusion hyperintensity in the posteromedial thalamus bilaterally without restricted diffusion. This could be subacute infarction particularly with artery of  Percheron anatomy. Wernicke's encephalopathy also could give this appearance however recent B12 levels are normal. 2. Generalized atrophy most prominent in the left temporal lobe. Electronically Signed   By: Marlan Palau M.D.   On: 04/03/2020 18:00   MR BRAIN W WO CONTRAST  Result Date: 04/06/2020 CLINICAL DATA:  Brain mass or lesion. EXAM: MRI HEAD WITHOUT AND WITH CONTRAST TECHNIQUE: Multiplanar, multiecho pulse sequences of the brain and surrounding structures were obtained without and with intravenous contrast. CONTRAST:  68mL GADAVIST GADOBUTROL 1 MMOL/ML IV SOLN COMPARISON:  04/03/2020 MRI head and prior.  04/02/2020 head CT. FINDINGS: Image quality is degraded by motion artifact. Brain: T2 hyperintense signal and restricted diffusion involving the posteromedial thalami is unchanged. There is no definite correlative enhancement. No new diffusion-weighted signal abnormality. No intracranial hemorrhage. No midline shift, ventriculomegaly or extra-axial fluid collection. No mass lesion. Parenchymal volume loss most prominent in the bilateral temporal lobes is unchanged. Vascular: Normal flow voids. Skull and upper cervical spine: Normal marrow signal. Sinuses/Orbits: Normal orbits. Clear paranasal  sinuses. No mastoid effusion. Other: None. IMPRESSION: Bilateral thalamic diffusion-weighted signal abnormality is unchanged. No definite abnormal enhancement. Differential includes subacute infarct versus Wernicke's encephalopathy. Cerebral atrophy demonstrating bitemporal predominance, unchanged. Electronically Signed   By: Stana Bunting M.D.   On: 04/06/2020 17:41   DG Chest Portable 1 View  Result Date: 04/02/2020 CLINICAL DATA:  Tachycardia EXAM: PORTABLE CHEST 1 VIEW COMPARISON:  02/15/2020 FINDINGS: The heart size and mediastinal contours are within normal limits. Both lungs are clear. The visualized skeletal structures are unremarkable. Residual contrast material in the visualized colon.  IMPRESSION: No active disease. Electronically Signed   By: Burman Nieves M.D.   On: 04/02/2020 20:07   EEG adult  Result Date: 04/04/2020 Rejeana Brock, MD     04/04/2020  6:08 PM History: 59 yo F being evaluated for AMS. Sedation: None Technique: This is a 21 channel routine scalp EEG performed at the bedside with bipolar and monopolar montages arranged in accordance to the international 10/20 system of electrode placement. One channel was dedicated to EKG recording. Background: The background consists of a posterior dominant rhythm of 7 to 8 Hz with superimposed generalized irregular delta and theta range activities.  Sleep structures are seen and are symmetric in nature. Photic stimulation: Physiologic driving is not performed EEG Abnormalities: 1) generalized irregular slow activity 2) slow posterior dominant rhythm Clinical Interpretation: This EEG is consistent with a generalized nonspecific cerebral dysfunction (encephalopathy).  There was no seizure or seizure predisposition recorded on this study. Please note that lack of epileptiform activity on EEG does not preclude the possibility of epilepsy. Ritta Slot, MD [facility] [phone] If 7pm- 7am, please page neurology on call as listed in AMION.   Overnight EEG with video  Result Date: 04/05/2020 Charlsie Quest, MD     04/05/2020 10:59 AM Patient Name: Trissa Molina MRN: [id number] Epilepsy Attending: Charlsie Quest Referring Physician/Provider: Dr. Onalee Hua Duration: 04/04/2020 1503 2 04/05/2020 1035 Patient history: 59 year old female being evaluated for altered mental status.  EEG to assess for seizures. Level of alertness: Awake, asleep AEDs during EEG study: Keppra Technical aspects: This EEG study was done with scalp electrodes positioned according to the 10-20 International system of electrode placement. Electrical activity was acquired at a sampling rate of 500Hz  and reviewed with a high frequency  filter of 70Hz  and a low frequency filter of 1Hz . EEG data were recorded continuously and digitally stored. Description: The posterior dominant rhythm consists of 8 Hz activity of moderate voltage (25-35 uV) seen predominantly in posterior head regions, symmetric and reactive to eye opening and eye closingSleep was characterized by vertex waves, sleep spindles (12 to 14 Hz), maximal frontocentral region. EEG showed continuous generalized 6-7 Hz theta slowing.  Hyperventilation and photic stimulation were not performed.   ABNORMALITY -Continuous slow, generalized IMPRESSION: This study is suggestive of mild diffuse encephalopathy, nonspecific etiology. No seizures or epileptiform discharges were seen throughout the recording.    Labs:  CBC: Recent Labs    04/04/20 0545 04/05/20 0311 04/06/20 0223 04/07/20 0430  WBC 10.5 11.5* 9.4 9.1  HGB 11.6* 9.9* 9.1* 8.9*  HCT 37.1 32.2* 28.4* 26.9*  PLT 308 267 261 237    COAGS: Recent Labs    02/15/20 2023 03/01/20 0724 04/03/20 0918  INR 1.1 1.2 1.3*  APTT  --  28  --     BMP: Recent Labs    04/11/20 0303 04/12/20 0231 04/13/20 0428 04/14/20 0141  NA 139 140 140  139  K 3.7 3.4* 4.2 4.2  CL 112* 110 111 109  CO2 18* 19* 21* 24  GLUCOSE 116* 69* 153* 94  BUN <5* <5* 7 9  CALCIUM 9.3 9.5 9.2 9.4  CREATININE 1.02* 1.01* 1.06* 0.93  GFRNONAA >60 >60 58* >60  GFRAA >60 >60 >60 >60    LIVER FUNCTION TESTS: Recent Labs    04/03/20 0918 04/04/20 1608 04/06/20 0223 04/09/20 0203 04/14/20 0141  BILITOT 0.7  --  0.4 0.1* 0.2*  AST 27  --  18 23 16   ALT 23  --  14 21 24   ALKPHOS 72  --  48 49 58  PROT 8.1  --  5.5* 5.3* 5.6*  ALBUMIN 4.1 3.7* 2.6* 2.5* 2.6*    TUMOR MARKERS: No results for input(s): AFPTM, CEA, CA199, CHROMGRNA in the last 8760 hours.  Assessment and Plan:  CVA AMS; thiamine deficiency FTT; PCM Dysphagia Need for long term care Tentatively scheduled for percutaneous gastric tube  placement in IR 8/23 Risks and benefits image guided gastrostomy tube placement was discussed with the patient's daughter at bedside including, but not limited to the need for a barium enema during the procedure, bleeding, infection, peritonitis and/or damage to adjacent structures.  All questions were answered, she is agreeable to proceed. Consent signed and in chart.   Thank you for this interesting consult.  I greatly enjoyed meeting Yvana Greulich and look forward to participating in their care.  A copy of this report was sent to the requesting provider on this date.  Electronically Signed: 9/23, PA-C 04/16/2020, 2:30 PM   I spent a total of 40 Minutes    in face to face in clinical consultation, greater than 50% of which was counseling/coordinating care for percutaneous gastric tube placement

## 2020-04-16 NOTE — Progress Notes (Signed)
  Speech Language Pathology Treatment:    Patient Details Name: Tamara [name] MRN: [id number] DOB: 08-05-1961 Today's Date: 04/16/2020 Time: 5974-1638 SLP Time Calculation (min) (ACUTE ONLY): 9 min  Assessment / Plan / Recommendation Clinical Impression  Pt was seen for dysphagia treatment with her daughter present. She indicated that the pt consumed minimal amounts of solids during lunch without s/sx of aspiration or there difficulty. Pt tolerated thin liquids via straw without symptoms of oropharyngeal dysphagia, but refused all solids despite encouragement from this SLP and her daughter. Pt consistently pursed her lips when boluses were presented. G-tube placement is planned for next week due to poor p.o. intake. It is recommended that the current diet be continued. Pt's oropharyngeal swallow mechanism appears within functional limits and further skilled SLP services are therefore not clinically indicated at this time.    HPI HPI: 59 yo female admitted 04/02/20 after a fall with AMS. Dx metabolic encephalopathy d/t AKI, thiamine deficiency PMH: several weeks of worsening mental status, asthma, DM, HTN, CKD3a, seizure d/o, dyslipidemia, chest pain. Hospitalized 03/01/20 MRI of the brain showed diffuse hyperintensity in the posterior medial thalamus bilaterally, possible infarction/Wernicke's encephalopathy.  Lumbar puncture unremarkable. EEG negative for seizures      SLP Plan  Discharge SLP treatment due to (comment);All goals met       Recommendations  Liquids provided via: Cup;Straw Medication Administration: Via alternative means Supervision: Staff to assist with self feeding Compensations: Slow rate;Small sips/bites Postural Changes and/or Swallow Maneuvers: Seated upright 90 degrees                Oral Care Recommendations: Oral care BID Follow up Recommendations: 24 hour supervision/assistance;Skilled Nursing facility SLP Visit Diagnosis: Dysphagia, unspecified (R13.10) Plan:  Discharge SLP treatment due to (comment);All goals met       Chai Verdejo I. Hardin Negus, [place], [place] Office number [phone] Pager [place] 04/16/2020, 3:24 PM

## 2020-04-16 NOTE — Progress Notes (Signed)
PROGRESS NOTE  Tamara [name] QQI:[id number] DOB: [birth year] DOA: 04/02/2020 PCP: [name], NP   LOS: 14 days   Brief Narrative / Interim history: 59 year old female with DM 2, hypertension, chronic kidney disease, asthma, seizure came in with confusion, worsening mental status, noncompliance with her medication, with symptoms progressing over the last several weeks.  Neurology has been consulted as well.  MRI of the brain showed diffuse hyperintensity in the posterior medial thalamus bilaterally, possible infarction/Wernicke's encephalopathy.  Lumbar puncture unremarkable.  She was empirically placed on high-dose thiamine.  EEG did not show any active seizures.  She was found to have thiamine deficiency.  Subjective / 24h Interval events: Speaking somewhat not interacting much.  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  Per neurology, may not fully recover given the extent of nutritional brain injury.  She completed high-dose thiamine for 3 days, now continue thiamine supplementation daily.  Per neurology recommendations continue Keppra.  Active Problems Acute kidney injury on chronic kidney disease stage IIIa / hypernatremia / hypokalemia / hypomagnesemia / hyperchloremia with non-anion gap metabolic acidosis -kidney function has remained stable  Severe protein calorie malnutrition-continue NG tube for now, unfortunately she has little to no p.o. intake and is solely reliant on the feeding tube.  Discussed with daughter at bedside, she consents for a PEG tube, IR consulted, to be placed next week  History of asthma-no wheezing, continue [facility]  Essential hypertension-hold home medications as her blood pressure is normal.  DM2, controlled-A1c 6.2.  CBGs fairly good, continue sliding scale  CBG (last 3)  Recent Labs     04/15/20 2334 04/16/20 0336 04/16/20 0807  GLUCAP 102* 93 100*    Scheduled Meds: . enoxaparin (LOVENOX) injection  40 mg Subcutaneous Q24H  . feeding supplement (PROSource TF)  45 mL Per Tube BID  . folic acid  1 mg Per Tube Daily  . free water  200 mL Per Tube Q8H  . geriatric multivitamins-minerals  15 mL Per Tube Daily  . insulin aspart  0-9 Units Subcutaneous TID WC  . levETIRAcetam  500 mg Per Tube BID  . mometasone-formoterol  2 puff Inhalation BID  . thiamine  100 mg Per Tube Daily   Continuous Infusions: . feeding supplement (OSMOLITE 1.5 CAL) Stopped (04/13/20 2355)   PRN Meds:.albuterol  Diet Orders (From admission, onward)    Start     Ordered   04/15/20 1414  DIET DYS 2 Room service appropriate? Yes with Assist; Fluid consistency: Thin  Diet effective now       Question Answer Comment  Room service appropriate? Yes with Assist   Fluid consistency: Thin      04/15/20 1413          DVT prophylaxis: enoxaparin (LOVENOX) injection 40 mg Start: 04/09/20 1230     Code Status: Full Code  Family Communication: Daughter [name], [phone]  Status is: Inpatient  Remains inpatient appropriate because:Inpatient level of care appropriate due to severity of illness   Dispo: The patient is from: Home              Anticipated d/c is to: SNF              Anticipated d/c date is: 3 days              Patient currently is not medically stable to d/c.  Consultants:  Neurology  Procedures:  None   Microbiology  None   Antimicrobials: None     Objective: Vitals:   04/15/20 2332 04/15/20 2335 04/16/20 0337 04/16/20 0805  BP: (!) 148/74 126/73 125/80 130/81  Pulse: 73 68 77 75  Resp: 16 17 16 16   Temp: 98.5 F (36.9 C) 98.5 F (36.9 C) 98.4 F (36.9 C) 98.4 F (36.9 C)  TempSrc: Oral Oral Oral Oral  SpO2: 100% 100% 100% 100%  Weight:      Height:        Intake/Output Summary (Last 24 hours) at 04/16/2020 1046 Last data filed at  04/16/2020 0430 Gross per 24 hour  Intake 330 ml  Output 1125 ml  Net -795 ml   Filed Weights   04/02/20 2318 04/13/20 0458  Weight: 54.1 kg 50.3 kg    Examination:  Constitutional: NAD Respiratory: CTA Cardiovascular: Regular rate and rhythm   Data Reviewed: I have independently reviewed following labs and imaging studies   CBC: No results for input(s): WBC, NEUTROABS, HGB, HCT, MCV, PLT in the last 168 hours. Basic Metabolic Panel: Recent Labs  Lab 04/10/20 0254 04/11/20 0303 04/12/20 0231 04/12/20 1408 04/12/20 1822 04/13/20 0428 04/13/20 1641 04/14/20 0141  NA 138 139 140  --   --  140  --  139  K 3.9 3.7 3.4*  --   --  4.2  --  4.2  CL 111 112* 110  --   --  111  --  109  CO2 21* 18* 19*  --   --  21*  --  24  GLUCOSE 116* 116* 69*  --   --  153*  --  94  BUN 5* <5* <5*  --   --  7  --  9  CREATININE 1.07* 1.02* 1.01*  --   --  1.06*  --  0.93  CALCIUM 9.2 9.3 9.5  --   --  9.2  --  9.4  MG 1.8  --   --  1.3* 2.1 1.8 2.1  --   PHOS  --   --   --  3.6 3.2 3.9 3.4  --    Liver Function Tests: Recent Labs  Lab 04/14/20 0141  AST 16  ALT 24  ALKPHOS 58  BILITOT 0.2*  PROT 5.6*  ALBUMIN 2.6*   Coagulation Profile: No results for input(s): INR, PROTIME in the last 168 hours. HbA1C: No results for input(s): HGBA1C in the last 72 hours. CBG: Recent Labs  Lab 04/15/20 1552 04/15/20 1944 04/15/20 2334 04/16/20 0336 04/16/20 0807  GLUCAP 136* 128* 102* 93 100*    No results found for this or any previous visit (from the past 240 hour(s)).   Radiology Studies: No results found.  04/18/20, MD, PhD [facility]  Between 7 am - 7 pm I am available, please contact me via Amion or Securechat  Between 7 pm - 7 am I am not available, please contact night coverage MD/APP via Amion

## 2020-04-16 NOTE — Progress Notes (Signed)
Per Dr. Rachael Darby, V.O. "ok to change CBG to q4 and PO meds to per tube".

## 2020-04-16 NOTE — Procedures (Signed)
Cortrak  Tube Type:  Cortrak - 43 inches Tube Location:  Left nare Initial Placement:  Stomach Secured by: Bridle Technique Used to Measure Tube Placement:  Documented cm marking at nare/ corner of mouth Cortrak Secured At:  65 cm    Cortrak Tube Team Note:  Consult received to place a Cortrak feeding tube.   No x-ray is required. RN may begin using tube.    If the tube becomes dislodged please keep the tube and contact the Cortrak team at [email or web address] ([email or web address]) for replacement.  If after hours and replacement cannot be delayed, place a NG tube and confirm placement with an abdominal x-ray.    Selina Tapper MS, RD, LDN Please refer to AMION for RD and/or RD on-call/weekend/after hours pager   

## 2020-04-16 NOTE — Progress Notes (Signed)
Occupational Therapy Treatment Patient Details Name: Tamara [name] MRN: [id number] DOB: Jul 22, 1961 Today's Date: 04/16/2020    History of present illness 59 y.o. female with medical history significant for dm, htn, ckd, asthma, recent hospitalization (07/21) for unresponsiveness/AMS and diagnosed w/ seizure disorder and started on anticonvulsant. Presented to ED 04/02/20 with AMS after several weeks of poor oral intake. Found to have AKI and metabolic encephalopathy. Negative extended EEG, toxicology, CSF. MRI brain- Hyperintensity in the posteromedial thalami bilaterally (possibly infarction vs Wernicke's encephalopathy); low thiamine level   OT comments  Patient continues to make steady progress towards goals in skilled OT session. Patient's session encompassed ADLs, functional mobility, transfers, and overall cognition assessment. Pt with decreased responses in session to date, only able to state name and at the end of the session say "its hot". Pt also demonstrated increased difficulty in sequencing basic ADL tasks (washing face) and placing feet on the ground when sitting at the EOB. Pt able to stand pivot to the recliner with min A in session, however due to need for significant multi-modal cues with all functional tasks did not attempt further ambulation. Discharge remains appropriate at this time, will continue to follow acutely.    Follow Up Recommendations  SNF;Supervision/Assistance - 24 hour    Equipment Recommendations  Other (comment) (Defer to next venue)    Recommendations for Other Services      Precautions / Restrictions Precautions Precautions: Fall Restrictions Weight Bearing Restrictions: No       Mobility Bed Mobility Overal bed mobility: Needs Assistance Bed Mobility: Supine to Sit     Supine to sit: Min assist;HOB elevated     General bed mobility comments: Pt with increased ability to sequence movements to sit up, however was unable to cogitate how to  bring feet to the floor despite multiple attempts  Transfers Overall transfer level: Needs assistance Equipment used: Rolling walker (2 wheeled) Transfers: Sit to/from [employer] Sit to Stand: Min assist Stand pivot transfers: Min assist       General transfer comment: Pt with increased ability to transfer, however requires increased cues in order to complete    Balance Overall balance assessment: Needs assistance Sitting-balance support: No upper extremity supported;Feet supported;Bilateral upper extremity supported Sitting balance-Leahy Scale: Fair Sitting balance - Comments: Pt with increased seated balance to date, no LOB noted   Standing balance support: During functional activity;No upper extremity supported Standing balance-Leahy Scale: Fair Standing balance comment: reliant on UE support                           ADL either performed or assessed with clinical judgement   ADL Overall ADL's : Needs assistance/impaired     Grooming: Wash/dry hands;Wash/dry face;Sitting;Set up;Moderate assistance Grooming Details (indicate cue type and reason): hand over hand assist to wash face, but perseverating on cleaning under fingernails         Upper Body Dressing : Moderate assistance;Cueing for sequencing Upper Body Dressing Details (indicate cue type and reason): pt often pulling arms in under gown instead of threading through arm sleeves, requiring step by step cues                 Functional mobility during ADLs: Minimal assistance;Moderate assistance;Cueing for sequencing;Cueing for safety General ADL Comments: Dependant on one step multi-modal cues for completion of all tasks to date     Vision       Perception     Praxis  Cognition Arousal/Alertness: Awake/alert Behavior During Therapy: Flat affect Overall Cognitive Status: Impaired/Different from baseline Area of Impairment: Orientation;Attention;Following  commands;Memory;Safety/judgement;Awareness;Problem solving                 Orientation Level: Disoriented to;Place;Time;Situation Current Attention Level: Focused;Sustained Memory: Decreased short-term memory;Decreased recall of precautions Following Commands: Follows one step commands inconsistently;Follows one step commands with increased time Safety/Judgement: Decreased awareness of deficits;Decreased awareness of safety Awareness: Intellectual Problem Solving: Slow processing;Decreased initiation;Difficulty sequencing;Requires verbal cues;Requires tactile cues General Comments: pt smiling at therapist, however wouldnt answer questions or speak much in session, required multi-modal cues for all movement        Exercises     Shoulder Instructions       General Comments      Pertinent Vitals/ Pain       Pain Assessment: Faces Faces Pain Scale: No hurt  Home Living                                          Prior Functioning/Environment              Frequency  Min 2X/week        Progress Toward Goals  OT Goals(current goals can now be found in the care plan section)  Progress towards OT goals: Progressing toward goals  Acute Rehab OT Goals Patient Stated Goal: patient unable to participate in goal setting OT Goal Formulation: Patient unable to participate in goal setting Time For Goal Achievement: 04/22/20 Potential to Achieve Goals: Good  Plan Discharge plan remains appropriate    Co-evaluation                 AM-PAC OT "6 Clicks" Daily Activity     Outcome Measure   Help from another person eating meals?: A Lot Help from another person taking care of personal grooming?: A Lot Help from another person toileting, which includes using toliet, bedpan, or urinal?: A Lot Help from another person bathing (including washing, rinsing, drying)?: A Lot Help from another person to put on and taking off regular upper body clothing?: A  Lot Help from another person to put on and taking off regular lower body clothing?: A Lot 6 Click Score: 12    End of Session Equipment Utilized During Treatment: Gait belt;Rolling walker  OT Visit Diagnosis: Other abnormalities of gait and mobility (R26.89);Muscle weakness (generalized) (M62.81);Other symptoms and signs involving cognitive function;Cognitive communication deficit (R41.841)   Activity Tolerance Patient tolerated treatment well;Patient limited by fatigue   Patient Left in chair;with call bell/phone within reach;with chair alarm set   Nurse Communication Mobility status        Time: 3220-2542 OT Time Calculation (min): 23 min  Charges: OT General Charges $OT Visit: 1 Visit OT Treatments $Self Care/Home Management : 23-37 mins  Pollyann Glen E. Christyl Osentoski, COTA/L Acute Rehabilitation Services [phone]   Cherlyn Cushing 04/16/2020, 12:06 PM

## 2020-04-16 NOTE — Plan of Care (Signed)
  Problem: Education: Goal: Knowledge of General Education information will improve Description Including pain rating scale, medication(s)/side effects and non-pharmacologic comfort measures Outcome: Progressing   

## 2020-04-17 LAB — BASIC METABOLIC PANEL
Anion gap: 11 (ref 5–15)
BUN: 16 mg/dL (ref 6–20)
CO2: 24 mmol/L (ref 22–32)
Calcium: 9.6 mg/dL (ref 8.9–10.3)
Chloride: 103 mmol/L (ref 98–111)
Creatinine, Ser: 0.91 mg/dL (ref 0.44–1.00)
GFR calc Af Amer: >60 mL/min (ref >60)
GFR calc non Af Amer: >60 mL/min (ref >60)
Glucose, Bld: 140 mg/dL — ABNORMAL HIGH (ref 70–99)
Potassium: 4.1 mmol/L (ref 3.5–5.1)
Sodium: 138 mmol/L (ref 135–145)

## 2020-04-17 LAB — GLUCOSE, CAPILLARY
Glucose-Capillary: 136 mg/dL — ABNORMAL HIGH (ref 70–99)
Glucose-Capillary: 145 mg/dL — ABNORMAL HIGH (ref 70–99)
Glucose-Capillary: 146 mg/dL — ABNORMAL HIGH (ref 70–99)
Glucose-Capillary: 148 mg/dL — ABNORMAL HIGH (ref 70–99)
Glucose-Capillary: 150 mg/dL — ABNORMAL HIGH (ref 70–99)
Glucose-Capillary: 150 mg/dL — ABNORMAL HIGH (ref 70–99)
Glucose-Capillary: 98 mg/dL (ref 70–99)

## 2020-04-17 LAB — CBC
HCT: 28.4 % — ABNORMAL LOW (ref 36.0–46.0)
Hemoglobin: 9.1 g/dL — ABNORMAL LOW (ref 12.0–15.0)
MCH: 28.9 pg (ref 26.0–34.0)
MCHC: 32 g/dL (ref 30.0–36.0)
MCV: 90.2 fL (ref 80.0–100.0)
Platelets: 294 10*3/uL (ref 150–400)
RBC: 3.15 MIL/uL — ABNORMAL LOW (ref 3.87–5.11)
RDW: 14.9 % (ref 11.5–15.5)
WBC: 10.1 10*3/uL (ref 4.0–10.5)
nRBC: 0 % (ref 0.0–0.2)

## 2020-04-17 MED ORDER — THIAMINE HCL 100 MG PO TABS
100.0000 mg | ORAL_TABLET | Freq: Every day | ORAL | Status: DC
  Administered 2020-04-18 – 2020-04-23 (×5): 100 mg
  Filled 2020-04-17 (×6): qty 1

## 2020-04-17 MED ORDER — ADULT MULTIVITAMIN W/MINERALS CH
1.0000 | ORAL_TABLET | Freq: Every day | ORAL | Status: DC
  Administered 2020-04-18 – 2020-04-23 (×5): 1
  Filled 2020-04-17 (×6): qty 1

## 2020-04-17 MED ORDER — FOLIC ACID 1 MG PO TABS
1.0000 mg | ORAL_TABLET | Freq: Every day | ORAL | Status: DC
  Administered 2020-04-18 – 2020-04-23 (×5): 1 mg
  Filled 2020-04-17 (×6): qty 1

## 2020-04-17 NOTE — Progress Notes (Signed)
PROGRESS NOTE  Tamara [name] GEZ:[id number] DOB: [birth year] DOA: 04/02/2020 PCP: Tamara Sharps, NP   LOS: 15 days   Brief Narrative / Interim history: 59 year old female with DM 2, hypertension, chronic kidney disease, asthma, seizure came in with confusion, worsening mental status, noncompliance with her medication, with symptoms progressing over the last several weeks.  Neurology has been consulted as well.  MRI of the brain showed diffuse hyperintensity in the posterior medial thalamus bilaterally, possible infarction/Wernicke's encephalopathy.  Lumbar puncture unremarkable.  She was empirically placed on high-dose thiamine.  EEG did not show any active seizures.  She was found to have thiamine deficiency.  Subjective / 24h Interval events: Alert, calm, tells me her name but otherwise does not answer my questions.  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  Per neurology, may not fully recover given the extent of nutritional brain injury.  She completed high-dose thiamine for 3 days, now continue thiamine supplementation daily.  Per neurology recommendations continue Keppra.  Active Problems Acute kidney injury on chronic kidney disease stage IIIa / hypernatremia / hypokalemia / hypomagnesemia / hyperchloremia with non-anion gap metabolic acidosis -kidney function has remained stable  Severe protein calorie malnutrition-continue NG tube for now, unfortunately she has little to no p.o. intake and is solely reliant on the feeding tube.  Discussed with daughter at bedside, she consents for a PEG tube, IR consulted, to be placed next week  History of asthma-no wheezing, continue [facility]  Essential hypertension-hold home medications as her blood pressure is normal.  DM2, controlled-A1c 6.2.  CBGs fairly good, continue sliding  scale  CBG (last 3)  Recent Labs    04/17/20 0008 04/17/20 0358 04/17/20 0745  GLUCAP 150* 150* 145*    Scheduled Meds: . enoxaparin (LOVENOX) injection  40 mg Subcutaneous Q24H  . feeding supplement (PROSource TF)  45 mL Per Tube BID  . [START ON 04/18/2020] folic acid  1 mg Per Tube Daily  . free water  200 mL Per Tube Q8H  . insulin aspart  0-9 Units Subcutaneous TID WC  . levETIRAcetam  500 mg Per Tube BID  . mometasone-formoterol  2 puff Inhalation BID  . [START ON 04/18/2020] multivitamin with minerals  1 tablet Per Tube Daily  . [START ON 04/18/2020] thiamine  100 mg Per Tube Daily   Continuous Infusions: . [START ON 04/19/2020]  ceFAZolin (ANCEF) IV    . feeding supplement (OSMOLITE 1.5 CAL) 1,000 mL (04/16/20 1535)   PRN Meds:.albuterol  Diet Orders (From admission, onward)    Start     Ordered   04/19/20 0001  Diet NPO time specified  Diet effective midnight       Comments: Stop tube feeds after MN for 8/23 procedure   04/16/20 1438   04/15/20 1414  DIET DYS 2 Room service appropriate? Yes with Assist; Fluid consistency: Thin  Diet effective now       Question Answer Comment  Room service appropriate? Yes with Assist   Fluid consistency: Thin      04/15/20 1413          DVT prophylaxis: enoxaparin (LOVENOX) injection 40 mg Start: 04/17/20 1230     Code Status: Full Code  Family Communication: Daughter Tamara [name], [phone]  Status is: Inpatient  Remains inpatient appropriate because:Inpatient level of care appropriate due to severity of illness   Dispo: The patient is from: Home  Anticipated d/c is to: SNF              Anticipated d/c date is: 3 days              Patient currently is not medically stable to d/c.  Consultants:  Neurology   Procedures:  None   Microbiology  None   Antimicrobials: None     Objective: Vitals:   04/17/20 0332 04/17/20 0357 04/17/20 0743 04/17/20 0821  BP: 107/76  115/73   Pulse:   81 76   Resp: 19  16 16   Temp: 98.3 F (36.8 C)  98.2 F (36.8 C)   TempSrc: Oral  Axillary   SpO2: 98%  100% 99%  Weight:  56.6 kg    Height:        Intake/Output Summary (Last 24 hours) at 04/17/2020 1137 Last data filed at 04/17/2020 1132 Gross per 24 hour  Intake 5869 ml  Output 1000 ml  Net 4869 ml   Filed Weights   04/02/20 2318 04/13/20 0458 04/17/20 0357  Weight: 54.1 kg 50.3 kg 56.6 kg    Examination:  Constitutional: NAD Respiratory: CTA Cardiovascular: Regular rate and rhythm, no murmurs   Data Reviewed: I have independently reviewed following labs and imaging studies   CBC: Recent Labs  Lab 04/17/20 0300  WBC 10.1  HGB 9.1*  HCT 28.4*  MCV 90.2  PLT 294   Basic Metabolic Panel: Recent Labs  Lab 04/11/20 0303 04/12/20 0231 04/12/20 1408 04/12/20 1822 04/13/20 0428 04/13/20 1641 04/14/20 0141 04/17/20 0300  NA 139 140  --   --  140  --  139 138  K 3.7 3.4*  --   --  4.2  --  4.2 4.1  CL 112* 110  --   --  111  --  109 103  CO2 18* 19*  --   --  21*  --  24 24  GLUCOSE 116* 69*  --   --  153*  --  94 140*  BUN <5* <5*  --   --  7  --  9 16  CREATININE 1.02* 1.01*  --   --  1.06*  --  0.93 0.91  CALCIUM 9.3 9.5  --   --  9.2  --  9.4 9.6  MG  --   --  1.3* 2.1 1.8 2.1  --   --   PHOS  --   --  3.6 3.2 3.9 3.4  --   --    Liver Function Tests: Recent Labs  Lab 04/14/20 0141  [name] 16  ALT 24  ALKPHOS 58  BILITOT 0.2*  PROT 5.6*  ALBUMIN 2.6*   Coagulation Profile: No results for input(s): INR, PROTIME in the last 168 hours. HbA1C: No results for input(s): HGBA1C in the last 72 hours. CBG: Recent Labs  Lab 04/16/20 1625 04/16/20 2015 04/17/20 0008 04/17/20 0358 04/17/20 0745  GLUCAP 87 151* 150* 150* 145*    No results found for this or any previous visit (from the past 240 hour(s)).   Radiology Studies: No results found.  04/19/20, MD, PhD [facility]  Between 7 am - 7 pm I am available, please contact me via  Amion or Securechat  Between 7 pm - 7 am I am not available, please contact night coverage MD/APP via Amion

## 2020-04-17 NOTE — Plan of Care (Signed)
  Problem: Skin Integrity: Goal: Risk for impaired skin integrity will decrease Outcome: Progressing   

## 2020-04-17 NOTE — Plan of Care (Signed)

## 2020-04-18 LAB — GLUCOSE, CAPILLARY
Glucose-Capillary: 147 mg/dL — ABNORMAL HIGH (ref 70–99)
Glucose-Capillary: 149 mg/dL — ABNORMAL HIGH (ref 70–99)
Glucose-Capillary: 122 mg/dL — ABNORMAL HIGH (ref 70–99)
Glucose-Capillary: 147 mg/dL — ABNORMAL HIGH (ref 70–99)
Glucose-Capillary: 117 mg/dL — ABNORMAL HIGH (ref 70–99)
Glucose-Capillary: 106 mg/dL — ABNORMAL HIGH (ref 70–99)

## 2020-04-18 NOTE — Progress Notes (Signed)
PROGRESS NOTE    Tamara [name]  KAJ:[id number] DOB: March 31, 1961 DOA: 04/02/2020 PCP: [name], NP   Brief Narrative: Tamara [name] is a 59 y.o. female with DM 2, hypertension, chronic kidney disease, asthma, seizure came in with confusion, worsening mental status, noncompliance with her medication, with symptoms progressing over the last several weeks.  Neurology has been consulted as well.  MRI of the brain    Assessment & Plan:   Principal Problem:   Acute metabolic encephalopathy Active Problems:   Asthma with acute exacerbation   Diabetes (HCC)   Acute encephalopathy   AKI (acute kidney injury) (HCC)   Protein-calorie malnutrition, severe  Wernicke's encephalopathy Thiamine deficiency Patient treated with high dose thiamine. MRI suggests possible subacute bilateral thalamic infarcts however, more likely Wernicke's encephalopathy -Continue thiamine, Keppra  AKI on CKD stage IIIa Resolved  Hypernatremia Resolved  Hypokalemia Repleted. Resolved.  Hypomagnesemia Repleted. Resolved.  Non-anion gap metabolic acidosis Resolved.  Severe malnutrition Currently with NG tube. Plan to placed PEG tube -Continue tube feeds  Essential hypertension Patient is on losartan as an outpatient. BP controlled.  Diabetes mellitus, type 2 Controlled. Hemoglobin A1C of 6.2%.   DVT prophylaxis: SCDs Code Status:   Code Status: Full Code Family Communication: None at bedside Disposition Plan: Discharge to SNF once PEG tube placed   Consultants:   Neurology  Interventional radiology  Procedures:   None  Antimicrobials:  None    Subjective: No issues overnight  Objective: Vitals:   04/18/20 0647 04/18/20 0731 04/18/20 0757 04/18/20 1228  BP: (!) 135/53  114/72 116/89  Pulse: 70 74 85 85  Resp:  16 17 18   Temp: 98 F (36.7 C)  97.9 F (36.6 C) 98.4 F (36.9 C)  TempSrc: Oral  Axillary Oral  SpO2: 100%  99% 100%  Weight: (!) 136.7 kg     Height:         Intake/Output Summary (Last 24 hours) at 04/18/2020 1415 Last data filed at 04/18/2020 1126 Gross per 24 hour  Intake 2387 ml  Output 2850 ml  Net -463 ml   Filed Weights   04/17/20 0357 04/18/20 0327 04/18/20 0647  Weight: 56.6 kg 57.2 kg (!) 136.7 kg    Examination:  General exam: Appears calm and comfortable     Data Reviewed: I have personally reviewed following labs and imaging studies  CBC Lab Results  Component Value Date   WBC 10.1 04/17/2020   RBC 3.15 (L) 04/17/2020   HGB 9.1 (L) 04/17/2020   HCT 28.4 (L) 04/17/2020   MCV 90.2 04/17/2020   MCH 28.9 04/17/2020   PLT 294 04/17/2020   MCHC 32.0 04/17/2020   RDW 14.9 04/17/2020   LYMPHSABS 3.8 04/05/2020   MONOABS 0.8 04/05/2020   EOSABS 0.1 04/05/2020   BASOSABS 0.0 04/05/2020     Last metabolic panel Lab Results  Component Value Date   NA 138 04/17/2020   K 4.1 04/17/2020   CL 103 04/17/2020   CO2 24 04/17/2020   BUN 16 04/17/2020   CREATININE 0.91 04/17/2020   GLUCOSE 140 (H) 04/17/2020   GFRNONAA >60 04/17/2020   GFRAA >60 04/17/2020   CALCIUM 9.6 04/17/2020   PHOS 3.4 04/13/2020   PROT 5.6 (L) 04/14/2020   ALBUMIN 2.6 (L) 04/14/2020   BILITOT 0.2 (L) 04/14/2020   ALKPHOS 58 04/14/2020   AST 16 04/14/2020   ALT 24 04/14/2020   ANIONGAP 11 04/17/2020    CBG (last 3)  Recent Labs  04/18/20 0307 04/18/20 0800 04/18/20 1224  GLUCAP 147* 149* 122*     GFR: Estimated Creatinine Clearance: 98.6 mL/min (by C-G formula based on SCr of 0.91 mg/dL).  Coagulation Profile: No results for input(s): INR, PROTIME in the last 168 hours.  No results found for this or any previous visit (from the past 240 hour(s)).      Radiology Studies: No results found.      Scheduled Meds: . feeding supplement (PROSource TF)  45 mL Per Tube BID  . folic acid  1 mg Per Tube Daily  . free water  200 mL Per Tube Q8H  . insulin aspart  0-9 Units Subcutaneous TID WC  . levETIRAcetam  500 mg  Per Tube BID  . mometasone-formoterol  2 puff Inhalation BID  . multivitamin with minerals  1 tablet Per Tube Daily  . thiamine  100 mg Per Tube Daily   Continuous Infusions: . [START ON 04/19/2020]  ceFAZolin (ANCEF) IV    . feeding supplement (OSMOLITE 1.5 CAL) 1,000 mL (04/18/20 1254)     LOS: 16 days     Jacquelin Hawking, MD [facility] 04/18/2020, 2:15 PM  If 7PM-7AM, please contact night-coverage [email or web address]

## 2020-04-18 NOTE — Plan of Care (Signed)
  Problem: Activity: Goal: Risk for activity intolerance will decrease Outcome: Progressing   

## 2020-04-18 NOTE — Plan of Care (Signed)

## 2020-04-19 ENCOUNTER — Inpatient Hospital Stay (HOSPITAL_COMMUNITY): Payer: Self-pay

## 2020-04-19 HISTORY — PX: IR GASTROSTOMY TUBE MOD SED: IMG625

## 2020-04-19 LAB — GLUCOSE, CAPILLARY
Glucose-Capillary: 104 mg/dL — ABNORMAL HIGH (ref 70–99)
Glucose-Capillary: 92 mg/dL (ref 70–99)
Glucose-Capillary: 87 mg/dL (ref 70–99)
Glucose-Capillary: 88 mg/dL (ref 70–99)
Glucose-Capillary: 76 mg/dL (ref 70–99)
Glucose-Capillary: 115 mg/dL — ABNORMAL HIGH (ref 70–99)

## 2020-04-19 MED ORDER — HYDROCODONE-ACETAMINOPHEN 5-325 MG PO TABS
1.0000 | ORAL_TABLET | ORAL | Status: DC | PRN
  Administered 2020-04-20: 1 via ORAL
  Filled 2020-04-19: qty 1

## 2020-04-19 MED ORDER — FENTANYL CITRATE (PF) 100 MCG/2ML IJ SOLN
INTRAMUSCULAR | Status: AC
Start: 2020-04-19 — End: 2020-04-20
  Filled 2020-04-19: qty 2

## 2020-04-19 MED ORDER — LIDOCAINE HCL 1 % IJ SOLN
INTRAMUSCULAR | Status: AC | PRN
  Administered 2020-04-19: 10 mL

## 2020-04-19 MED ORDER — GLUCAGON HCL RDNA (DIAGNOSTIC) 1 MG IJ SOLR
INTRAMUSCULAR | Status: AC
  Administered 2020-04-19: 0.5 mg via INTRAVENOUS
  Filled 2020-04-19: qty 1

## 2020-04-19 MED ORDER — MIDAZOLAM HCL 2 MG/2ML IJ SOLN
INTRAMUSCULAR | Status: AC
  Filled 2020-04-19: qty 2

## 2020-04-19 MED ORDER — CEFAZOLIN SODIUM-DEXTROSE 2-4 GM/100ML-% IV SOLN
INTRAVENOUS | Status: AC
  Administered 2020-04-19: 2 g via INTRAVENOUS
  Filled 2020-04-19: qty 100

## 2020-04-19 MED ORDER — HYDROMORPHONE HCL 1 MG/ML IJ SOLN
1.0000 mg | INTRAMUSCULAR | Status: DC | PRN
  Administered 2020-04-20: 1 mg via INTRAVENOUS
  Filled 2020-04-19: qty 1

## 2020-04-19 MED ORDER — ONDANSETRON HCL 4 MG/2ML IJ SOLN: 4.0000 mg | INTRAMUSCULAR | Status: DC | PRN

## 2020-04-19 MED ORDER — DEXTROSE-NACL 5-0.45 % IV SOLN: INTRAVENOUS | Status: AC

## 2020-04-19 MED ORDER — ENOXAPARIN SODIUM 40 MG/0.4ML ~~LOC~~ SOLN
40.0000 mg | SUBCUTANEOUS | Status: DC
  Administered 2020-04-20 – 2020-04-23 (×4): 40 mg via SUBCUTANEOUS
  Filled 2020-04-19 (×4): qty 0.4

## 2020-04-19 MED ORDER — LEVETIRACETAM IN NACL 500 MG/100ML IV SOLN
500.0000 mg | Freq: Two times a day (BID) | INTRAVENOUS | Status: DC
  Administered 2020-04-19 – 2020-04-20 (×3): 500 mg via INTRAVENOUS
  Filled 2020-04-19 (×3): qty 100

## 2020-04-19 MED ORDER — LIDOCAINE HCL 1 % IJ SOLN
INTRAMUSCULAR | Status: AC
  Filled 2020-04-19: qty 20

## 2020-04-19 MED ORDER — IOHEXOL 300 MG/ML  SOLN
50.0000 mL | Freq: Once | INTRAMUSCULAR | Status: AC | PRN
  Administered 2020-04-19: 10 mL

## 2020-04-19 MED ORDER — MIDAZOLAM HCL 2 MG/2ML IJ SOLN
INTRAMUSCULAR | Status: AC | PRN
  Administered 2020-04-19: 0.5 mg via INTRAVENOUS
  Administered 2020-04-19: 1 mg via INTRAVENOUS

## 2020-04-19 MED ORDER — FENTANYL CITRATE (PF) 100 MCG/2ML IJ SOLN
INTRAMUSCULAR | Status: AC | PRN
  Administered 2020-04-19: 25 ug via INTRAVENOUS
  Administered 2020-04-19: 50 ug via INTRAVENOUS

## 2020-04-19 MED ORDER — SODIUM CHLORIDE 0.45 % IV SOLN: INTRAVENOUS | Status: DC

## 2020-04-19 NOTE — Procedures (Signed)
  Procedure: Gastrostomy tube placement 20f EBL:   minimal Complications:  none immediate  See full dictation in [facility] PACS.  D. Doneshia Hill MD Main # [phone] Pager  [phone]    

## 2020-04-19 NOTE — Progress Notes (Signed)
Trend down in CBGs. Opyd informed and RN request to add D5 fluid to offset hypoglycemia.

## 2020-04-19 NOTE — Progress Notes (Signed)
MEDICATION-RELATED CONSULT NOTE   IR Procedure Consult - Anticoagulant/Antiplatelet PTA/Inpatient Med List Review by Pharmacist    Procedure:  PEG tube placement    Completed: 8/23 1:35 pm  Post-Procedural bleeding risk per IR MD assessment:  Standard risk  Antithrombotic medications on inpatient or PTA profile prior to procedure:     Enoxaparin 40 mg SQ q24h for VTE prophylaxis stopped after 8/22 dose.    Recommended restart time per IR Post-Procedure Guidelines:    - the morning after the procedure   Plan:      Resume Enoxaparin 40 mg SQ q24hrs on 8/24 am.  Tamara [name] Phone: [phone] 04/19/2020 1:44 PM

## 2020-04-19 NOTE — Progress Notes (Signed)
PROGRESS NOTE    Tamara [name]  ZOX:[id number] DOB: [birth year] DOA: 04/02/2020 PCP: [name], NP   Brief Narrative: Tamara [name] is a 59 y.o. female with DM 2, hypertension, chronic kidney disease, asthma, seizure came in with confusion, worsening mental status, noncompliance with her medication, with symptoms progressing over the last several weeks.  Neurology has been consulted as well.  MRI of the brain    Assessment & Plan:   Principal Problem:   Acute metabolic encephalopathy Active Problems:   Asthma with acute exacerbation   Diabetes (HCC)   Acute encephalopathy   AKI (acute kidney injury) (HCC)   Protein-calorie malnutrition, severe  Wernicke's encephalopathy Thiamine deficiency Patient treated with high dose thiamine. MRI suggests possible subacute bilateral thalamic infarcts however, more likely Wernicke's encephalopathy -Continue thiamine, Keppra (IV until PEG is usable)  AKI on CKD stage IIIa Resolved  Hypernatremia Resolved  Hypokalemia Repleted. Resolved.  Hypomagnesemia Repleted. Resolved.  Non-anion gap metabolic acidosis Resolved.  Severe malnutrition Patient had an NG tube which she pulled out overnight. Plan to placed PEG tube today. -Await PEG tube to restart tube feeds -1/2 NS for maintenance IV fluids while feeding tube is removed/not usable  Essential hypertension Patient is on losartan as an outpatient. BP controlled.  Diabetes mellitus, type 2 Controlled. Hemoglobin A1C of 6.2%.   DVT prophylaxis: SCDs Code Status:   Code Status: Full Code Family Communication: None at bedside Disposition Plan: Discharge to SNF once PEG tube placed   Consultants:   Neurology  Interventional radiology  Procedures:   None  Antimicrobials:  None    Subjective: No concerns  Objective: Vitals:   04/18/20 1939 04/18/20 2320 04/19/20 0329 04/19/20 0805  BP: 116/75 120/69 118/73 123/75  Pulse: 88 79 81 73  Resp: 18 18 18 20    Temp: 98.5 F (36.9 C) 98.3 F (36.8 C) 98.1 F (36.7 C) 98.1 F (36.7 C)  TempSrc: Oral Oral Oral Axillary  SpO2: 100% 99% 99% 100%  Weight:   57 kg   Height:        Intake/Output Summary (Last 24 hours) at 04/19/2020 0912 Last data filed at 04/19/2020 0331 Gross per 24 hour  Intake 741 ml  Output 1200 ml  Net -459 ml   Filed Weights   04/18/20 0327 04/18/20 0647 04/19/20 0329  Weight: 57.2 kg (!) 136.7 kg 57 kg    Examination:  General exam: Appears calm and comfortable Respiratory system: Clear to auscultation. Respiratory effort normal. Cardiovascular system: S1 & S2 heard, RRR. No murmurs, rubs, gallops or clicks. Gastrointestinal system: Abdomen is nondistended, soft and nontender. No organomegaly or masses felt. Normal bowel sounds heard. Central nervous system: Alert and oriented to person only. Musculoskeletal: No edema. No calf tenderness Skin: No cyanosis. No rashes    Data Reviewed: I have personally reviewed following labs and imaging studies  CBC Lab Results  Component Value Date   WBC 10.1 04/17/2020   RBC 3.15 (L) 04/17/2020   HGB 9.1 (L) 04/17/2020   HCT 28.4 (L) 04/17/2020   MCV 90.2 04/17/2020   MCH 28.9 04/17/2020   PLT 294 04/17/2020   MCHC 32.0 04/17/2020   RDW 14.9 04/17/2020   LYMPHSABS 3.8 04/05/2020   MONOABS 0.8 04/05/2020   EOSABS 0.1 04/05/2020   BASOSABS 0.0 04/05/2020     Last metabolic panel Lab Results  Component Value Date   NA 138 04/17/2020   K 4.1 04/17/2020   CL 103 04/17/2020   CO2 24 04/17/2020  BUN 16 04/17/2020   CREATININE 0.91 04/17/2020   GLUCOSE 140 (H) 04/17/2020   GFRNONAA >60 04/17/2020   GFRAA >60 04/17/2020   CALCIUM 9.6 04/17/2020   PHOS 3.4 04/13/2020   PROT 5.6 (L) 04/14/2020   ALBUMIN 2.6 (L) 04/14/2020   BILITOT 0.2 (L) 04/14/2020   ALKPHOS 58 04/14/2020   AST 16 04/14/2020   ALT 24 04/14/2020   ANIONGAP 11 04/17/2020    CBG (last 3)  Recent Labs    04/18/20 2318 04/19/20 0327  04/19/20 0800  GLUCAP 106* 104* 92     GFR: Estimated Creatinine Clearance: 61.4 mL/min (by C-G formula based on SCr of 0.91 mg/dL).  Coagulation Profile: No results for input(s): INR, PROTIME in the last 168 hours.  No results found for this or any previous visit (from the past 240 hour(s)).      Radiology Studies: No results found.      Scheduled Meds: . feeding supplement (PROSource TF)  45 mL Per Tube BID  . folic acid  1 mg Per Tube Daily  . free water  200 mL Per Tube Q8H  . insulin aspart  0-9 Units Subcutaneous TID WC  . levETIRAcetam  500 mg Per Tube BID  . mometasone-formoterol  2 puff Inhalation BID  . multivitamin with minerals  1 tablet Per Tube Daily  . thiamine  100 mg Per Tube Daily   Continuous Infusions: .  ceFAZolin (ANCEF) IV    . feeding supplement (OSMOLITE 1.5 CAL) 1,000 mL (04/18/20 1254)     LOS: 17 days     Jacquelin Hawking, MD [facility] 04/19/2020, 9:12 AM  If 7PM-7AM, please contact night-coverage [email or web address]

## 2020-04-19 NOTE — Progress Notes (Signed)
PT Cancellation Note  Patient Details Name: Tamara [name] MRN: [id number] DOB: 1961-06-08   Cancelled Treatment:    Reason Eval/Treat Not Completed: Patient at procedure or test/unavailable   Off the floor for PEG;   Will follow up later today as time allows;  Otherwise, will follow up for PT tomorrow;   Thank you,  Van Clines, PT  Acute Rehabilitation Services Pager [phone] Office [phone]     Levi Aland 04/19/2020, 1:28 PM

## 2020-04-20 LAB — GLUCOSE, CAPILLARY
Glucose-Capillary: 132 mg/dL — ABNORMAL HIGH (ref 70–99)
Glucose-Capillary: 118 mg/dL — ABNORMAL HIGH (ref 70–99)
Glucose-Capillary: 112 mg/dL — ABNORMAL HIGH (ref 70–99)
Glucose-Capillary: 92 mg/dL (ref 70–99)
Glucose-Capillary: 93 mg/dL (ref 70–99)

## 2020-04-20 MED ORDER — LABETALOL HCL 5 MG/ML IV SOLN
10.0000 mg | INTRAVENOUS | Status: DC | PRN
  Administered 2020-04-20: 10 mg via INTRAVENOUS
  Filled 2020-04-20: qty 4

## 2020-04-20 MED ORDER — LEVETIRACETAM 100 MG/ML PO SOLN
500.0000 mg | Freq: Two times a day (BID) | ORAL | Status: DC
  Administered 2020-04-20 – 2020-04-23 (×6): 500 mg
  Filled 2020-04-20 (×6): qty 5

## 2020-04-20 MED ORDER — DIPHENHYDRAMINE HCL 25 MG PO CAPS
25.0000 mg | ORAL_CAPSULE | Freq: Four times a day (QID) | ORAL | Status: AC | PRN
  Administered 2020-04-20: 25 mg
  Filled 2020-04-20: qty 1

## 2020-04-20 NOTE — Progress Notes (Signed)
Osmolite 1.5 infusing through PEG at 36ml/hr. Order states to increase dose by 10 mL/hr every 8 hours in a goal of 50 is reach. Pt tolerating well. HOB elevated at or above 30 degrees. Will continue to monitor.

## 2020-04-20 NOTE — TOC Progression Note (Signed)
Transition of Care (TOC) - Progression Note    Patient Details  Name: Tamara [name] MRN: [id number] Date of Birth: 02/23/1961  Transition of Care (TOC) CM/SW Contact  Elsie , Student-Social Work Phone Number: 04/20/2020, 3:12 PM  Clinical Narrative:   MSW Intern met with daughter at bedside and discussed LOG SNF process and gave resource information on possible SNF options. Reached out to financial counseling to meet with daughter for medical screen and they will meet with her this afternoon. No bed offers at this time.       Expected Discharge Plan: Skilled Nursing Facility Barriers to Discharge: Inadequate or no insurance, Continued Medical Work up  Expected Discharge Plan and Services Expected Discharge Plan: Skilled Nursing Facility     Post Acute Care Choice: Skilled Nursing Facility Living arrangements for the past 2 months: Apartment                                       Social Determinants of Health (SDOH) Interventions    Readmission Risk Interventions No flowsheet data found.  

## 2020-04-20 NOTE — Progress Notes (Signed)
Physical Therapy Treatment Patient Details Name: Tamara [name] MRN: [id number] DOB: 1961-07-12 Today's Date: 04/20/2020    History of Present Illness 59 y.o. female with medical history significant for dm, htn, ckd, asthma, recent hospitalization (07/21) for unresponsiveness/AMS and diagnosed w/ seizure disorder and started on anticonvulsant. Presented to ED 04/02/20 with AMS after several weeks of poor oral intake. Found to have AKI and metabolic encephalopathy. Negative extended EEG, toxicology, CSF. MRI brain- Hyperintensity in the posteromedial thalami bilaterally (possibly infarction vs Wernicke's encephalopathy); low thiamine level    PT Comments    Patient awake and alert, however does not respond to purely verbal commands (although does state her name when asked). Requires visual and tactile cues and incr time to initiate action. Able to walk with RW and 1 person assist up to 12 ft.    Follow Up Recommendations  SNF;Supervision/Assistance - 24 hour     Equipment Recommendations  Rolling walker with 5" wheels;3in1 (PT)    Recommendations for Other Services       Precautions / Restrictions Precautions Precautions: Fall    Mobility  Bed Mobility Overal bed mobility: Needs Assistance Bed Mobility: Supine to Sit     Supine to sit: Max assist     General bed mobility comments: patient delayed to begin to assist with motion; not resisting, just seemed to not understand the task  Transfers Overall transfer level: Needs assistance Equipment used: Rolling walker (2 wheeled);2 person hand held assist Transfers: Sit to/from Stand Sit to Stand: Min assist         General transfer comment: Did better with RW in front of her (stabilized RW as she put one hand on it to come to stand); more of a posterior lean with HHA  Ambulation/Gait Ambulation/Gait assistance: Min assist Gait Distance (Feet): 12 Feet (10 x2; seated rests between; initially chair-follow x1;) Assistive  device: Rolling walker (2 wheeled);2 person hand held assist Gait Pattern/deviations: Step-through pattern;Narrow base of support (leaning forward with HHA, nearly scissored legs) Gait velocity: Decreased   General Gait Details: did better with RW (more upright) with assist for directing RW   Stairs   Stairs assistance: Min guard Stair Management: Forwards;Alternating pattern;Two rails   General stair comments: No assist required   Wheelchair Mobility    Modified Rankin (Stroke Patients Only)       Balance Overall balance assessment: Needs assistance Sitting-balance support: No upper extremity supported;Feet supported;Bilateral upper extremity supported Sitting balance-Leahy Scale: Fair     Standing balance support: During functional activity;No upper extremity supported Standing balance-Leahy Scale: Poor Standing balance comment: reliant on UE support                            Cognition Arousal/Alertness: Awake/alert Behavior During Therapy: Flat affect Overall Cognitive Status: No family/caregiver present to determine baseline cognitive functioning Area of Impairment: Orientation;Following commands                 Orientation Level: Disoriented to;Place;Time;Situation Current Attention Level: Focused;Sustained   Following Commands: Follows one step commands inconsistently;Follows one step commands with increased time     Problem Solving: Slow processing;Decreased initiation;Difficulty sequencing;Requires verbal cues;Requires tactile cues General Comments: pt smiling at therapist, can state her name, however wouldnt answer questions or speak much in session, required multi-modal cues for all movement      Exercises      General Comments General comments (skin integrity, edema, etc.): No attempts to verbalize other than  stating her name when asked.       Pertinent Vitals/Pain Pain Assessment: Faces Faces Pain Scale: Hurts little more Pain  Location: holding abdomen Pain Descriptors / Indicators: Guarding Pain Intervention(s): Monitored during session;Limited activity within patient's tolerance    Home Living                      Prior Function            PT Goals (current goals can now be found in the care plan section) Acute Rehab PT Goals Patient Stated Goal: patient unable to participate in goal setting Time For Goal Achievement: 04/21/20 Potential to Achieve Goals: Fair Progress towards PT goals: Progressing toward goals    Frequency    Min 2X/week      PT Plan Current plan remains appropriate    Co-evaluation              AM-PAC PT "6 Clicks" Mobility   Outcome Measure  Help needed turning from your back to your side while in a flat bed without using bedrails?: A Lot Help needed moving from lying on your back to sitting on the side of a flat bed without using bedrails?: A Lot Help needed moving to and from a bed to a chair (including a wheelchair)?: A Little Help needed standing up from a chair using your arms (e.g., wheelchair or bedside chair)?: A Little Help needed to walk in hospital room?: A Little Help needed climbing 3-5 steps with a railing? : Total 6 Click Score: 14    End of Session Equipment Utilized During Treatment: Gait belt Activity Tolerance: Patient tolerated treatment well Patient left: in chair;with call bell/phone within reach;with chair alarm set;with restraints reapplied (bil mitts) Nurse Communication: Mobility status;Other (comment) (up in  chair with alarm) PT Visit Diagnosis: Unsteadiness on feet (R26.81);Other abnormalities of gait and mobility (R26.89);Other symptoms and signs involving the nervous system (R29.898)     Time: 0272-5366 PT Time Calculation (min) (ACUTE ONLY): 30 min  Charges:  $Gait Training: 23-37 mins                      Jerolyn Center, PT Pager [phone]    Zena Amos 04/20/2020, 4:26 PM

## 2020-04-20 NOTE — Progress Notes (Signed)
BP 129/115 pt asymptomatic. Dr Antionette Char informed.

## 2020-04-20 NOTE — Plan of Care (Signed)

## 2020-04-20 NOTE — Progress Notes (Signed)
PROGRESS NOTE    Tamara [name]  LYY:[id number] DOB: December 08, 1960 DOA: 04/02/2020 PCP: Tamara Sharps, NP   Brief Narrative: Tamara [name] is a 59 y.o. female with DM 2, hypertension, chronic kidney disease, asthma, seizure came in with confusion, worsening mental status, noncompliance with her medication, with symptoms progressing over the last several weeks.  Neurology has been consulted as well.  MRI of the brain    Assessment & Plan:   Principal Problem:   Acute metabolic encephalopathy Active Problems:   Asthma with acute exacerbation   Diabetes (HCC)   Acute encephalopathy   AKI (acute kidney injury) (HCC)   Protein-calorie malnutrition, severe  Wernicke's encephalopathy Thiamine deficiency Patient treated with high dose thiamine. MRI suggests possible subacute bilateral thalamic infarcts however, more likely Wernicke's encephalopathy -Continue thiamine, Keppra  AKI on CKD stage IIIa Resolved.  Hypernatremia Resolved.  Hypokalemia Repleted. Resolved.  Hypomagnesemia Repleted. Resolved.  Non-anion gap metabolic acidosis Resolved.  Severe malnutrition Patient had an NG tube which she pulled out overnight. Plan to placed PEG tube today. -Await PEG tube to restart tube feeds -1/2 NS for maintenance IV fluids while feeding tube is removed/not usable  Essential hypertension Patient is on losartan as an outpatient. BP controlled.  Diabetes mellitus, type 2 Controlled. Hemoglobin A1C of 6.2%.  Acute urinary retention Foley placed on 8/10. No voiding trial performed. -Voiding trial; can use Purewick   DVT prophylaxis: SCDs Code Status:   Code Status: Full Code Family Communication: None at bedside. Daughter on telephone Disposition Plan: Discharge to SNF likely in 1-2 days after restarting tube feeds. Will need to ensure patient has a safe plan post-rehab. Will discuss with CM/CSW   Consultants:   Neurology  Interventional radiology  Procedures:    None  Antimicrobials:  None    Subjective: No concerns  Objective: Vitals:   04/19/20 2319 04/20/20 0325 04/20/20 0342 04/20/20 0804  BP: (!) 144/69 (!) 150/81  134/86  Pulse: 81 88  80  Resp: 18 18  18   Temp: 98.3 F (36.8 C) 98 F (36.7 C)  98.2 F (36.8 C)  TempSrc: Oral Oral  Oral  SpO2: 98% 99%  97%  Weight:   56.7 kg   Height:        Intake/Output Summary (Last 24 hours) at 04/20/2020 0954 Last data filed at 04/20/2020 0415 Gross per 24 hour  Intake 1122.66 ml  Output 1050 ml  Net 72.66 ml   Filed Weights   04/18/20 0647 04/19/20 0329 04/20/20 0342  Weight: (!) 136.7 kg 57 kg 56.7 kg    Examination:  General exam: Appears calm and comfortable Respiratory system: Clear to auscultation. Respiratory effort normal. Cardiovascular system: S1 & S2 heard, RRR. No murmurs, rubs, gallops or clicks. Gastrointestinal system: Abdomen is nondistended, soft and nontender. No organomegaly or masses felt. Normal bowel sounds heard. PEG tube site is unremarkable Central nervous system: Alert and oriented to person Musculoskeletal: No edema. No calf tenderness Skin: No cyanosis. No rashes    Data Reviewed: I have personally reviewed following labs and imaging studies  CBC Lab Results  Component Value Date   WBC 10.1 04/17/2020   RBC 3.15 (L) 04/17/2020   HGB 9.1 (L) 04/17/2020   HCT 28.4 (L) 04/17/2020   MCV 90.2 04/17/2020   MCH 28.9 04/17/2020   PLT 294 04/17/2020   MCHC 32.0 04/17/2020   RDW 14.9 04/17/2020   LYMPHSABS 3.8 04/05/2020   MONOABS 0.8 04/05/2020   EOSABS 0.1 04/05/2020  BASOSABS 0.0 04/05/2020     Last metabolic panel Lab Results  Component Value Date   NA 138 04/17/2020   K 4.1 04/17/2020   CL 103 04/17/2020   CO2 24 04/17/2020   BUN 16 04/17/2020   CREATININE 0.91 04/17/2020   GLUCOSE 140 (H) 04/17/2020   GFRNONAA >60 04/17/2020   GFRAA >60 04/17/2020   CALCIUM 9.6 04/17/2020   PHOS 3.4 04/13/2020   PROT 5.6 (L) 04/14/2020    ALBUMIN 2.6 (L) 04/14/2020   BILITOT 0.2 (L) 04/14/2020   ALKPHOS 58 04/14/2020   AST 16 04/14/2020   ALT 24 04/14/2020   ANIONGAP 11 04/17/2020    CBG (last 3)  Recent Labs    04/19/20 2318 04/20/20 0324 04/20/20 0801  GLUCAP 115* 132* 118*     GFR: Estimated Creatinine Clearance: 61.1 mL/min (by C-G formula based on SCr of 0.91 mg/dL).  Coagulation Profile: No results for input(s): INR, PROTIME in the last 168 hours.  No results found for this or any previous visit (from the past 240 hour(s)).      Radiology Studies: IR GASTROSTOMY TUBE MOD SED  Result Date: 04/19/2020 CLINICAL DATA:  Stroke, needs enteral feeding support EXAM: PERC PLACEMENT GASTROSTOMY FLUOROSCOPY TIME:  48 seconds; 2 mGy TECHNIQUE: The procedure, risks, benefits, and alternatives were explained to the daughter. Questions regarding the procedure were encouraged and answered. The daughter understands and consents to the procedure. As antibiotic prophylaxis, cefazolin 2 g was ordered pre-procedure and administered intravenously within one hour of incision. A safe percutaneous approach was confirmed on recent CT abdomen. A 5 French angiographic catheter was placed as orogastric tube. The upper abdomen was prepped with Betadine, draped in usual sterile fashion, and infiltrated locally with 1% lidocaine. Intravenous Fentanyl and Versed 1.5mg  were administered as conscious sedation during continuous monitoring of the patient's level of consciousness and physiological / cardiorespiratory status by the radiology RN, with a total moderate sedation time of 12 minutes. Stomach was insufflated using air through the orogastric tube. An 73 French sheath needle was advanced percutaneously into the gastric lumen under fluoroscopy. Gas could be aspirated and a small contrast injection confirmed intraluminal spread. The sheath was exchanged over a guidewire for a 9 [place] vascular sheath, through which the snare device was  advanced and used to snare a guidewire passed through the orogastric tube. This was withdrawn, and the snare attached to the 20 French pull-through gastrostomy tube, which was advanced antegrade, positioned with the internal bumper securing the anterior gastric wall to the anterior abdominal wall. Small contrast injection confirms appropriate positioning. The external bumper was applied and the catheter was flushed. COMPLICATIONS: COMPLICATIONS none IMPRESSION: 1. Technically successful 20 French pull-through gastrostomy placement under fluoroscopy. Electronically Signed   By: Corlis Leak M.D.   On: 04/19/2020 14:24        Scheduled Meds: . enoxaparin (LOVENOX) injection  40 mg Subcutaneous Q24H  . feeding supplement (PROSource TF)  45 mL Per Tube BID  . folic acid  1 mg Per Tube Daily  . free water  200 mL Per Tube Q8H  . insulin aspart  0-9 Units Subcutaneous TID WC  . mometasone-formoterol  2 puff Inhalation BID  . multivitamin with minerals  1 tablet Per Tube Daily  . thiamine  100 mg Per Tube Daily   Continuous Infusions: . dextrose 5 % and 0.45% NaCl 75 mL/hr at 04/19/20 2009  . feeding supplement (OSMOLITE 1.5 CAL) 1,000 mL (04/18/20 1254)  . levETIRAcetam  500 mg (04/20/20 0924)     LOS: 18 days     Jacquelin Hawking, MD [facility] 04/20/2020, 9:54 AM  If 7PM-7AM, please contact night-coverage [email or web address]

## 2020-04-20 NOTE — Progress Notes (Signed)
Referring Physician(s): Dr. Elvera Lennox  Supervising Physician: Simonne Come  Patient Status:  [facility] - In-pt  Chief Complaint: Dysphagia; gastrostomy tube placed 04/19/20.   Subjective: Patient in bed, asleep. She opened her eyes during my assessment. She did not follow any commands, she did not speak to me.   Allergies: Chlorhexidine  Medications: Prior to Admission medications   Medication Sig Start Date End Date Taking? Authorizing Provider  Acetaminophen 500 MG capsule Take 1,000 mg by mouth 3 (three) times daily as needed for pain. 01/27/20  Yes [provider]  albuterol (PROVENTIL HFA;VENTOLIN HFA) 108 (90 BASE) MCG/ACT inhaler Inhale 2 puffs into the lungs every 6 (six) hours as needed for wheezing. 10/04/12  Yes Johnson, Clanford L, MD  aspirin EC 81 MG tablet Take 81 mg by mouth daily.   Yes [provider]  buPROPion (WELLBUTRIN SR) 150 MG 12 hr tablet Take 150 mg by mouth daily.   Yes [provider]  Fluticasone-Salmeterol (ADVAIR) 250-50 MCG/DOSE AEPB Inhale 1 puff into the lungs 2 (two) times daily.   Yes [provider]  gabapentin (NEURONTIN) 300 MG capsule Take 1 capsule (300 mg total) by mouth 3 (three) times daily. 03/04/20  Yes Danford, Earl Lites, MD  LANTUS SOLOSTAR 100 UNIT/ML Solostar Pen Inject 20 Units into the skin 2 (two) times daily. 01/27/20  Yes [provider]  levETIRAcetam (KEPPRA) 500 MG tablet Take 1 tablet (500 mg total) by mouth 2 (two) times daily. 03/03/20  Yes Danford, Earl Lites, MD  loratadine (CLARITIN) 10 MG tablet Take 1 tablet (10 mg total) by mouth daily. 01/14/13  Yes Ghimire, Werner Lean, MD  losartan (COZAAR) 100 MG tablet Take 100 mg by mouth daily.   Yes [provider]  metFORMIN (GLUCOPHAGE) 1000 MG tablet Take 1,000 mg by mouth 2 (two) times daily with a meal.   Yes [provider]  atorvastatin (LIPITOR) 80 MG tablet Take 40 mg by mouth at bedtime.     [provider]   folic acid (FOLVITE) 1 MG tablet Take 1 tablet (1 mg total) by mouth daily. 03/04/20   Danford, Earl Lites, MD  magnesium 30 MG tablet Take 1 tablet (30 mg total) by mouth daily. 02/15/20   Terrilee Files, MD  [facility] 17 GM/SCOOP powder Take 17 g by mouth at bedtime. 01/27/20   [provider]  montelukast (SINGULAIR) 10 MG tablet Take 1 tablet (10 mg total) by mouth at bedtime. 10/04/12   Johnson, Clanford L, MD  omeprazole (PRILOSEC) 20 MG capsule Take 20 mg by mouth daily.    [provider]     Vital Signs: BP (!) 148/97 (BP Location: Left Arm)   Pulse 80   Temp 98.5 F (36.9 C) (Oral)   Resp 18   Ht 5\' 7"  (1.702 m)   Wt 125 lb (56.7 kg)   SpO2 100%   BMI 19.58 kg/m   Physical Exam Constitutional:      General: She is not in acute distress.    Comments: Asleep but opened eyes during assessment.   Pulmonary:     Effort: Pulmonary effort is normal.  Abdominal:     Palpations: Abdomen is soft.     Comments: Gastrostomy tube in place. Dressing is clean and dry. Abdominal binder in place.   Skin:    General: Skin is warm and dry.     Imaging: IR GASTROSTOMY TUBE MOD SED  Result Date: 04/19/2020 CLINICAL DATA:  Stroke, needs  enteral feeding support EXAM: PERC PLACEMENT GASTROSTOMY FLUOROSCOPY TIME:  48 seconds; 2 mGy TECHNIQUE: The procedure, risks, benefits, and alternatives were explained to the daughter. Questions regarding the procedure were encouraged and answered. The daughter understands and consents to the procedure. As antibiotic prophylaxis, cefazolin 2 g was ordered pre-procedure and administered intravenously within one hour of incision. A safe percutaneous approach was confirmed on recent CT abdomen. A 5 French angiographic catheter was placed as orogastric tube. The upper abdomen was prepped with Betadine, draped in usual sterile fashion, and infiltrated locally with 1% lidocaine. Intravenous Fentanyl and Versed 1.5mg  were administered as  conscious sedation during continuous monitoring of the patient's level of consciousness and physiological / cardiorespiratory status by the radiology RN, with a total moderate sedation time of 12 minutes. Stomach was insufflated using air through the orogastric tube. An 88 French sheath needle was advanced percutaneously into the gastric lumen under fluoroscopy. Gas could be aspirated and a small contrast injection confirmed intraluminal spread. The sheath was exchanged over a guidewire for a 9 [place] vascular sheath, through which the snare device was advanced and used to snare a guidewire passed through the orogastric tube. This was withdrawn, and the snare attached to the 20 French pull-through gastrostomy tube, which was advanced antegrade, positioned with the internal bumper securing the anterior gastric wall to the anterior abdominal wall. Small contrast injection confirms appropriate positioning. The external bumper was applied and the catheter was flushed. COMPLICATIONS: COMPLICATIONS none IMPRESSION: 1. Technically successful 20 French pull-through gastrostomy placement under fluoroscopy. Electronically Signed   By: Corlis Leak M.D.   On: 04/19/2020 14:24    Labs:  CBC: Recent Labs    04/05/20 0311 04/06/20 0223 04/07/20 0430 04/17/20 0300  WBC 11.5* 9.4 9.1 10.1  HGB 9.9* 9.1* 8.9* 9.1*  HCT 32.2* 28.4* 26.9* 28.4*  PLT 267 261 237 294    COAGS: Recent Labs    02/15/20 2023 03/01/20 0724 04/03/20 0918  INR 1.1 1.2 1.3*  APTT  --  28  --     BMP: Recent Labs    04/12/20 0231 04/13/20 0428 04/14/20 0141 04/17/20 0300  NA 140 140 139 138  K 3.4* 4.2 4.2 4.1  CL 110 111 109 103  CO2 19* 21* 24 24  GLUCOSE 69* 153* 94 140*  BUN <5* 7 9 16   CALCIUM 9.5 9.2 9.4 9.6  CREATININE 1.01* 1.06* 0.93 0.91  GFRNONAA >60 58* >60 >60  GFRAA >60 >60 >60 >60    LIVER FUNCTION TESTS: Recent Labs    04/03/20 0918 04/04/20 1608 04/06/20 0223 04/09/20 0203 04/14/20 0141   BILITOT 0.7  --  0.4 0.1* 0.2*  AST 27  --  18 23 16   ALT 23  --  14 21 24   ALKPHOS 72  --  48 49 58  PROT 8.1  --  5.5* 5.3* 5.6*  ALBUMIN 4.1 3.7* 2.6* 2.5* 2.6*    Assessment and Plan:  Dysphagia: Secondary to encephalopathy, s/p gastrostomy tube placement in IR. Site is clean and dry, patient has been receiving medications and nutritional supplements without difficulty.   IR will sign off.    Electronically Signed: 04/16/20, AGACNP-BC [phone] 04/20/2020, 1:37 PM   I spent a total of 15 Minutes at the the patient's bedside AND on the patient's hospital floor or unit, greater than 50% of which was counseling/coordinating care for g-tube placement.

## 2020-04-20 NOTE — Progress Notes (Signed)
MD wrote orders to d/c indwelling catheter. Catheter removed without any difficulty. Pt reminded to use her call button for help when she feels the urge to urinate. Call button is within reach. Pt HOB elevated at or above 30 degrees. Bed at lowest position and bed alarm is set.

## 2020-04-21 LAB — BASIC METABOLIC PANEL
Anion gap: 9 (ref 5–15)
BUN: 14 mg/dL (ref 6–20)
CO2: 28 mmol/L (ref 22–32)
Calcium: 9.6 mg/dL (ref 8.9–10.3)
Chloride: 100 mmol/L (ref 98–111)
Creatinine, Ser: 0.93 mg/dL (ref 0.44–1.00)
GFR calc Af Amer: >60 mL/min (ref >60)
GFR calc non Af Amer: >60 mL/min (ref >60)
Glucose, Bld: 143 mg/dL — ABNORMAL HIGH (ref 70–99)
Potassium: 3.3 mmol/L — ABNORMAL LOW (ref 3.5–5.1)
Sodium: 137 mmol/L (ref 135–145)

## 2020-04-21 LAB — CBC
HCT: 27.2 % — ABNORMAL LOW (ref 36.0–46.0)
Hemoglobin: 8.8 g/dL — ABNORMAL LOW (ref 12.0–15.0)
MCH: 29.7 pg (ref 26.0–34.0)
MCHC: 32.4 g/dL (ref 30.0–36.0)
MCV: 91.9 fL (ref 80.0–100.0)
Platelets: 289 10*3/uL (ref 150–400)
RBC: 2.96 MIL/uL — ABNORMAL LOW (ref 3.87–5.11)
RDW: 14.4 % (ref 11.5–15.5)
WBC: 8.2 10*3/uL (ref 4.0–10.5)
nRBC: 0 % (ref 0.0–0.2)

## 2020-04-21 LAB — GLUCOSE, CAPILLARY
Glucose-Capillary: 112 mg/dL — ABNORMAL HIGH (ref 70–99)
Glucose-Capillary: 119 mg/dL — ABNORMAL HIGH (ref 70–99)
Glucose-Capillary: 127 mg/dL — ABNORMAL HIGH (ref 70–99)
Glucose-Capillary: 130 mg/dL — ABNORMAL HIGH (ref 70–99)
Glucose-Capillary: 144 mg/dL — ABNORMAL HIGH (ref 70–99)
Glucose-Capillary: 146 mg/dL — ABNORMAL HIGH (ref 70–99)
Glucose-Capillary: 155 mg/dL — ABNORMAL HIGH (ref 70–99)

## 2020-04-21 LAB — PHOSPHORUS: Phosphorus: 4.4 mg/dL (ref 2.5–4.6)

## 2020-04-21 LAB — MAGNESIUM: Magnesium: 1.3 mg/dL — ABNORMAL LOW (ref 1.7–2.4)

## 2020-04-21 MED ORDER — MAGNESIUM SULFATE 4 GM/100ML IV SOLN
4.0000 g | Freq: Once | INTRAVENOUS | Status: AC
  Administered 2020-04-21: 4 g via INTRAVENOUS
  Filled 2020-04-21: qty 100

## 2020-04-21 MED ORDER — POTASSIUM CHLORIDE 20 MEQ/15ML (10%) PO SOLN
40.0000 meq | Freq: Two times a day (BID) | ORAL | Status: AC
  Administered 2020-04-21 (×2): 40 meq
  Filled 2020-04-21 (×2): qty 30

## 2020-04-21 NOTE — Progress Notes (Signed)
Pt's bladder scan 88cc and have not voided. Dr. Antionette Char informed. Care nurse will recheck at 0400.

## 2020-04-21 NOTE — Progress Notes (Signed)
Nutrition Follow-up  DOCUMENTATION CODES:   Severe malnutrition in context of acute illness/injury  INTERVENTION:  Via PEG: -ContinueOsmolite 1.5@ 49m/hr -473mProsource TF BID -200 ml free water flush every8hours(or per MD)  Tube feeding regimen provides1880kcal (100% of needs),97grams of protein, and 91463mf H2O. Total free water:1514m75mily   NUTRITION DIAGNOSIS:   Severe Malnutrition related to acute illness as evidenced by energy intake < or equal to 50% for > or equal to 5 days, moderate fat depletion, moderate muscle depletion, percent weight loss.  Ongoing  GOAL:   Patient will meet greater than or equal to 90% of their needs  Met with TF  MONITOR:   PO intake, Supplement acceptance, Diet advancement, Weight trends, Labs, I & O's  REASON FOR ASSESSMENT:   Consult Enteral/tube feeding initiation and management  ASSESSMENT:   Pt presented with acute toxic metabolic encephalopathy. PMH includes DM, HTN, CKD, asthma, recent hospitalization for unresponsiveness/[facility] where pt was diagnosed with seizure disorder.  8/12 s/p BSE recommend thin liquids 8/16 Cortrak placed (gastric) 8/20 Cortrak replaced (gastric) 8/23 s/p PEG placement  Pt alert but minimally interactive.   Pt receiving TF via PEG. Per RN, rate is currently at 40ml31mand pt is tolerating well. RN plans to advance TF to goal around 1500 today.  Current TF orders: Osmolite 1.5 cal @ 50ml/75m45ml P68murce TF BID, 200ml fr71mater Q8H  Labs: K+ 3.3 (L), Mg 1.3 (L), CBGs [phone]: Folvite, MVI, Novolog, Thiamine, KCl 40mEq BI85mDiet Order:   Diet Order            Diet NPO time specified Except for: Ice Chips  Diet effective now                 EDUCATION NEEDS:   Not appropriate for education at this time  Skin:  Skin Assessment: Reviewed RN Assessment  Last BM:  8/22  Height:   Ht Readings from Last 1 Encounters:  04/02/20 '5\' 7"'  (1.702 m)     Weight:   Wt Readings from Last 1 Encounters:  04/21/20 57.6 kg   BMI:  Body mass index is 19.89 kg/m.  Estimated Nutritional Needs:   Kcal:  [id number]:  90-105 grams  Fluid:  >/=1.75L/d    Avnoor Koury AvLarkin Ina LDN RD pager number and weekend/on-call pager number located in [email or web address]

## 2020-04-21 NOTE — Progress Notes (Signed)
Pt's potassium 3.3 and bladder scan now 270cc. Pt has not voided. Dr. Antionette Char upated.

## 2020-04-21 NOTE — Plan of Care (Signed)

## 2020-04-21 NOTE — Progress Notes (Signed)
Pt's TF rate increased by 10 cc/hr at 2315, rate now 30 cc/hr. Pt tolerating well.

## 2020-04-21 NOTE — Progress Notes (Signed)
TF rate increased by 10cc at 0715, now rate 40cc/hr. Next rate change in 8 hrs.

## 2020-04-21 NOTE — Progress Notes (Signed)
PROGRESS NOTE    Tamara [name]  FFM:[id number] DOB: 24-Aug-1961 DOA: 04/02/2020 PCP: [name], NP  Brief Narrative:  The patient is a 59 year old African-American female with a past medical history significant for but not limited to diabetes mellitus type 2, hypertension, chronic kidney disease, asthma, seizure disorder who came in with confusion, worsening mental status and noncompliant with her medications.  Symptoms progressed over the last several weeks and neurology was consulted and she had an MRI of the brain was suggested possible subacute bilateral thalamic infarcts however her presentation was most consistent with Wernicke's encephalopathy.  She was started on thiamine and will be continued on Keppra.  Because of her encephalopathy she had dysphagia and had to have a PEG tube placement is currently tolerating tube feedings well.  Currently the patient has been deemed medically stable however she has no safe discharge plan given lack of insurance needing rehabilitation.  The caseworker and case manager are currently heavily involved in assisting with placement.   Assessment & Plan:   Principal Problem:   Acute metabolic encephalopathy Active Problems:   Asthma with acute exacerbation   Diabetes (HCC)   Acute encephalopathy   AKI (acute kidney injury) (HCC)   Protein-calorie malnutrition, severe  Wernicke's Encephalopathy Thiamine Deficiency -Patient treated with high dose thiamine. MRI suggests possible subacute bilateral thalamic infarcts however, more likely Wernicke's encephalopathy -Continue Thiamine at 100 mg per Tube Daily, Keppra 500 mg per Tube BID  AKI on CKD stage IIIa -Resolved. -Patient's BUN and creatinine is improved and is now 14/0.93 -Avoid nephrotoxic medications, contrast dyes, hypotension and renally dose medications -Repeat CMP in a.m.  Hypernatremia -Resolved. -Continue with free water flushes per dietary recommendations-patient sodium is now  137 Potassium monitor and trend and repeat CMP in a.m.  Non-anion gap metabolic acidosis -Resolved. -Patient's CO2 is now 28, chloride level level is 100, and anion gap is 9-continue monitor and trend repeat CMP in a.m.  Severe Protien Calorie malnutrition in the context of Acute illness -Patient had an NG tube which she pulled and has PEG Placed now - PEG tube to restart tube feeds -Currently now on Osmolite 1.5 at 50 mL/hr with 45 mL Prosource TF BID and 200 mL free Water Flushes q8h  Hypomagnesemia -Patient's magnesium level is 1.3 -Replete with IV mag sulfate 4 g  -Continue to monitor and replete as necessary -Repeat magnesium level in a.m.  Hypokalemia -Patient's potassium this morning was 3.3 -Replete with p.o. KCl 40 close twice daily via PEG tube -Continue to monitor and replete as necessary -Repeat CMP in the a.m.  Essential Hypertension -Patient is on losartan as an outpatient. BP controlled here -C/w Labetolol 10 mg IV q2hprn SBP>180 or DBP>100  Diabetes mellitus, type 2 -Controlled.  -Hemoglobin A1C of 6.2% -CBG's ranging from 112-146 and blood glucose was 143 on BMP -C/w Sensitive Novolog SSI TID  Acute Urinary Retention -Foley placed on 8/10. No voiding trial performed. -Foley now removed -Continue to Monitor Output  Normocytic Anemia -Patient's Hgb/Hct is now 8.8/27.2 and relatively stable -Check Anemia Panel in the AM  -Continue to Monitor for S/Sx of Bleeding; Currently no overt bleeding noted -Repeat CBC in the AM   DVT prophylaxis: Enoxaparin 40 mg sq q24h Code Status: FULL CODE Family Communication: No family present at bedside  Disposition Plan: SNF but has no insurance  Status is: Inpatient  Remains inpatient appropriate because:Unsafe d/c plan and Inpatient level of care appropriate due to severity of illness   Dispo: The  patient is from: Home              Anticipated d/c is to: SNF              Anticipated d/c date is: 2 days               Patient currently is medically stable to d/c.  Consultants:   Neurology  Interventional Radiology   Procedures: PEG placement   Antimicrobials:  Anti-infectives (From admission, onward)   Start     Dose/Rate Route Frequency Ordered Stop   04/19/20 0000  ceFAZolin (ANCEF) IVPB 2g/100 mL premix        2 g 200 mL/hr over 30 Minutes Intravenous To Radiology 04/16/20 1438 04/19/20 1333   04/02/20 2030  cefTRIAXone (ROCEPHIN) 1 g in sodium chloride 0.9 % 100 mL IVPB        1 g 200 mL/hr over 30 Minutes Intravenous Every 24 hours 04/02/20 2020 04/06/20 2159     Subjective: Seen and examined and she remains somewhat confused.  Unable to verbalize very much but does answer some questions.  Unable to provide a subjective history given her current condition.  Denies any complaints.  No other concerns or plans at this time.  Objective: Vitals:   04/21/20 0742 04/21/20 0823 04/21/20 1142 04/21/20 1535  BP: 123/77  (!) 142/88 (!) 134/91  Pulse: 84 83 80 94  Resp: 18 17 18 18   Temp: 98.2 F (36.8 C)  97.9 F (36.6 C) 98.1 F (36.7 C)  TempSrc: Axillary  Axillary Axillary  SpO2: 99% 98% 100% 93%  Weight:      Height:        Intake/Output Summary (Last 24 hours) at 04/21/2020 1711 Last data filed at 04/21/2020 04/23/2020 Gross per 24 hour  Intake 559.33 ml  Output 500 ml  Net 59.33 ml   Filed Weights   04/19/20 0329 04/20/20 0342 04/21/20 0339  Weight: 57 kg 56.7 kg 57.6 kg   Examination: Physical Exam:  Constitutional: Thin African-American female currently in NAD and appears calm and comfortable but remains confused Eyes: Lids and conjunctivae normal, sclerae anicteric  ENMT: External Ears, Nose appear normal. Grossly normal hearing.  Neck: Appears normal, supple, no cervical masses, normal ROM, no appreciable thyromegaly; no JVD Respiratory: Diminished to auscultation bilaterally, no wheezing, rales, rhonchi or crackles. Normal respiratory effort and patient is not  tachypenic. No accessory muscle use.  Unlabored breathing Cardiovascular: RRR, no murmurs / rubs / gallops. S1 and S2 auscultated.  Abdomen: Soft, non-tender, non-distended. Bowel sounds positive x4 and has a PEG tube in place.  GU: Deferred. Musculoskeletal: No clubbing / cyanosis of digits/nails. No joint deformity upper and lower extremities.  Skin: No rashes, lesions, ulcers on limited skin evaluation. No induration; Warm and dry.  Neurologic: She remains somewhat confused and limited following commands. Psychiatric: Impaired judgment and insight.  She is awake but not alert and oriented x 3. Normal mood and appropriate affect.   Data Reviewed: I have personally reviewed following labs and imaging studies  CBC: Recent Labs  Lab 04/17/20 0300 04/21/20 0142  WBC 10.1 8.2  HGB 9.1* 8.8*  HCT 28.4* 27.2*  MCV 90.2 91.9  PLT 294 289   Basic Metabolic Panel: Recent Labs  Lab 04/17/20 0300 04/21/20 0142 04/21/20 0755  NA 138 137  --   K 4.1 3.3*  --   CL 103 100  --   CO2 24 28  --   GLUCOSE 140* 143*  --  BUN 16 14  --   CREATININE 0.91 0.93  --   CALCIUM 9.6 9.6  --   MG  --   --  1.3*  PHOS  --   --  4.4   GFR: Estimated Creatinine Clearance: 60.7 mL/min (by C-G formula based on SCr of 0.93 mg/dL). Liver Function Tests: No results for input(s): AST, ALT, ALKPHOS, BILITOT, PROT, ALBUMIN in the last 168 hours. No results for input(s): LIPASE, AMYLASE in the last 168 hours. No results for input(s): AMMONIA in the last 168 hours. Coagulation Profile: No results for input(s): INR, PROTIME in the last 168 hours. Cardiac Enzymes: No results for input(s): CKTOTAL, CKMB, CKMBINDEX, TROPONINI in the last 168 hours. BNP (last 3 results) No results for input(s): PROBNP in the last 8760 hours. HbA1C: No results for input(s): HGBA1C in the last 72 hours. CBG: Recent Labs  Lab 04/21/20 0004 04/21/20 0339 04/21/20 0751 04/21/20 1141 04/21/20 1542  GLUCAP 155* 127* 146*  112* 130*   Lipid Profile: No results for input(s): CHOL, HDL, LDLCALC, TRIG, CHOLHDL, LDLDIRECT in the last 72 hours. Thyroid Function Tests: No results for input(s): TSH, T4TOTAL, FREET4, T3FREE, THYROIDAB in the last 72 hours. Anemia Panel: No results for input(s): VITAMINB12, FOLATE, FERRITIN, TIBC, IRON, RETICCTPCT in the last 72 hours. Sepsis Labs: No results for input(s): PROCALCITON, LATICACIDVEN in the last 168 hours.  No results found for this or any previous visit (from the past 240 hour(s)).   RN Pressure Injury Documentation:     Estimated body mass index is 19.89 kg/m as calculated from the following:   Height as of this encounter: 5\' 7"  (1.702 m).   Weight as of this encounter: 57.6 kg.  Malnutrition Type:  Nutrition Problem: Severe Malnutrition Etiology: acute illness   Malnutrition Characteristics:  Signs/Symptoms: energy intake < or equal to 50% for > or equal to 5 days, moderate fat depletion, moderate muscle depletion, percent weight loss Percent weight loss: 21.5 %   Nutrition Interventions:  Interventions: Boost Breeze, MVI   Radiology Studies: No results found.   Scheduled Meds: . enoxaparin (LOVENOX) injection  40 mg Subcutaneous Q24H  . feeding supplement (PROSource TF)  45 mL Per Tube BID  . folic acid  1 mg Per Tube Daily  . free water  200 mL Per Tube Q8H  . insulin aspart  0-9 Units Subcutaneous TID WC  . levETIRAcetam  500 mg Per Tube BID  . mometasone-formoterol  2 puff Inhalation BID  . multivitamin with minerals  1 tablet Per Tube Daily  . potassium chloride  40 mEq Per Tube BID  . thiamine  100 mg Per Tube Daily   Continuous Infusions: . feeding supplement (OSMOLITE 1.5 CAL) 50 mL/hr at 04/21/20 1518    LOS: 19 days   04/23/20, DO [facility] PAGER is on AMION  If 7PM-7AM, please contact night-coverage [email or web address]

## 2020-04-21 NOTE — Progress Notes (Signed)
The tube feedings have been increased per order by 46ml after 8hrs of tolerated feedings, to the goal rate of 49mL/hr.

## 2020-04-22 LAB — CBC WITH DIFFERENTIAL/PLATELET
Abs Immature Granulocytes: 0.02 10*3/uL (ref 0.00–0.07)
Basophils Absolute: 0 10*3/uL (ref 0.0–0.1)
Basophils Relative: 0 %
Eosinophils Absolute: 0.2 10*3/uL (ref 0.0–0.5)
Eosinophils Relative: 3 %
HCT: 27.4 % — ABNORMAL LOW (ref 36.0–46.0)
Hemoglobin: 9 g/dL — ABNORMAL LOW (ref 12.0–15.0)
Immature Granulocytes: 0 %
Lymphocytes Relative: 30 %
Lymphs Abs: 2.1 10*3/uL (ref 0.7–4.0)
MCH: 29.9 pg (ref 26.0–34.0)
MCHC: 32.8 g/dL (ref 30.0–36.0)
MCV: 91 fL (ref 80.0–100.0)
Monocytes Absolute: 0.6 10*3/uL (ref 0.1–1.0)
Monocytes Relative: 9 %
Neutro Abs: 4.1 10*3/uL (ref 1.7–7.7)
Neutrophils Relative %: 58 %
Platelets: 302 10*3/uL (ref 150–400)
RBC: 3.01 MIL/uL — ABNORMAL LOW (ref 3.87–5.11)
RDW: 14.4 % (ref 11.5–15.5)
WBC: 7 10*3/uL (ref 4.0–10.5)
nRBC: 0 % (ref 0.0–0.2)

## 2020-04-22 LAB — COMPREHENSIVE METABOLIC PANEL
ALT: 22 U/L (ref 0–44)
AST: 21 U/L (ref 15–41)
Albumin: 2.7 g/dL — ABNORMAL LOW (ref 3.5–5.0)
Alkaline Phosphatase: 67 U/L (ref 38–126)
Anion gap: 8 (ref 5–15)
BUN: 12 mg/dL (ref 6–20)
CO2: 28 mmol/L (ref 22–32)
Calcium: 9.6 mg/dL (ref 8.9–10.3)
Chloride: 104 mmol/L (ref 98–111)
Creatinine, Ser: 0.84 mg/dL (ref 0.44–1.00)
GFR calc Af Amer: >60 mL/min (ref >60)
GFR calc non Af Amer: >60 mL/min (ref >60)
Glucose, Bld: 164 mg/dL — ABNORMAL HIGH (ref 70–99)
Potassium: 4.2 mmol/L (ref 3.5–5.1)
Sodium: 140 mmol/L (ref 135–145)
Total Bilirubin: 0.4 mg/dL (ref 0.3–1.2)
Total Protein: 6.1 g/dL — ABNORMAL LOW (ref 6.5–8.1)

## 2020-04-22 LAB — RETICULOCYTES
Immature Retic Fract: 16.4 % — ABNORMAL HIGH (ref 2.3–15.9)
RBC.: 3.01 MIL/uL — ABNORMAL LOW (ref 3.87–5.11)
Retic Count, Absolute: 95.7 10*3/uL (ref 19.0–186.0)
Retic Ct Pct: 3.2 % — ABNORMAL HIGH (ref 0.4–3.1)

## 2020-04-22 LAB — MAGNESIUM: Magnesium: 2.4 mg/dL (ref 1.7–2.4)

## 2020-04-22 LAB — PHOSPHORUS: Phosphorus: 3.9 mg/dL (ref 2.5–4.6)

## 2020-04-22 LAB — GLUCOSE, CAPILLARY
Glucose-Capillary: 163 mg/dL — ABNORMAL HIGH (ref 70–99)
Glucose-Capillary: 123 mg/dL — ABNORMAL HIGH (ref 70–99)
Glucose-Capillary: 108 mg/dL — ABNORMAL HIGH (ref 70–99)
Glucose-Capillary: 111 mg/dL — ABNORMAL HIGH (ref 70–99)
Glucose-Capillary: 131 mg/dL — ABNORMAL HIGH (ref 70–99)
Glucose-Capillary: 116 mg/dL — ABNORMAL HIGH (ref 70–99)

## 2020-04-22 LAB — IRON AND TIBC
Iron: 32 ug/dL (ref 28–170)
Saturation Ratios: 15 % (ref 10.4–31.8)
TIBC: 218 ug/dL — ABNORMAL LOW (ref 250–450)
UIBC: 186 ug/dL

## 2020-04-22 LAB — SARS CORONAVIRUS 2 (TAT 6-24 HRS): SARS Coronavirus 2: NEGATIVE

## 2020-04-22 LAB — FERRITIN: Ferritin: 195 ng/mL (ref 11–307)

## 2020-04-22 LAB — FOLATE: Folate: 16.7 ng/mL (ref >5.9)

## 2020-04-22 LAB — VITAMIN B12: Vitamin B-12: 449 pg/mL (ref 180–914)

## 2020-04-22 NOTE — Plan of Care (Signed)

## 2020-04-22 NOTE — TOC Progression Note (Signed)
Transition of Care [facility]) - Progression Note    Patient Details  Name: Tamara [name] MRN: [id number] Date of Birth: 1960/12/11  Transition of Care [facility]) CM/SW Contact  Baldemar Lenis, [place] Phone Number: 04/22/2020, 4:41 PM  Clinical Narrative:   CSW following for discharge plans. CSW spoke with admissions at [facility], [place], and [address], and patient has received bed offer at [facility]. [facility] and [place] unable to offer at this time. CSW discussed with daughter and she is agreeable. Bed will be available for patient tomorrow.  CSW also received letter from financial counseling for disability completion, had MD sign and sent back. Medicaid and Disability are in process for patient at this time.    Expected Discharge Plan: Skilled Nursing Facility Barriers to Discharge: Inadequate or no insurance, Continued Medical Work up  Expected Discharge Plan and Services Expected Discharge Plan: Skilled Nursing Facility     Post Acute Care Choice: Skilled Nursing Facility Living arrangements for the past 2 months: Apartment                                       Social Determinants of Health (SDOH) Interventions    Readmission Risk Interventions No flowsheet data found.

## 2020-04-22 NOTE — Progress Notes (Signed)
PROGRESS NOTE    Tamara [name]  JJH:[id number] DOB: [birth year] DOA: 04/02/2020 PCP: [name], NP  Brief Narrative:  The patient is a 59 year old African-American female with a past medical history significant for but not limited to diabetes mellitus type 2, hypertension, chronic kidney disease, asthma, seizure disorder who came in with confusion, worsening mental status and noncompliant with her medications.  Symptoms progressed over the last several weeks and neurology was consulted and she had an MRI of the brain was suggested possible subacute bilateral thalamic infarcts however her presentation was most consistent with Wernicke's encephalopathy.  She was started on thiamine and will be continued on Keppra.  Because of her encephalopathy she had dysphagia and had to have a PEG tube placement is currently tolerating tube feedings well.  Currently the patient has been deemed medically stable however she has no safe discharge plan given lack of insurance needing rehabilitation.  The caseworker and case manager are currently heavily involved in assisting with placement.   Assessment & Plan:   Principal Problem:   Acute metabolic encephalopathy Active Problems:   Asthma with acute exacerbation   Diabetes (HCC)   Acute encephalopathy   AKI (acute kidney injury) (HCC)   Protein-calorie malnutrition, severe  Wernicke's Encephalopathy Thiamine Deficiency -Patient treated with high dose thiamine. MRI suggests possible subacute bilateral thalamic infarcts however, more likely Wernicke's encephalopathy -Continue Thiamine at 100 mg per Tube Daily, Keppra 500 mg per Tube BID  AKI on CKD stage IIIa -Resolved. -Patient's BUN and creatinine is improved and is now 12/0.84 -Avoid nephrotoxic medications, contrast dyes, hypotension and renally dose medications -Repeat CMP in a.m.  Hypernatremia -Resolved. -Continue with free water flushes per dietary recommendations -patient sodium is now  140 -Continue to Monitor and trend and repeat BMP in a.m.  Non-Anion Gap Metabolic Acidosis -Resolved. -Patient's CO2 is now 28, chloride level level is 104, and anion gap is 8 -Continue monitor and trend repeat BMP in a.m.  Severe Protien Calorie malnutrition in the context of Acute illness -Patient had an NG tube which she pulled and has PEG Placed now - PEG tube to restart tube feeds -Currently now on Osmolite 1.5 at 50 mL/hr with 45 mL Prosource TF BID and 200 mL free Water Flushes q8h  Hypomagnesemia -Patient's magnesium level was 1.3 and is now 2.4 -Replete with IV mag sulfate 4 g yesterday  -Continue to monitor and replete as necessary -Repeat magnesium level in a.m.  Hypokalemia -Patient's potassium was 3.3 and is now 4.2 -Continue to monitor and replete as necessary -Repeat CMP in the a.m.  Essential Hypertension -Patient is on Losartan as an outpatient. BP controlled here -C/w Labetolol 10 mg IV q2hprn SBP>180 or DBP>100  Diabetes Mellitus, Type 2 -Controlled.  -Hemoglobin A1C of 6.2% -CBG's ranging from 112-163 and blood glucose was 164 on CMP -C/w Sensitive Novolog SSI TID  Acute Urinary Retention -Foley placed on 8/10. No voiding trial performed. -Foley now removed -Continue to Monitor Output  Normocytic Anemia -Patient's Hgb/Hct yesterday was 8.8/27.2 and relatively stable and today is 9.0/27.4 -Checked Anemia Panel and showed an iron level of 32, U IBC 186, TIBC of 218, saturation ratios of 15%, ferritin level 195, folate level 16.7, and vitamin B12 level of 449 -Continue to Monitor for S/Sx of Bleeding; Currently no overt bleeding noted -Repeat CBC in the AM   DVT prophylaxis: Enoxaparin 40 mg sq q24h Code Status: FULL CODE Family Communication: No family present at bedside currentlyu Disposition Plan: SNF but has  no insurance  Status is: Inpatient  Remains inpatient appropriate because:Unsafe d/c plan and Inpatient level of care appropriate  due to severity of illness   Dispo: The patient is from: Home              Anticipated d/c is to: SNF              Anticipated d/c date is: 1 day              Patient currently is medically stable to d/c.  Consultants:   Neurology  Interventional Radiology   Procedures: PEG placement   Antimicrobials:  Anti-infectives (From admission, onward)   Start     Dose/Rate Route Frequency Ordered Stop   04/19/20 0000  ceFAZolin (ANCEF) IVPB 2g/100 mL premix        2 g 200 mL/hr over 30 Minutes Intravenous To Radiology 04/16/20 1438 04/19/20 1333   04/02/20 2030  cefTRIAXone (ROCEPHIN) 1 g in sodium chloride 0.9 % 100 mL IVPB        1 g 200 mL/hr over 30 Minutes Intravenous Every 24 hours 04/02/20 2020 04/06/20 2159     Subjective: Seen and examined and she remains somewhat confused.  Denies any nausea or vomiting and is resting.  Wearing safety mittens.  No lightheadedness or dizziness.  No other concerns or complaints at this time but will write a very limited subjective history given her current condition.   Objective: Vitals:   04/21/20 2341 04/22/20 0327 04/22/20 0352 04/22/20 0755  BP: 122/81 125/84  129/77  Pulse: 89 86  77  Resp: 16 18  18   Temp: 98.1 F (36.7 C) 97.8 F (36.6 C)  97.6 F (36.4 C)  TempSrc: Oral Oral  Oral  SpO2: 98% 98%  100%  Weight:   58.9 kg   Height:        Intake/Output Summary (Last 24 hours) at 04/22/2020 0804 Last data filed at 04/22/2020 0310 Gross per 24 hour  Intake 2062 ml  Output 400 ml  Net 1662 ml   Filed Weights   04/20/20 0342 04/21/20 0339 04/22/20 0352  Weight: 56.7 kg 57.6 kg 58.9 kg   Examination: Physical Exam:  Constitutional: The patient is a thin African-American female currently in no acute distress appears calm and resting in the bed but remains confused. Eyes: Lids and conjunctivae normal, sclerae anicteric  ENMT: External Ears, Nose appear normal. Grossly normal hearing.  Neck: Appears normal, supple, no  cervical masses, normal ROM, no appreciable thyromegaly; no JVD Respiratory: Diminished to auscultation bilaterally, no wheezing, rales, rhonchi or crackles. Normal respiratory effort and patient is not tachypenic. No accessory muscle use.  Unlabored breathing Cardiovascular: RRR, no murmurs / rubs / gallops. S1 and S2 auscultated. No extremity edema. 2+ pedal pulses. No carotid bruits.  Abdomen: Soft, non-tender, non-distended. Bowel sounds positive.  PEG in place with a abdominal binder GU: Deferred. Musculoskeletal: No clubbing / cyanosis of digits/nails. No joint deformity upper and lower extremities. Skin: No rashes, lesions, ulcers on limited skin evaluation. No induration; Warm and dry.  Neurologic: Remains confused. Moves extremities independently Psychiatric: Impaired judgment and insight.  She is awake but she is not alert and oriented x 3. Normal mood and appropriate affect.   Data Reviewed: I have personally reviewed following labs and imaging studies  CBC: Recent Labs  Lab 04/17/20 0300 04/21/20 0142 04/22/20 0407  WBC 10.1 8.2 7.0  NEUTROABS  --   --  4.1  HGB 9.1* 8.8* 9.0*  HCT 28.4* 27.2* 27.4*  MCV 90.2 91.9 91.0  PLT 294 289 302   Basic Metabolic Panel: Recent Labs  Lab 04/17/20 0300 04/21/20 0142 04/21/20 0755 04/22/20 0407  NA 138 137  --  140  K 4.1 3.3*  --  4.2  CL 103 100  --  104  CO2 24 28  --  28  GLUCOSE 140* 143*  --  164*  BUN 16 14  --  12  CREATININE 0.91 0.93  --  0.84  CALCIUM 9.6 9.6  --  9.6  MG  --   --  1.3* 2.4  PHOS  --   --  4.4 3.9   GFR: Estimated Creatinine Clearance: 68.7 mL/min (by C-G formula based on SCr of 0.84 mg/dL). Liver Function Tests: Recent Labs  Lab 04/22/20 0407  AST 21  ALT 22  ALKPHOS 67  BILITOT 0.4  PROT 6.1*  ALBUMIN 2.7*   No results for input(s): LIPASE, AMYLASE in the last 168 hours. No results for input(s): AMMONIA in the last 168 hours. Coagulation Profile: No results for input(s): INR,  PROTIME in the last 168 hours. Cardiac Enzymes: No results for input(s): CKTOTAL, CKMB, CKMBINDEX, TROPONINI in the last 168 hours. BNP (last 3 results) No results for input(s): PROBNP in the last 8760 hours. HbA1C: No results for input(s): HGBA1C in the last 72 hours. CBG: Recent Labs  Lab 04/21/20 1542 04/21/20 1943 04/21/20 2339 04/22/20 0324 04/22/20 0753  GLUCAP 130* 119* 144* 163* 123*   Lipid Profile: No results for input(s): CHOL, HDL, LDLCALC, TRIG, CHOLHDL, LDLDIRECT in the last 72 hours. Thyroid Function Tests: No results for input(s): TSH, T4TOTAL, FREET4, T3FREE, THYROIDAB in the last 72 hours. Anemia Panel: Recent Labs    04/22/20 0407  VITAMINB12 449  FOLATE 16.7  FERRITIN 195  TIBC 218*  IRON 32  RETICCTPCT 3.2*   Sepsis Labs: No results for input(s): PROCALCITON, LATICACIDVEN in the last 168 hours.  No results found for this or any previous visit (from the past 240 hour(s)).   RN Pressure Injury Documentation:     Estimated body mass index is 20.34 kg/m as calculated from the following:   Height as of this encounter: 5\' 7"  (1.702 m).   Weight as of this encounter: 58.9 kg.  Malnutrition Type:  Nutrition Problem: Severe Malnutrition Etiology: acute illness   Malnutrition Characteristics:  Signs/Symptoms: energy intake < or equal to 50% for > or equal to 5 days, moderate fat depletion, moderate muscle depletion, percent weight loss Percent weight loss: 21.5 %   Nutrition Interventions:  Interventions: Boost Breeze, MVI   Radiology Studies: No results found.   Scheduled Meds: . enoxaparin (LOVENOX) injection  40 mg Subcutaneous Q24H  . feeding supplement (PROSource TF)  45 mL Per Tube BID  . folic acid  1 mg Per Tube Daily  . free water  200 mL Per Tube Q8H  . insulin aspart  0-9 Units Subcutaneous TID WC  . levETIRAcetam  500 mg Per Tube BID  . mometasone-formoterol  2 puff Inhalation BID  . multivitamin with minerals  1 tablet  Per Tube Daily  . thiamine  100 mg Per Tube Daily   Continuous Infusions: . feeding supplement (OSMOLITE 1.5 CAL) 1,000 mL (04/21/20 2001)    LOS: 20 days   04/23/20, DO [facility] PAGER is on AMION  If 7PM-7AM, please contact night-coverage [email or web address]

## 2020-04-22 NOTE — Progress Notes (Addendum)
Physical Therapy Treatment Patient Details Name: Tamara [name] MRN: [id number] DOB: [birth year] Today's Date: 04/22/2020    History of Present Illness 59 y.o. female with medical history significant for dm, htn, ckd, asthma, recent hospitalization (07/21) for unresponsiveness/AMS and diagnosed w/ seizure disorder and started on anticonvulsant. Presented to ED 04/02/20 with AMS after several weeks of poor oral intake. Found to have AKI and metabolic encephalopathy. Negative extended EEG, toxicology, CSF. MRI brain- Hyperintensity in the posteromedial thalami bilaterally (possibly infarction vs Wernicke's encephalopathy); low thiamine level    PT Comments    Pt received in bed, pleasant and interactive with therapist. She required min assist bed mobility, min assist transfers, and min assist ambulation 25' with RW. Her affect waned to flat with fatigue. Unsteady gait requiring assist to maintain balance and manage RW. Pt in recliner with feet elevated at end of session, bilat safety mitts in place. PT goals reviewed and updated as needed.    Follow Up Recommendations  SNF;Supervision/Assistance - 24 hour     Equipment Recommendations  Rolling walker with 5" wheels;3in1 (PT)    Recommendations for Other Services       Precautions / Restrictions Precautions Precautions: Fall;Other (comment) Precaution Comments: PEG tube Restrictions Other Position/Activity Restrictions: abdominal binder in place    Mobility  Bed Mobility Overal bed mobility: Needs Assistance Bed Mobility: Supine to Sit;Rolling Rolling: Min assist   Supine to sit: Min assist;HOB elevated     General bed mobility comments: +rail, cues for initiation and sequencing, assist to elevate trunk  Transfers Overall transfer level: Needs assistance Equipment used: Rolling walker (2 wheeled) Transfers: Sit to/from Stand Sit to Stand: Min assist         General transfer comment: cues for initiation, hand placement,  and sequencing, assist to power up  Ambulation/Gait Ambulation/Gait assistance: Min assist Gait Distance (Feet): 25 Feet Assistive device: Rolling walker (2 wheeled)   Gait velocity: Decreased   General Gait Details: assist to maintain balance and manage RW   Stairs             Wheelchair Mobility    Modified Rankin (Stroke Patients Only)       Balance Overall balance assessment: Needs assistance Sitting-balance support: No upper extremity supported;Feet supported Sitting balance-Leahy Scale: Fair     Standing balance support: Bilateral upper extremity supported;During functional activity Standing balance-Leahy Scale: Poor Standing balance comment: reliant on UE support                            Cognition Arousal/Alertness: Awake/alert Behavior During Therapy: WFL for tasks assessed/performed;Flat affect Overall Cognitive Status: No family/caregiver present to determine baseline cognitive functioning Area of Impairment: Orientation;Following commands;Attention;Safety/judgement;Problem solving;Awareness;Memory                 Orientation Level: Disoriented to;Place;Time;Situation Current Attention Level: Sustained Memory: Decreased short-term memory;Decreased recall of precautions Following Commands: Follows one step commands with increased time;Follows one step commands inconsistently Safety/Judgement: Decreased awareness of deficits;Decreased awareness of safety Awareness: Intellectual Problem Solving: Slow processing;Decreased initiation;Difficulty sequencing;Requires verbal cues;Requires tactile cues General Comments: Smiling and pleasant. Affect wanes to flat with fatigue.      Exercises      General Comments General comments (skin integrity, edema, etc.): Interactiive initially but waned with fatigue.      Pertinent Vitals/Pain Pain Assessment: Faces Faces Pain Scale: No hurt    Home Living  Prior  Function            PT Goals (current goals can now be found in the care plan section) Acute Rehab PT Goals Patient Stated Goal: patient unable to participate in goal setting PT Goal Formulation: With patient Time For Goal Achievement: 05/05/20 Potential to Achieve Goals: Fair Progress towards PT goals: Progressing toward goals    Frequency    Min 2X/week      PT Plan Current plan remains appropriate    Co-evaluation              AM-PAC PT "6 Clicks" Mobility   Outcome Measure  Help needed turning from your back to your side while in a flat bed without using bedrails?: A Little Help needed moving from lying on your back to sitting on the side of a flat bed without using bedrails?: A Lot Help needed moving to and from a bed to a chair (including a wheelchair)?: A Little Help needed standing up from a chair using your arms (e.g., wheelchair or bedside chair)?: A Little Help needed to walk in hospital room?: A Little Help needed climbing 3-5 steps with a railing? : A Lot 6 Click Score: 16    End of Session Equipment Utilized During Treatment: Gait belt Activity Tolerance: Patient tolerated treatment well Patient left: in chair;with call bell/phone within reach;with chair alarm set;with restraints reapplied Nurse Communication: Mobility status PT Visit Diagnosis: Unsteadiness on feet (R26.81);Other abnormalities of gait and mobility (R26.89);Other symptoms and signs involving the nervous system (Z61.096)     Time: 0454-0981 PT Time Calculation (min) (ACUTE ONLY): 17 min  Charges:  $Gait Training: 8-22 mins                     Aida Raider, PT  Office # [phone] Pager [phone]    Ilda Foil 04/22/2020, 8:51 AM

## 2020-04-23 DIAGNOSIS — R1310 Dysphagia, unspecified: Secondary | ICD-10-CM

## 2020-04-23 LAB — GLUCOSE, CAPILLARY
Glucose-Capillary: 113 mg/dL — ABNORMAL HIGH (ref 70–99)
Glucose-Capillary: 131 mg/dL — ABNORMAL HIGH (ref 70–99)
Glucose-Capillary: 144 mg/dL — ABNORMAL HIGH (ref 70–99)
Glucose-Capillary: 145 mg/dL — ABNORMAL HIGH (ref 70–99)

## 2020-04-23 MED ORDER — THIAMINE HCL 100 MG PO TABS: 100.0000 mg | ORAL_TABLET | Freq: Every day | ORAL | 0 refills | Status: AC

## 2020-04-23 MED ORDER — DEXLANSOPRAZOLE 30 MG PO CPDR: 30.0000 mg | DELAYED_RELEASE_CAPSULE | Freq: Every day | ORAL | Status: DC

## 2020-04-23 MED ORDER — MIRALAX 17 GM/SCOOP PO POWD: 17.0000 g | Freq: Every day | ORAL | 0 refills | Status: DC

## 2020-04-23 MED ORDER — PROSOURCE TF PO LIQD: 45.0000 mL | Freq: Two times a day (BID) | ORAL | 0 refills | Status: DC

## 2020-04-23 MED ORDER — METFORMIN HCL 1000 MG PO TABS: 1000.0000 mg | ORAL_TABLET | Freq: Two times a day (BID) | ORAL | Status: AC

## 2020-04-23 MED ORDER — FOLIC ACID 1 MG PO TABS: 1.0000 mg | ORAL_TABLET | Freq: Every day | ORAL | Status: AC

## 2020-04-23 MED ORDER — MAGNESIUM 30 MG PO TABS: 30.0000 mg | ORAL_TABLET | Freq: Every day | ORAL | 0 refills | Status: AC

## 2020-04-23 MED ORDER — LEVETIRACETAM 100 MG/ML PO SOLN: 500.0000 mg | Freq: Two times a day (BID) | ORAL | 12 refills | Status: AC

## 2020-04-23 MED ORDER — ASPIRIN 81 MG PO CHEW: 81.0000 mg | CHEWABLE_TABLET | Freq: Every day | ORAL | Status: AC

## 2020-04-23 MED ORDER — OSMOLITE 1.5 CAL PO LIQD: 1000.0000 mL | ORAL | 0 refills | Status: DC

## 2020-04-23 MED ORDER — MONTELUKAST SODIUM 10 MG PO TABS: 10.0000 mg | ORAL_TABLET | Freq: Every day | ORAL | 2 refills | Status: AC

## 2020-04-23 MED ORDER — LORATADINE 10 MG PO TABS: 10.0000 mg | ORAL_TABLET | Freq: Every day | ORAL | 0 refills | Status: DC

## 2020-04-23 MED ORDER — LOSARTAN POTASSIUM 100 MG PO TABS: 100.0000 mg | ORAL_TABLET | Freq: Every day | ORAL | Status: AC

## 2020-04-23 MED ORDER — ACETAMINOPHEN 500 MG PO CAPS: 1000.0000 mg | ORAL_CAPSULE | Freq: Three times a day (TID) | ORAL | 0 refills | Status: AC | PRN

## 2020-04-23 MED ORDER — ATORVASTATIN CALCIUM 80 MG PO TABS
40.0000 mg | ORAL_TABLET | Freq: Every day | ORAL | Status: DC
Start: 2020-04-23 — End: 2022-08-16

## 2020-04-23 MED ORDER — ADULT MULTIVITAMIN W/MINERALS CH: 1.0000 | ORAL_TABLET | Freq: Every day | ORAL | 0 refills | Status: AC

## 2020-04-23 MED ORDER — FREE WATER: 200.0000 mL | Freq: Three times a day (TID) | Status: AC

## 2020-04-23 NOTE — Progress Notes (Signed)
Attempted to call [facility] SNF x2. No response.  All belongings with patient. IV has been removed. Pt leaving unit via PTAR.

## 2020-04-23 NOTE — TOC Transition Note (Signed)
Transition of Care [facility]) - CM/SW Discharge Note   Patient Details  Name: Arionna Hoggard MRN: [id number] Date of Birth: 03-Feb-1961  [facility]) CM/SW Contact:  Baldemar Lenis, LCSW Phone Number: 04/23/2020, 1:05 PM   Clinical Narrative:   Nurse to call report to [phone], Room 105    Final next level of care: Skilled Nursing Facility Barriers to Discharge: Barriers Resolved   Patient Goals and CMS Choice Patient states their goals for this hospitalization and ongoing recovery are:: patient unable to participate in goal setting due to disorientation CMS Medicare.gov Compare Post Acute Care list provided to:: Patient Represenative (must comment) Choice offered to / list presented to : Adult Children  Discharge Placement              Patient chooses bed at:  [facility] Patient to be transferred to facility by: PT[facility] Name of family member notified: Shamar Patient and family notified of of transfer: 04/23/20  Discharge Plan and Services     Post Acute Care Choice: Skilled Nursing Facility                               Social Determinants of Health (SDOH) Interventions     Readmission Risk Interventions No flowsheet data found.

## 2020-04-23 NOTE — Progress Notes (Signed)
Occupational Therapy Treatment Patient Details Name: Tamara [name] MRN: [id number] DOB: 08/28/1961 Today's Date: 04/23/2020    History of present illness 59 y.o. female with medical history significant for dm, htn, ckd, asthma, recent hospitalization (07/21) for unresponsiveness/AMS and diagnosed w/ seizure disorder and started on anticonvulsant. Presented to ED 04/02/20 with AMS after several weeks of poor oral intake. Found to have AKI and metabolic encephalopathy. Negative extended EEG, toxicology, CSF. MRI brain- Hyperintensity in the posteromedial thalami bilaterally (possibly infarction vs Wernicke's encephalopathy); low thiamine level   OT comments  Pt. Seen for skilled OT treatment.  Pt. Able to complete bed mobility, LB dressing, and grooming tasks with min/mod a and gestural and demonstrational cues for sequencing.    Follow Up Recommendations  SNF;Supervision/Assistance - 24 hour    Equipment Recommendations       Recommendations for Other Services      Precautions / Restrictions Precautions Precautions: Fall;Other (comment) Precaution Comments: PEG tube Restrictions Other Position/Activity Restrictions: abdominal binder in place       Mobility Bed Mobility Overal bed mobility: Needs Assistance Bed Mobility: Supine to Sit;Rolling Rolling: Min assist   Supine to sit: Min assist;HOB elevated     General bed mobility comments: +rail, cues for initiation and sequencing, assist to elevate trunk  Transfers                 General transfer comment: able to sit eob and scoot towards hob x2 with gestural cues prior to lying back down    Balance                                           ADL either performed or assessed with clinical judgement   ADL       Grooming: Sitting;Moderate assistance;Cueing for sequencing Grooming Details (indicate cue type and reason): applied lotion to pts. hands and provided gestural cues initially and then hand  over hand assistance for fully rubbing it in             Lower Body Dressing: Set up;Cueing for sequencing;Sitting/lateral leans Lower Body Dressing Details (indicate cue type and reason): seated eob pt. able to don B socks with gestural/demonstration cues. crossed each leg over knee. once cues provided for 1st sock was able to don the 2nd sock without cues                     Vision       Perception     Praxis      Cognition                                                Exercises     Shoulder Instructions       General Comments      Pertinent Vitals/ Pain       Pain Assessment: Faces Faces Pain Scale: No hurt  Home Living                                          Prior Functioning/Environment              Frequency  Min  2X/week        Progress Toward Goals  OT Goals(current goals can now be found in the care plan section)  Progress towards OT goals: Progressing toward goals     Plan Discharge plan remains appropriate    Co-evaluation                 AM-PAC OT "6 Clicks" Daily Activity     Outcome Measure   Help from another person eating meals?: A Lot Help from another person taking care of personal grooming?: A Lot Help from another person toileting, which includes using toliet, bedpan, or urinal?: A Lot Help from another person bathing (including washing, rinsing, drying)?: A Lot Help from another person to put on and taking off regular upper body clothing?: A Lot Help from another person to put on and taking off regular lower body clothing?: A Lot 6 Click Score: 12    End of Session    OT Visit Diagnosis: Other abnormalities of gait and mobility (R26.89);Muscle weakness (generalized) (M62.81);Other symptoms and signs involving cognitive function;Cognitive communication deficit (R41.841)   Activity Tolerance Patient tolerated treatment well   Patient Left in bed;with call bell/phone  within reach;with bed alarm set;with restraints reapplied   Nurse Communication          Time: 8372-9021 OT Time Calculation (min): 9 min  Charges: OT General Charges $OT Visit: 1 Visit OT Treatments $Self Care/Home Management : 8-22 mins  Boneta Lucks, COTA/L Acute Rehabilitation [phone]   Robet Leu 04/23/2020, 12:20 PM

## 2020-04-23 NOTE — Discharge Summary (Signed)
Physician Discharge Summary  Tamara [name] ZOX:[id number] DOB: Jun 16, 1961 DOA: 04/02/2020  PCP: [name], NP  Admit date: 04/02/2020 Discharge date: 04/23/2020  Admitted From: Home Disposition: SNF  Recommendations for Outpatient Follow-up:  1. Follow up with PCP in 1-2 weeks 2. Follow up with Neurology within 1-2 weeks 3. Have SLP re-evaluate at SNF for safe swalling  4. Please obtain CMP/CBC, Mag, Phos in one week 5. Please follow up on the following pending results:  Home Health: No Equipment/Devices: Agricultural consultant with 5" Wheels; 3in1  Discharge Condition: Stable CODE STATUS: FULL CODE  Diet recommendation: NPO; Getting TF  Brief/Interim Summary: The patient is a 59 year old African-American female with a past medical history significant for but not limited to diabetes mellitus type 2, hypertension, chronic kidney disease, asthma, seizure disorder who came in with confusion, worsening mental status and noncompliant with her medications.  Symptoms progressed over the last several weeks and neurology was consulted and she had an MRI of the brain was suggested possible subacute bilateral thalamic infarcts however her presentation was most consistent with Wernicke's encephalopathy.  She was started on thiamine and will be continued on Keppra.  Because of her encephalopathy she had dysphagia and had to have a PEG tube placement is currently tolerating tube feedings well.  Currently the patient has been deemed medically stable to be discharged and will need to follow up with PCP and with Neurology in the outpatient setting.  Discharge Diagnoses:  Principal Problem:   Acute metabolic encephalopathy Active Problems:   Asthma with acute exacerbation   Diabetes (HCC)   Acute encephalopathy   AKI (acute kidney injury) (HCC)   Protein-calorie malnutrition, severe  Wernicke's Encephalopathy Thiamine Deficiency -Patient treated with high dose thiamine. MRI suggests possible subacute  bilateral thalamic infarcts however, more likely Wernicke's encephalopathy -Continue Thiamine at 100 mg per Tube Daily, Folic Acid 1 mg per Tube, and MVI -C/w Keppra 500 mg per Tube BID  AKI on CKD stage IIIa -Resolved. -Patient's BUN and creatinine is improved and is now 12/0.84 yesterday  -Avoid nephrotoxic medications, contrast dyes, hypotension and renally dose medications -Repeat CMP at SNF  Hypernatremia -Resolved. -Continue with free water flushes per dietary recommendations -patient sodium is now 140 -Continue to Monitor and trend and repeat CMP at SNF  Non-Anion Gap Metabolic Acidosis -Resolved. -Yesterday CO2 was 28, chloride level level was 104, and anion gap was 8 -Continue monitor and trend repeat BMP in a.m.  Severe Protien Calorie malnutrition in the context of Acute illness -Patient had an NG tube which she pulled and has PEG Placed now - PEG tube to restart tube feeds -Currently now on Osmolite 1.5 at 50 mL/hr with 45 mL Prosource TF BID and 200 mL free Water Flushes q8h -Further care per Nutrition   Hypomagnesemia -Patient's magnesium level was 1.3 and is now 2.4 yesterday  -Replete with IV mag sulfate 4 g the day before yesterday  -Continue to monitor and replete as necessary -Repeat magnesium level at SNF  Hypokalemia -Patient's potassium was 3.3 and is now 4.2 on yesterdays check -Continue to monitor and replete as necessary -Repeat CMP at SNF  Essential Hypertension -Patient is on Losartan as an outpatient and will be resumed via PEG at D/C. BP controlled here -C/w Labetolol 10 mg IV q2hprn SBP>180 or DBP>100 -Last BP was 135/76  Diabetes Mellitus, Type 2 -Controlled. Was taking Lantus 20 units BID and Metformin 1000 mg po BID at home. Will resume Metformin for Discharge but hold the  Lantus  -Hemoglobin A1C of 6.2% -CBG's ranging from 111-145 and blood glucose was 164 on CMP yesterday -C/w Sensitive Novolog SSI TID while hospitalized    Acute Urinary Retention -Foley placed on 8/10. No voiding trial performed. -Foley now removed -Continue to Monitor Output  Normocytic Anemia -Patient's Hgb/Hct yesterday was 8.8/27.2 and relatively stable and today is 9.0/27.4 -Checked Anemia Panel and showed an iron level of 32, U IBC 186, TIBC of 218, saturation ratios of 15%, ferritin level 195, folate level 16.7, and vitamin B12 level of 449 -Continue to Monitor for S/Sx of Bleeding; Currently no overt bleeding noted -Repeat CBC at SNF  GERD -Hold Omeprazole and start Dexlansoprazole 40 mg via PEG  Dyslipidemia -Resume Atorvastatin 40 mg Daily via PEG  Asthma -Currently not in Exacerbation -Resume Home Inhalers with Albuterol PRN, Advair, Loratadine and Montelukast  Discharge Instructions   Allergies as of 04/23/2020      Reactions   Chlorhexidine       Medication List    STOP taking these medications   aspirin EC 81 MG tablet Replaced by: aspirin 81 MG chewable tablet   gabapentin 300 MG capsule Commonly known as: NEURONTIN   Lantus SoloStar 100 UNIT/ML Solostar Pen Generic drug: insulin glargine   levETIRAcetam 500 MG tablet Commonly known as: Keppra Replaced by: levETIRAcetam 100 MG/ML solution   omeprazole 20 MG capsule Commonly known as: PRILOSEC     TAKE these medications   Acetaminophen 500 MG capsule Place 2 capsules (1,000 mg total) into feeding tube 3 (three) times daily as needed for pain. What changed: how to take this   albuterol 108 (90 Base) MCG/ACT inhaler Commonly known as: VENTOLIN HFA Inhale 2 puffs into the lungs every 6 (six) hours as needed for wheezing.   aspirin 81 MG chewable tablet Commonly known as: Aspirin Childrens Place 1 tablet (81 mg total) into feeding tube daily. Replaces: aspirin EC 81 MG tablet   atorvastatin 80 MG tablet Commonly known as: LIPITOR Place 0.5 tablets (40 mg total) into feeding tube at bedtime. What changed: how to take this   buPROPion 150  MG 12 hr tablet Commonly known as: WELLBUTRIN SR Take 150 mg by mouth daily.   Dexlansoprazole 30 MG capsule Place 1 capsule (30 mg total) into feeding tube daily.   feeding supplement (OSMOLITE 1.5 CAL) Liqd Place 1,000 mLs into feeding tube continuous.   feeding supplement (PROSource TF) liquid Place 45 mLs into feeding tube 2 (two) times daily.   Fluticasone-Salmeterol 250-50 MCG/DOSE Aepb Commonly known as: ADVAIR Inhale 1 puff into the lungs 2 (two) times daily.   folic acid 1 MG tablet Commonly known as: FOLVITE Place 1 tablet (1 mg total) into feeding tube daily. What changed: how to take this   free water Soln Place 200 mLs into feeding tube every 8 (eight) hours.   levETIRAcetam 100 MG/ML solution Commonly known as: KEPPRA Place 5 mLs (500 mg total) into feeding tube 2 (two) times daily. Replaces: levETIRAcetam 500 MG tablet   loratadine 10 MG tablet Commonly known as: CLARITIN Place 1 tablet (10 mg total) into feeding tube daily. What changed: how to take this   losartan 100 MG tablet Commonly known as: COZAAR Place 1 tablet (100 mg total) into feeding tube daily. What changed: how to take this   magnesium 30 MG tablet Place 1 tablet (30 mg total) into feeding tube daily. What changed: how to take this   metFORMIN 1000 MG tablet Commonly known as: GLUCOPHAGE  Place 1 tablet (1,000 mg total) into feeding tube 2 (two) times daily with a meal. What changed: how to take this   MiraLax 17 GM/SCOOP powder Generic drug: polyethylene glycol powder Place 17 g into feeding tube at bedtime. What changed: how to take this   montelukast 10 MG tablet Commonly known as: SINGULAIR Place 1 tablet (10 mg total) into feeding tube at bedtime. What changed: how to take this   multivitamin with minerals Tabs tablet Place 1 tablet into feeding tube daily.   thiamine 100 MG tablet Place 1 tablet (100 mg total) into feeding tube daily.       Allergies  Allergen  Reactions  . Chlorhexidine    Consultations:  Neurology  Procedures/Studies: CT ABDOMEN PELVIS WO CONTRAST  Result Date: 04/02/2020 CLINICAL DATA:  Acute abdominal pain.  Decreased responsiveness. EXAM: CT ABDOMEN AND PELVIS WITHOUT CONTRAST TECHNIQUE: Multidetector CT imaging of the abdomen and pelvis was performed following the standard protocol without IV contrast. COMPARISON:  03/16/2020.  03/01/2020 FINDINGS: Lower chest: The lung bases are clear. Hepatobiliary: The gallbladder is moderately distended. No stones or wall thickening. No bile duct dilatation. Unenhanced appearance of the liver is unremarkable. Pancreas: Unremarkable. No pancreatic ductal dilatation or surrounding inflammatory changes. Spleen: Normal in size without focal abnormality. Adrenals/Urinary Tract: Adrenal glands are unremarkable. Kidneys are normal, without renal calculi, focal lesion, or hydronephrosis. Bladder is unremarkable. Stomach/Bowel: Stomach, small bowel, and colon are not abnormally distended. No wall thickening or inflammatory changes are demonstrated. There is persistent finding of residual contrast material demonstrated throughout the colon and rectum. Streak artifact arising from contrast material limits examination. The appendix is normal. Vascular/Lymphatic: Aortic atherosclerosis. No enlarged abdominal or pelvic lymph nodes. Reproductive: Uterus is normal in size. Scattered uterine calcifications consistent with fibroids. No abnormal adnexal masses. Other: No abdominal wall hernia or abnormality. No abdominopelvic ascites. Musculoskeletal: No acute or significant osseous findings. IMPRESSION: 1. No acute process demonstrated in the abdomen or pelvis on noncontrast imaging. No evidence of bowel obstruction or inflammation. Residual contrast material demonstrated throughout the colon. 2. Aortic atherosclerosis. 3. Uterine fibroids. Aortic Atherosclerosis (ICD10-I70.0). Electronically Signed   By: Burman Nieves  M.D.   On: 04/02/2020 19:13   CT Head Wo Contrast  Result Date: 04/02/2020 CLINICAL DATA:  Decreased responsiveness. Mental status change of unknown cause. EXAM: CT HEAD WITHOUT CONTRAST TECHNIQUE: Contiguous axial images were obtained from the base of the skull through the vertex without intravenous contrast. COMPARISON:  CT 02/29/2020.  MRI 03/01/2020 FINDINGS: Brain: Diffuse cerebral atrophy. Ventricular dilatation consistent with central atrophy. Low-attenuation changes in the deep white matter consistent with small vessel ischemia. No abnormal extra-axial fluid collections. No mass effect or midline shift. Gray-white matter junctions are distinct. Basal cisterns are not effaced. No acute intracranial hemorrhage. Vascular: Intracranial arterial calcifications. Skull: Calvarium appears intact. Sinuses/Orbits: Paranasal sinuses and mastoid air cells are clear. Other: None. IMPRESSION: 1. No acute intracranial abnormalities. 2. Chronic atrophy and small vessel ischemia. Electronically Signed   By: Burman Nieves M.D.   On: 04/02/2020 19:16   MR ANGIO HEAD WO CONTRAST  Result Date: 04/07/2020 CLINICAL DATA:  59 year old female with abnormal signal in the bilateral thalami on recent brain MRI. EXAM: MRA HEAD WITHOUT CONTRAST TECHNIQUE: Angiographic images of the Circle of Willis were obtained using MRA technique without intravenous contrast. COMPARISON:  Brain MRI 04/06/2020 and earlier. Neck MRA today reported separately. FINDINGS: Antegrade flow in the posterior circulation with dominant left vertebral artery which appears to largely  supply the basilar. Minimal flow signal in a diminutive right V4 segment. Patent basilar artery with tortuosity but no stenosis. Patent SCA origins. Fetal type left PCA origin, with small right posterior communicating artery also. Bilateral PCA branches are patent and within normal limits, with tortuosity greater on the right. Antegrade flow in both ICA siphons. Tortuous  cervical ICAs just below the skull base. No siphon stenosis. Ophthalmic and posterior communicating artery origins appear normal. Patent carotid termini, MCA and ACA origins. Anterior communicating artery and visible ACA branches are within normal limits. MCA M1 segments are tortuous without stenosis. Both MCA bifurcations are patent. Visible bilateral MCA branches are within normal limits. IMPRESSION: 1. Intracranial artery tortuosity with no stenosis or vessel occlusion identified. 2. Normal variations: Dominant left vertebral artery, the right is diminutive. Fetal type left PCA origin. Electronically Signed   By: Odessa Fleming M.D.   On: 04/07/2020 22:20   MR ANGIO NECK W WO CONTRAST  Result Date: 04/07/2020 CLINICAL DATA:  59 year old female with abnormal signal in the bilateral thalami on recent brain MRI. EXAM: MRA NECK WITHOUT AND WITH CONTRAST TECHNIQUE: Multiplanar and multiecho pulse sequences of the neck were obtained without and with intravenous contrast. Angiographic images of the neck were obtained using MRA technique without and with intravenous contrast. CONTRAST:  18mL GADAVIST GADOBUTROL 1 MMOL/ML IV SOLN was injected, however, the patient became agitated and incontinent of urine and stool at the same time. The IV access was lost at that time, and it was unclear how much contrast the patient received. COMPARISON:  Intracranial MRA today reported separately. Brain MRI 04/06/2020 and earlier. Cervical spine CT 02/29/2020.  Brain MRI 09/15/2006. FINDINGS: Precontrast time-of-flight images are mildly degraded by motion, demonstrating antegrade flow in both cervical carotid arteries and the left vertebral artery. Little to no flow signal in the right vertebral artery, which has appeared diminutive and non dominant at the skull base since 2008. However, the cervical Spine CT last month demonstrates a more symmetric appearance of the bony cervical transverse foramen. Both carotid bifurcations appear patent,  without strong evidence of stenosis. Post-contrast MRA images were attempted but are motion degraded, and seem to indicate little contrast administration (see above). Due to patient agitation, no repeat post-contrast MRA images were attempted. IMPRESSION: 1. Noncontrast MRA images of the neck demonstrate patent bilateral carotid and left vertebral arteries. 2. No significant flow signal in the cervical right vertebral artery which has appeared diminutive - non-dominant at the skull base since 2008. The right vertebral could be congenitally non dominant or chronically occluded in the neck. 3. No diagnostic post-contrast MRA images could be obtained. See details above. Electronically Signed   By: Odessa Fleming M.D.   On: 04/07/2020 22:25   MR BRAIN WO CONTRAST  Result Date: 04/03/2020 CLINICAL DATA:  Delirium EXAM: MRI HEAD WITHOUT CONTRAST TECHNIQUE: Multiplanar, multiecho pulse sequences of the brain and surrounding structures were obtained without intravenous contrast. COMPARISON:  CT head 04/02/2020.  MRI head 03/01/2020 FINDINGS: Brain: New areas of diffusion signal in the posteromedial thalamus bilaterally. This is symmetric. No restricted diffusion but rather facilitated diffusion in these areas. No other abnormality on diffusion-weighted imaging. Normal midbrain. Pituitary normal in size. Generalized atrophy most prominent in the temporal lobes especially on the left. Hippocampal atrophy bilaterally. Negative for hemorrhage or mass. Vascular: Normal arterial flow voids. Skull and upper cervical spine: Negative Sinuses/Orbits: Negative Other: None IMPRESSION: 1. Interval development of diffusion hyperintensity in the posteromedial thalamus bilaterally without restricted diffusion. This  could be subacute infarction particularly with artery of Percheron anatomy. Wernicke's encephalopathy also could give this appearance however recent B12 levels are normal. 2. Generalized atrophy most prominent in the left temporal  lobe. Electronically Signed   By: Marlan Palau M.D.   On: 04/03/2020 18:00   MR BRAIN W WO CONTRAST  Result Date: 04/06/2020 CLINICAL DATA:  Brain mass or lesion. EXAM: MRI HEAD WITHOUT AND WITH CONTRAST TECHNIQUE: Multiplanar, multiecho pulse sequences of the brain and surrounding structures were obtained without and with intravenous contrast. CONTRAST:  34mL GADAVIST GADOBUTROL 1 MMOL/ML IV SOLN COMPARISON:  04/03/2020 MRI head and prior.  04/02/2020 head CT. FINDINGS: Image quality is degraded by motion artifact. Brain: T2 hyperintense signal and restricted diffusion involving the posteromedial thalami is unchanged. There is no definite correlative enhancement. No new diffusion-weighted signal abnormality. No intracranial hemorrhage. No midline shift, ventriculomegaly or extra-axial fluid collection. No mass lesion. Parenchymal volume loss most prominent in the bilateral temporal lobes is unchanged. Vascular: Normal flow voids. Skull and upper cervical spine: Normal marrow signal. Sinuses/Orbits: Normal orbits. Clear paranasal sinuses. No mastoid effusion. Other: None. IMPRESSION: Bilateral thalamic diffusion-weighted signal abnormality is unchanged. No definite abnormal enhancement. Differential includes subacute infarct versus Wernicke's encephalopathy. Cerebral atrophy demonstrating bitemporal predominance, unchanged. Electronically Signed   By: Stana Bunting M.D.   On: 04/06/2020 17:41   IR GASTROSTOMY TUBE MOD SED  Result Date: 04/19/2020 CLINICAL DATA:  Stroke, needs enteral feeding support EXAM: PERC PLACEMENT GASTROSTOMY FLUOROSCOPY TIME:  48 seconds; 2 mGy TECHNIQUE: The procedure, risks, benefits, and alternatives were explained to the daughter. Questions regarding the procedure were encouraged and answered. The daughter understands and consents to the procedure. As antibiotic prophylaxis, cefazolin 2 g was ordered pre-procedure and administered intravenously within one hour of incision.  A safe percutaneous approach was confirmed on recent CT abdomen. A 5 French angiographic catheter was placed as orogastric tube. The upper abdomen was prepped with Betadine, draped in usual sterile fashion, and infiltrated locally with 1% lidocaine. Intravenous Fentanyl and Versed 1.5mg  were administered as conscious sedation during continuous monitoring of the patient's level of consciousness and physiological / cardiorespiratory status by the radiology RN, with a total moderate sedation time of 12 minutes. Stomach was insufflated using air through the orogastric tube. An 24 French sheath needle was advanced percutaneously into the gastric lumen under fluoroscopy. Gas could be aspirated and a small contrast injection confirmed intraluminal spread. The sheath was exchanged over a guidewire for a 9 [place] vascular sheath, through which the snare device was advanced and used to snare a guidewire passed through the orogastric tube. This was withdrawn, and the snare attached to the 20 French pull-through gastrostomy tube, which was advanced antegrade, positioned with the internal bumper securing the anterior gastric wall to the anterior abdominal wall. Small contrast injection confirms appropriate positioning. The external bumper was applied and the catheter was flushed. COMPLICATIONS: COMPLICATIONS none IMPRESSION: 1. Technically successful 20 French pull-through gastrostomy placement under fluoroscopy. Electronically Signed   By: Corlis Leak M.D.   On: 04/19/2020 14:24   DG Chest Portable 1 View  Result Date: 04/02/2020 CLINICAL DATA:  Tachycardia EXAM: PORTABLE CHEST 1 VIEW COMPARISON:  02/15/2020 FINDINGS: The heart size and mediastinal contours are within normal limits. Both lungs are clear. The visualized skeletal structures are unremarkable. Residual contrast material in the visualized colon. IMPRESSION: No active disease. Electronically Signed   By: Burman Nieves M.D.   On: 04/02/2020 20:07   EEG  adult  Result Date: 04/04/2020 Rejeana Brock, MD     04/04/2020  6:08 PM History: 59 yo F being evaluated for AMS. Sedation: None Technique: This is a 21 channel routine scalp EEG performed at the bedside with bipolar and monopolar montages arranged in accordance to the international 10/20 system of electrode placement. One channel was dedicated to EKG recording. Background: The background consists of a posterior dominant rhythm of 7 to 8 Hz with superimposed generalized irregular delta and theta range activities.  Sleep structures are seen and are symmetric in nature. Photic stimulation: Physiologic driving is not performed EEG Abnormalities: 1) generalized irregular slow activity 2) slow posterior dominant rhythm Clinical Interpretation: This EEG is consistent with a generalized nonspecific cerebral dysfunction (encephalopathy).  There was no seizure or seizure predisposition recorded on this study. Please note that lack of epileptiform activity on EEG does not preclude the possibility of epilepsy. Ritta Slot, MD [facility] [phone] If 7pm- 7am, please page neurology on call as listed in AMION.   Overnight EEG with video  Result Date: 04/05/2020 Charlsie Quest, MD     04/05/2020 10:59 AM Patient Name: Tamara [name] MRN: [id number] Epilepsy Attending: Charlsie Quest Referring Physician/Provider: Dr. Onalee Hua Duration: 04/04/2020 1503 2 04/05/2020 1035 Patient history: 59 year old female being evaluated for altered mental status.  EEG to assess for seizures. Level of alertness: Awake, asleep AEDs during EEG study: Keppra Technical aspects: This EEG study was done with scalp electrodes positioned according to the 10-20 International system of electrode placement. Electrical activity was acquired at a sampling rate of  and reviewed with a high frequency filter of  and a low frequency filter of . EEG data were recorded continuously and digitally stored.  Description: The posterior dominant rhythm consists of 8 Hz activity of moderate voltage (25-35 uV) seen predominantly in posterior head regions, symmetric and reactive to eye opening and eye closingSleep was characterized by vertex waves, sleep spindles (12 to 14 Hz), maximal frontocentral region. EEG showed continuous generalized 6-7 Hz theta slowing.  Hyperventilation and photic stimulation were not performed.   ABNORMALITY -Continuous slow, generalized IMPRESSION: This study is suggestive of mild diffuse encephalopathy, nonspecific etiology. No seizures or epileptiform discharges were seen throughout the recording. Priyanka Annabelle Harman    Subjective: Seen and examined at bedside and she was doing okay still but still remains confused.  Had no complaints.  Resting in bed.  No other concerns at this time and updated daughter yesterday.  Discharge Exam: Vitals:   04/23/20 0343 04/23/20 0754  BP: 131/84 135/76  Pulse: 77 81  Resp: 16 16  Temp: 97.8 F (36.6 C) 97.8 F (36.6 C)  SpO2: 100% 98%   Vitals:   04/22/20 1949 04/22/20 2346 04/23/20 0343 04/23/20 0754  BP: 133/80 139/86 131/84 135/76  Pulse: 81 88 77 81  Resp: Temp: 98.3 F (36.8 C) 98 F (36.7 C) 97.8 F (36.6 C) 97.8 F (36.6 C)  TempSrc: Oral Oral Oral Oral  SpO2: 100% 99% 100% 98%  Weight:   58.2 kg   Height:       General: Pt is awake but not alert, not in acute distress Cardiovascular: RRR, S1/S2 +, no rubs, no gallops Respiratory: Diminished bilaterally, no wheezing, no rhonchi Abdominal: Soft, NT, ND, PEG in place with Abdominal Binder. bowel sounds + Extremities: no edema, no cyanosis  The results of significant diagnostics from this hospitalization (including imaging, microbiology, ancillary and laboratory) are listed below for reference.  Microbiology: Recent Results (from the past 240 hour(s))  SARS CORONAVIRUS 2 (TAT 6-24 HRS) Nasopharyngeal Nasopharyngeal Swab     Status: None   Collection  Time: 04/22/20  4:40 PM   Specimen: Nasopharyngeal Swab  Result Value Ref Range Status   SARS Coronavirus 2 NEGATIVE NEGATIVE Final    Comment: (NOTE) SARS-CoV-2 target nucleic acids are NOT DETECTED.  The SARS-CoV-2 RNA is generally detectable in upper and lower respiratory specimens during the acute phase of infection. Negative results do not preclude SARS-CoV-2 infection, do not rule out co-infections with other pathogens, and should not be used as the sole basis for treatment or other patient management decisions. Negative results must be combined with clinical observations, patient history, and epidemiological information. The expected result is Negative.  Fact Sheet for Patients: HairSlick.nohttps://www.fda.gov/media/138098/download  Fact Sheet for Healthcare Providers: quierodirigir.comhttps://www.fda.gov/media/138095/download  This test is not yet approved or cleared by the [place] FDA and  has been authorized for detection and/or diagnosis of SARS-CoV-2 by FDA under an Emergency Use Authorization (EUA). This EUA will remain  in effect (meaning this test can be used) for the duration of the COVID-19 declaration under Se ction 564(b)(1) of the Act, 21 U.S.C. section 360bbb-3(b)(1), unless the authorization is terminated or revoked sooner.  Performed at [facility], [address] [place], [place] [address]     Labs: BNP (last 3 results) No results for input(s): BNP in the last 8760 hours. Basic Metabolic Panel: Recent Labs  Lab 04/17/20 0300 04/21/20 0142 04/21/20 0755 04/22/20 0407  NA 138 137  --  140  K 4.1 3.3*  --  4.2  CL 103 100  --  104  CO2 24 28  --  28  GLUCOSE 140* 143*  --  164*  BUN 16 14  --  12  CREATININE 0.91 0.93  --  0.84  CALCIUM 9.6 9.6  --  9.6  MG  --   --  1.3* 2.4  PHOS  --   --  4.4 3.9   Liver Function Tests: Recent Labs  Lab 04/22/20 0407  AST 21  ALT 22  ALKPHOS 67  BILITOT 0.4  PROT 6.1*  ALBUMIN 2.7*   No results for input(s):  LIPASE, AMYLASE in the last 168 hours. No results for input(s): AMMONIA in the last 168 hours. CBC: Recent Labs  Lab 04/17/20 0300 04/21/20 0142 04/22/20 0407  WBC 10.1 8.2 7.0  NEUTROABS  --   --  4.1  HGB 9.1* 8.8* 9.0*  HCT 28.4* 27.2* 27.4*  MCV 90.2 91.9 91.0  PLT 294 289 302   Cardiac Enzymes: No results for input(s): CKTOTAL, CKMB, CKMBINDEX, TROPONINI in the last 168 hours. BNP: Invalid input(s): POCBNP CBG: Recent Labs  Lab 04/22/20 1605 04/22/20 1946 04/22/20 2345 04/23/20 0342 04/23/20 0752  GLUCAP 131* 116* 144* 145* 131*   D-Dimer No results for input(s): DDIMER in the last 72 hours. Hgb A1c No results for input(s): HGBA1C in the last 72 hours. Lipid Profile No results for input(s): CHOL, HDL, LDLCALC, TRIG, CHOLHDL, LDLDIRECT in the last 72 hours. Thyroid function studies No results for input(s): TSH, T4TOTAL, T3FREE, THYROIDAB in the last 72 hours.  Invalid input(s): FREET3 Anemia work up Recent Labs    04/22/20 0407  VITAMINB12 449  FOLATE 16.7  FERRITIN 195  TIBC 218*  IRON 32  RETICCTPCT 3.2*   Urinalysis    Component Value Date/Time   COLORURINE YELLOW 04/02/2020 1918   APPEARANCEUR HAZY (A)  04/02/2020 1918   LABSPEC 1.010 04/02/2020 1918   PHURINE 6.0 04/02/2020 1918   GLUCOSEU NEGATIVE 04/02/2020 1918   HGBUR MODERATE (A) 04/02/2020 1918   BILIRUBINUR NEGATIVE 04/02/2020 1918   KETONESUR NEGATIVE 04/02/2020 1918   PROTEINUR NEGATIVE 04/02/2020 1918   UROBILINOGEN 1.0 08/07/2007 0800   NITRITE NEGATIVE 04/02/2020 1918   LEUKOCYTESUR SMALL (A) 04/02/2020 1918   Sepsis Labs Invalid input(s): PROCALCITONIN,  WBC,  LACTICIDVEN Microbiology Recent Results (from the past 240 hour(s))  SARS CORONAVIRUS 2 (TAT 6-24 HRS) Nasopharyngeal Nasopharyngeal Swab     Status: None   Collection Time: 04/22/20  4:40 PM   Specimen: Nasopharyngeal Swab  Result Value Ref Range Status   SARS Coronavirus 2 NEGATIVE NEGATIVE Final    Comment:  (NOTE) SARS-CoV-2 target nucleic acids are NOT DETECTED.  The SARS-CoV-2 RNA is generally detectable in upper and lower respiratory specimens during the acute phase of infection. Negative results do not preclude SARS-CoV-2 infection, do not rule out co-infections with other pathogens, and should not be used as the sole basis for treatment or other patient management decisions. Negative results must be combined with clinical observations, patient history, and epidemiological information. The expected result is Negative.  Fact Sheet for Patients: HairSlick.no  Fact Sheet for Healthcare Providers: quierodirigir.com  This test is not yet approved or cleared by the [place] FDA and  has been authorized for detection and/or diagnosis of SARS-CoV-2 by FDA under an Emergency Use Authorization (EUA). This EUA will remain  in effect (meaning this test can be used) for the duration of the COVID-19 declaration under Se ction 564(b)(1) of the Act, 21 U.S.C. section 360bbb-3(b)(1), unless the authorization is terminated or revoked sooner.  Performed at [facility], [address] [place], [place] [address]    Time coordinating discharge: 35 minutes  SIGNED:  Merlene Laughter, DO [facility] 04/23/2020, 11:00 AM Pager is on AMION  If 7PM-7AM, please contact night-coverage [email or web address]

## 2020-10-28 ENCOUNTER — Other Ambulatory Visit: Payer: Self-pay

## 2020-10-28 ENCOUNTER — Emergency Department (HOSPITAL_COMMUNITY): Payer: Medicaid Other

## 2020-10-28 ENCOUNTER — Encounter (HOSPITAL_COMMUNITY): Payer: Self-pay

## 2020-10-28 ENCOUNTER — Inpatient Hospital Stay (HOSPITAL_COMMUNITY)
Admission: EM | Admit: 2020-10-28 | Discharge: 2020-11-02 | DRG: 480 | Disposition: A | Discharge status: Skilled Nursing Facility | Payer: Medicaid Other | Source: Skilled Nursing Facility | Attending: Family Medicine | Admitting: Family Medicine

## 2020-10-28 DIAGNOSIS — S7221XA Displaced subtrochanteric fracture of right femur, initial encounter for closed fracture: Secondary | ICD-10-CM | POA: Diagnosis present

## 2020-10-28 DIAGNOSIS — K08109 Complete loss of teeth, unspecified cause, unspecified class: Secondary | ICD-10-CM | POA: Diagnosis present

## 2020-10-28 DIAGNOSIS — R41841 Cognitive communication deficit: Secondary | ICD-10-CM | POA: Diagnosis present

## 2020-10-28 DIAGNOSIS — Z7982 Long term (current) use of aspirin: Secondary | ICD-10-CM | POA: Diagnosis not present

## 2020-10-28 DIAGNOSIS — E43 Unspecified severe protein-calorie malnutrition: Secondary | ICD-10-CM | POA: Diagnosis present

## 2020-10-28 DIAGNOSIS — G40909 Epilepsy, unspecified, not intractable, without status epilepticus: Secondary | ICD-10-CM | POA: Diagnosis present

## 2020-10-28 DIAGNOSIS — S72141A Displaced intertrochanteric fracture of right femur, initial encounter for closed fracture: Secondary | ICD-10-CM | POA: Diagnosis present

## 2020-10-28 DIAGNOSIS — F0390 Unspecified dementia without behavioral disturbance: Secondary | ICD-10-CM | POA: Diagnosis present

## 2020-10-28 DIAGNOSIS — G9349 Other encephalopathy: Secondary | ICD-10-CM

## 2020-10-28 DIAGNOSIS — J45909 Unspecified asthma, uncomplicated: Secondary | ICD-10-CM | POA: Diagnosis present

## 2020-10-28 DIAGNOSIS — Z7984 Long term (current) use of oral hypoglycemic drugs: Secondary | ICD-10-CM

## 2020-10-28 DIAGNOSIS — Z20822 Contact with and (suspected) exposure to covid-19: Secondary | ICD-10-CM | POA: Diagnosis present

## 2020-10-28 DIAGNOSIS — I10 Essential (primary) hypertension: Secondary | ICD-10-CM | POA: Diagnosis present

## 2020-10-28 DIAGNOSIS — Z01810 Encounter for preprocedural cardiovascular examination: Secondary | ICD-10-CM

## 2020-10-28 DIAGNOSIS — Z79899 Other long term (current) drug therapy: Secondary | ICD-10-CM

## 2020-10-28 DIAGNOSIS — E119 Type 2 diabetes mellitus without complications: Secondary | ICD-10-CM | POA: Diagnosis present

## 2020-10-28 DIAGNOSIS — Z8616 Personal history of COVID-19: Secondary | ICD-10-CM

## 2020-10-28 DIAGNOSIS — Z888 Allergy status to other drugs, medicaments and biological substances status: Secondary | ICD-10-CM

## 2020-10-28 DIAGNOSIS — G9340 Encephalopathy, unspecified: Secondary | ICD-10-CM | POA: Diagnosis present

## 2020-10-28 DIAGNOSIS — E440 Moderate protein-calorie malnutrition: Secondary | ICD-10-CM | POA: Diagnosis present

## 2020-10-28 DIAGNOSIS — E785 Hyperlipidemia, unspecified: Secondary | ICD-10-CM | POA: Diagnosis present

## 2020-10-28 DIAGNOSIS — S72001A Fracture of unspecified part of neck of right femur, initial encounter for closed fracture: Secondary | ICD-10-CM

## 2020-10-28 DIAGNOSIS — F259 Schizoaffective disorder, unspecified: Secondary | ICD-10-CM | POA: Diagnosis present

## 2020-10-28 DIAGNOSIS — D649 Anemia, unspecified: Secondary | ICD-10-CM | POA: Diagnosis present

## 2020-10-28 DIAGNOSIS — Z8249 Family history of ischemic heart disease and other diseases of the circulatory system: Secondary | ICD-10-CM | POA: Diagnosis not present

## 2020-10-28 DIAGNOSIS — T1490XA Injury, unspecified, initial encounter: Secondary | ICD-10-CM

## 2020-10-28 HISTORY — DX: Schizoaffective disorder, unspecified: F25.9

## 2020-10-28 HISTORY — DX: Essential (primary) hypertension: I10

## 2020-10-28 HISTORY — DX: Cognitive communication deficit: R41.841

## 2020-10-28 HISTORY — DX: COVID-19: U07.1

## 2020-10-28 HISTORY — DX: Unsteadiness on feet: R26.81

## 2020-10-28 HISTORY — DX: Metabolic encephalopathy: G93.41

## 2020-10-28 HISTORY — DX: Dysphagia, oropharyngeal phase: R13.12

## 2020-10-28 HISTORY — DX: Major depressive disorder, single episode, unspecified: F32.9

## 2020-10-28 HISTORY — DX: Other abnormalities of gait and mobility: R26.89

## 2020-10-28 HISTORY — DX: Muscle weakness (generalized): M62.81

## 2020-10-28 HISTORY — DX: Unspecified convulsions: R56.9

## 2020-10-28 HISTORY — DX: Unspecified sequelae of unspecified cerebrovascular disease: I69.90

## 2020-10-28 HISTORY — DX: Type 2 diabetes mellitus without complications: E11.9

## 2020-10-28 HISTORY — DX: Unspecified severe protein-calorie malnutrition: E43

## 2020-10-28 HISTORY — DX: Unspecified asthma, uncomplicated: J45.909

## 2020-10-28 HISTORY — DX: Disorder of kidney and ureter, unspecified: N28.9

## 2020-10-28 LAB — COMPREHENSIVE METABOLIC PANEL
ALT: 52 U/L — ABNORMAL HIGH (ref 0–44)
AST: 25 U/L (ref 15–41)
Albumin: 2.9 g/dL — ABNORMAL LOW (ref 3.5–5.0)
Alkaline Phosphatase: 71 U/L (ref 38–126)
Anion gap: 9 (ref 5–15)
BUN: 24 mg/dL — ABNORMAL HIGH (ref 6–20)
CO2: 25 mmol/L (ref 22–32)
Calcium: 9.6 mg/dL (ref 8.9–10.3)
Chloride: 104 mmol/L (ref 98–111)
Creatinine, Ser: 0.92 mg/dL (ref 0.44–1.00)
GFR, Estimated: >60 mL/min (ref >60)
Glucose, Bld: 128 mg/dL — ABNORMAL HIGH (ref 70–99)
Potassium: 3.8 mmol/L (ref 3.5–5.1)
Sodium: 138 mmol/L (ref 135–145)
Total Bilirubin: 0.5 mg/dL (ref 0.3–1.2)
Total Protein: 6.6 g/dL (ref 6.5–8.1)

## 2020-10-28 LAB — CBC WITH DIFFERENTIAL/PLATELET
Abs Immature Granulocytes: 0.05 10*3/uL (ref 0.00–0.07)
Basophils Absolute: 0 10*3/uL (ref 0.0–0.1)
Basophils Relative: 0 %
Eosinophils Absolute: 0.4 10*3/uL (ref 0.0–0.5)
Eosinophils Relative: 4 %
HCT: 28.5 % — ABNORMAL LOW (ref 36.0–46.0)
Hemoglobin: 9.1 g/dL — ABNORMAL LOW (ref 12.0–15.0)
Immature Granulocytes: 1 %
Lymphocytes Relative: 25 %
Lymphs Abs: 2.5 10*3/uL (ref 0.7–4.0)
MCH: 29.8 pg (ref 26.0–34.0)
MCHC: 31.9 g/dL (ref 30.0–36.0)
MCV: 93.4 fL (ref 80.0–100.0)
Monocytes Absolute: 0.9 10*3/uL (ref 0.1–1.0)
Monocytes Relative: 9 %
Neutro Abs: 6.2 10*3/uL (ref 1.7–7.7)
Neutrophils Relative %: 61 %
Platelets: 379 10*3/uL (ref 150–400)
RBC: 3.05 MIL/uL — ABNORMAL LOW (ref 3.87–5.11)
RDW: 13.5 % (ref 11.5–15.5)
WBC: 10.1 10*3/uL (ref 4.0–10.5)
nRBC: 0 % (ref 0.0–0.2)

## 2020-10-28 LAB — TYPE AND SCREEN
ABO/RH(D): O POS
Antibody Screen: NEGATIVE

## 2020-10-28 LAB — PROTIME-INR
INR: 1.1 (ref 0.8–1.2)
Prothrombin Time: 13.3 seconds (ref 11.4–15.2)

## 2020-10-28 LAB — RESP PANEL BY RT-PCR (FLU A&B, COVID) ARPGX2
Influenza A by PCR: NEGATIVE
Influenza B by PCR: NEGATIVE
SARS Coronavirus 2 by RT PCR: NEGATIVE

## 2020-10-28 LAB — PHOSPHORUS: Phosphorus: 3.7 mg/dL (ref 2.5–4.6)

## 2020-10-28 LAB — MAGNESIUM: Magnesium: 1.8 mg/dL (ref 1.7–2.4)

## 2020-10-28 MED ORDER — HYDROCODONE-ACETAMINOPHEN 5-325 MG PO TABS: 1.0000 | ORAL_TABLET | Freq: Four times a day (QID) | ORAL | Status: DC | PRN

## 2020-10-28 MED ORDER — MORPHINE SULFATE (PF) 2 MG/ML IV SOLN: 0.5000 mg | INTRAVENOUS | Status: DC | PRN

## 2020-10-28 MED ORDER — ENOXAPARIN SODIUM 30 MG/0.3ML ~~LOC~~ SOLN: 30.0000 mg | SUBCUTANEOUS | Status: DC

## 2020-10-28 MED ORDER — ACETAMINOPHEN 325 MG PO TABS: 650.0000 mg | ORAL_TABLET | Freq: Four times a day (QID) | ORAL | Status: DC | PRN

## 2020-10-28 MED ORDER — HEPARIN SODIUM (PORCINE) 5000 UNIT/ML IJ SOLN
5000.0000 [IU] | Freq: Three times a day (TID) | INTRAMUSCULAR | Status: DC
  Administered 2020-10-29 – 2020-11-02 (×13): 5000 [IU] via SUBCUTANEOUS
  Filled 2020-10-28 (×13): qty 1

## 2020-10-28 MED ORDER — ONDANSETRON HCL 4 MG PO TABS: 4.0000 mg | ORAL_TABLET | Freq: Four times a day (QID) | ORAL | Status: DC | PRN

## 2020-10-28 MED ORDER — ACETAMINOPHEN 650 MG RE SUPP: 650.0000 mg | Freq: Four times a day (QID) | RECTAL | Status: DC | PRN

## 2020-10-28 MED ORDER — ONDANSETRON HCL 4 MG/2ML IJ SOLN: 4.0000 mg | Freq: Four times a day (QID) | INTRAMUSCULAR | Status: DC | PRN

## 2020-10-28 NOTE — ED Notes (Signed)
Pt has 1+ right pedal pulse, cap refill less than 3 sec, warm to touch, sensation intact pt able to wiggle toes.

## 2020-10-28 NOTE — H&P (Signed)
History and Physical    Tamara [name] YIA:[id number] DOB: 02-08-1961 DOA: 10/28/2020  PCP: Tamara Sharps, NP  Patient coming from: Home.  I have personally briefly reviewed patient's old medical records in [facility]  Chief Complaint: Right hip fracture.  HPI: Tamara [name] is a 60 y.o. female with medical history significant of asthma, cognitive communication deficit, memory changes, schizoaffective disorder, history of metabolic encephalopathy, seizure disorder, history of COVID-19, type II DM, dysphagia, hypertension, major depressive disorder, severe protein calorie nutrition who was brought via EMS from [place] due to right hip x-ray from 10/22/2020 suspicious for a nondisplaced femoral neck fracture.  Patient is usually mobile in a wheelchair, but the facility staff stated that she has not been mobile over the last few days.  She is unable to provide further information at this time.  ED Course: Initial vital signs were temperature 99.4 F, pulse 79, respiration 15, BP 116/72 mmHg and O2 sat 98% on room air.  Labwork: CBC shows a white count of 10.1, hemoglobin 9.1 g/dL and platelets 707.  PT was 13.3 and INR 1.1.  Magnesium is 1.8 and phosphorus 3.7 mg/dL.  CMP shows a glucose of 128 and BUN of 24 mg/dL.  Albumin was 2.9 g/dL.  ALT is slightly elevated at 52 units/L.  The rest of the CMP results are within expected values.  Coronavirus PCR was negative.  Imaging: Right hip x-ray shows an acute, minimally displaced intratrochanteric fracture of the proximal right femur.  CT head without contrast did not show any acute intracranial pathology.  Please see images and full radiology report for further detail.  Review of Systems: Unable to obtain.  Past Medical History:  Diagnosis Date  . Asthma   . Cognitive communication deficit   . COVID-19   . Diabetes mellitus without complication (HCC)   . Dysphagia, oropharyngeal phase   . Hypertension   . Major depressive disorder  with single episode   . Memory changes   . Metabolic encephalopathy   . Muscle weakness (generalized)   . Other abnormalities of gait and mobility   . Renal disorder   . Schizoaffective disorder (HCC)   . Seizures (HCC)   . Unspecified sequelae of unspecified cerebrovascular disease   . Unspecified severe protein-calorie malnutrition (HCC)   . Unsteadiness on feet     Past Surgical History:  Procedure Laterality Date  . IR GASTROSTOMY TUBE MOD SED  04/19/2020    Social History  reports that she has never smoked. She has never used smokeless tobacco. She reports previous alcohol use. She reports previous drug use.  Allergies  Allergen Reactions  . Chlorhexidine    Family History  Problem Relation Age of Onset  . Hypertension Mother   . Hypertension Father    Prior to Admission medications   Medication Sig Start Date End Date Taking? Authorizing Provider  Acetaminophen 500 MG capsule Place 2 capsules (1,000 mg total) into feeding tube 3 (three) times daily as needed for pain. 04/23/20  Yes Sheikh, Omair Latif, DO  albuterol (PROVENTIL HFA;VENTOLIN HFA) 108 (90 BASE) MCG/ACT inhaler Inhale 2 puffs into the lungs every 6 (six) hours as needed for wheezing. 10/04/12  Yes Johnson, Clanford L, MD  Ascorbic Acid (VITAMIN C) 1000 MG tablet Place 1,000 mg into feeding tube daily.   Yes [provider]  aspirin (ASPIRIN CHILDRENS) 81 MG chewable tablet Place 1 tablet (81 mg total) into feeding tube daily. 04/23/20  Yes Sheikh, Omair Latif, DO  atorvastatin (  LIPITOR) 80 MG tablet Place 0.5 tablets (40 mg total) into feeding tube at bedtime. 04/23/20  Yes Sheikh, Omair Latif, DO  buPROPion (WELLBUTRIN SR) 150 MG 12 hr tablet Take 150 mg by mouth daily. Via tube   Yes [provider]  Ergocalciferol (VITAMIN D2) 50 MCG (2000 UT) TABS Place 2,000 Units into feeding tube daily.   Yes [provider]  escitalopram (LEXAPRO) 10 MG tablet Place 10 mg into feeding tube daily.    Yes [provider]  Fluticasone-Salmeterol (ADVAIR) 250-50 MCG/DOSE AEPB Inhale 1 puff into the lungs 2 (two) times daily.   Yes [provider]  folic acid (FOLVITE) 1 MG tablet Place 1 tablet (1 mg total) into feeding tube daily. 04/23/20  Yes Sheikh, Omair Latif, DO  levETIRAcetam (KEPPRA) 100 MG/ML solution Place 5 mLs (500 mg total) into feeding tube 2 (two) times daily. 04/23/20  Yes Sheikh, Omair Latif, DO  loratadine (CLARITIN) 10 MG tablet Place 1 tablet (10 mg total) into feeding tube daily. 04/23/20  Yes Sheikh, Omair Latif, DO  losartan (COZAAR) 100 MG tablet Place 1 tablet (100 mg total) into feeding tube daily. 04/23/20  Yes Sheikh, Omair Latif, DO  magnesium 30 MG tablet Place 1 tablet (30 mg total) into feeding tube daily. 04/23/20  Yes Sheikh, Omair Latif, DO  melatonin 5 MG TABS Place 5 mg into feeding tube at bedtime.   Yes [provider]  metFORMIN (GLUCOPHAGE) 1000 MG tablet Place 1 tablet (1,000 mg total) into feeding tube 2 (two) times daily with a meal. 04/23/20  Yes Sheikh, Omair FranklinLatif, DO  MIRALAX 17 GM/SCOOP powder Place 17 g into feeding tube at bedtime. 04/23/20  Yes Sheikh, Omair Latif, DO  mirtazapine (REMERON) 15 MG tablet Place 15 mg into feeding tube at bedtime.   Yes [provider]  montelukast (SINGULAIR) 10 MG tablet Place 1 tablet (10 mg total) into feeding tube at bedtime. 04/23/20  Yes Sheikh, Omair Latif, DO  Multiple Vitamin (MULTIVITAMIN WITH MINERALS) TABS tablet Place 1 tablet into feeding tube daily. 04/23/20  Yes Sheikh, Omair Latif, DO  omeprazole (PRILOSEC) 20 MG capsule Take 20 mg by mouth daily. Via tube   Yes [provider]  Quercetin 250 MG TABS Give 250 mg by tube in the morning and at bedtime.   Yes [provider]  risperiDONE (RISPERDAL) 1 MG tablet Place 1 mg into feeding tube at bedtime.   Yes [provider]  thiamine 100 MG tablet Place 1 tablet (100 mg total) into feeding tube  daily. 04/23/20  Yes Sheikh, Omair Latif, DO  Water For Irrigation, Sterile (FREE WATER) SOLN Place 200 mLs into feeding tube every 8 (eight) hours. 04/23/20  Yes Sheikh, Omair Latif, DO  zinc gluconate 50 MG tablet Place 50 mg into feeding tube daily.   Yes [provider]  Dexlansoprazole 30 MG capsule Place 1 capsule (30 mg total) into feeding tube daily. Patient not taking: No sig reported 04/23/20   Marguerita MerlesSheikh, Omair Latif, DO  Nutritional Supplements (FEEDING SUPPLEMENT, OSMOLITE 1.5 CAL,) LIQD Place 1,000 mLs into feeding tube continuous. Patient not taking: Reported on 10/28/2020 04/23/20   Marguerita MerlesSheikh, Omair Latif, DO  Nutritional Supplements (FEEDING SUPPLEMENT, PROSOURCE TF,) liquid Place 45 mLs into feeding tube 2 (two) times daily. Patient not taking: Reported on 10/28/2020 04/23/20   Merlene LaughterSheikh, Omair Latif, DO   Physical Exam: Vitals:   10/28/20 1835 10/28/20 1836 10/28/20 2034  BP: 116/72  135/80  Pulse: 79  76  Resp: 15  18  Temp: 99.4 F (37.4 C)  98.9 F (37.2 C)  TempSrc: Oral  Oral  SpO2: 98%  98%  Weight:  58.2 kg   Height:  5\' 7"  (1.702 m)    Constitutional: Chronically ill-appearing, but currently in NAD Eyes: PERRL, lids and conjunctivae normal ENMT: Mucous membranes are moist. Posterior pharynx clear of any exudate or lesions.  Neck: Normal, supple, no masses, no thyromegaly Respiratory: Clear to auscultation bilaterally, no wheezing, no crackles. Normal respiratory effort. No accessory muscle use.  Cardiovascular: Regular rate and rhythm with occasional extrasystole, no murmurs / rubs / gallops. No extremity edema. 2+ pedal pulses. No carotid bruits.  Abdomen: No distention.  PEG in place.  Bowel sounds positive.  Soft, no tenderness, no masses palpated. No hepatosplenomegaly. Musculoskeletal: Shortened RLE with severely decreased right hip ROM, no contractures. Normal muscle tone.  Generalized weakness. Skin: no rashes, lesions, ulcers on very limited dermatological  examination. Neurologic: CN 2-12 grossly intact. Sensation intact, DTR normal. Strength 5/5 in all 4.  Psychiatric: Alert, oriented to name, knows she is in the hospital, disoriented to time/date and situation..   Labs on Admission: I have personally reviewed following labs and imaging studies  CBC: Recent Labs  Lab 10/28/20 2129  WBC 10.1  NEUTROABS 6.2  HGB 9.1*  HCT 28.5*  MCV 93.4  PLT 379    Basic Metabolic Panel: Recent Labs  Lab 10/28/20 2208  NA 138  K 3.8  CL 104  CO2 25  GLUCOSE 128*  BUN 24*  CREATININE 0.92  CALCIUM 9.6  MG 1.8  PHOS 3.7    GFR: Estimated Creatinine Clearance: 61.2 mL/min (by C-G formula based on SCr of 0.92 mg/dL).  Liver Function Tests: Recent Labs  Lab 10/28/20 2208  AST 25  ALT 52*  ALKPHOS 71  BILITOT 0.5  PROT 6.6  ALBUMIN 2.9*   Radiological Exams on Admission: CT Head Wo Contrast  Result Date: 10/28/2020 CLINICAL DATA:  Altered mental status. EXAM: CT HEAD WITHOUT CONTRAST TECHNIQUE: Contiguous axial images were obtained from the base of the skull through the vertex without intravenous contrast. COMPARISON:  April 02, 2020 FINDINGS: Brain: There is mild cerebral atrophy with widening of the extra-axial spaces and ventricular dilatation. There are areas of decreased attenuation within the white matter tracts of the supratentorial brain, consistent with microvascular disease changes. Vascular: No hyperdense vessel or unexpected calcification. Skull: Normal. Negative for fracture or focal lesion. Sinuses/Orbits: No acute finding. Other: None. IMPRESSION: No acute intracranial pathology. Electronically Signed   By: April 04, 2020 M.D.   On: 10/28/2020 23:27   DG Hip Unilat W or Wo Pelvis 2-3 Views Right  Result Date: 10/28/2020 CLINICAL DATA:  Pain EXAM: DG HIP (WITH OR WITHOUT PELVIS) 2-3V RIGHT COMPARISON:  None. FINDINGS: There are lucencies coursing through the greater trochanter an intratrochanteric region, consistent with  an acute, minimally displaced intratrochanteric fracture of the proximal right femur. There is osteopenia. There are moderate degenerative changes of both hips. IMPRESSION: Acute, minimally displaced intratrochanteric fracture of the proximal right femur. Electronically Signed   By: 12/28/2020 M.D.   On: 10/28/2020 20:38    EKG: Independently reviewed.   Assessment/Plan Principal problem:   Intertrochanteric fracture of right femur, closed, initial encounter (HCC) Admit to MedSurg/inpatient. Keep NPO. Buck's traction per protocol. Obtain preop EKG/CXR. Vicodin 5/325 mg every 6 hours as needed. Morphine 2 mg IVP every 2 hours for severe pain. Orthopedic surgery to follow.  Active Problems:   Type 2 diabetes mellitus (HCC) Currently NPO. Continue metformin 1000 mg p.o. twice daily. CBG monitoring with RI SS.    Dyslipidemia Continue atorvastatin 40 mg p.o. daily.    Essential hypertension Continue losartan 100 mg p.o. daily. Monitor BP, renal function and electrolytes.    Normocytic anemia Monitor hematocrit hemoglobin. Transfuse as needed.    Moderate protein malnutrition (HCC) Consult nutritional services.    DVT prophylaxis: Heparin SQ. Code Status:   Full code. Family Communication: Disposition Plan:   Patient is from:  SNF.  Anticipated DC to:  SNF.  Anticipated DC date:  11/01/2020.  Anticipated DC barriers: Clinical status.  Consults called: Admission status:  Inpatient/MedSurg.  High due to new  Severity of Illness:  Bobette Mo MD [facility]  How to contact the [facility] Attending or Consulting provider 7A - 7P or covering provider during after hours 7P -7A, for this patient?   1. Check the care team in [facility] and look for a) attending/consulting [facility] provider listed and b) the [facility] team listed 2. Log into [email or web address] and use [place]'s universal password to access. If you do not have the password, please contact the hospital  operator. 3. Locate the [facility] provider you are looking for under [facility] and page to a number that you can be directly reached. 4. If you still have difficulty reaching the provider, please page the [facility] (Director on Call) for the Hospitalists listed on amion for assistance.  10/28/2020, 11:56 PM   This document was prepared using Dragon voice recognition software and may contain some unintended transcription errors.

## 2020-10-28 NOTE — ED Notes (Signed)
Patient transported to CT 

## 2020-10-28 NOTE — ED Provider Notes (Signed)
[facility] EMERGENCY DEPARTMENT Provider Note   CSN: [id number] Arrival date & time: 10/28/20  1827     History Chief Complaint  Patient presents with  . abnormal hip x-ray    Tamara [name] is a 60 y.o. female.  HPI Patient is sent from [facility] for a right hip x-ray that was done to\25\2022 showing a "suspicious nondisplaced femoral neck fracture."  Patient is an extremely poor historian.  She cannot give any meaningful history.  She is not aware that her leg is painful until I move it, at which point time she does endorse pain in the right hip.  I called [place], I was not put through the patient's regular nurse.  The reception person advised they were familiar with the patient.  They have seen her up and ambulating intermittently since November.  At baseline, she apparently ambulates with a steady gait.  More recently, the patient reportedly has refused to ambulate stating she has too much pain.  Reportedly she is in pain sometimes sitting in her wheelchair as well.    Past Medical History:  Diagnosis Date  . Asthma   . Cognitive communication deficit   . COVID-19   . Diabetes mellitus without complication (HCC)   . Dysphagia, oropharyngeal phase   . Hypertension   . Major depressive disorder with single episode   . Memory changes   . Metabolic encephalopathy   . Muscle weakness (generalized)   . Other abnormalities of gait and mobility   . Renal disorder   . Schizoaffective disorder (HCC)   . Seizures (HCC)   . Unspecified sequelae of unspecified cerebrovascular disease   . Unspecified severe protein-calorie malnutrition (HCC)   . Unsteadiness on feet     Patient Active Problem List   Diagnosis Date Noted  . Protein-calorie malnutrition, severe 04/05/2020  . AKI (acute kidney injury) (HCC) 04/02/2020  . Acute metabolic encephalopathy 04/02/2020  . Acute encephalopathy 03/01/2020  . Asthma 03/01/2020  . Dehydration 03/01/2020  . Acute  kidney injury superimposed on CKD (HCC) 03/01/2020  . Hyperammonemia (HCC) 03/01/2020  . Elevated serum hCG 03/01/2020  . Hypermagnesemia 03/01/2020  . Fall 03/01/2020  . Vomiting 03/01/2020  . Essential hypertension 03/01/2020  . AMS (altered mental status) 03/01/2020  . Chest pain 11/03/2018  . Asthma with acute exacerbation 01/14/2013  . Diabetes (HCC) 01/14/2013  . Dyslipidemia 01/14/2013    Past Surgical History:  Procedure Laterality Date  . IR GASTROSTOMY TUBE MOD SED  04/19/2020     OB History   No obstetric history on file.     Family History  Problem Relation Age of Onset  . Hypertension Mother   . Hypertension Father     Social History   Tobacco Use  . Smoking status: Never Smoker  . Smokeless tobacco: Never Used  Substance Use Topics  . Alcohol use: Not Currently  . Drug use: Not Currently    Home Medications Prior to Admission medications   Medication Sig Start Date End Date Taking? Authorizing Provider  Acetaminophen 500 MG capsule Place 2 capsules (1,000 mg total) into feeding tube 3 (three) times daily as needed for pain. 04/23/20   Marguerita Merles Latif, DO  albuterol (PROVENTIL HFA;VENTOLIN HFA) 108 (90 BASE) MCG/ACT inhaler Inhale 2 puffs into the lungs every 6 (six) hours as needed for wheezing. 10/04/12   Johnson, Clanford L, MD  aspirin (ASPIRIN CHILDRENS) 81 MG chewable tablet Place 1 tablet (81 mg total) into feeding tube daily. 04/23/20  Sheikh, Omair Latif, DO  atorvastatin (LIPITOR) 80 MG tablet Place 0.5 tablets (40 mg total) into feeding tube at bedtime. 04/23/20   Marguerita Merles Latif, DO  buPROPion (WELLBUTRIN SR) 150 MG 12 hr tablet Take 150 mg by mouth daily.    [provider]  Dexlansoprazole 30 MG capsule Place 1 capsule (30 mg total) into feeding tube daily. 04/23/20   Marguerita Merles Latif, DO  Fluticasone-Salmeterol (ADVAIR) 250-50 MCG/DOSE AEPB Inhale 1 puff into the lungs 2 (two) times daily.    [provider]  folic  acid (FOLVITE) 1 MG tablet Place 1 tablet (1 mg total) into feeding tube daily. 04/23/20   Marguerita Merles Latif, DO  levETIRAcetam (KEPPRA) 100 MG/ML solution Place 5 mLs (500 mg total) into feeding tube 2 (two) times daily. 04/23/20   Marguerita Merles Latif, DO  loratadine (CLARITIN) 10 MG tablet Place 1 tablet (10 mg total) into feeding tube daily. 04/23/20   Marguerita Merles Latif, DO  losartan (COZAAR) 100 MG tablet Place 1 tablet (100 mg total) into feeding tube daily. 04/23/20   Marguerita Merles Latif, DO  magnesium 30 MG tablet Place 1 tablet (30 mg total) into feeding tube daily. 04/23/20   Marguerita Merles Latif, DO  metFORMIN (GLUCOPHAGE) 1000 MG tablet Place 1 tablet (1,000 mg total) into feeding tube 2 (two) times daily with a meal. 04/23/20   Sheikh, Omair Latif, DO  MIRALAX 17 GM/SCOOP powder Place 17 g into feeding tube at bedtime. 04/23/20   Sheikh, Omair Latif, DO  montelukast (SINGULAIR) 10 MG tablet Place 1 tablet (10 mg total) into feeding tube at bedtime. 04/23/20   Marguerita Merles Latif, DO  Multiple Vitamin (MULTIVITAMIN WITH MINERALS) TABS tablet Place 1 tablet into feeding tube daily. 04/23/20   Marguerita Merles Latif, DO  Nutritional Supplements (FEEDING SUPPLEMENT, OSMOLITE 1.5 CAL,) LIQD Place 1,000 mLs into feeding tube continuous. 04/23/20   Marguerita Merles Latif, DO  Nutritional Supplements (FEEDING SUPPLEMENT, PROSOURCE TF,) liquid Place 45 mLs into feeding tube 2 (two) times daily. 04/23/20   Marguerita Merles Latif, DO  thiamine 100 MG tablet Place 1 tablet (100 mg total) into feeding tube daily. 04/23/20   Marguerita Merles Latif, DO  Water For Irrigation, Sterile (FREE WATER) SOLN Place 200 mLs into feeding tube every 8 (eight) hours. 04/23/20   Marguerita Merles Latif, DO    Allergies    Chlorhexidine  Review of Systems   Review of Systems Level 5 caveat cannot obtain review of systems due to dementia Physical Exam Updated Vital Signs BP 135/80 (BP Location: Right Arm)   Pulse 76   Temp 98.9 F  (37.2 C) (Oral)   Resp 18   Ht 5\' 7"  (1.702 m)   Wt 58.2 kg   SpO2 98%   BMI 20.10 kg/m   Physical Exam Constitutional:      Comments: Patient is alert.  She does not appear distressed.  No respiratory distress.  HENT:     Head: Normocephalic and atraumatic.     Mouth/Throat:     Pharynx: Oropharynx is clear.  Eyes:     Extraocular Movements: Extraocular movements intact.  Cardiovascular:     Comments: Rate normal.  Intermittent ectopy.  No gross rub murmur gallop. Pulmonary:     Effort: Pulmonary effort is normal.     Breath sounds: Normal breath sounds.  Abdominal:     Comments: Abdomen is soft and nondistended.  There is a well-seated feeding tube with no drainage discharge or erythema  Musculoskeletal:     Comments: Lower extremities are in good condition.  The musculature is well-developed.  No significant peripheral edema.  Pedal pulses 2+ and symmetric.  Feet are warm and dry.  Patient winces in cries in pain with straight leg raise on the right.  She tolerates without difficulty on the left  Skin:    General: Skin is warm and dry.  Neurological:     Comments: Patient appears to have significant dementia.  She is alert.  She regards to me as I talk to her.  She gives simple responses that do not contain any historical information.  And ask if anything is bothering her she does show me her PEG tube.     ED Results / Procedures / Treatments   Labs (all labs ordered are listed, but only abnormal results are displayed) Labs Reviewed  RESP PANEL BY RT-PCR (FLU A&B, COVID) ARPGX2  CBC WITH DIFFERENTIAL/PLATELET  PROTIME-INR  COMPREHENSIVE METABOLIC PANEL  MAGNESIUM  PHOSPHORUS  URINALYSIS, ROUTINE W REFLEX MICROSCOPIC  TYPE AND SCREEN    EKG None  Radiology DG Hip Unilat W or Wo Pelvis 2-3 Views Right  Result Date: 10/28/2020 CLINICAL DATA:  Pain EXAM: DG HIP (WITH OR WITHOUT PELVIS) 2-3V RIGHT COMPARISON:  None. FINDINGS: There are lucencies coursing through the  greater trochanter an intratrochanteric region, consistent with an acute, minimally displaced intratrochanteric fracture of the proximal right femur. There is osteopenia. There are moderate degenerative changes of both hips. IMPRESSION: Acute, minimally displaced intratrochanteric fracture of the proximal right femur. Electronically Signed   By: Katherine Mantle M.D.   On: 10/28/2020 20:38    Procedures Procedures   Medications Ordered in ED Medications - No data to display  ED Course  I have reviewed the triage vital signs and the nursing notes.  Pertinent labs & imaging results that were available during my care of the patient were reviewed by me and considered in my medical decision making (see chart for details).  Clinical Course as of 10/28/20 2235  Thu Oct 28, 2020  2227 Consult: Reviewed with Dr. Aundria Rud orthopedics.  Request patient be made n.p.o. for midnight for probable repair tomorrow. Admit to hospitalist service. [MP]    Clinical Course User Index [MP] Arby Barrette, MD   MDM Rules/Calculators/A&P                          Patient presents as outlined.  Unclear when the injury occurred.  The clerk to whom I spoke did not see record of a fall.  Reportedly the x-ray that triggered evaluation emergency department was done 2\25.  Patient cannot give any history.  At this time, it does however appear at baseline patient ambulates.  It also appears she has pain secondary to hip fracture.  This was reviewed with Dr. Aundria Rud.  Plan will be for both palliative and functional repair of the hip.  Will admit to medical service.  Patient otherwise is alert without signs of acute decompensated illness.  Review of EMR indicates patient has had encephalopathy with chronically impaired cognitive function.  Will add CT head just to confirm no interval injury related changes. Final Clinical Impression(s) / ED Diagnoses Final diagnoses:  Closed fracture of right hip, initial encounter (HCC)   Encephalopathy chronic    Rx / DC Orders ED Discharge Orders    None       Arby Barrette, MD 10/28/20 2236

## 2020-10-28 NOTE — Progress Notes (Signed)
X-rays reviewed.  Case discussed with EDP.  Pt with acute right hip intertroch fracture.  Will need nail fixation.  Keep NPO tonight p MN, surgery tomorrow.  Full consult to come in am.

## 2020-10-28 NOTE — ED Triage Notes (Signed)
Pt arrived via [facility] from [place] for a right hip x-ray that was done 10/22/2020 and showed a "suspicious of a non-displaced femoral neck impaction fx of the right side. Per EMS staff said pt usually gets around in wheelchair herself and has not been as mobile lately. Neurovascular of right leg Is intact.

## 2020-10-29 ENCOUNTER — Inpatient Hospital Stay (HOSPITAL_COMMUNITY): Payer: Medicaid Other | Admitting: Anesthesiology

## 2020-10-29 ENCOUNTER — Encounter (HOSPITAL_COMMUNITY): Payer: Self-pay | Admitting: Internal Medicine

## 2020-10-29 ENCOUNTER — Inpatient Hospital Stay (HOSPITAL_COMMUNITY): Payer: Medicaid Other

## 2020-10-29 ENCOUNTER — Other Ambulatory Visit: Payer: Self-pay

## 2020-10-29 ENCOUNTER — Encounter (HOSPITAL_COMMUNITY)
Admission: EM | Disposition: A | Discharge status: Skilled Nursing Facility | Payer: Self-pay | Source: Skilled Nursing Facility | Attending: Family Medicine

## 2020-10-29 DIAGNOSIS — S72141A Displaced intertrochanteric fracture of right femur, initial encounter for closed fracture: Principal | ICD-10-CM

## 2020-10-29 DIAGNOSIS — I10 Essential (primary) hypertension: Secondary | ICD-10-CM

## 2020-10-29 DIAGNOSIS — D649 Anemia, unspecified: Secondary | ICD-10-CM

## 2020-10-29 DIAGNOSIS — E440 Moderate protein-calorie malnutrition: Secondary | ICD-10-CM

## 2020-10-29 DIAGNOSIS — E785 Hyperlipidemia, unspecified: Secondary | ICD-10-CM

## 2020-10-29 HISTORY — PX: INTRAMEDULLARY (IM) NAIL INTERTROCHANTERIC: SHX5875

## 2020-10-29 LAB — SURGICAL PCR SCREEN
MRSA, PCR: NEGATIVE
Staphylococcus aureus: NEGATIVE

## 2020-10-29 LAB — MAGNESIUM: Magnesium: 1.5 mg/dL — ABNORMAL LOW (ref 1.7–2.4)

## 2020-10-29 LAB — GLUCOSE, CAPILLARY
Glucose-Capillary: 103 mg/dL — ABNORMAL HIGH (ref 70–99)
Glucose-Capillary: 84 mg/dL (ref 70–99)
Glucose-Capillary: 89 mg/dL (ref 70–99)
Glucose-Capillary: 136 mg/dL — ABNORMAL HIGH (ref 70–99)

## 2020-10-29 LAB — ABO/RH: ABO/RH(D): O POS

## 2020-10-29 LAB — PHOSPHORUS: Phosphorus: 3.3 mg/dL (ref 2.5–4.6)

## 2020-10-29 SURGERY — FIXATION, FRACTURE, INTERTROCHANTERIC, WITH INTRAMEDULLARY ROD
Anesthesia: General | Site: Leg Upper | Laterality: Right

## 2020-10-29 MED ORDER — MOMETASONE FURO-FORMOTEROL FUM 200-5 MCG/ACT IN AERO
2.0000 | INHALATION_SPRAY | Freq: Two times a day (BID) | RESPIRATORY_TRACT | Status: DC
  Administered 2020-10-31 – 2020-11-02 (×5): 2 via RESPIRATORY_TRACT
  Filled 2020-10-29: qty 8.8

## 2020-10-29 MED ORDER — DOCUSATE SODIUM 100 MG PO CAPS
100.0000 mg | ORAL_CAPSULE | Freq: Two times a day (BID) | ORAL | Status: DC
  Administered 2020-10-30 – 2020-11-02 (×6): 100 mg via ORAL
  Filled 2020-10-29 (×7): qty 1

## 2020-10-29 MED ORDER — STERILE WATER FOR IRRIGATION IR SOLN
Status: DC | PRN
  Administered 2020-10-29: 1000 mL

## 2020-10-29 MED ORDER — MORPHINE SULFATE (PF) 2 MG/ML IV SOLN: 2.0000 mg | INTRAVENOUS | Status: DC | PRN

## 2020-10-29 MED ORDER — LIDOCAINE 2% (20 MG/ML) 5 ML SYRINGE
INTRAMUSCULAR | Status: DC | PRN
  Administered 2020-10-29: 60 mg via INTRAVENOUS

## 2020-10-29 MED ORDER — ROCURONIUM BROMIDE 10 MG/ML (PF) SYRINGE
PREFILLED_SYRINGE | INTRAVENOUS | Status: DC | PRN
  Administered 2020-10-29: 40 mg via INTRAVENOUS

## 2020-10-29 MED ORDER — POLYETHYLENE GLYCOL 3350 17 G PO PACK
17.0000 g | PACK | Freq: Every day | ORAL | Status: DC
  Administered 2020-10-29 – 2020-11-01 (×4): 17 g
  Filled 2020-10-29 (×4): qty 1

## 2020-10-29 MED ORDER — FREE WATER
200.0000 mL | Freq: Three times a day (TID) | Status: DC
  Administered 2020-10-29 – 2020-11-02 (×13): 200 mL

## 2020-10-29 MED ORDER — MIRTAZAPINE 15 MG PO TABS
15.0000 mg | ORAL_TABLET | Freq: Every day | ORAL | Status: DC
  Administered 2020-10-29 – 2020-11-01 (×4): 15 mg
  Filled 2020-10-29 (×4): qty 1

## 2020-10-29 MED ORDER — MENTHOL 3 MG MT LOZG: 1.0000 | LOZENGE | OROMUCOSAL | Status: DC | PRN

## 2020-10-29 MED ORDER — FENTANYL CITRATE (PF) 100 MCG/2ML IJ SOLN: 25.0000 ug | INTRAMUSCULAR | Status: DC | PRN

## 2020-10-29 MED ORDER — ASCORBIC ACID 500 MG PO TABS
1000.0000 mg | ORAL_TABLET | Freq: Every day | ORAL | Status: DC
  Administered 2020-10-30 – 2020-11-02 (×4): 1000 mg
  Filled 2020-10-29 (×4): qty 2

## 2020-10-29 MED ORDER — PHENOL 1.4 % MT LIQD: 1.0000 | OROMUCOSAL | Status: DC | PRN

## 2020-10-29 MED ORDER — METFORMIN HCL 500 MG PO TABS: 1000.0000 mg | ORAL_TABLET | Freq: Two times a day (BID) | ORAL | Status: DC

## 2020-10-29 MED ORDER — OXYCODONE HCL 5 MG/5ML PO SOLN
5.0000 mg | Freq: Once | ORAL | Status: DC | PRN
Start: 2020-10-29 — End: 2020-10-29

## 2020-10-29 MED ORDER — BUPROPION HCL ER (SR) 150 MG PO TB12
150.0000 mg | ORAL_TABLET | Freq: Every day | ORAL | Status: DC
  Administered 2020-10-30 – 2020-11-02 (×4): 150 mg via ORAL
  Filled 2020-10-29 (×4): qty 1

## 2020-10-29 MED ORDER — FOLIC ACID 1 MG PO TABS
1.0000 mg | ORAL_TABLET | Freq: Every day | ORAL | Status: DC
  Administered 2020-10-30 – 2020-11-02 (×4): 1 mg
  Filled 2020-10-29 (×4): qty 1

## 2020-10-29 MED ORDER — VITAMIN D 25 MCG (1000 UNIT) PO TABS
2000.0000 [IU] | ORAL_TABLET | Freq: Every day | ORAL | Status: DC
  Administered 2020-10-30 – 2020-11-02 (×4): 2000 [IU]
  Filled 2020-10-29 (×4): qty 2

## 2020-10-29 MED ORDER — PROPOFOL 10 MG/ML IV BOLUS
INTRAVENOUS | Status: AC
  Filled 2020-10-29: qty 20

## 2020-10-29 MED ORDER — HYDROCODONE-ACETAMINOPHEN 5-325 MG PO TABS
1.0000 | ORAL_TABLET | Freq: Four times a day (QID) | ORAL | Status: DC | PRN
  Administered 2020-10-30: 1 via ORAL
  Filled 2020-10-29: qty 1

## 2020-10-29 MED ORDER — METOCLOPRAMIDE HCL 5 MG/ML IJ SOLN: 5.0000 mg | Freq: Three times a day (TID) | INTRAMUSCULAR | Status: DC | PRN

## 2020-10-29 MED ORDER — HALOPERIDOL LACTATE 5 MG/ML IJ SOLN
1.0000 mg | Freq: Four times a day (QID) | INTRAMUSCULAR | Status: DC | PRN
  Administered 2020-10-30 – 2020-10-31 (×2): 1 mg via INTRAVENOUS
  Filled 2020-10-29 (×2): qty 1

## 2020-10-29 MED ORDER — LACTATED RINGERS IV SOLN: INTRAVENOUS | Status: DC | PRN

## 2020-10-29 MED ORDER — MIDAZOLAM HCL 2 MG/2ML IJ SOLN
INTRAMUSCULAR | Status: AC
  Filled 2020-10-29: qty 2

## 2020-10-29 MED ORDER — ASPIRIN 81 MG PO CHEW: 81.0000 mg | CHEWABLE_TABLET | Freq: Every day | ORAL | Status: DC

## 2020-10-29 MED ORDER — FENTANYL CITRATE (PF) 100 MCG/2ML IJ SOLN
INTRAMUSCULAR | Status: DC | PRN
  Administered 2020-10-29 (×3): 50 ug via INTRAVENOUS

## 2020-10-29 MED ORDER — FENTANYL CITRATE (PF) 250 MCG/5ML IJ SOLN
INTRAMUSCULAR | Status: AC
  Filled 2020-10-29: qty 5

## 2020-10-29 MED ORDER — TRAMADOL HCL 50 MG PO TABS: 50.0000 mg | ORAL_TABLET | Freq: Four times a day (QID) | ORAL | 0 refills | Status: AC | PRN

## 2020-10-29 MED ORDER — ZINC SULFATE 220 (50 ZN) MG PO CAPS
220.0000 mg | ORAL_CAPSULE | Freq: Every day | ORAL | Status: DC
  Administered 2020-10-30 – 2020-11-02 (×4): 220 mg via ORAL
  Filled 2020-10-29 (×4): qty 1

## 2020-10-29 MED ORDER — ONDANSETRON HCL 4 MG/2ML IJ SOLN: 4.0000 mg | Freq: Four times a day (QID) | INTRAMUSCULAR | Status: DC | PRN

## 2020-10-29 MED ORDER — THIAMINE HCL 100 MG PO TABS
100.0000 mg | ORAL_TABLET | Freq: Every day | ORAL | Status: DC
  Administered 2020-10-30 – 2020-11-02 (×4): 100 mg
  Filled 2020-10-29 (×4): qty 1

## 2020-10-29 MED ORDER — RISPERIDONE 1 MG PO TABS
1.0000 mg | ORAL_TABLET | Freq: Every day | ORAL | Status: DC
  Administered 2020-10-29 – 2020-11-01 (×4): 1 mg
  Filled 2020-10-29 (×5): qty 1

## 2020-10-29 MED ORDER — LEVETIRACETAM 100 MG/ML PO SOLN
500.0000 mg | Freq: Two times a day (BID) | ORAL | Status: DC
  Administered 2020-10-29 – 2020-11-02 (×8): 500 mg
  Filled 2020-10-29 (×10): qty 5

## 2020-10-29 MED ORDER — POVIDONE-IODINE 10 % EX SWAB: 2.0000 "application " | Freq: Once | CUTANEOUS | Status: DC

## 2020-10-29 MED ORDER — QUERCETIN 250 MG PO TABS: 250.0000 mg | ORAL_TABLET | Freq: Two times a day (BID) | ORAL | Status: DC

## 2020-10-29 MED ORDER — OSMOLITE 1.5 CAL PO LIQD
1000.0000 mL | ORAL | Status: DC
  Administered 2020-10-29 – 2020-11-01 (×3): 1000 mL
  Filled 2020-10-29 (×4): qty 1000

## 2020-10-29 MED ORDER — CEFAZOLIN SODIUM-DEXTROSE 2-4 GM/100ML-% IV SOLN
2.0000 g | INTRAVENOUS | Status: AC
  Administered 2020-10-29: 2 g via INTRAVENOUS
  Filled 2020-10-29 (×2): qty 100

## 2020-10-29 MED ORDER — PROMETHAZINE HCL 25 MG/ML IJ SOLN: 6.2500 mg | INTRAMUSCULAR | Status: DC | PRN

## 2020-10-29 MED ORDER — MELATONIN 5 MG PO TABS
5.0000 mg | ORAL_TABLET | Freq: Every day | ORAL | Status: DC
  Administered 2020-10-29 – 2020-11-01 (×4): 5 mg
  Filled 2020-10-29 (×4): qty 1

## 2020-10-29 MED ORDER — METOCLOPRAMIDE HCL 5 MG PO TABS: 5.0000 mg | ORAL_TABLET | Freq: Three times a day (TID) | ORAL | Status: DC | PRN

## 2020-10-29 MED ORDER — OXYCODONE HCL 5 MG PO TABS: 5.0000 mg | ORAL_TABLET | Freq: Once | ORAL | Status: DC | PRN

## 2020-10-29 MED ORDER — LOSARTAN POTASSIUM 50 MG PO TABS
100.0000 mg | ORAL_TABLET | Freq: Every day | ORAL | Status: DC
  Administered 2020-10-30 – 2020-11-02 (×4): 100 mg
  Filled 2020-10-29 (×4): qty 2

## 2020-10-29 MED ORDER — PANTOPRAZOLE SODIUM 40 MG PO TBEC
40.0000 mg | DELAYED_RELEASE_TABLET | Freq: Every day | ORAL | Status: DC
  Administered 2020-10-30 – 2020-11-02 (×4): 40 mg via ORAL
  Filled 2020-10-29 (×4): qty 1

## 2020-10-29 MED ORDER — CEFAZOLIN SODIUM-DEXTROSE 2-4 GM/100ML-% IV SOLN
2.0000 g | Freq: Four times a day (QID) | INTRAVENOUS | Status: AC
  Administered 2020-10-29 – 2020-10-30 (×2): 2 g via INTRAVENOUS
  Filled 2020-10-29 (×2): qty 100

## 2020-10-29 MED ORDER — ACETAMINOPHEN 500 MG PO TABS: 1000.0000 mg | ORAL_TABLET | Freq: Once | ORAL | Status: DC

## 2020-10-29 MED ORDER — MONTELUKAST SODIUM 10 MG PO TABS
10.0000 mg | ORAL_TABLET | Freq: Every day | ORAL | Status: DC
  Administered 2020-10-29 – 2020-11-01 (×4): 10 mg
  Filled 2020-10-29 (×4): qty 1

## 2020-10-29 MED ORDER — ATORVASTATIN CALCIUM 40 MG PO TABS
40.0000 mg | ORAL_TABLET | Freq: Every day | ORAL | Status: DC
  Administered 2020-10-29 – 2020-11-01 (×4): 40 mg
  Filled 2020-10-29 (×4): qty 1

## 2020-10-29 MED ORDER — MAGNESIUM OXIDE 400 (241.3 MG) MG PO TABS
200.0000 mg | ORAL_TABLET | Freq: Every day | ORAL | Status: DC
  Administered 2020-10-30 – 2020-11-02 (×4): 200 mg
  Filled 2020-10-29 (×4): qty 1

## 2020-10-29 MED ORDER — PHENYLEPHRINE HCL (PRESSORS) 10 MG/ML IV SOLN
INTRAVENOUS | Status: DC | PRN
  Administered 2020-10-29: 100 ug via INTRAVENOUS

## 2020-10-29 MED ORDER — ASPIRIN EC 325 MG PO TBEC
325.0000 mg | DELAYED_RELEASE_TABLET | Freq: Every day | ORAL | Status: DC
  Administered 2020-10-30 – 2020-11-02 (×4): 325 mg via ORAL
  Filled 2020-10-29 (×4): qty 1

## 2020-10-29 MED ORDER — PROPOFOL 10 MG/ML IV BOLUS
INTRAVENOUS | Status: DC | PRN
  Administered 2020-10-29: 120 mg via INTRAVENOUS

## 2020-10-29 MED ORDER — ESCITALOPRAM OXALATE 10 MG PO TABS
10.0000 mg | ORAL_TABLET | Freq: Every day | ORAL | Status: DC
  Administered 2020-10-30 – 2020-11-02 (×4): 10 mg
  Filled 2020-10-29 (×4): qty 1

## 2020-10-29 MED ORDER — SUGAMMADEX SODIUM 200 MG/2ML IV SOLN
INTRAVENOUS | Status: DC | PRN
  Administered 2020-10-29: 200 mg via INTRAVENOUS

## 2020-10-29 MED ORDER — 0.9 % SODIUM CHLORIDE (POUR BTL) OPTIME
TOPICAL | Status: DC | PRN
  Administered 2020-10-29: 1000 mL

## 2020-10-29 MED ORDER — LORATADINE 10 MG PO TABS
10.0000 mg | ORAL_TABLET | Freq: Every day | ORAL | Status: DC
  Administered 2020-10-30 – 2020-11-02 (×4): 10 mg
  Filled 2020-10-29 (×4): qty 1

## 2020-10-29 MED ORDER — ALBUTEROL SULFATE HFA 108 (90 BASE) MCG/ACT IN AERS: 2.0000 | INHALATION_SPRAY | Freq: Four times a day (QID) | RESPIRATORY_TRACT | Status: DC | PRN

## 2020-10-29 MED ORDER — LACTATED RINGERS IV SOLN: INTRAVENOUS | Status: DC

## 2020-10-29 MED ORDER — ONDANSETRON HCL 4 MG PO TABS: 4.0000 mg | ORAL_TABLET | Freq: Four times a day (QID) | ORAL | Status: DC | PRN

## 2020-10-29 MED ORDER — PROSOURCE TF PO LIQD
45.0000 mL | Freq: Three times a day (TID) | ORAL | Status: DC
  Administered 2020-10-29 – 2020-11-02 (×12): 45 mL
  Filled 2020-10-29 (×16): qty 45

## 2020-10-29 SURGICAL SUPPLY — 41 items
ALCOHOL 70% 16 OZ (MISCELLANEOUS) ×2 IMPLANT
BIT DRILL CALIBRATED 4.2 (BIT) IMPLANT
BIT DRILL CANN 16 HIP (BIT) ×1 IMPLANT
BIT DRILL CANN STP 6/9 HIP (BIT) ×1 IMPLANT
BNDG COHESIVE 6X5 TAN STRL LF (GAUZE/BANDAGES/DRESSINGS) ×3 IMPLANT
CANISTER SUCT 3000ML PPV (MISCELLANEOUS) ×2 IMPLANT
COVER PERINEAL POST (MISCELLANEOUS) ×2 IMPLANT
COVER SURGICAL LIGHT HANDLE (MISCELLANEOUS) ×2 IMPLANT
COVER WAND RF STERILE (DRAPES) ×1 IMPLANT
DRAPE C-ARM 42X72 X-RAY (DRAPES) ×2 IMPLANT
DRAPE HALF SHEET 40X57 (DRAPES) IMPLANT
DRAPE INCISE IOBAN 66X45 STRL (DRAPES) ×2 IMPLANT
DRAPE STERI IOBAN 125X83 (DRAPES) ×2 IMPLANT
DRILL BIT CALIBRATED 4.2 (BIT) ×2
DRSG ADAPTIC 3X8 NADH LF (GAUZE/BANDAGES/DRESSINGS) ×1 IMPLANT
DURAPREP 26ML APPLICATOR (WOUND CARE) ×2 IMPLANT
ELECT CAUTERY BLADE 6.4 (BLADE) ×2 IMPLANT
ELECT REM PT RETURN 9FT ADLT (ELECTROSURGICAL) ×2
ELECTRODE REM PT RTRN 9FT ADLT (ELECTROSURGICAL) ×1 IMPLANT
GAUZE SPONGE 4X4 12PLY STRL LF (GAUZE/BANDAGES/DRESSINGS) ×2 IMPLANT
GLOVE BIO SURGEON STRL SZ7.5 (GLOVE) ×4 IMPLANT
GLOVE SRG 8 PF TXTR STRL LF DI (GLOVE) ×2 IMPLANT
GLOVE SURG UNDER POLY LF SZ8 (GLOVE) ×4
GOWN STRL REUS W/ TWL LRG LVL3 (GOWN DISPOSABLE) ×1 IMPLANT
GOWN STRL REUS W/ TWL XL LVL3 (GOWN DISPOSABLE) ×2 IMPLANT
GOWN STRL REUS W/TWL LRG LVL3 (GOWN DISPOSABLE) ×2
GOWN STRL REUS W/TWL XL LVL3 (GOWN DISPOSABLE) ×4
GUIDEWIRE 3.2X400 (WIRE) ×1 IMPLANT
KIT BASIN OR (CUSTOM PROCEDURE TRAY) ×2 IMPLANT
KIT TURNOVER KIT B (KITS) ×2 IMPLANT
NAIL CANN TFNA 9MM/130 DEG TI (Nail) ×1 IMPLANT
NS IRRIG 1000ML POUR BTL (IV SOLUTION) ×2 IMPLANT
PACK GENERAL/GYN (CUSTOM PROCEDURE TRAY) ×2 IMPLANT
PAD ARMBOARD 7.5X6 YLW CONV (MISCELLANEOUS) ×4 IMPLANT
SCREW LAG TFNA 90 HIP (Screw) ×1 IMPLANT
SCREW LOCK STAR 5X36 (Screw) ×1 IMPLANT
STAPLER VISISTAT 35W (STAPLE) ×2 IMPLANT
SUT MON AB 2-0 CT1 36 (SUTURE) ×2 IMPLANT
TOWEL GREEN STERILE (TOWEL DISPOSABLE) ×2 IMPLANT
TOWEL GREEN STERILE FF (TOWEL DISPOSABLE) ×2 IMPLANT
WATER STERILE IRR 1000ML POUR (IV SOLUTION) ×2 IMPLANT

## 2020-10-29 NOTE — Progress Notes (Addendum)
PROGRESS NOTE   Tamara [name]  FIE:[id number] DOB: January 31, 1961 DOA: 10/28/2020 PCP: [name], NP   Chief Complaint  Patient presents with  . abnormal hip x-ray   Level of care: Med-Surg  Brief Admission History:  60 y.o. female with medical history significant of asthma, cognitive communication deficit, memory changes, schizoaffective disorder, history of metabolic encephalopathy, seizure disorder, history of COVID-19, type II DM, dysphagia, hypertension, major depressive disorder, severe protein calorie nutrition who was brought via EMS from [place] due to right hip x-ray from 10/22/2020 suspicious for a nondisplaced femoral neck fracture.  Patient is usually mobile in a wheelchair, but the facility staff stated that she has not been mobile over the last few days.  She is unable to provide further information at this time.  ED Course: Initial vital signs were temperature 99.4 F, pulse 79, respiration 15, BP 116/72 mmHg and O2 sat 98% on room air  Assessment & Plan:   Principal Problem:   Intertrochanteric fracture of right femur, closed, initial encounter (HCC) Active Problems:   Type 2 diabetes mellitus (HCC)   Dyslipidemia   Essential hypertension   Normocytic anemia   Moderate protein malnutrition (HCC)   1. Acute right intertrochanteric hip fracture - Ortho consulted and planning operative management later today.  NPO for now.  Analgesics as ordered.  2. Dyslipidemia -resume home atorvastatin.  3. Type 2 diabetes mellitus - SSI coverage ordered and CBG testing.  4. Essential hypertension - resumed home med.  5. Normocytic anemia - Monitoring closely.  6. Severe protein calorie malnutrition - Dietitian consulted to restart tube feeding after surgery.     DVT prophylaxis:  SQ hep Code Status: full  Family Communication:  Disposition: SNF  Status is: Inpatient  Remains inpatient appropriate because:IV treatments appropriate due to intensity of illness or  inability to take PO and Inpatient level of care appropriate due to severity of illness   Dispo: The patient is from: SNF              Anticipated d/c is to: SNF              Patient currently is not medically stable to d/c.   Difficult to place patient No  Consultants:   orthopedics  Procedures:     Antimicrobials:    Subjective: Pt reports no specific complaints.    Objective: Vitals:   10/28/20 1836 10/28/20 2034 10/29/20 0218 10/29/20 0746  BP:  135/80 (!) 150/88 138/79  Pulse:  76 69 75  Resp:  18 17 18   Temp:  98.9 F (37.2 C) 99.7 F (37.6 C) 98.7 F (37.1 C)  TempSrc:  Oral Oral   SpO2:  98% 99% 100%  Weight: 58.2 kg     Height: 5\' 7"  (1.702 m)      No intake or output data in the 24 hours ending 10/29/20 1128 Filed Weights   10/28/20 1836  Weight: 58.2 kg   Examination:  General exam: Appears calm and comfortable.   Respiratory system: Clear to auscultation. Respiratory effort normal. Cardiovascular system: normal S1 & S2 heard. No JVD, murmurs, rubs, gallops or clicks. No pedal edema. Gastrointestinal system: Abdomen is nondistended, soft and nontender. No organomegaly or masses felt. Normal bowel sounds heard. Central nervous system: Alert and oriented. No focal neurological deficits. Extremities: no bruising seen LLE or edema.  Skin: No rashes, lesions or ulcers Psychiatry: Judgement and insight poor. Mood & affect appropriate.   Data Reviewed: I have personally reviewed  following labs and imaging studies  CBC: Recent Labs  Lab 10/28/20 2129  WBC 10.1  NEUTROABS 6.2  HGB 9.1*  HCT 28.5*  MCV 93.4  PLT 379    Basic Metabolic Panel: Recent Labs  Lab 10/28/20 2208  NA 138  K 3.8  CL 104  CO2 25  GLUCOSE 128*  BUN 24*  CREATININE 0.92  CALCIUM 9.6  MG 1.8  PHOS 3.7    GFR: Estimated Creatinine Clearance: 61.2 mL/min (by C-G formula based on SCr of 0.92 mg/dL).  Liver Function Tests: Recent Labs  Lab 10/28/20 2208  AST  25  ALT 52*  ALKPHOS 71  BILITOT 0.5  PROT 6.6  ALBUMIN 2.9*    CBG: Recent Labs  Lab 10/29/20 0627  GLUCAP 103*    Recent Results (from the past 240 hour(s))  Resp Panel by RT-PCR (Flu A&B, Covid) Nasopharyngeal Swab     Status: None   Collection Time: 10/28/20 10:00 PM   Specimen: Nasopharyngeal Swab; Nasopharyngeal(NP) swabs in vial transport medium  Result Value Ref Range Status   SARS Coronavirus 2 by RT PCR NEGATIVE NEGATIVE Final    Comment: (NOTE) SARS-CoV-2 target nucleic acids are NOT DETECTED.  The SARS-CoV-2 RNA is generally detectable in upper respiratory specimens during the acute phase of infection. The lowest concentration of SARS-CoV-2 viral copies this assay can detect is 138 copies/mL. A negative result does not preclude SARS-Cov-2 infection and should not be used as the sole basis for treatment or other patient management decisions. A negative result may occur with  improper specimen collection/handling, submission of specimen other than nasopharyngeal swab, presence of viral mutation(s) within the areas targeted by this assay, and inadequate number of viral copies(<138 copies/mL). A negative result must be combined with clinical observations, patient history, and epidemiological information. The expected result is Negative.  Fact Sheet for Patients:  BloggerCourse.com  Fact Sheet for Healthcare Providers:  SeriousBroker.it  This test is no t yet approved or cleared by the [place] FDA and  has been authorized for detection and/or diagnosis of SARS-CoV-2 by FDA under an Emergency Use Authorization (EUA). This EUA will remain  in effect (meaning this test can be used) for the duration of the COVID-19 declaration under Section 564(b)(1) of the Act, 21 U.S.C.section 360bbb-3(b)(1), unless the authorization is terminated  or revoked sooner.       Influenza A by PCR NEGATIVE NEGATIVE Final    Influenza B by PCR NEGATIVE NEGATIVE Final    Comment: (NOTE) The Xpert Xpress SARS-CoV-2/FLU/RSV plus assay is intended as an aid in the diagnosis of influenza from Nasopharyngeal swab specimens and should not be used as a sole basis for treatment. Nasal washings and aspirates are unacceptable for Xpert Xpress SARS-CoV-2/FLU/RSV testing.  Fact Sheet for Patients: BloggerCourse.com  Fact Sheet for Healthcare Providers: SeriousBroker.it  This test is not yet approved or cleared by the [place] FDA and has been authorized for detection and/or diagnosis of SARS-CoV-2 by FDA under an Emergency Use Authorization (EUA). This EUA will remain in effect (meaning this test can be used) for the duration of the COVID-19 declaration under Section 564(b)(1) of the Act, 21 U.S.C. section 360bbb-3(b)(1), unless the authorization is terminated or revoked.  Performed at [facility], [address] [place], [place] [address]   Surgical pcr screen     Status: None   Collection Time: 10/29/20  8:47 AM   Specimen: Nasal Mucosa; Nasal Swab  Result Value Ref Range Status  MRSA, PCR NEGATIVE NEGATIVE Final   Staphylococcus aureus NEGATIVE NEGATIVE Final    Comment: (NOTE) The Xpert SA Assay (FDA approved for NASAL specimens in patients 43 years of age and older), is one component of a comprehensive surveillance program. It is not intended to diagnose infection nor to guide or monitor treatment. Performed at [facility], [address], [place], [place] [address]      Radiology Studies: CT Head Wo Contrast  Result Date: 10/28/2020 CLINICAL DATA:  Altered mental status. EXAM: CT HEAD WITHOUT CONTRAST TECHNIQUE: Contiguous axial images were obtained from the base of the skull through the vertex without intravenous contrast. COMPARISON:  April 02, 2020 FINDINGS: Brain: There is mild cerebral atrophy with widening of the extra-axial  spaces and ventricular dilatation. There are areas of decreased attenuation within the white matter tracts of the supratentorial brain, consistent with microvascular disease changes. Vascular: No hyperdense vessel or unexpected calcification. Skull: Normal. Negative for fracture or focal lesion. Sinuses/Orbits: No acute finding. Other: None. IMPRESSION: No acute intracranial pathology. Electronically Signed   By: Aram Candela M.D.   On: 10/28/2020 23:27   Chest Portable 1 View  Result Date: 10/29/2020 CLINICAL DATA:  Preoperative evaluation. EXAM: PORTABLE CHEST 1 VIEW COMPARISON:  April 02, 2020 FINDINGS: The heart size and mediastinal contours are within normal limits. Both lungs are clear. The visualized skeletal structures are unremarkable. IMPRESSION: No active disease. Electronically Signed   By: Aram Candela M.D.   On: 10/29/2020 00:22   DG Hip Unilat W or Wo Pelvis 2-3 Views Right  Result Date: 10/28/2020 CLINICAL DATA:  Pain EXAM: DG HIP (WITH OR WITHOUT PELVIS) 2-3V RIGHT COMPARISON:  None. FINDINGS: There are lucencies coursing through the greater trochanter an intratrochanteric region, consistent with an acute, minimally displaced intratrochanteric fracture of the proximal right femur. There is osteopenia. There are moderate degenerative changes of both hips. IMPRESSION: Acute, minimally displaced intratrochanteric fracture of the proximal right femur. Electronically Signed   By: Katherine Mantle M.D.   On: 10/28/2020 20:38    Scheduled Meds: . vitamin C  1,000 mg Per Tube Daily  . aspirin  81 mg Per Tube Daily  . atorvastatin  40 mg Per Tube QHS  . buPROPion  150 mg Oral Daily  . cholecalciferol  2,000 Units Per Tube Daily  . escitalopram  10 mg Per Tube Daily  . feeding supplement (PROSource TF)  45 mL Per Tube TID  . folic acid  1 mg Per Tube Daily  . free water  200 mL Per Tube Q8H  . heparin  5,000 Units Subcutaneous Q8H  . levETIRAcetam  500 mg Per Tube BID  .  loratadine  10 mg Per Tube Daily  . losartan  100 mg Per Tube Daily  . magnesium oxide  200 mg Per Tube Daily  . melatonin  5 mg Per Tube QHS  . metFORMIN  1,000 mg Oral BID WC  . mirtazapine  15 mg Per Tube QHS  . mometasone-formoterol  2 puff Inhalation BID  . montelukast  10 mg Per Tube QHS  . pantoprazole  40 mg Oral Daily  . polyethylene glycol  17 g Per Tube QHS  . povidone-iodine  2 application Topical Once  . risperiDONE  1 mg Per Tube QHS  . thiamine  100 mg Per Tube Daily  . zinc sulfate  220 mg Oral Daily   Continuous Infusions: .  ceFAZolin (ANCEF) IV    . feeding supplement (OSMOLITE  1.5 CAL)       LOS: 1 day   Time spent: 38 mins   Raequon Catanzaro Laural BenesJohnson, MD How to contact the [facility] Attending or Consulting provider 7A - 7P or covering provider during after hours 7P -7A, for this patient?  1. Check the care team in [facility] and look for a) attending/consulting [facility] provider listed and b) the [facility] team listed 2. Log into [email or web address] and use [facility]'s universal password to access. If you do not have the password, please contact the hospital operator. 3. Locate the [facility] provider you are looking for under [facility] and page to a number that you can be directly reached. 4. If you still have difficulty reaching the provider, please page the [facility] (Director on Call) for the Hospitalists listed on amion for assistance.  10/29/2020, 11:28 AM

## 2020-10-29 NOTE — Consult Note (Addendum)
Reason for Consult:Right hip fx Referring Physician: Maryln Manuel Time called: 0730 Time at bedside: 0834   Tamara [name] is an 60 y.o. female.  HPI: Linder was brought to the ED after an x-ray at the SNF where she resides was worrisome for a hip fx. Neither she nor the facility know what happened or when. She had been mobile in her WC as usual over the last several days. She has some cognitive issues that make reliable history taking suspect.  Past Medical History:  Diagnosis Date  . Asthma   . Cognitive communication deficit   . COVID-19   . Diabetes mellitus without complication (HCC)   . Dysphagia, oropharyngeal phase   . Hypertension   . Major depressive disorder with single episode   . Memory changes   . Metabolic encephalopathy   . Muscle weakness (generalized)   . Other abnormalities of gait and mobility   . Renal disorder   . Schizoaffective disorder (HCC)   . Seizures (HCC)   . Unspecified sequelae of unspecified cerebrovascular disease   . Unspecified severe protein-calorie malnutrition (HCC)   . Unsteadiness on feet     Past Surgical History:  Procedure Laterality Date  . IR GASTROSTOMY TUBE MOD SED  04/19/2020    Family History  Problem Relation Age of Onset  . Hypertension Mother   . Hypertension Father     Social History:  reports that she has never smoked. She has never used smokeless tobacco. She reports previous alcohol use. She reports previous drug use.  Allergies:  Allergies  Allergen Reactions  . Chlorhexidine     Medications: I have reviewed the patient's current medications.  Results for orders placed or performed during the hospital encounter of 10/28/20 (from the past 48 hour(s))  CBC with Differential     Status: Abnormal   Collection Time: 10/28/20  9:29 PM  Result Value Ref Range   WBC 10.1 4.0 - 10.5 K/uL   RBC 3.05 (L) 3.87 - 5.11 MIL/uL   Hemoglobin 9.1 (L) 12.0 - 15.0 g/dL   HCT 03.5 (L) 00.9 - 38.1 %   MCV 93.4 80.0 - 100.0 fL    MCH 29.8 26.0 - 34.0 pg   MCHC 31.9 30.0 - 36.0 g/dL   RDW 82.9 93.7 - 16.9 %   Platelets 379 150 - 400 K/uL   nRBC 0.0 0.0 - 0.2 %   Neutrophils Relative % 61 %   Neutro Abs 6.2 1.7 - 7.7 K/uL   Lymphocytes Relative 25 %   Lymphs Abs 2.5 0.7 - 4.0 K/uL   Monocytes Relative 9 %   Monocytes Absolute 0.9 0.1 - 1.0 K/uL   Eosinophils Relative 4 %   Eosinophils Absolute 0.4 0.0 - 0.5 K/uL   Basophils Relative 0 %   Basophils Absolute 0.0 0.0 - 0.1 K/uL   Immature Granulocytes 1 %   Abs Immature Granulocytes 0.05 0.00 - 0.07 K/uL    Comment: Performed at [facility], [address], [place], [place] [address]  Protime-INR     Status: None   Collection Time: 10/28/20  9:29 PM  Result Value Ref Range   Prothrombin Time 13.3 11.4 - 15.2 seconds   INR 1.1 0.8 - 1.2    Comment: (NOTE) INR goal varies based on device and disease states. Performed at [facility], [address] [place], [place] [address]   Resp Panel by RT-PCR (Flu A&B, Covid) Nasopharyngeal Swab     Status: None  Collection Time: 10/28/20 10:00 PM   Specimen: Nasopharyngeal Swab; Nasopharyngeal(NP) swabs in vial transport medium  Result Value Ref Range   SARS Coronavirus 2 by RT PCR NEGATIVE NEGATIVE    Comment: (NOTE) SARS-CoV-2 target nucleic acids are NOT DETECTED.  The SARS-CoV-2 RNA is generally detectable in upper respiratory specimens during the acute phase of infection. The lowest concentration of SARS-CoV-2 viral copies this assay can detect is 138 copies/mL. A negative result does not preclude SARS-Cov-2 infection and should not be used as the sole basis for treatment or other patient management decisions. A negative result may occur with  improper specimen collection/handling, submission of specimen other than nasopharyngeal swab, presence of viral mutation(s) within the areas targeted by this assay, and inadequate number of viral copies(<138 copies/mL). A negative result must be  combined with clinical observations, patient history, and epidemiological information. The expected result is Negative.  Fact Sheet for Patients:  BloggerCourse.com  Fact Sheet for Healthcare Providers:  SeriousBroker.it  This test is no t yet approved or cleared by the [place] FDA and  has been authorized for detection and/or diagnosis of SARS-CoV-2 by FDA under an Emergency Use Authorization (EUA). This EUA will remain  in effect (meaning this test can be used) for the duration of the COVID-19 declaration under Section 564(b)(1) of the Act, 21 U.S.C.section 360bbb-3(b)(1), unless the authorization is terminated  or revoked sooner.       Influenza A by PCR NEGATIVE NEGATIVE   Influenza B by PCR NEGATIVE NEGATIVE    Comment: (NOTE) The Xpert Xpress SARS-CoV-2/FLU/RSV plus assay is intended as an aid in the diagnosis of influenza from Nasopharyngeal swab specimens and should not be used as a sole basis for treatment. Nasal washings and aspirates are unacceptable for Xpert Xpress SARS-CoV-2/FLU/RSV testing.  Fact Sheet for Patients: BloggerCourse.com  Fact Sheet for Healthcare Providers: SeriousBroker.it  This test is not yet approved or cleared by the [place] FDA and has been authorized for detection and/or diagnosis of SARS-CoV-2 by FDA under an Emergency Use Authorization (EUA). This EUA will remain in effect (meaning this test can be used) for the duration of the COVID-19 declaration under Section 564(b)(1) of the Act, 21 U.S.C. section 360bbb-3(b)(1), unless the authorization is terminated or revoked.  Performed at [facility], [address] [place], [place] [address]   Comprehensive metabolic panel     Status: Abnormal   Collection Time: 10/28/20 10:08 PM  Result Value Ref Range   Sodium 138 135 - 145 mmol/L   Potassium 3.8 3.5 - 5.1 mmol/L    Chloride 104 98 - 111 mmol/L   CO2 25 22 - 32 mmol/L   Glucose, Bld 128 (H) 70 - 99 mg/dL    Comment: Glucose reference range applies only to samples taken after fasting for at least 8 hours.   BUN 24 (H) 6 - 20 mg/dL   Creatinine, Ser 8.65 0.44 - 1.00 mg/dL   Calcium 9.6 8.9 - 78.4 mg/dL   Total Protein 6.6 6.5 - 8.1 g/dL   Albumin 2.9 (L) 3.5 - 5.0 g/dL   AST 25 15 - 41 U/L   ALT 52 (H) 0 - 44 U/L   Alkaline Phosphatase 71 38 - 126 U/L   Total Bilirubin 0.5 0.3 - 1.2 mg/dL   GFR, Estimated >69 >62 mL/min    Comment: (NOTE) Calculated using the CKD-EPI Creatinine Equation (2021)    Anion gap 9 5 - 15    Comment: Performed at  [facility], [address] [place], [place], [place] [address]  Magnesium     Status: None   Collection Time: 10/28/20 10:08 PM  Result Value Ref Range   Magnesium 1.8 1.7 - 2.4 mg/dL    Comment: Performed at [facility], [address], [place], [place] [address]  Phosphorus     Status: None   Collection Time: 10/28/20 10:08 PM  Result Value Ref Range   Phosphorus 3.7 2.5 - 4.6 mg/dL    Comment: Performed at [facility], [address], [place], [place] [address]  Type and screen [facility]     Status: None   Collection Time: 10/28/20 10:17 PM  Result Value Ref Range   ABO/RH(D) O POS    Antibody Screen NEG    Sample Expiration      10/31/2020,2359 Performed at [facility], [address], [place], [place] [address]   Glucose, capillary     Status: Abnormal   Collection Time: 10/29/20  6:27 AM  Result Value Ref Range   Glucose-Capillary 103 (H) 70 - 99 mg/dL    Comment: Glucose reference range applies only to samples taken after fasting for at least 8 hours.  ABO/Rh     Status: None (Preliminary result)   Collection Time: 10/29/20  8:12 AM  Result Value Ref Range   ABO/RH(D) PENDING     CT Head Wo Contrast  Result Date: 10/28/2020 CLINICAL DATA:  Altered mental status. EXAM: CT HEAD WITHOUT CONTRAST  TECHNIQUE: Contiguous axial images were obtained from the base of the skull through the vertex without intravenous contrast. COMPARISON:  April 02, 2020 FINDINGS: Brain: There is mild cerebral atrophy with widening of the extra-axial spaces and ventricular dilatation. There are areas of decreased attenuation within the white matter tracts of the supratentorial brain, consistent with microvascular disease changes. Vascular: No hyperdense vessel or unexpected calcification. Skull: Normal. Negative for fracture or focal lesion. Sinuses/Orbits: No acute finding. Other: None. IMPRESSION: No acute intracranial pathology. Electronically Signed   By: Aram Candela M.D.   On: 10/28/2020 23:27   Chest Portable 1 View  Result Date: 10/29/2020 CLINICAL DATA:  Preoperative evaluation. EXAM: PORTABLE CHEST 1 VIEW COMPARISON:  April 02, 2020 FINDINGS: The heart size and mediastinal contours are within normal limits. Both lungs are clear. The visualized skeletal structures are unremarkable. IMPRESSION: No active disease. Electronically Signed   By: Aram Candela M.D.   On: 10/29/2020 00:22   DG Hip Unilat W or Wo Pelvis 2-3 Views Right  Result Date: 10/28/2020 CLINICAL DATA:  Pain EXAM: DG HIP (WITH OR WITHOUT PELVIS) 2-3V RIGHT COMPARISON:  None. FINDINGS: There are lucencies coursing through the greater trochanter an intratrochanteric region, consistent with an acute, minimally displaced intratrochanteric fracture of the proximal right femur. There is osteopenia. There are moderate degenerative changes of both hips. IMPRESSION: Acute, minimally displaced intratrochanteric fracture of the proximal right femur. Electronically Signed   By: Katherine Mantle M.D.   On: 10/28/2020 20:38    Review of Systems  Unable to perform ROS: Dementia   Blood pressure 138/79, pulse 75, temperature 98.7 F (37.1 C), resp. rate 18, height 5\' 7"  (1.702 m), weight 58.2 kg, SpO2 100 %. Physical Exam Constitutional:       General: She is not in acute distress.    Appearance: She is well-developed and well-nourished. She is not diaphoretic.  HENT:     Head: Normocephalic and atraumatic.  Eyes:  General: No scleral icterus.       Right eye: No discharge.        Left eye: No discharge.     Conjunctiva/sclera: Conjunctivae normal.  Cardiovascular:     Rate and Rhythm: Normal rate and regular rhythm.  Pulmonary:     Effort: Pulmonary effort is normal. No respiratory distress.  Musculoskeletal:     Cervical back: Normal range of motion.     Comments: LLE No traumatic wounds, ecchymosis, or rash  Mild TTP hip  No knee or ankle effusion  Knee stable to varus/ valgus and anterior/posterior stress  Sens DPN, SPN, TN intact  Motor EHL, ext, flex, evers 5/5  DP 2+, PT 2+, No significant edema  Skin:    General: Skin is warm and dry.  Neurological:     Mental Status: She is alert.  Psychiatric:        Mood and Affect: Mood and affect normal.        Behavior: Behavior normal.     Assessment/Plan: Right hip fx -- Plan IMN today with Dr. Aundria Rudogers. Please keep NPO. Multiple medical problems including asthma, cognitive communication deficit, memory changes, schizoaffective disorder, history of metabolic encephalopathy, seizure disorder, history of COVID-19, type II DM, dysphagia, hypertension, major depressive disorder, and severe protein calorie nutrition -- per primary service    Freeman CaldronMichael J. Edita Weyenberg, PA-C Orthopedic Surgery [phone] 10/29/2020, 8:40 AM

## 2020-10-29 NOTE — Brief Op Note (Signed)
10/28/2020 - 10/29/2020  4:38 PM  PATIENT:  Tamara [name]  60 y.o. female  PRE-OPERATIVE DIAGNOSIS:  Right hip fracture  POST-OPERATIVE DIAGNOSIS:  Right hip fracture  PROCEDURE:  Procedure(s): INTRAMEDULLARY (IM) NAIL INTERTROCHANTRIC (Right)  SURGEON:  Surgeon(s) and Role:    * Yolonda Kida, MD - Primary  PHYSICIAN ASSISTANT: Dion Saucier, PA-C  ANESTHESIA:   general  EBL:  50 cc  BLOOD ADMINISTERED:none  DRAINS: none   LOCAL MEDICATIONS USED:  NONE  SPECIMEN:  No Specimen  DISPOSITION OF SPECIMEN:  N/A  COUNTS:  YES  TOURNIQUET:  * No tourniquets in log *  DICTATION: .Note written in EPIC  PLAN OF CARE: Admit to inpatient   PATIENT DISPOSITION:  PACU - hemodynamically stable.   Delay start of Pharmacological VTE agent (>24hrs) due to surgical blood loss or risk of bleeding: not applicable

## 2020-10-29 NOTE — ED Notes (Signed)
Attempted report on 5n desk phone, kept ringing and got busy signal.

## 2020-10-29 NOTE — Anesthesia Procedure Notes (Signed)
Procedure Name: Intubation Date/Time: 10/29/2020 3:43 PM Performed by: Eligha Bridegroom, CRNA Pre-anesthesia Checklist: Patient identified, Emergency Drugs available, Suction available, Patient being monitored and Timeout performed Patient Re-evaluated:Patient Re-evaluated prior to induction Oxygen Delivery Method: Circle system utilized Preoxygenation: Pre-oxygenation with 100% oxygen Induction Type: IV induction Ventilation: Mask ventilation without difficulty Laryngoscope Size: Mac and 3 Grade View: Grade I Tube type: Oral Tube size: 7.0 mm Number of attempts: 1 Placement Confirmation: ETT inserted through vocal cords under direct vision,  positive ETCO2 and breath sounds checked- equal and bilateral Secured at: 21 cm Tube secured with: Tape Dental Injury: Teeth and Oropharynx as per pre-operative assessment

## 2020-10-29 NOTE — Anesthesia Preprocedure Evaluation (Addendum)
Anesthesia Evaluation  Patient identified by MRN, date of birth, ID band Patient awake and Patient confused  General Assessment Comment:Oriented to person only  Reviewed: Allergy & Precautions, NPO status , Patient's Chart, lab work & pertinent test results  History of Anesthesia Complications Negative for: history of anesthetic complications  Airway Mallampati: II  TM Distance: >3 FB Neck ROM: Full    Dental  (+) Edentulous Lower, Edentulous Upper   Pulmonary asthma ,    Pulmonary exam normal        Cardiovascular hypertension, Pt. on medications Normal cardiovascular exam  TTE 2020: EF 50-55%, impaired relaxation, valves ok   Neuro/Psych Seizures -,  Depression Schizophrenia    GI/Hepatic Neg liver ROS, GERD  Medicated and Controlled,  Endo/Other  diabetes, Type 2, Oral Hypoglycemic Agents  Renal/GU ARFRenal disease  negative genitourinary   Musculoskeletal negative musculoskeletal ROS (+)   Abdominal   Peds  Hematology  (+) anemia , Hgb 9.1   Anesthesia Other Findings Day of surgery medications reviewed with patient.  Reproductive/Obstetrics negative OB ROS                           Anesthesia Physical Anesthesia Plan  ASA: II  Anesthesia Plan: General   Post-op Pain Management:    Induction: Intravenous  PONV Risk Score and Plan: 3 and Treatment may vary due to age or medical condition, Ondansetron, Dexamethasone and Midazolam  Airway Management Planned: Oral ETT  Additional Equipment: None  Intra-op Plan:   Post-operative Plan: Extubation in OR  Informed Consent: I have reviewed the patients History and Physical, chart, labs and discussed the procedure including the risks, benefits and alternatives for the proposed anesthesia with the patient or authorized representative who has indicated his/her understanding and acceptance.     Dental advisory given and Consent  reviewed with POA  Plan Discussed with: CRNA  Anesthesia Plan Comments: (Consent reviewed with patient's daughter via telephone. All questions answered. Stephannie Peters, MD)       Anesthesia Quick Evaluation

## 2020-10-29 NOTE — Transfer of Care (Signed)
Immediate Anesthesia Transfer of Care Note  Patient: Lashawnta Seaman  Procedure(s) Performed: INTRAMEDULLARY (IM) NAIL INTERTROCHANTRIC (Right Leg Upper)  Patient Location: PACU  Anesthesia Type:General  Level of Consciousness: drowsy  Airway & Oxygen Therapy: Patient Spontanous Breathing  Post-op Assessment: Report given to RN and Post -op Vital signs reviewed and stable  Post vital signs: Reviewed and stable  Last Vitals:  Vitals Value Taken Time  BP    Temp    Pulse 74 10/29/20 1712  Resp    SpO2 100 % 10/29/20 1712  Vitals shown include unvalidated device data.  Last Pain:  Vitals:   10/29/20 0218  TempSrc: Oral  PainSc:          Complications: No complications documented.

## 2020-10-29 NOTE — Progress Notes (Signed)
Orthopedic Tech Progress Note Patient Details:  Tamara [name] 23-Feb-1961 [id number] RN called requesting an ABDOMINAL BINDER for patient. Dropped off to RN. Said he'll apply it.  Ortho Devices Type of Ortho Device: Abdominal binder Ortho Device/Splint Location: STOMACH Ortho Device/Splint Interventions: Ordered,Other (comment)   Post Interventions Patient Tolerated: Well Instructions Provided: Care of device   Donald Pore 10/29/2020, 2:31 PM

## 2020-10-29 NOTE — ED Notes (Signed)
Attempted report on 5n by calling charge nurse number, it just rang and stated call can not be completed at this time.

## 2020-10-29 NOTE — Progress Notes (Signed)
Initial Nutrition Assessment  DOCUMENTATION CODES:   Non-severe (moderate) malnutrition in context of chronic illness  INTERVENTION:   Initiate tube feeding via PEG after surgery: Osmolite 1.5 at 45 ml/h (1080 ml per day) Prosource TF 45 ml TID  Provides 1740 kcal, 100 gm protein, 820 ml free water daily  200 ml free water every 8 hours Total free water: 1420 ml   NUTRITION DIAGNOSIS:   Moderate Malnutrition related to chronic illness (DM, schizoaffective disorder, major depressive disorder) as evidenced by severe fat depletion,mild fat depletion,mild muscle depletion,moderate muscle depletion,severe muscle depletion.  GOAL:   Patient will meet greater than or equal to 90% of their needs  MONITOR:   TF tolerance  REASON FOR ASSESSMENT:   Consult Hip fracture protocol  ASSESSMENT:   Pt with PMH of asthma, cognitive communication deficit, memory changes, schizoaffective disorder, sz disorder, COVID-19, DM, dysphagia with PEG placement 04/19/20, major depressive disorder, HTN, malnutrition and wheelchair bound found to be less mobile and admitted from SNF [facility]) with R hip fx.   Plan for IMN 3/4.  Spoke with MD, ok to resume TF post op. Orders placed.   Medications reviewed and include: vitamin C, vitamin D, folic acid, mag-ox 200 mg daily, glucophage, remeron, miralax, thiamine, zinc Labs reviewed:  CBG: 103   20 F PEG   NUTRITION - FOCUSED PHYSICAL EXAM:  Flowsheet Row Most Recent Value  Orbital Region Mild depletion  Upper Arm Region Severe depletion  Thoracic and Lumbar Region Severe depletion  Buccal Region Mild depletion  Temple Region Moderate depletion  Clavicle Bone Region Mild depletion  Clavicle and Acromion Bone Region Mild depletion  Scapular Bone Region Severe depletion  Dorsal Hand Mild depletion  Patellar Region Severe depletion  Anterior Thigh Region Severe depletion  Posterior Calf Region Severe depletion  Edema (RD Assessment)  None  Hair Reviewed  Eyes Reviewed  Mouth Reviewed  [edentulous]  Skin Reviewed  Nails Reviewed       Diet Order:   Diet Order            Diet NPO time specified  Diet effective now                 EDUCATION NEEDS:   No education needs have been identified at this time  Skin:  Skin Assessment: Reviewed RN Assessment  Last BM:  unknown  Height:   Ht Readings from Last 1 Encounters:  10/28/20 5\' 7"  (1.702 m)    Weight:   Wt Readings from Last 1 Encounters:  10/28/20 58.2 kg    Ideal Body Weight:  61.3 kg  BMI:  Body mass index is 20.1 kg/m.  Estimated Nutritional Needs:   Kcal:  1700-1900  Protein:  85-100 grams  Fluid:  >1.6 L/day  12/28/20., RD, LDN, CNSC See AMiON for contact information

## 2020-10-29 NOTE — Op Note (Signed)
Date of Surgery: 10/29/2020  INDICATIONS: Ms. Peffley is a 60 y.o.-year-old female who sustained a right hip fracture. The risks and benefits of the procedure discussed with the family and health care power of attorney prior to the procedure and all questions were answered; consent was obtained.  PREOPERATIVE DIAGNOSIS: right hip fracture   POSTOPERATIVE DIAGNOSIS: Same   PROCEDURE: Treatment of intertrochanteric, pertrochanteric, subtrochanteric fracture with intramedullary implant. CPT 937 046 [id number]   SURGEON: Kathi Der. Aundria Rud, M.D.   ANESTHESIA: general   IV FLUIDS AND URINE: See anesthesia record   ESTIMATED BLOOD LOSS: 50 cc  IMPLANTS:  Synthes TFNA 9 x 170 90 mm lag screw 5.0 x 36 mm distal interlock  DRAINS: None.   COMPLICATIONS: None.   DESCRIPTION OF PROCEDURE: The patient was brought to the operating room and placed supine on the operating table. The patient's leg had been signed prior to the procedure. The patient had the anesthesia placed by the anesthesiologist. The prep verification and incision time-outs were performed to confirm that this was the correct patient, site, side and location. The patient had an SCD on the opposite lower extremity. The patient did receive antibiotics prior to the incision and was re-dosed during the procedure as needed at indicated intervals. The patient was positioned on the fracture table with the table in traction and internal rotation to reduce the hip. The well leg was placed in a scissor position and all bony prominences were well-padded. The patient had the lower extremity prepped and draped in the standard surgical fashion. The incision was made 4 finger breadths superior to the greater trochanter. A guide pin was inserted into the tip of the greater trochanter under fluoroscopic guidance. An opening reamer was used to gain access to the femoral canal. The nail length was measured and inserted down the femoral canal to its proper depth. The  appropriate version of insertion for the lag screw was found under fluoroscopy. A pin was inserted up the femoral neck through the jig. The length of the lag screw was then measured. The lag screw was inserted as near to center-center in the head as possible. The leg was taken out of traction, then the compression screw was used to compress across the fracture. Compression was visualized on serial xrays.   We next turned our attention to the distal interlocking screw.  This was placed through the drill guide of the nail inserter.  A small incision was made overlying the lateral thigh at the screw site, and a tonsil was used to disect down to bone.  A drill pass was made through the jig and across the nail through both cortices.  This was measured, and the appropriate screw was placed under hand power and found to have good bite.    The wound was copiously irrigated with saline and the subcutaneous layer closed with 2.0 vicryl and the skin was reapproximated with staples. The wounds were cleaned and dried a final time and a sterile dressing was placed. The hip was taken through a range of motion at the end of the case under fluoroscopic imaging to visualize the approach-withdraw phenomenon and confirm implant length in the head. The patient was then awakened from anesthesia and taken to the recovery room in stable condition. All counts were correct at the end of the case.   POSTOPERATIVE PLAN: The patient will be weight bearing as tolerated and will return in 2 weeks for staple removal and the patient will receive DVT prophylaxis based on  other medications, activity level, and risk ratio of bleeding to thrombosis.  Our recommendation is continuation of her 81 mg asa daily x 6 weeks   Maryan Rued, MD [facility] [phone] 4:39 PM

## 2020-10-29 NOTE — Progress Notes (Signed)
Patient arrived on the floor from the ED at 0158 via stretcher. Patient transferred to bed, cleaned up, skin check completed, and vital signs taken. Risk bracelets applied and admission assessment completed to the best of my ability. Will review orders and proceed with POC.

## 2020-10-30 DIAGNOSIS — R41841 Cognitive communication deficit: Secondary | ICD-10-CM

## 2020-10-30 DIAGNOSIS — F259 Schizoaffective disorder, unspecified: Secondary | ICD-10-CM | POA: Diagnosis present

## 2020-10-30 LAB — COMPREHENSIVE METABOLIC PANEL
ALT: 33 U/L (ref 0–44)
AST: 22 U/L (ref 15–41)
Albumin: 2.8 g/dL — ABNORMAL LOW (ref 3.5–5.0)
Alkaline Phosphatase: 74 U/L (ref 38–126)
Anion gap: 10 (ref 5–15)
BUN: 15 mg/dL (ref 6–20)
CO2: 23 mmol/L (ref 22–32)
Calcium: 9 mg/dL (ref 8.9–10.3)
Chloride: 100 mmol/L (ref 98–111)
Creatinine, Ser: 0.67 mg/dL (ref 0.44–1.00)
GFR, Estimated: >60 mL/min (ref >60)
Glucose, Bld: 147 mg/dL — ABNORMAL HIGH (ref 70–99)
Potassium: 3.8 mmol/L (ref 3.5–5.1)
Sodium: 133 mmol/L — ABNORMAL LOW (ref 135–145)
Total Bilirubin: 0.5 mg/dL (ref 0.3–1.2)
Total Protein: 6.4 g/dL — ABNORMAL LOW (ref 6.5–8.1)

## 2020-10-30 LAB — CBC WITH DIFFERENTIAL/PLATELET
Abs Immature Granulocytes: 0.04 10*3/uL (ref 0.00–0.07)
Basophils Absolute: 0 10*3/uL (ref 0.0–0.1)
Basophils Relative: 0 %
Eosinophils Absolute: 0.1 10*3/uL (ref 0.0–0.5)
Eosinophils Relative: 1 %
HCT: 29.2 % — ABNORMAL LOW (ref 36.0–46.0)
Hemoglobin: 9.6 g/dL — ABNORMAL LOW (ref 12.0–15.0)
Immature Granulocytes: 0 %
Lymphocytes Relative: 17 %
Lymphs Abs: 1.7 10*3/uL (ref 0.7–4.0)
MCH: 29.8 pg (ref 26.0–34.0)
MCHC: 32.9 g/dL (ref 30.0–36.0)
MCV: 90.7 fL (ref 80.0–100.0)
Monocytes Absolute: 0.9 10*3/uL (ref 0.1–1.0)
Monocytes Relative: 9 %
Neutro Abs: 7.4 10*3/uL (ref 1.7–7.7)
Neutrophils Relative %: 73 %
Platelets: 386 10*3/uL (ref 150–400)
RBC: 3.22 MIL/uL — ABNORMAL LOW (ref 3.87–5.11)
RDW: 13.2 % (ref 11.5–15.5)
WBC: 10.2 10*3/uL (ref 4.0–10.5)
nRBC: 0 % (ref 0.0–0.2)

## 2020-10-30 LAB — GLUCOSE, CAPILLARY
Glucose-Capillary: 124 mg/dL — ABNORMAL HIGH (ref 70–99)
Glucose-Capillary: 151 mg/dL — ABNORMAL HIGH (ref 70–99)
Glucose-Capillary: 151 mg/dL — ABNORMAL HIGH (ref 70–99)
Glucose-Capillary: 155 mg/dL — ABNORMAL HIGH (ref 70–99)
Glucose-Capillary: 192 mg/dL — ABNORMAL HIGH (ref 70–99)

## 2020-10-30 LAB — PHOSPHORUS: Phosphorus: 2.8 mg/dL (ref 2.5–4.6)

## 2020-10-30 LAB — MAGNESIUM: Magnesium: 1.5 mg/dL — ABNORMAL LOW (ref 1.7–2.4)

## 2020-10-30 MED ORDER — MAGNESIUM SULFATE 4 GM/100ML IV SOLN
4.0000 g | Freq: Once | INTRAVENOUS | Status: AC
  Administered 2020-10-30: 4 g via INTRAVENOUS
  Filled 2020-10-30: qty 100

## 2020-10-30 NOTE — Progress Notes (Signed)
    Subjective: 1 Day Post-Op Procedure(s) (LRB): INTRAMEDULLARY (IM) NAIL INTERTROCHANTRIC (Right) Patient reports pain as 3 on 0-10 scale.   Denies CP or SOB.  Voiding without difficulty. Positive flatus. Objective: Vital signs in last 24 hours: Temp:  [97.6 F (36.4 C)-98.9 F (37.2 C)] 98 F (36.7 C) (03/05 0441) Pulse Rate:  [72-82] 77 (03/05 0441) Resp:  [13-18] 18 (03/05 0441) BP: (128-172)/(79-95) 155/86 (03/05 0441) SpO2:  [95 %-100 %] 100 % (03/05 0441) Weight:  [48.5 kg] 48.5 kg (03/05 0500)  Intake/Output from previous day: 03/04 0701 - 03/05 0700 In: 1500 [I.V.:1400; IV Piggyback:100] Out: 75 [Blood:75] Intake/Output this shift: No intake/output data recorded.  Labs: Recent Labs    10/28/20 2129 10/30/20 0536  HGB 9.1* 9.6*   Recent Labs    10/28/20 2129 10/30/20 0536  WBC 10.1 10.2  RBC 3.05* 3.22*  HCT 28.5* 29.2*  PLT 379 386   Recent Labs    10/28/20 2208  NA 138  K 3.8  CL 104  CO2 25  BUN 24*  CREATININE 0.92  GLUCOSE 128*  CALCIUM 9.6   Recent Labs    10/28/20 2129  INR 1.1    Physical Exam: Neurologically intact Sensation intact distally Intact pulses distally Incision: dressing C/D/I Compartment soft Body mass index is 16.75 kg/m.   Assessment/Plan: 1 Day Post-Op Procedure(s) (LRB): INTRAMEDULLARY (IM) NAIL INTERTROCHANTRIC (Right) Advance diet Up with therapy  No new recommendations per ortho.  Patient with confusion currently with restraints.  Will defer mental status changes and management to medical team. HCT improving. PT today.  WBAT  Alvy Beal for Dr. Venita Lick [facility] [phone] 10/30/2020, 6:32 AM

## 2020-10-30 NOTE — Evaluation (Signed)
Physical Therapy Evaluation Patient Details Name: Tamara [name] MRN: [id number] DOB: 01/30/1961 Today's Date: 10/30/2020   History of Present Illness  Patient is a 60 year old female S/P fall with right hip fx. She underwent IM nailing on 10/29/2020. Prior to fall she was transfering to a wheelchair at her facility. The patient is a limited histroian. PMH: Protien calorie malnutrition, seizures, schizoaffective disorder, renal disorder, DMII, cognative deficit  Clinical Impression  Patient was limited today but very pleasant and willing to work with therapy. She required max a to sit up at the edge of the bed and max a to transfer back into bed. She required max a to stand and mod a to remain standing for 30 sec while her sheets were changed. She would benefit from rehab at a SNF and further skilled acute rehab to return to baseline level of function.     Follow Up Recommendations SNF    Equipment Recommendations  None recommended by PT    Recommendations for Other Services Rehab consult     Precautions / Restrictions Precautions Precautions: Fall Precaution Comments: Patient is currently on wrist restraints Restrictions Weight Bearing Restrictions: Yes RLE Weight Bearing: Weight bearing as tolerated      Mobility  Bed Mobility Overal bed mobility: Needs Assistance Bed Mobility: Supine to Sit;Sit to Supine     Supine to sit: Max assist;+2 for physical assistance;+2 for safety/equipment Sit to supine: Max assist;+2 for physical assistance;+2 for safety/equipment   General bed mobility comments: Max a to get LE and hips to the edge of bed, then max a to sit up. Max a to get legs back into bed and to scoot to the middle.    Transfers Overall transfer level: Needs assistance Equipment used: Rolling walker (2 wheeled) Transfers: Sit to/from Stand Sit to Stand: Max assist;+2 physical assistance;+2 safety/equipment         General transfer comment: max ta to stand. Abble to  maintain standing with mod a while patients bed was changed. Needed help to control descent back onto the bed  Ambulation/Gait             General Gait Details: Patient unable to take a step  Stairs            Wheelchair Mobility    Modified Rankin (Stroke Patients Only)       Balance                                             Pertinent Vitals/Pain Pain Assessment: Faces Faces Pain Scale: Hurts little more Pain Location: right hip Pain Descriptors / Indicators: Grimacing;Guarding Pain Intervention(s): Limited activity within patient's tolerance;Monitored during session;Repositioned    Home Living Family/patient expects to be discharged to:: Skilled nursing facility                 Additional Comments: Patient came from a facility. Per notes the patient was able to transfer to  a wheelchair.    Prior Function Level of Independence: Needs assistance   Gait / Transfers Assistance Needed: per notes was transfering to wheelchair  ADL's / Homemaking Assistance Needed: Some assist with ADLs and IADLs from boyfriend per pt report  Comments: Patient limited answers to questions but she was pleasent and followed simple commands.     Hand Dominance   Dominant Hand: Right    Extremity/Trunk Assessment  Upper Extremity Assessment Upper Extremity Assessment: Overall WFL for tasks assessed    Lower Extremity Assessment Lower Extremity Assessment: RLE deficits/detail RLE Deficits / Details: limited active movement of R LE RLE: Unable to fully assess due to pain;Unable to fully assess due to immobilization       Communication   Communication: Expressive difficulties  Cognition Arousal/Alertness: Awake/alert Behavior During Therapy: Impulsive Overall Cognitive Status: History of cognitive impairments - at baseline                                 General Comments: Patient has shsitory of memory and cognative deficits btu  she was pleasent and followed all simple commands      General Comments      Exercises     Assessment/Plan    PT Assessment Patient needs continued PT services  PT Problem List Decreased strength;Decreased range of motion;Decreased activity tolerance;Decreased mobility;Decreased knowledge of use of DME;Decreased safety awareness;Decreased knowledge of precautions;Pain       PT Treatment Interventions DME instruction;Gait training;Stair training;Functional mobility training;Therapeutic activities;Therapeutic exercise;Neuromuscular re-education;Patient/family education    PT Goals (Current goals can be found in the Care Plan section)  Acute Rehab PT Goals Patient Stated Goal: none stated PT Goal Formulation: With patient Time For Goal Achievement:  (w+1) Potential to Achieve Goals: Good    Frequency Min 2X/week   Barriers to discharge        Co-evaluation               AM-PAC PT "6 Clicks" Mobility  Outcome Measure Help needed turning from your back to your side while in a flat bed without using bedrails?: A Lot Help needed moving from lying on your back to sitting on the side of a flat bed without using bedrails?: A Lot Help needed moving to and from a bed to a chair (including a wheelchair)?: Total Help needed standing up from a chair using your arms (e.g., wheelchair or bedside chair)?: Total Help needed to walk in hospital room?: Total Help needed climbing 3-5 steps with a railing? : Total 6 Click Score: 8    End of Session Equipment Utilized During Treatment: Gait belt Activity Tolerance: Patient limited by fatigue;Patient limited by pain Patient left: in bed;with call bell/phone within reach;with restraints reapplied;with bed alarm set Nurse Communication: Mobility status PT Visit Diagnosis: Unsteadiness on feet (R26.81);Repeated falls (R29.6);Muscle weakness (generalized) (M62.81);Difficulty in walking, not elsewhere classified (R26.2);Pain Pain -  Right/Left: Right Pain - part of body: Hip    Time: 1430-1450 PT Time Calculation (min) (ACUTE ONLY): 20 min   Charges:   PT Evaluation $PT Eval High Complexity: 1 High           Dessie Coma PT DPT  10/30/2020, 4:08 PM

## 2020-10-30 NOTE — Progress Notes (Signed)
PROGRESS NOTE   Tamara [name]  ZOX:[id number] DOB: 10-13-1960 DOA: 10/28/2020 PCP: Lavinia SharpsPlacey, Mary Ann, NP   Chief Complaint  Patient presents with  . abnormal hip x-ray   Level of care: Med-Surg  Brief Admission History:  60 y.o. female with medical history significant of asthma, cognitive communication deficit, memory changes, schizoaffective disorder, history of metabolic encephalopathy, seizure disorder, history of COVID-19, type II DM, dysphagia, hypertension, major depressive disorder, severe protein calorie nutrition who was brought via EMS from [place] due to right hip x-ray from 10/22/2020 suspicious for a nondisplaced femoral neck fracture.  Patient is usually mobile in a wheelchair, but the facility staff stated that she has not been mobile over the last few days.  She is unable to provide further information at this time.  ED Course: Initial vital signs were temperature 99.4 F, pulse 79, respiration 15, BP 116/72 mmHg and O2 sat 98% on room air  Assessment & Plan:   Principal Problem:   Intertrochanteric fracture of right femur, closed, initial encounter (HCC) Active Problems:   Type 2 diabetes mellitus (HCC)   Dyslipidemia   Asthma   Essential hypertension   Protein-calorie malnutrition, severe   Normocytic anemia   Moderate protein malnutrition (HCC)   Schizoaffective disorder (HCC)   Cognitive communication deficit  1. Acute right intertrochanteric hip fracture - Ortho consulted and now post op day#1 s/p IM nail intertrochanteric right -  Analgesics as ordered. PT today per ortho. Hg stable.  2. Dyslipidemia -resume home atorvastatin.  3. Type 2 diabetes mellitus - SSI coverage ordered and CBG testing. BS well controlled.  4. Schizoaffective Disorder - Pt has had some mild increased agitation in setting of acute fracture, IV haldol ordered as needed. Resume home risperdal daily.   5. Essential hypertension - resumed home med.  6. Normocytic anemia - Monitoring  closely.  7. Severe protein calorie malnutrition - Dietitian consulted to restart tube feeding after surgery.  Monitoring Mg, Phos for signs of refeeding syndrome.  8. Hypomagnesemia - IV replacement ordered.      DVT prophylaxis:  SQ hep Code Status: full  Family Communication:  Disposition: SNF  Status is: Inpatient  Remains inpatient appropriate because:IV treatments appropriate due to intensity of illness or inability to take PO and Inpatient level of care appropriate due to severity of illness  Dispo: The patient is from: SNF              Anticipated d/c is to: SNF              Patient currently is not medically stable to d/c.   Difficult to place patient No  Consultants:   orthopedics  Procedures:     Antimicrobials:    Subjective: Pt having some behavioral disturbance at times.     Objective: Vitals:   10/30/20 0021 10/30/20 0441 10/30/20 0500 10/30/20 0900  BP: 128/85 (!) 155/86  (!) 158/80  Pulse: 82 77  78  Resp: 17 18  18   Temp: 98.9 F (37.2 C) 98 F (36.7 C)  98 F (36.7 C)  TempSrc: Oral   Oral  SpO2: 99% 100%  100%  Weight:   48.5 kg   Height:        Intake/Output Summary (Last 24 hours) at 10/30/2020 1432 Last data filed at 10/30/2020 0758 Gross per 24 hour  Intake 1620 ml  Output 75 ml  Net 1545 ml   Filed Weights   10/28/20 1836 10/30/20 0500  Weight: 58.2 kg 48.5  kg   Examination:  General exam: Appears calm and comfortable. Pt has dementia and behavioral disturbance.   Respiratory system: Clear to auscultation. Respiratory effort normal. Cardiovascular system: normal S1 & S2 heard. No JVD, murmurs, rubs, gallops or clicks. No pedal edema. Gastrointestinal system: Abdomen is nondistended, soft and nontender. No organomegaly or masses felt. Normal bowel sounds heard. Central nervous system: Alert and oriented. No focal neurological deficits. Extremities: no bruising seen LLE or edema.  Skin: No rashes, lesions or ulcers Psychiatry:  Judgement and insight poor. Mood & affect appropriate.   Data Reviewed: I have personally reviewed following labs and imaging studies  CBC: Recent Labs  Lab 10/28/20 2129 10/30/20 0536  WBC 10.1 10.2  NEUTROABS 6.2 7.4  HGB 9.1* 9.6*  HCT 28.5* 29.2*  MCV 93.4 90.7  PLT 379 386    Basic Metabolic Panel: Recent Labs  Lab 10/28/20 2208 10/29/20 1929 10/30/20 0536  NA 138  --  133*  K 3.8  --  3.8  CL 104  --  100  CO2 25  --  23  GLUCOSE 128*  --  147*  BUN 24*  --  15  CREATININE 0.92  --  0.67  CALCIUM 9.6  --  9.0  MG 1.8 1.5* 1.5*  PHOS 3.7 3.3 2.8    GFR: Estimated Creatinine Clearance: 58.7 mL/min (by C-G formula based on SCr of 0.67 mg/dL).  Liver Function Tests: Recent Labs  Lab 10/28/20 2208 10/30/20 0536  AST 25 22  ALT 52* 33  ALKPHOS 71 74  BILITOT 0.5 0.5  PROT 6.6 6.4*  ALBUMIN 2.9* 2.8*    CBG: Recent Labs  Lab 10/29/20 1721 10/29/20 2038 10/30/20 0042 10/30/20 0433 10/30/20 1231  GLUCAP 89 136* 192* 155* 124*    Recent Results (from the past 240 hour(s))  Resp Panel by RT-PCR (Flu A&B, Covid) Nasopharyngeal Swab     Status: None   Collection Time: 10/28/20 10:00 PM   Specimen: Nasopharyngeal Swab; Nasopharyngeal(NP) swabs in vial transport medium  Result Value Ref Range Status   SARS Coronavirus 2 by RT PCR NEGATIVE NEGATIVE Final    Comment: (NOTE) SARS-CoV-2 target nucleic acids are NOT DETECTED.  The SARS-CoV-2 RNA is generally detectable in upper respiratory specimens during the acute phase of infection. The lowest concentration of SARS-CoV-2 viral copies this assay can detect is 138 copies/mL. A negative result does not preclude SARS-Cov-2 infection and should not be used as the sole basis for treatment or other patient management decisions. A negative result may occur with  improper specimen collection/handling, submission of specimen other than nasopharyngeal swab, presence of viral mutation(s) within the areas  targeted by this assay, and inadequate number of viral copies(<138 copies/mL). A negative result must be combined with clinical observations, patient history, and epidemiological information. The expected result is Negative.  Fact Sheet for Patients:  BloggerCourse.com  Fact Sheet for Healthcare Providers:  SeriousBroker.it  This test is no t yet approved or cleared by the [place] FDA and  has been authorized for detection and/or diagnosis of SARS-CoV-2 by FDA under an Emergency Use Authorization (EUA). This EUA will remain  in effect (meaning this test can be used) for the duration of the COVID-19 declaration under Section 564(b)(1) of the Act, 21 U.S.C.section 360bbb-3(b)(1), unless the authorization is terminated  or revoked sooner.       Influenza A by PCR NEGATIVE NEGATIVE Final   Influenza B by PCR NEGATIVE NEGATIVE Final    Comment: (NOTE)  The Xpert Xpress SARS-CoV-2/FLU/RSV plus assay is intended as an aid in the diagnosis of influenza from Nasopharyngeal swab specimens and should not be used as a sole basis for treatment. Nasal washings and aspirates are unacceptable for Xpert Xpress SARS-CoV-2/FLU/RSV testing.  Fact Sheet for Patients: BloggerCourse.com  Fact Sheet for Healthcare Providers: SeriousBroker.it  This test is not yet approved or cleared by the [place] FDA and has been authorized for detection and/or diagnosis of SARS-CoV-2 by FDA under an Emergency Use Authorization (EUA). This EUA will remain in effect (meaning this test can be used) for the duration of the COVID-19 declaration under Section 564(b)(1) of the Act, 21 U.S.C. section 360bbb-3(b)(1), unless the authorization is terminated or revoked.  Performed at [facility], [address], [place], [place] [address]   Surgical pcr screen     Status: None   Collection Time: 10/29/20   8:47 AM   Specimen: Nasal Mucosa; Nasal Swab  Result Value Ref Range Status   MRSA, PCR NEGATIVE NEGATIVE Final   Staphylococcus aureus NEGATIVE NEGATIVE Final    Comment: (NOTE) The Xpert SA Assay (FDA approved for NASAL specimens in patients 76 years of age and older), is one component of a comprehensive surveillance program. It is not intended to diagnose infection nor to guide or monitor treatment. Performed at [facility], [address], [place], [place] [address]      Radiology Studies: CT Head Wo Contrast  Result Date: 10/28/2020 CLINICAL DATA:  Altered mental status. EXAM: CT HEAD WITHOUT CONTRAST TECHNIQUE: Contiguous axial images were obtained from the base of the skull through the vertex without intravenous contrast. COMPARISON:  April 02, 2020 FINDINGS: Brain: There is mild cerebral atrophy with widening of the extra-axial spaces and ventricular dilatation. There are areas of decreased attenuation within the white matter tracts of the supratentorial brain, consistent with microvascular disease changes. Vascular: No hyperdense vessel or unexpected calcification. Skull: Normal. Negative for fracture or focal lesion. Sinuses/Orbits: No acute finding. Other: None. IMPRESSION: No acute intracranial pathology. Electronically Signed   By: Aram Candela M.D.   On: 10/28/2020 23:27   Chest Portable 1 View  Result Date: 10/29/2020 CLINICAL DATA:  Preoperative evaluation. EXAM: PORTABLE CHEST 1 VIEW COMPARISON:  April 02, 2020 FINDINGS: The heart size and mediastinal contours are within normal limits. Both lungs are clear. The visualized skeletal structures are unremarkable. IMPRESSION: No active disease. Electronically Signed   By: Aram Candela M.D.   On: 10/29/2020 00:22   DG C-Arm 1-60 Min  Result Date: 10/29/2020 CLINICAL DATA:  RIGHT femoral fracture, nailing EXAM: DG C-ARM 1-60 MIN; RIGHT FEMUR 2 VIEWS CONTRAST:  None FLUOROSCOPY TIME:  Fluoroscopy Time:  0 minutes 53.3  seconds Radiation Exposure Index (if provided by the fluoroscopic device): 7.01 mGy Number of Acquired Spot Images: 7 COMPARISON:  10/28/2020 FINDINGS: Images demonstrate placement of an IM nail with compression screw and locking screw at the proximal RIGHT femur post ORIF of an intertrochanteric fracture. Bones demineralized. No dislocation. IMPRESSION: Post ORIF of intertrochanteric fracture RIGHT hip. Electronically Signed   By: Ulyses Southward M.D.   On: 10/29/2020 16:57   DG Hip Unilat W or Wo Pelvis 2-3 Views Right  Result Date: 10/28/2020 CLINICAL DATA:  Pain EXAM: DG HIP (WITH OR WITHOUT PELVIS) 2-3V RIGHT COMPARISON:  None. FINDINGS: There are lucencies coursing through the greater trochanter an intratrochanteric region, consistent with an acute, minimally displaced intratrochanteric fracture of the proximal right femur. There is osteopenia.  There are moderate degenerative changes of both hips. IMPRESSION: Acute, minimally displaced intratrochanteric fracture of the proximal right femur. Electronically Signed   By: Katherine Mantle M.D.   On: 10/28/2020 20:38   DG FEMUR, MIN 2 VIEWS RIGHT  Result Date: 10/29/2020 CLINICAL DATA:  RIGHT femoral fracture, nailing EXAM: DG C-ARM 1-60 MIN; RIGHT FEMUR 2 VIEWS CONTRAST:  None FLUOROSCOPY TIME:  Fluoroscopy Time:  0 minutes 53.3 seconds Radiation Exposure Index (if provided by the fluoroscopic device): 7.01 mGy Number of Acquired Spot Images: 7 COMPARISON:  10/28/2020 FINDINGS: Images demonstrate placement of an IM nail with compression screw and locking screw at the proximal RIGHT femur post ORIF of an intertrochanteric fracture. Bones demineralized. No dislocation. IMPRESSION: Post ORIF of intertrochanteric fracture RIGHT hip. Electronically Signed   By: Ulyses Southward M.D.   On: 10/29/2020 16:57    Scheduled Meds: . vitamin C  1,000 mg Per Tube Daily  . aspirin EC  325 mg Oral Q breakfast  . atorvastatin  40 mg Per Tube QHS  . buPROPion  150 mg Oral  Daily  . cholecalciferol  2,000 Units Per Tube Daily  . docusate sodium  100 mg Oral BID  . escitalopram  10 mg Per Tube Daily  . feeding supplement (PROSource TF)  45 mL Per Tube TID  . folic acid  1 mg Per Tube Daily  . free water  200 mL Per Tube Q8H  . heparin  5,000 Units Subcutaneous Q8H  . levETIRAcetam  500 mg Per Tube BID  . loratadine  10 mg Per Tube Daily  . losartan  100 mg Per Tube Daily  . magnesium oxide  200 mg Per Tube Daily  . melatonin  5 mg Per Tube QHS  . mirtazapine  15 mg Per Tube QHS  . mometasone-formoterol  2 puff Inhalation BID  . montelukast  10 mg Per Tube QHS  . pantoprazole  40 mg Oral Daily  . polyethylene glycol  17 g Per Tube QHS  . risperiDONE  1 mg Per Tube QHS  . thiamine  100 mg Per Tube Daily  . zinc sulfate  220 mg Oral Daily   Continuous Infusions: . feeding supplement (OSMOLITE 1.5 CAL) 45 mL/hr at 10/29/20 2100     LOS: 2 days   Time spent: 35 mins   Erlin Gardella Laural Benes, MD How to contact the [facility] Attending or Consulting provider 7A - 7P or covering provider during after hours 7P -7A, for this patient?  1. Check the care team in [facility] and look for a) attending/consulting [facility] provider listed and b) the [facility] team listed 2. Log into [email or web address] and use [place]'s universal password to access. If you do not have the password, please contact the hospital operator. 3. Locate the [facility] provider you are looking for under [facility] and page to a number that you can be directly reached. 4. If you still have difficulty reaching the provider, please page the [facility] (Director on Call) for the Hospitalists listed on amion for assistance.  10/30/2020, 2:32 PM

## 2020-10-30 NOTE — Plan of Care (Signed)
Patient is s/p right IM nailing by Dr. Aundria Rud. Dsg to right hip clean dry and intact with ice applied. Patient is in BL wrist restraints due to impulsiveness and agitation; BL mittens also. Q2H restraint checks per protocol. Patient has been resting majority of the shift. Tube feedings to G tube are WNL with Osmolite 1.5 formula at 45 cc/hr. Will continue to monitor and continue current POC.

## 2020-10-30 NOTE — Anesthesia Postprocedure Evaluation (Signed)
Anesthesia Post Note  Patient: Tamara [name]  Procedure(s) Performed: INTRAMEDULLARY (IM) NAIL INTERTROCHANTRIC (Right Leg Upper)     Patient location during evaluation: PACU Anesthesia Type: General Level of consciousness: awake and alert and oriented Pain management: pain level controlled Vital Signs Assessment: post-procedure vital signs reviewed and stable Respiratory status: spontaneous breathing, nonlabored ventilation and respiratory function stable Cardiovascular status: blood pressure returned to baseline Postop Assessment: no apparent nausea or vomiting Anesthetic complications: no   No complications documented.              Kaylyn Layer

## 2020-10-30 NOTE — Progress Notes (Signed)
Orthopedic Tech Progress Note Patient Details:  Tamara [name] 07/08/1961 [id number] RN called requesting another ABDOMINAL BINDER. Tube feed got all over the other one Ortho Devices Type of Ortho Device: Abdominal binder Ortho Device/Splint Location: STOMACH Ortho Device/Splint Interventions: Other (comment)   Post Interventions Patient Tolerated: Well Instructions Provided: Care of device   Donald Pore 10/30/2020, 1:49 PM

## 2020-10-31 LAB — COMPREHENSIVE METABOLIC PANEL
ALT: 20 U/L (ref 0–44)
AST: 14 U/L — ABNORMAL LOW (ref 15–41)
Albumin: 2.5 g/dL — ABNORMAL LOW (ref 3.5–5.0)
Alkaline Phosphatase: 65 U/L (ref 38–126)
Anion gap: 7 (ref 5–15)
BUN: 19 mg/dL (ref 6–20)
CO2: 25 mmol/L (ref 22–32)
Calcium: 8.7 mg/dL — ABNORMAL LOW (ref 8.9–10.3)
Chloride: 101 mmol/L (ref 98–111)
Creatinine, Ser: 0.77 mg/dL (ref 0.44–1.00)
GFR, Estimated: >60 mL/min (ref >60)
Glucose, Bld: 120 mg/dL — ABNORMAL HIGH (ref 70–99)
Potassium: 3.7 mmol/L (ref 3.5–5.1)
Sodium: 133 mmol/L — ABNORMAL LOW (ref 135–145)
Total Bilirubin: 0.2 mg/dL — ABNORMAL LOW (ref 0.3–1.2)
Total Protein: 5.8 g/dL — ABNORMAL LOW (ref 6.5–8.1)

## 2020-10-31 LAB — MAGNESIUM: Magnesium: 2.2 mg/dL (ref 1.7–2.4)

## 2020-10-31 LAB — PHOSPHORUS: Phosphorus: 4 mg/dL (ref 2.5–4.6)

## 2020-10-31 LAB — CBC WITH DIFFERENTIAL/PLATELET
Abs Immature Granulocytes: 0.04 10*3/uL (ref 0.00–0.07)
Basophils Absolute: 0 10*3/uL (ref 0.0–0.1)
Basophils Relative: 0 %
Eosinophils Absolute: 0.2 10*3/uL (ref 0.0–0.5)
Eosinophils Relative: 2 %
HCT: 25.6 % — ABNORMAL LOW (ref 36.0–46.0)
Hemoglobin: 8.7 g/dL — ABNORMAL LOW (ref 12.0–15.0)
Immature Granulocytes: 0 %
Lymphocytes Relative: 18 %
Lymphs Abs: 1.8 10*3/uL (ref 0.7–4.0)
MCH: 30.6 pg (ref 26.0–34.0)
MCHC: 34 g/dL (ref 30.0–36.0)
MCV: 90.1 fL (ref 80.0–100.0)
Monocytes Absolute: 1 10*3/uL (ref 0.1–1.0)
Monocytes Relative: 10 %
Neutro Abs: 7 10*3/uL (ref 1.7–7.7)
Neutrophils Relative %: 70 %
Platelets: 384 10*3/uL (ref 150–400)
RBC: 2.84 MIL/uL — ABNORMAL LOW (ref 3.87–5.11)
RDW: 13.4 % (ref 11.5–15.5)
WBC: 10 10*3/uL (ref 4.0–10.5)
nRBC: 0 % (ref 0.0–0.2)

## 2020-10-31 LAB — GLUCOSE, CAPILLARY
Glucose-Capillary: 108 mg/dL — ABNORMAL HIGH (ref 70–99)
Glucose-Capillary: 120 mg/dL — ABNORMAL HIGH (ref 70–99)
Glucose-Capillary: 120 mg/dL — ABNORMAL HIGH (ref 70–99)
Glucose-Capillary: 124 mg/dL — ABNORMAL HIGH (ref 70–99)
Glucose-Capillary: 134 mg/dL — ABNORMAL HIGH (ref 70–99)
Glucose-Capillary: 92 mg/dL (ref 70–99)

## 2020-10-31 NOTE — [place] FL2 (Signed)
[place] MEDICAID FL2 LEVEL OF CARE SCREENING TOOL     IDENTIFICATION  Patient Name: Tamara [name] Birthdate: 1960/10/10 Sex: female Admission Date (Current Location): 10/28/2020  [facility] and [place] Number:  Producer, television/film/video and Address:  [facility] [place], [address], [place], [place] [address]      Provider Number: [id number]  Attending Physician Name and Address:  Cleora Fleet, MD  Relative Name and Phone Number:  Shamar    Current Level of Care: Hospital Recommended Level of Care: Skilled Nursing Facility Prior Approval Number:    Date Approved/Denied:   PASRR Number: [id number]  Discharge Plan: SNF    Current Diagnoses: Patient Active Problem List   Diagnosis Date Noted  . Schizoaffective disorder (HCC)   . Cognitive communication deficit   . Intertrochanteric fracture of right femur, closed, initial encounter (HCC) 10/28/2020  . Normocytic anemia 10/28/2020  . Moderate protein malnutrition (HCC) 10/28/2020  . Protein-calorie malnutrition, severe 04/05/2020  . AKI (acute kidney injury) (HCC) 04/02/2020  . Acute metabolic encephalopathy 04/02/2020  . Acute encephalopathy 03/01/2020  . Asthma 03/01/2020  . Dehydration 03/01/2020  . Acute kidney injury superimposed on CKD (HCC) 03/01/2020  . Hyperammonemia (HCC) 03/01/2020  . Elevated serum hCG 03/01/2020  . Hypermagnesemia 03/01/2020  . Fall 03/01/2020  . Vomiting 03/01/2020  . Essential hypertension 03/01/2020  . AMS (altered mental status) 03/01/2020  . Chest pain 11/03/2018  . Asthma with acute exacerbation 01/14/2013  . Type 2 diabetes mellitus (HCC) 01/14/2013  . Dyslipidemia 01/14/2013    Orientation RESPIRATION BLADDER Height & Weight      (Memory impairment disoriented x4)  Normal Incontinent Weight: 95 lb 14.4 oz (43.5 kg) Height:  5\' 7"  (170.2 cm)  BEHAVIORAL SYMPTOMS/MOOD NEUROLOGICAL BOWEL NUTRITION STATUS      Continent (WDL, Gastrostomy/Enterostomy 20  Fr. LUQ) Diet (See discharge summary)  AMBULATORY STATUS COMMUNICATION OF NEEDS Skin   Extensive Assist Verbally Other (Comment) (Incision (closed) thigh Right, Foam-lift dressing, to assess site every shift, clean,dry,intact,dressing in place)                       Personal Care Assistance Level of Assistance  Bathing,Feeding,Dressing Bathing Assistance: Maximum assistance Feeding assistance: Maximum assistance (tube feeding NPO) Dressing Assistance: Maximum assistance     Functional Limitations Info  Sight,Hearing,Speech Sight Info: Adequate (WDL) Hearing Info: Adequate (WDL) Speech Info: Adequate (WDL)    SPECIAL CARE FACTORS FREQUENCY  PT (By licensed PT),OT (By licensed OT)     PT Frequency: 5x min weekly OT Frequency: 5x min weekly            Contractures Contractures Info: Not present    Additional Factors Info  Code Status,Allergies,Psychotropic Code Status Info: FULL Allergies Info: Chlorhexidine Psychotropic Info: buPROPion (WELLBUTRIN SR) 12 hr tablet 150 mg daily,escitalopram (LEXAPRO) tablet 10 mg daily,mirtazapine (REMERON) tablet 15 mg daily at bedtime,risperiDONE (RISPERDAL) tablet 1 mg daily at bedtime,         Current Medications (10/31/2020):  This is the current hospital active medication list Current Facility-Administered Medications  Medication Dose Route Frequency Provider Last Rate Last Admin  . acetaminophen (TYLENOL) tablet 650 mg  650 mg Oral Q6H PRN 12/31/2020, MD       Or  . acetaminophen (TYLENOL) suppository 650 mg  650 mg Rectal Q6H PRN Yolonda Kida, MD      . albuterol (VENTOLIN HFA) 108 (90 Base) MCG/ACT inhaler 2 puff  2 puff  Inhalation Q6H PRN Yolonda Kida, MD      . ascorbic acid (VITAMIN C) tablet 1,000 mg  1,000 mg Per Tube Daily Yolonda Kida, MD   1,000 mg at 10/31/20 7939  . aspirin EC tablet 325 mg  325 mg Oral Q breakfast Yolonda Kida, MD   325 mg at 10/31/20 0300  .  atorvastatin (LIPITOR) tablet 40 mg  40 mg Per Tube QHS Yolonda Kida, MD   40 mg at 10/30/20 2151  . buPROPion [facility] SR) 12 hr tablet 150 mg  150 mg Oral Daily Yolonda Kida, MD   150 mg at 10/31/20 9233  . cholecalciferol (VITAMIN D3) tablet 2,000 Units  2,000 Units Per Tube Daily Yolonda Kida, MD   2,000 Units at 10/31/20 [phone]  . docusate sodium (COLACE) capsule 100 mg  100 mg Oral BID Yolonda Kida, MD   100 mg at 10/31/20 2263  . escitalopram (LEXAPRO) tablet 10 mg  10 mg Per Tube Daily Yolonda Kida, MD   10 mg at 10/31/20 3354  . feeding supplement (OSMOLITE 1.5 CAL) liquid 1,000 mL  1,000 mL Per Tube Continuous Yolonda Kida, MD 45 mL/hr at 10/30/20 1758 1,000 mL at 10/30/20 1758  . feeding supplement (PROSource TF) liquid 45 mL  45 mL Per Tube TID Yolonda Kida, MD   45 mL at 10/31/20 5625  . folic acid (FOLVITE) tablet 1 mg  1 mg Per Tube Daily Yolonda Kida, MD   1 mg at 10/31/20 6389  . free water 200 mL  200 mL Per Tube Q8H Yolonda Kida, MD   200 mL at 10/31/20 0454  . haloperidol lactate (HALDOL) injection 1 mg  1 mg Intravenous Q6H PRN Yolonda Kida, MD   1 mg at 10/31/20 0454  . heparin injection 5,000 Units  5,000 Units Subcutaneous Q8H Yolonda Kida, MD   5,000 Units at 10/31/20 [phone]  . HYDROcodone-acetaminophen (NORCO/VICODIN) 5-325 MG per tablet 1 tablet  1 tablet Oral Q6H PRN Yolonda Kida, MD   1 tablet at 10/30/20 2151  . levETIRAcetam (KEPPRA) 100 MG/ML solution 500 mg  500 mg Per Tube BID Yolonda Kida, MD   500 mg at 10/31/20 2876  . loratadine (CLARITIN) tablet 10 mg  10 mg Per Tube Daily Yolonda Kida, MD   10 mg at 10/31/20 8115  . losartan (COZAAR) tablet 100 mg  100 mg Per Tube Daily Yolonda Kida, MD   100 mg at 10/31/20 0920  . magnesium oxide (MAG-OX) tablet 200 mg  200 mg Per Tube Daily Yolonda Kida, MD   200 mg at 10/31/20 7262  .  melatonin tablet 5 mg  5 mg Per Tube QHS Yolonda Kida, MD   5 mg at 10/30/20 2151  . menthol-cetylpyridinium (CEPACOL) lozenge 3 mg  1 lozenge Oral PRN Yolonda Kida, MD       Or  . phenol [facility]) mouth spray 1 spray  1 spray Mouth/Throat PRN Yolonda Kida, MD      . metoCLOPramide [facility]) tablet 5-10 mg  5-10 mg Oral Q8H PRN Yolonda Kida, MD       Or  . metoCLOPramide [facility]) injection 5-10 mg  5-10 mg Intravenous Q8H PRN Yolonda Kida, MD      . mirtazapine (REMERON) tablet 15 mg  15 mg Per Tube QHS Yolonda Kida, MD   15 mg at 10/30/20 2151  .  mometasone-formoterol (DULERA) 200-5 MCG/ACT inhaler 2 puff  2 puff Inhalation BID Yolonda Kida, MD   2 puff at 10/31/20 0840  . montelukast (SINGULAIR) tablet 10 mg  10 mg Per Tube QHS Yolonda Kida, MD   10 mg at 10/30/20 2151  . morphine 2 MG/ML injection 2 mg  2 mg Intravenous Q2H PRN Yolonda Kida, MD      . ondansetron [facility]) tablet 4 mg  4 mg Oral Q6H PRN Yolonda Kida, MD       Or  . ondansetron [facility]) injection 4 mg  4 mg Intravenous Q6H PRN Yolonda Kida, MD      . pantoprazole (PROTONIX) EC tablet 40 mg  40 mg Oral Daily Yolonda Kida, MD   40 mg at 10/31/20 5809  . polyethylene glycol (MIRALAX / GLYCOLAX) packet 17 g  17 g Per Tube QHS Yolonda Kida, MD   17 g at 10/30/20 2152  . risperiDONE (RISPERDAL) tablet 1 mg  1 mg Per Tube QHS Yolonda Kida, MD   1 mg at 10/30/20 2150  . thiamine tablet 100 mg  100 mg Per Tube Daily Yolonda Kida, MD   100 mg at 10/31/20 9833  . zinc sulfate capsule 220 mg  220 mg Oral Daily Yolonda Kida, MD   220 mg at 10/31/20 8250     Discharge Medications: Please see discharge summary for a list of discharge medications.  Relevant Imaging Results:  Relevant Lab Results:   Additional Information [id number]  Terrial Rhodes, LCSWA

## 2020-10-31 NOTE — Progress Notes (Signed)
PROGRESS NOTE   Tamara [name]  RDE:[id number] DOB: [birth year] DOA: 10/28/2020 PCP: [name], NP   Chief Complaint  Patient presents with  . abnormal hip x-ray   Level of care: Med-Surg  Brief Admission History:  60 y.o. female with medical history significant of asthma, cognitive communication deficit, memory changes, schizoaffective disorder, history of metabolic encephalopathy, seizure disorder, history of COVID-19, type II DM, dysphagia, hypertension, major depressive disorder, severe protein calorie nutrition who was brought via EMS from [place] due to right hip x-ray from 10/22/2020 suspicious for a nondisplaced femoral neck fracture.  Patient is usually mobile in a wheelchair, but the facility staff stated that she has not been mobile over the last few days.  She is unable to provide further information at this time.  ED Course: Initial vital signs were temperature 99.4 F, pulse 79, respiration 15, BP 116/72 mmHg and O2 sat 98% on room air  Assessment & Plan:   Principal Problem:   Intertrochanteric fracture of right femur, closed, initial encounter (HCC) Active Problems:   Type 2 diabetes mellitus (HCC)   Dyslipidemia   Asthma   Essential hypertension   Protein-calorie malnutrition, severe   Normocytic anemia   Moderate protein malnutrition (HCC)   Schizoaffective disorder (HCC)   Cognitive communication deficit  1. Acute right intertrochanteric hip fracture - Ortho consulted and now post op day#2 s/p IM nail intertrochanteric right -  Analgesics as ordered. PT recommending SNF.  Hg down to 8.7.  2. Dyslipidemia -resume home atorvastatin.  3. Type 2 diabetes mellitus - SSI coverage ordered and CBG testing. BS well controlled.  4. Schizoaffective Disorder - Pt has had some mild increased agitation in setting of acute fracture, IV haldol ordered as needed. Resume home risperdal daily.   5. Essential hypertension - resumed home med.  6. Normocytic anemia -  Monitoring closely.  7. Severe protein calorie malnutrition - Dietitian consulted to restart tube feeding after surgery.  Monitoring Mg, Phos for signs of refeeding syndrome.  8. Hypomagnesemia - IV replacement ordered and repleted.      DVT prophylaxis:  SQ hep Code Status: full  Family Communication: t/c daughter 3/6 Disposition: SNF  Status is: Inpatient  Remains inpatient appropriate because:IV treatments appropriate due to intensity of illness or inability to take PO and Inpatient level of care appropriate due to severity of illness  Dispo: The patient is from: SNF              Anticipated d/c is to: SNF              Patient currently is not medically stable to d/c.   Difficult to place patient No  Consultants:   orthopedics  Procedures:     Antimicrobials:    Subjective: Pt has no specific complaints at this time.      Objective: Vitals:   10/31/20 0014 10/31/20 0400 10/31/20 0500 10/31/20 0840  BP: (!) 97/51 137/72    Pulse: 75 70  82  Resp: 14 14  16   Temp: (!) 97 F (36.1 C) 97.9 F (36.6 C)    TempSrc:  Axillary    SpO2: 98% 99%  97%  Weight:   43.5 kg   Height:       No intake or output data in the 24 hours ending 10/31/20 1256 Filed Weights   10/28/20 1836 10/30/20 0500 10/31/20 0500  Weight: 58.2 kg 48.5 kg 43.5 kg   Examination:  General exam: Appears calm and comfortable. No  acute distress.     Respiratory system: Clear to auscultation. Respiratory effort normal. Cardiovascular system: normal S1 & S2 heard. No JVD, murmurs, rubs, gallops or clicks. No pedal edema. Gastrointestinal system: Abdomen is nondistended, soft and nontender. No organomegaly or masses felt. Normal bowel sounds heard. Central nervous system: Alert and oriented. No focal neurological deficits. Extremities: dressings clean and dry.  no bruising seen LLE or edema.  Skin: No rashes, lesions or ulcers Psychiatry: Judgement and insight poor. Mood & affect appropriate.   Data  Reviewed: I have personally reviewed following labs and imaging studies  CBC: Recent Labs  Lab 10/28/20 2129 10/30/20 0536 10/31/20 0107  WBC 10.1 10.2 10.0  NEUTROABS 6.2 7.4 7.0  HGB 9.1* 9.6* 8.7*  HCT 28.5* 29.2* 25.6*  MCV 93.4 90.7 90.1  PLT 379 386 384    Basic Metabolic Panel: Recent Labs  Lab 10/28/20 2208 10/29/20 1929 10/30/20 0536 10/31/20 0107  NA 138  --  133* 133*  K 3.8  --  3.8 3.7  CL 104  --  100 101  CO2 25  --  23 25  GLUCOSE 128*  --  147* 120*  BUN 24*  --  15 19  CREATININE 0.92  --  0.67 0.77  CALCIUM 9.6  --  9.0 8.7*  MG 1.8 1.5* 1.5* 2.2  PHOS 3.7 3.3 2.8 4.0    GFR: Estimated Creatinine Clearance: 52.6 mL/min (by C-G formula based on SCr of 0.77 mg/dL).  Liver Function Tests: Recent Labs  Lab 10/28/20 2208 10/30/20 0536 10/31/20 0107  AST 25 22 14*  ALT 52* 33 20  ALKPHOS 71 74 65  BILITOT 0.5 0.5 0.2*  PROT 6.6 6.4* 5.8*  ALBUMIN 2.9* 2.8* 2.5*    CBG: Recent Labs  Lab 10/30/20 1801 10/30/20 1949 10/31/20 0013 10/31/20 0404 10/31/20 0810  GLUCAP 151* 151* 120* 108* 134*    Recent Results (from the past 240 hour(s))  Resp Panel by RT-PCR (Flu A&B, Covid) Nasopharyngeal Swab     Status: None   Collection Time: 10/28/20 10:00 PM   Specimen: Nasopharyngeal Swab; Nasopharyngeal(NP) swabs in vial transport medium  Result Value Ref Range Status   SARS Coronavirus 2 by RT PCR NEGATIVE NEGATIVE Final    Comment: (NOTE) SARS-CoV-2 target nucleic acids are NOT DETECTED.  The SARS-CoV-2 RNA is generally detectable in upper respiratory specimens during the acute phase of infection. The lowest concentration of SARS-CoV-2 viral copies this assay can detect is 138 copies/mL. A negative result does not preclude SARS-Cov-2 infection and should not be used as the sole basis for treatment or other patient management decisions. A negative result may occur with  improper specimen collection/handling, submission of specimen  other than nasopharyngeal swab, presence of viral mutation(s) within the areas targeted by this assay, and inadequate number of viral copies(<138 copies/mL). A negative result must be combined with clinical observations, patient history, and epidemiological information. The expected result is Negative.  Fact Sheet for Patients:  BloggerCourse.com  Fact Sheet for Healthcare Providers:  SeriousBroker.it  This test is no t yet approved or cleared by the [place] FDA and  has been authorized for detection and/or diagnosis of SARS-CoV-2 by FDA under an Emergency Use Authorization (EUA). This EUA will remain  in effect (meaning this test can be used) for the duration of the COVID-19 declaration under Section 564(b)(1) of the Act, 21 U.S.C.section 360bbb-3(b)(1), unless the authorization is terminated  or revoked sooner.       Influenza  A by PCR NEGATIVE NEGATIVE Final   Influenza B by PCR NEGATIVE NEGATIVE Final    Comment: (NOTE) The Xpert Xpress SARS-CoV-2/FLU/RSV plus assay is intended as an aid in the diagnosis of influenza from Nasopharyngeal swab specimens and should not be used as a sole basis for treatment. Nasal washings and aspirates are unacceptable for Xpert Xpress SARS-CoV-2/FLU/RSV testing.  Fact Sheet for Patients: BloggerCourse.comhttps://www.fda.gov/media/152166/download  Fact Sheet for Healthcare Providers: SeriousBroker.ithttps://www.fda.gov/media/152162/download  This test is not yet approved or cleared by the [place] FDA and has been authorized for detection and/or diagnosis of SARS-CoV-2 by FDA under an Emergency Use Authorization (EUA). This EUA will remain in effect (meaning this test can be used) for the duration of the COVID-19 declaration under Section 564(b)(1) of the Act, 21 U.S.C. section 360bbb-3(b)(1), unless the authorization is terminated or revoked.  Performed at [facility], [address] [place],  [place] [address]   Surgical pcr screen     Status: None   Collection Time: 10/29/20  8:47 AM   Specimen: Nasal Mucosa; Nasal Swab  Result Value Ref Range Status   MRSA, PCR NEGATIVE NEGATIVE Final   Staphylococcus aureus NEGATIVE NEGATIVE Final    Comment: (NOTE) The Xpert SA Assay (FDA approved for NASAL specimens in patients 10322 years of age and older), is one component of a comprehensive surveillance program. It is not intended to diagnose infection nor to guide or monitor treatment. Performed at [facility], [address] [place], [place] [address]      Radiology Studies: DG C-Arm 1-60 Min  Result Date: 10/29/2020 CLINICAL DATA:  RIGHT femoral fracture, nailing EXAM: DG C-ARM 1-60 MIN; RIGHT FEMUR 2 VIEWS CONTRAST:  None FLUOROSCOPY TIME:  Fluoroscopy Time:  0 minutes 53.3 seconds Radiation Exposure Index (if provided by the fluoroscopic device): 7.01 mGy Number of Acquired Spot Images: 7 COMPARISON:  10/28/2020 FINDINGS: Images demonstrate placement of an IM nail with compression screw and locking screw at the proximal RIGHT femur post ORIF of an intertrochanteric fracture. Bones demineralized. No dislocation. IMPRESSION: Post ORIF of intertrochanteric fracture RIGHT hip. Electronically Signed   By: Ulyses SouthwardMark  Boles M.D.   On: 10/29/2020 16:57   DG FEMUR, MIN 2 VIEWS RIGHT  Result Date: 10/29/2020 CLINICAL DATA:  RIGHT femoral fracture, nailing EXAM: DG C-ARM 1-60 MIN; RIGHT FEMUR 2 VIEWS CONTRAST:  None FLUOROSCOPY TIME:  Fluoroscopy Time:  0 minutes 53.3 seconds Radiation Exposure Index (if provided by the fluoroscopic device): 7.01 mGy Number of Acquired Spot Images: 7 COMPARISON:  10/28/2020 FINDINGS: Images demonstrate placement of an IM nail with compression screw and locking screw at the proximal RIGHT femur post ORIF of an intertrochanteric fracture. Bones demineralized. No dislocation. IMPRESSION: Post ORIF of intertrochanteric fracture RIGHT hip. Electronically Signed   By: Ulyses SouthwardMark   Boles M.D.   On: 10/29/2020 16:57    Scheduled Meds: . vitamin C  1,000 mg Per Tube Daily  . aspirin EC  325 mg Oral Q breakfast  . atorvastatin  40 mg Per Tube QHS  . buPROPion  150 mg Oral Daily  . cholecalciferol  2,000 Units Per Tube Daily  . docusate sodium  100 mg Oral BID  . escitalopram  10 mg Per Tube Daily  . feeding supplement (PROSource TF)  45 mL Per Tube TID  . folic acid  1 mg Per Tube Daily  . free water  200 mL Per Tube Q8H  . heparin  5,000 Units Subcutaneous Q8H  . levETIRAcetam  500 mg Per Tube BID  . loratadine  10 mg Per Tube Daily  . losartan  100 mg Per Tube Daily  . magnesium oxide  200 mg Per Tube Daily  . melatonin  5 mg Per Tube QHS  . mirtazapine  15 mg Per Tube QHS  . mometasone-formoterol  2 puff Inhalation BID  . montelukast  10 mg Per Tube QHS  . pantoprazole  40 mg Oral Daily  . polyethylene glycol  17 g Per Tube QHS  . risperiDONE  1 mg Per Tube QHS  . thiamine  100 mg Per Tube Daily  . zinc sulfate  220 mg Oral Daily   Continuous Infusions: . feeding supplement (OSMOLITE 1.5 CAL) 1,000 mL (10/30/20 1758)     LOS: 3 days   Time spent: 35 mins   Clanford Laural Benes, MD How to contact the [facility] Attending or Consulting provider 7A - 7P or covering provider during after hours 7P -7A, for this patient?  1. Check the care team in [facility] and look for a) attending/consulting TRH provider listed and b) the [facility] team listed 2. Log into [email or web address] and use [place]'s universal password to access. If you do not have the password, please contact the hospital operator. 3. Locate the [facility] provider you are looking for under [facility] and page to a number that you can be directly reached. 4. If you still have difficulty reaching the provider, please page the [facility] (Director on Call) for the Hospitalists listed on amion for assistance.  10/31/2020, 12:56 PM

## 2020-10-31 NOTE — Plan of Care (Signed)

## 2020-10-31 NOTE — Progress Notes (Signed)
Subjective: 2 Days Post-Op Procedure(s) (LRB): INTRAMEDULLARY (IM) NAIL INTERTROCHANTRIC (Right) Patient seen in rounds for Dr. Aundria Rud this am Patient reports pain as mild.   She is overall doing well this am She was limited with PT yesterday requiring max assist for most things Denies CP, SOB + void/ flatus  Objective: Vital signs in last 24 hours: Temp:  [97 F (36.1 C)-98.6 F (37 C)] 97.9 F (36.6 C) (03/06 0400) Pulse Rate:  [70-82] 70 (03/06 0400) Resp:  [14-18] 14 (03/06 0400) BP: (97-158)/(51-92) 137/72 (03/06 0400) SpO2:  [98 %-100 %] 99 % (03/06 0400) Weight:  [43.5 kg] 43.5 kg (03/06 0500)  Intake/Output from previous day: 03/05 0701 - 03/06 0700 In: 120 [P.O.:120] Out: 0  Intake/Output this shift: No intake/output data recorded.  Recent Labs    10/28/20 2129 10/30/20 0536 10/31/20 0107  HGB 9.1* 9.6* 8.7*   Recent Labs    10/30/20 0536 10/31/20 0107  WBC 10.2 10.0  RBC 3.22* 2.84*  HCT 29.2* 25.6*  PLT 386 384   Recent Labs    10/30/20 0536 10/31/20 0107  NA 133* 133*  K 3.8 3.7  CL 100 101  CO2 23 25  BUN 15 19  CREATININE 0.67 0.77  GLUCOSE 147* 120*  CALCIUM 9.0 8.7*   Recent Labs    10/28/20 2129  INR 1.1    Neurologically intact Neurovascular intact Sensation intact distally Intact pulses distally Dorsiflexion/Plantar flexion intact Incision: dressing C/D/I Compartment soft   Assessment/Plan: 2 Days Post-Op Procedure(s) (LRB): INTRAMEDULLARY (IM) NAIL INTERTROCHANTRIC (Right) Advance diet Up with therapy  Needs to continue to work with PT WBAT No changes per ortho ASA 81 mg daily for DVT prophylaxis    Patient's anticipated LOS is less than 2 midnights, meeting these requirements: - Younger than 64 - Lives within 1 hour of care - Has a competent adult at home to recover with post-op recover - NO history of  - Chronic pain requiring opiods  - Diabetes  - Coronary Artery Disease  - Heart failure  - Heart  attack  - Stroke  - DVT/VTE  - Cardiac arrhythmia  - Respiratory Failure/COPD  - Renal failure  - Anemia  - Advanced Liver disease       Ceasar Mons [phone] 10/31/2020, 7:36 AM

## 2020-10-31 NOTE — TOC Initial Note (Signed)
Transition of Care [facility]) - Initial/Assessment Note    Patient Details  Name: Tamara [name] MRN: [id number] Date of Birth: Jun 12, 1961  Transition of Care [facility]) CM/SW Contact:    Terrial Rhodes, LCSWA Phone Number: 10/31/2020, 12:32 PM  Clinical Narrative:                  CSW received consult for possible SNF placement at time of discharge. CSW spoke with patients daughter [name]  regarding PT recommendation of SNF placement at time of discharge.  Patients daughter expressed understanding of PT recommendation and is agreeable to SNF placement at time of discharge.Patients daughter reports patient comes from [place] short term. Patients daughter would like for CSW to fax out to other facility's for SNF placement. Patients daughter gave CSW permission to fax out initial referral near [place] area. Patient has received the COVID vaccines as well as booster.  No further questions reported at this time. CSW to continue to follow and assist with discharge planning needs.  Expected Discharge Plan: Skilled Nursing Facility Barriers to Discharge: Continued Medical Work up   Patient Goals and CMS Choice   CMS Medicare.gov Compare Post Acute Care list provided to:: Patient Represenative (must comment) (daughter [name]) Choice offered to / list presented to : Adult Children (Daughter Scientist, research (medical))  Expected Discharge Plan and Services Expected Discharge Plan: Skilled Nursing Facility In-house Referral: Clinical Social Work     Living arrangements for the past 2 months: Skilled Nursing Facility                                      Prior Living Arrangements/Services Living arrangements for the past 2 months: Skilled Nursing Facility Lives with:: Self,Facility Resident (Came from [place]) Patient language and need for interpreter reviewed:: Yes Do you feel safe going back to the place where you live?: No   SNF  Need for Family Participation in Patient Care:  Yes (Comment) Care giver support system in place?: Yes (comment)   Criminal Activity/Legal Involvement Pertinent to Current Situation/Hospitalization: No - Comment as needed  Activities of Daily Living Home Assistive Devices/Equipment: Other (Comment) (UTA) ADL Screening (condition at time of admission) Patient's cognitive ability adequate to safely complete daily activities?: No Is the patient deaf or have difficulty hearing?: No Does the patient have difficulty seeing, even when wearing glasses/contacts?: No Does the patient have difficulty concentrating, remembering, or making decisions?: Yes Patient able to express need for assistance with ADLs?: No Does the patient have difficulty dressing or bathing?: Yes Independently performs ADLs?: No Does the patient have difficulty walking or climbing stairs?: Yes Weakness of Legs: Both Weakness of Arms/Hands: None  Permission Sought/Granted Permission sought to share information with : Case Manager,Family Electrical engineer Permission granted to share information with : Yes, Verbal Permission Granted  Share Information with NAME: Shamar  Permission granted to share info w AGENCY: SNF  Permission granted to share info w Relationship: daughter  Permission granted to share info w Contact Information: [name] [phone]  Emotional Assessment       Orientation: :  (memory impairment) Alcohol / Substance Use: Not Applicable Psych Involvement: No (comment)  Admission diagnosis:  Encephalopathy chronic [G93.49] Preop cardiovascular exam [Z01.810] Closed fracture of right hip, initial encounter (HCC) [S72.001A] Intertrochanteric fracture of right femur, closed, initial encounter [facility]) [S72.141A] Patient Active Problem List   Diagnosis Date Noted  . Schizoaffective disorder (HCC)   .  Cognitive communication deficit   . Intertrochanteric fracture of right femur, closed, initial encounter (HCC) 10/28/2020  .  Normocytic anemia 10/28/2020  . Moderate protein malnutrition (HCC) 10/28/2020  . Protein-calorie malnutrition, severe 04/05/2020  . AKI (acute kidney injury) (HCC) 04/02/2020  . Acute metabolic encephalopathy 04/02/2020  . Acute encephalopathy 03/01/2020  . Asthma 03/01/2020  . Dehydration 03/01/2020  . Acute kidney injury superimposed on CKD (HCC) 03/01/2020  . Hyperammonemia (HCC) 03/01/2020  . Elevated serum hCG 03/01/2020  . Hypermagnesemia 03/01/2020  . Fall 03/01/2020  . Vomiting 03/01/2020  . Essential hypertension 03/01/2020  . AMS (altered mental status) 03/01/2020  . Chest pain 11/03/2018  . Asthma with acute exacerbation 01/14/2013  . Type 2 diabetes mellitus (HCC) 01/14/2013  . Dyslipidemia 01/14/2013   PCP:  [name], NP Pharmacy:   [facility] - [place], [place] - [address] [place] [address] Phone: [phone] Fax: [phone]  [facility] [place], [place] - [address] [place] [address] Phone: [phone] Fax: [phone]     Social Determinants of Health (SDOH) Interventions    Readmission Risk Interventions No flowsheet data found.

## 2020-11-01 ENCOUNTER — Encounter (HOSPITAL_COMMUNITY): Payer: Self-pay | Admitting: Orthopedic Surgery

## 2020-11-01 LAB — CBC WITH DIFFERENTIAL/PLATELET
Abs Immature Granulocytes: 0.03 10*3/uL (ref 0.00–0.07)
Basophils Absolute: 0 10*3/uL (ref 0.0–0.1)
Basophils Relative: 0 %
Eosinophils Absolute: 0.2 10*3/uL (ref 0.0–0.5)
Eosinophils Relative: 3 %
HCT: 26.7 % — ABNORMAL LOW (ref 36.0–46.0)
Hemoglobin: 8.6 g/dL — ABNORMAL LOW (ref 12.0–15.0)
Immature Granulocytes: 0 %
Lymphocytes Relative: 25 %
Lymphs Abs: 2.1 10*3/uL (ref 0.7–4.0)
MCH: 29.6 pg (ref 26.0–34.0)
MCHC: 32.2 g/dL (ref 30.0–36.0)
MCV: 91.8 fL (ref 80.0–100.0)
Monocytes Absolute: 0.7 10*3/uL (ref 0.1–1.0)
Monocytes Relative: 8 %
Neutro Abs: 5.4 10*3/uL (ref 1.7–7.7)
Neutrophils Relative %: 64 %
Platelets: 407 10*3/uL — ABNORMAL HIGH (ref 150–400)
RBC: 2.91 MIL/uL — ABNORMAL LOW (ref 3.87–5.11)
RDW: 13.5 % (ref 11.5–15.5)
WBC: 8.4 10*3/uL (ref 4.0–10.5)
nRBC: 0 % (ref 0.0–0.2)

## 2020-11-01 LAB — COMPREHENSIVE METABOLIC PANEL
ALT: 19 U/L (ref 0–44)
AST: 14 U/L — ABNORMAL LOW (ref 15–41)
Albumin: 2.5 g/dL — ABNORMAL LOW (ref 3.5–5.0)
Alkaline Phosphatase: 74 U/L (ref 38–126)
Anion gap: 9 (ref 5–15)
BUN: 20 mg/dL (ref 6–20)
CO2: 26 mmol/L (ref 22–32)
Calcium: 9.2 mg/dL (ref 8.9–10.3)
Chloride: 101 mmol/L (ref 98–111)
Creatinine, Ser: 0.74 mg/dL (ref 0.44–1.00)
GFR, Estimated: >60 mL/min (ref >60)
Glucose, Bld: 129 mg/dL — ABNORMAL HIGH (ref 70–99)
Potassium: 4 mmol/L (ref 3.5–5.1)
Sodium: 136 mmol/L (ref 135–145)
Total Bilirubin: 0.5 mg/dL (ref 0.3–1.2)
Total Protein: 6 g/dL — ABNORMAL LOW (ref 6.5–8.1)

## 2020-11-01 LAB — PHOSPHORUS: Phosphorus: 4 mg/dL (ref 2.5–4.6)

## 2020-11-01 LAB — GLUCOSE, CAPILLARY
Glucose-Capillary: 112 mg/dL — ABNORMAL HIGH (ref 70–99)
Glucose-Capillary: 130 mg/dL — ABNORMAL HIGH (ref 70–99)
Glucose-Capillary: 131 mg/dL — ABNORMAL HIGH (ref 70–99)
Glucose-Capillary: 143 mg/dL — ABNORMAL HIGH (ref 70–99)

## 2020-11-01 LAB — SARS CORONAVIRUS 2 (TAT 6-24 HRS): SARS Coronavirus 2: NEGATIVE

## 2020-11-01 LAB — MAGNESIUM: Magnesium: 1.9 mg/dL (ref 1.7–2.4)

## 2020-11-01 NOTE — Progress Notes (Signed)
Physical Therapy Treatment Patient Details Name: Tamara [name] MRN: [id number] DOB: April 22, 1961 Today's Date: 11/01/2020    History of Present Illness Patient is a 60 year old female S/P fall with right hip fx. She underwent IM nailing on 10/29/2020. Prior to fall she was transfering to a wheelchair at her facility. The patient is a limited histroian. PMH: Protien calorie malnutrition, seizures, schizoaffective disorder, renal disorder, DMII, cognative deficit    PT Comments    Pt more calm despite bilat wrist restraints and mittens but focused on being pregnant. Attempted to stand pt and transfer however pt retropulsive with significant posterior bias and attempting to return to bed. Pt continue to have decreased insight to situation and R hip fracture. Pt remains appropriate to return to SNF upon d/c.    Follow Up Recommendations  SNF     Equipment Recommendations   (TBD at next venue)    Recommendations for Other Services       Precautions / Restrictions Precautions Precautions: Fall Precaution Comments: pt in bilat wrist restraints and mittens, calling out and thinks she is pregnant Restrictions Weight Bearing Restrictions: No RLE Weight Bearing: Weight bearing as tolerated    Mobility  Bed Mobility Overal bed mobility: Needs Assistance Bed Mobility: Supine to Sit;Sit to Supine     Supine to sit: Mod assist;+2 for physical assistance Sit to supine: Max assist;+2 for physical assistance   General bed mobility comments: pt did pull up on bed rail to raise trunk, modA for R LE mangement    Transfers Overall transfer level: Needs assistance Equipment used: Rolling walker (2 wheeled) Transfers: Sit to/from Stand Sit to Stand: Max assist;+2 physical assistance;+2 safety/equipment         General transfer comment: maxAx2 to stand, pt with posterior bias and hip flexion, despite max tactile cues at hips, unable to tolerate or achieve full upright standing, attempted to  weight shift L/R however pt unable to follow commands and was trying to return to sitting  Ambulation/Gait             General Gait Details: unable   Stairs             Wheelchair Mobility    Modified Rankin (Stroke Patients Only)       Balance Overall balance assessment: Needs assistance Sitting-balance support: Feet supported;No upper extremity supported Sitting balance-Leahy Scale: Fair     Standing balance support: Bilateral upper extremity supported Standing balance-Leahy Scale: Poor Standing balance comment: dependent on RW and external assist                            Cognition Arousal/Alertness: Awake/alert Behavior During Therapy: Restless Overall Cognitive Status: History of cognitive impairments - at baseline                                 General Comments: pt with delayed response time, inconsistent command follow, decreased insight to safety and deficits, unaware she broke her hip, not oriented      Exercises      General Comments General comments (skin integrity, edema, etc.): pt's hip incisions covererd by dressings      Pertinent Vitals/Pain Pain Assessment: Faces Faces Pain Scale: Hurts a little bit Pain Location: R hip with movement Pain Descriptors / Indicators: Grimacing;Guarding Pain Intervention(s): Limited activity within patient's tolerance    Home Living  Prior Function            PT Goals (current goals can now be found in the care plan section) Acute Rehab PT Goals PT Goal Formulation: With patient Time For Goal Achievement: 11/15/20 Potential to Achieve Goals: Good Progress towards PT goals: Progressing toward goals    Frequency    Min 2X/week      PT Plan Current plan remains appropriate    Co-evaluation              AM-PAC PT "6 Clicks" Mobility   Outcome Measure  Help needed turning from your back to your side while in a flat bed without  using bedrails?: A Lot Help needed moving from lying on your back to sitting on the side of a flat bed without using bedrails?: A Lot Help needed moving to and from a bed to a chair (including a wheelchair)?: A Lot Help needed standing up from a chair using your arms (e.g., wheelchair or bedside chair)?: A Lot Help needed to walk in hospital room?: Total Help needed climbing 3-5 steps with a railing? : Total 6 Click Score: 10    End of Session Equipment Utilized During Treatment: Gait belt Activity Tolerance: Patient tolerated treatment well Patient left: in bed;with call bell/phone within reach;with chair alarm set;with restraints reapplied Nurse Communication: Mobility status PT Visit Diagnosis: Unsteadiness on feet (R26.81);Repeated falls (R29.6);Muscle weakness (generalized) (M62.81);Difficulty in walking, not elsewhere classified (R26.2);Pain Pain - Right/Left: Right Pain - part of body: Hip     Time: 1205-1226 PT Time Calculation (min) (ACUTE ONLY): 21 min  Charges:  $Therapeutic Activity: 8-22 mins                     Lewis Shock, PT, DPT Acute Rehabilitation Services Pager #: [phone] Office #: [phone]    Tamara [name] 11/01/2020, 1:53 PM

## 2020-11-01 NOTE — Progress Notes (Signed)
Soft restrained BLE wrist discontinued , mittens on BLE, will continue to monitor patient.

## 2020-11-01 NOTE — TOC Progression Note (Signed)
Transition of Care [facility]) - Progression Note    Patient Details  Name: Tamara [name] MRN: [id number] Date of Birth: March 26, 1961  Transition of Care [facility]) CM/SW Contact  Carley Hammed, [place] Phone Number: 11/01/2020, 11:59 AM  Clinical Narrative:    CSW spoke with pt's dtr and informed her that there have been no additional bed offers, but [place] would accept her back. She is in agreement to this plan. A covid test has been requested and Transport papers are on the chart. SW will continue to follow for DC planning.   Expected Discharge Plan: Skilled Nursing Facility Barriers to Discharge: Continued Medical Work up  Expected Discharge Plan and Services Expected Discharge Plan: Skilled Nursing Facility In-house Referral: Clinical Social Work     Living arrangements for the past 2 months: Skilled Nursing Facility                                       Social Determinants of Health (SDOH) Interventions    Readmission Risk Interventions No flowsheet data found.

## 2020-11-01 NOTE — Progress Notes (Signed)
PROGRESS NOTE   Tamara [name]  BTD:[id number] DOB: 10-06-1960 DOA: 10/28/2020 PCP: [name], NP   Chief Complaint  Patient presents with  . abnormal hip x-ray   Level of care: Med-Surg  Brief Admission History:  60 y.o. female with medical history significant of asthma, cognitive communication deficit, memory changes, schizoaffective disorder, history of metabolic encephalopathy, seizure disorder, history of COVID-19, type II DM, dysphagia, hypertension, major depressive disorder, severe protein calorie nutrition who was brought via EMS from [place] due to right hip x-ray from 10/22/2020 suspicious for a nondisplaced femoral neck fracture.  Patient is usually mobile in a wheelchair, but the facility staff stated that she has not been mobile over the last few days.  She is unable to provide further information at this time.  ED Course: Initial vital signs were temperature 99.4 F, pulse 79, respiration 15, BP 116/72 mmHg and O2 sat 98% on room air  Assessment & Plan:   Principal Problem:   Intertrochanteric fracture of right femur, closed, initial encounter (HCC) Active Problems:   Type 2 diabetes mellitus (HCC)   Dyslipidemia   Asthma   Essential hypertension   Protein-calorie malnutrition, severe   Normocytic anemia   Moderate protein malnutrition (HCC)   Schizoaffective disorder (HCC)   Cognitive communication deficit  1. Acute right intertrochanteric hip fracture - Ortho consulted and now post op day#3 s/p IM nail intertrochanteric right -  Analgesics as ordered. PT recommending SNF.  Hg down to 8.6.  2. Dyslipidemia -resume home atorvastatin.  3. Type 2 diabetes mellitus - SSI coverage ordered and CBG testing. BS well controlled.  4. Schizoaffective Disorder - Resumed home risperdal daily.   5. Essential hypertension - resumed home med.  6. Normocytic anemia - Monitoring closely.  7. Severe protein calorie malnutrition - Dietitian consulted to restart tube  feeding after surgery.  Monitoring Mg, Phos for signs of refeeding syndrome.  8. Hypomagnesemia - IV replacement ordered and repleted.      DVT prophylaxis:  SQ hep Code Status: full  Family Communication: t/c daughter 3/6 Disposition: SNF  Status is: Inpatient  Remains inpatient appropriate because:IV treatments appropriate due to intensity of illness or inability to take PO and Inpatient level of care appropriate due to severity of illness  Dispo: The patient is from: SNF              Anticipated d/c is to: SNF              Patient currently is not medically stable to d/c.   Difficult to place patient No  Consultants:   orthopedics  Procedures:     Antimicrobials:    Subjective: Pt denies pain or discomfort.      Objective: Vitals:   11/01/20 0500 11/01/20 0551 11/01/20 0811 11/01/20 0900  BP:  (!) 147/81  (!) 151/77  Pulse:  81  77  Resp:  18  17  Temp:  98 F (36.7 C)  98.1 F (36.7 C)  TempSrc:  Oral  Axillary  SpO2:  100% 98% 100%  Weight: 44.3 kg     Height:        Intake/Output Summary (Last 24 hours) at 11/01/2020 1448 Last data filed at 11/01/2020 0406 Gross per 24 hour  Intake --  Output 850 ml  Net -850 ml   Filed Weights   10/30/20 0500 10/31/20 0500 11/01/20 0500  Weight: 48.5 kg 43.5 kg 44.3 kg   Examination:  General exam: Appears calm and comfortable. No  acute distress.     Respiratory system: Clear to auscultation. Respiratory effort normal. Cardiovascular system: normal S1 & S2 heard. No JVD, murmurs, rubs, gallops or clicks. No pedal edema. Gastrointestinal system: Abdomen is nondistended, soft and nontender. No organomegaly or masses felt. Normal bowel sounds heard. Central nervous system: Alert and oriented. No focal neurological deficits. Extremities: dressings clean and dry.  no bruising seen LLE or edema.  Skin: No rashes, lesions or ulcers.  Psychiatry: Judgement and insight poor. Mood & affect appropriate.   Data Reviewed: I  have personally reviewed following labs and imaging studies  CBC: Recent Labs  Lab 10/28/20 2129 10/30/20 0536 10/31/20 0107 11/01/20 0244  WBC 10.1 10.2 10.0 8.4  NEUTROABS 6.2 7.4 7.0 5.4  HGB 9.1* 9.6* 8.7* 8.6*  HCT 28.5* 29.2* 25.6* 26.7*  MCV 93.4 90.7 90.1 91.8  PLT 379 386 384 407*    Basic Metabolic Panel: Recent Labs  Lab 10/28/20 2208 10/29/20 1929 10/30/20 0536 10/31/20 0107 11/01/20 0244  NA 138  --  133* 133* 136  K 3.8  --  3.8 3.7 4.0  CL 104  --  100 101 101  CO2 25  --  23 25 26   GLUCOSE 128*  --  147* 120* 129*  BUN 24*  --  15 19 20   CREATININE 0.92  --  0.67 0.77 0.74  CALCIUM 9.6  --  9.0 8.7* 9.2  MG 1.8 1.5* 1.5* 2.2 1.9  PHOS 3.7 3.3 2.8 4.0 4.0    GFR: Estimated Creatinine Clearance: 53.6 mL/min (by C-G formula based on SCr of 0.74 mg/dL).  Liver Function Tests: Recent Labs  Lab 10/28/20 2208 10/30/20 0536 10/31/20 0107 11/01/20 0244  AST 25 22 14* 14*  ALT 52* 33 20 19  ALKPHOS 71 74 65 74  BILITOT 0.5 0.5 0.2* 0.5  PROT 6.6 6.4* 5.8* 6.0*  ALBUMIN 2.9* 2.8* 2.5* 2.5*    CBG: Recent Labs  Lab 10/31/20 1530 10/31/20 2016 11/01/20 0004 11/01/20 0400 11/01/20 1000  GLUCAP 92 120* 143* 130* 131*    Recent Results (from the past 240 hour(s))  Resp Panel by RT-PCR (Flu A&B, Covid) Nasopharyngeal Swab     Status: None   Collection Time: 10/28/20 10:00 PM   Specimen: Nasopharyngeal Swab; Nasopharyngeal(NP) swabs in vial transport medium  Result Value Ref Range Status   SARS Coronavirus 2 by RT PCR NEGATIVE NEGATIVE Final    Comment: (NOTE) SARS-CoV-2 target nucleic acids are NOT DETECTED.  The SARS-CoV-2 RNA is generally detectable in upper respiratory specimens during the acute phase of infection. The lowest concentration of SARS-CoV-2 viral copies this assay can detect is 138 copies/mL. A negative result does not preclude SARS-Cov-2 infection and should not be used as the sole basis for treatment or other patient  management decisions. A negative result may occur with  improper specimen collection/handling, submission of specimen other than nasopharyngeal swab, presence of viral mutation(s) within the areas targeted by this assay, and inadequate number of viral copies(<138 copies/mL). A negative result must be combined with clinical observations, patient history, and epidemiological information. The expected result is Negative.  Fact Sheet for Patients:  BloggerCourse.comhttps://www.fda.gov/media/152166/download  Fact Sheet for Healthcare Providers:  SeriousBroker.ithttps://www.fda.gov/media/152162/download  This test is no t yet approved or cleared by the [place] FDA and  has been authorized for detection and/or diagnosis of SARS-CoV-2 by FDA under an Emergency Use Authorization (EUA). This EUA will remain  in effect (meaning this test can be used) for the duration  of the COVID-19 declaration under Section 564(b)(1) of the Act, 21 U.S.C.section 360bbb-3(b)(1), unless the authorization is terminated  or revoked sooner.       Influenza A by PCR NEGATIVE NEGATIVE Final   Influenza B by PCR NEGATIVE NEGATIVE Final    Comment: (NOTE) The Xpert Xpress SARS-CoV-2/FLU/RSV plus assay is intended as an aid in the diagnosis of influenza from Nasopharyngeal swab specimens and should not be used as a sole basis for treatment. Nasal washings and aspirates are unacceptable for Xpert Xpress SARS-CoV-2/FLU/RSV testing.  Fact Sheet for Patients: BloggerCourse.com  Fact Sheet for Healthcare Providers: SeriousBroker.it  This test is not yet approved or cleared by the [place] FDA and has been authorized for detection and/or diagnosis of SARS-CoV-2 by FDA under an Emergency Use Authorization (EUA). This EUA will remain in effect (meaning this test can be used) for the duration of the COVID-19 declaration under Section 564(b)(1) of the Act, 21 U.S.C. section 360bbb-3(b)(1),  unless the authorization is terminated or revoked.  Performed at [facility], [address] [place], [place] [address]   Surgical pcr screen     Status: None   Collection Time: 10/29/20  8:47 AM   Specimen: Nasal Mucosa; Nasal Swab  Result Value Ref Range Status   MRSA, PCR NEGATIVE NEGATIVE Final   Staphylococcus aureus NEGATIVE NEGATIVE Final    Comment: (NOTE) The Xpert SA Assay (FDA approved for NASAL specimens in patients 48 years of age and older), is one component of a comprehensive surveillance program. It is not intended to diagnose infection nor to guide or monitor treatment. Performed at [facility], [address], [place], [place] [address]      Radiology Studies: No results found.  Scheduled Meds: . vitamin C  1,000 mg Per Tube Daily  . aspirin EC  325 mg Oral Q breakfast  . atorvastatin  40 mg Per Tube QHS  . buPROPion  150 mg Oral Daily  . cholecalciferol  2,000 Units Per Tube Daily  . docusate sodium  100 mg Oral BID  . escitalopram  10 mg Per Tube Daily  . feeding supplement (PROSource TF)  45 mL Per Tube TID  . folic acid  1 mg Per Tube Daily  . free water  200 mL Per Tube Q8H  . heparin  5,000 Units Subcutaneous Q8H  . levETIRAcetam  500 mg Per Tube BID  . loratadine  10 mg Per Tube Daily  . losartan  100 mg Per Tube Daily  . magnesium oxide  200 mg Per Tube Daily  . melatonin  5 mg Per Tube QHS  . mirtazapine  15 mg Per Tube QHS  . mometasone-formoterol  2 puff Inhalation BID  . montelukast  10 mg Per Tube QHS  . pantoprazole  40 mg Oral Daily  . polyethylene glycol  17 g Per Tube QHS  . risperiDONE  1 mg Per Tube QHS  . thiamine  100 mg Per Tube Daily  . zinc sulfate  220 mg Oral Daily   Continuous Infusions: . feeding supplement (OSMOLITE 1.5 CAL) 1,000 mL (11/01/20 0126)     LOS: 4 days   Time spent: 35 mins   Tanique Matney Laural Benes, MD How to contact the [facility] Attending or Consulting provider 7A - 7P or covering provider  during after hours 7P -7A, for this patient?  1. Check the care team in [facility] and look for a) attending/consulting TRH provider listed and b) the [facility] team listed  2. Log into [email or web address] and use [place]'s universal password to access. If you do not have the password, please contact the hospital operator. 3. Locate the [facility] provider you are looking for under [facility] and page to a number that you can be directly reached. 4. If you still have difficulty reaching the provider, please page the [facility] (Director on Call) for the Hospitalists listed on amion for assistance.  11/01/2020, 2:48 PM

## 2020-11-02 DIAGNOSIS — F259 Schizoaffective disorder, unspecified: Secondary | ICD-10-CM

## 2020-11-02 DIAGNOSIS — R41841 Cognitive communication deficit: Secondary | ICD-10-CM

## 2020-11-02 LAB — CBC WITH DIFFERENTIAL/PLATELET
Abs Immature Granulocytes: 0.04 10*3/uL (ref 0.00–0.07)
Basophils Absolute: 0 10*3/uL (ref 0.0–0.1)
Basophils Relative: 0 %
Eosinophils Absolute: 0.2 10*3/uL (ref 0.0–0.5)
Eosinophils Relative: 2 %
HCT: 24.7 % — ABNORMAL LOW (ref 36.0–46.0)
Hemoglobin: 8.1 g/dL — ABNORMAL LOW (ref 12.0–15.0)
Immature Granulocytes: 0 %
Lymphocytes Relative: 27 %
Lymphs Abs: 2.8 10*3/uL (ref 0.7–4.0)
MCH: 30.1 pg (ref 26.0–34.0)
MCHC: 32.8 g/dL (ref 30.0–36.0)
MCV: 91.8 fL (ref 80.0–100.0)
Monocytes Absolute: 0.8 10*3/uL (ref 0.1–1.0)
Monocytes Relative: 8 %
Neutro Abs: 6.5 10*3/uL (ref 1.7–7.7)
Neutrophils Relative %: 63 %
Platelets: 414 10*3/uL — ABNORMAL HIGH (ref 150–400)
RBC: 2.69 MIL/uL — ABNORMAL LOW (ref 3.87–5.11)
RDW: 13.6 % (ref 11.5–15.5)
WBC: 10.3 10*3/uL (ref 4.0–10.5)
nRBC: 0 % (ref 0.0–0.2)

## 2020-11-02 LAB — COMPREHENSIVE METABOLIC PANEL
ALT: 19 U/L (ref 0–44)
AST: 14 U/L — ABNORMAL LOW (ref 15–41)
Albumin: 2.5 g/dL — ABNORMAL LOW (ref 3.5–5.0)
Alkaline Phosphatase: 76 U/L (ref 38–126)
Anion gap: 8 (ref 5–15)
BUN: 23 mg/dL — ABNORMAL HIGH (ref 6–20)
CO2: 24 mmol/L (ref 22–32)
Calcium: 9.1 mg/dL (ref 8.9–10.3)
Chloride: 104 mmol/L (ref 98–111)
Creatinine, Ser: 0.7 mg/dL (ref 0.44–1.00)
GFR, Estimated: >60 mL/min (ref >60)
Glucose, Bld: 125 mg/dL — ABNORMAL HIGH (ref 70–99)
Potassium: 4.2 mmol/L (ref 3.5–5.1)
Sodium: 136 mmol/L (ref 135–145)
Total Bilirubin: 0.5 mg/dL (ref 0.3–1.2)
Total Protein: 6.1 g/dL — ABNORMAL LOW (ref 6.5–8.1)

## 2020-11-02 LAB — GLUCOSE, CAPILLARY
Glucose-Capillary: 118 mg/dL — ABNORMAL HIGH (ref 70–99)
Glucose-Capillary: 134 mg/dL — ABNORMAL HIGH (ref 70–99)
Glucose-Capillary: 136 mg/dL — ABNORMAL HIGH (ref 70–99)
Glucose-Capillary: 88 mg/dL (ref 70–99)

## 2020-11-02 LAB — PHOSPHORUS: Phosphorus: 3.8 mg/dL (ref 2.5–4.6)

## 2020-11-02 LAB — MAGNESIUM: Magnesium: 1.7 mg/dL (ref 1.7–2.4)

## 2020-11-02 MED ORDER — DOCUSATE SODIUM 100 MG PO CAPS: 100.0000 mg | ORAL_CAPSULE | Freq: Two times a day (BID) | ORAL | 0 refills | Status: AC

## 2020-11-02 MED ORDER — PROSOURCE TF PO LIQD: 45.0000 mL | Freq: Three times a day (TID) | ORAL | Status: DC

## 2020-11-02 MED ORDER — OSMOLITE 1.5 CAL PO LIQD: 1000.0000 mL | ORAL | 0 refills | Status: DC

## 2020-11-02 NOTE — TOC Transition Note (Signed)
Transition of Care [facility]) - CM/SW Discharge Note   Patient Details  Name: Tamara [name] MRN: [id number] Date of Birth: 1961-05-17  Transition of Care [facility]) CM/SW Contact:  Levada Schilling Phone Number: 11/02/2020, 12:54 PM   Clinical Narrative:     Patient will Discharge To: [facility] Anticipated DC Date:11/02/20 Family Notified: yes, daughter Tamara [name], [phone] Transport EO:FHQR   Per MD patient ready for DC to . RN, patient, patient's family, and facility notified of DC. Assessment, Fl2/Pasrr, and Discharge Summary sent to facility. RN given number for report [phone] ask for 2nd floor nurse. Room # 222A). DC packet on chart. Ambulance transport requested for patient.   CSW signing off.  Budd Palmer [facility] [phone]    Final next level of care: Skilled Nursing Facility Barriers to Discharge: No Barriers Identified   Patient Goals and CMS Choice   CMS Medicare.gov Compare Post Acute Care list provided to:: Patient Represenative (must comment) (daughter Tamara [name]) Choice offered to / list presented to : Adult Children (Daughter Scientist, research (medical))  Discharge Placement              Patient chooses bed at:  [facility]) Patient to be transferred to facility by: [facility] Name of family member notified: Tamara [name], daughter Patient and family notified of of transfer: 11/02/20  Discharge Plan and Services In-house Referral: Clinical Social Work                                   Social Determinants of Health (SDOH) Interventions     Readmission Risk Interventions No flowsheet data found.

## 2020-11-02 NOTE — Discharge Summary (Signed)
Physician Discharge Summary  Tamara [name] JYN:[id number] DOB: [birth year] DOA: 10/28/2020  PCP: [name], NP Orthopedics: Dr. Rebekah Chesterfield  Admit date: 10/28/2020 Discharge date: 11/02/2020  Admitted From:   SNF Disposition:  SNF   Recommendations for Outpatient Follow-up:  1. Follow up with Dr. Aundria Rud in 2 weeks ([facility])  2. Ortho instructions:  - maintain post op bandages until follow up.  If they become saturated you may remove and replace with daily dry dressings.  - ok for full weight bearing as tolerated to the right leg.    - apply ice to the right hip for 20-30 minutes out of each hour, as able  - for mild to moderate pain use tylenol and advil every 6 hours.  For breakthrough pain use Tramadol as needed.  - return to see Dr. Aundria Rud in 2 weeks.  Discharge Condition: STABLE  CODE STATUS: FULL  DIET: Tube Feedings to G tube  Osmolite 1.5 at 45 cc/hr  feeding supplement (PROSource TF) liquid - Place 45 mLs into feeding tube 3 (three) times daily.  free water Place 200 mLs into feeding tube every 8 (eight) hours  Brief Hospitalization Summary: Please see all hospital notes, images, labs for full details of the hospitalization. Brief Admission History:  60 y.o.femalewith medical history significant ofasthma, cognitive communication deficit, memory changes, schizoaffective disorder, history of metabolic encephalopathy, seizure disorder, history of COVID-19, type II DM, dysphagia, hypertension, major depressive disorder, severe protein calorie nutritionwho was brought via EMS from [place] due to right hip x-ray from 10/22/2020 suspicious for a nondisplaced femoral neck fracture. Patient is usually mobile in a wheelchair, but the facility staff stated that she has not been mobile over the last few days. She is unable to provide further information at this time.  ED Course:Initial vital signs were temperature99.4 F, pulse 79, respiration 15, BP  116/72 mmHg and O2 sat 98% on room air  Assessment & Plan:   Principal Problem:   Intertrochanteric fracture of right femur, closed, initial encounter Active Problems:   Type 2 diabetes mellitus    Dyslipidemia   Asthma   Essential hypertension   Protein-calorie malnutrition, severe   Normocytic anemia   Moderate protein malnutrition    Schizoaffective disorder   Cognitive communication deficit  1. Acute right intertrochanteric hip fracture - Ortho consulted and now post op day#4 s/p IM nail intertrochanteric right -  Analgesics as ordered. PT recommending SNF.  Hg stable around 8.  2. Dyslipidemia -resumed home atorvastatin.  3. Type 2 diabetes mellitus - resume home therapy. BS have been well controlled.   4. Schizoaffective Disorder - Resumed home risperdal daily.   5. Essential hypertension - resumed home medication.  6. Normocytic anemia - Hg stable around 8.  Recheck CBC in 1 week recommended.   7. Severe protein calorie malnutrition - Dietitian consulted to restart tube feeding after surgery.  Monitored Mg, Phos for signs of refeeding syndrome.  8. Hypomagnesemia - IV replacement ordered and repleted.      DVT prophylaxis:  SQ hep Code Status: full  Family Communication: t/c daughter 3/6 Disposition: SNF  Status is: Inpatient Discharge Diagnoses:  Principal Problem:   Intertrochanteric fracture of right femur, closed, initial encounter (HCC) Active Problems:   Type 2 diabetes mellitus (HCC)   Dyslipidemia   Asthma   Essential hypertension   Protein-calorie malnutrition, severe   Normocytic anemia   Moderate protein malnutrition (HCC)   Schizoaffective disorder (HCC)   Cognitive communication deficit  Discharge Instructions:  Allergies as of 11/02/2020      Reactions   Chlorhexidine       Medication List    STOP taking these medications   Dexlansoprazole 30 MG capsule     TAKE these medications   Acetaminophen 500 MG capsule Place 2 capsules (1,000  mg total) into feeding tube 3 (three) times daily as needed for pain.   albuterol 108 (90 Base) MCG/ACT inhaler Commonly known as: VENTOLIN HFA Inhale 2 puffs into the lungs every 6 (six) hours as needed for wheezing.   aspirin 81 MG chewable tablet Commonly known as: Aspirin Childrens Place 1 tablet (81 mg total) into feeding tube daily.   atorvastatin 80 MG tablet Commonly known as: LIPITOR Place 0.5 tablets (40 mg total) into feeding tube at bedtime.   buPROPion 150 MG 12 hr tablet Commonly known as: WELLBUTRIN SR Take 150 mg by mouth daily. Via tube   docusate sodium 100 MG capsule Commonly known as: COLACE Take 1 capsule (100 mg total) by mouth 2 (two) times daily.   escitalopram 10 MG tablet Commonly known as: LEXAPRO Place 10 mg into feeding tube daily.   feeding supplement (OSMOLITE 1.5 CAL) Liqd Place 1,000 mLs into feeding tube continuous. What changed: Another medication with the same name was changed. Make sure you understand how and when to take each.   feeding supplement (PROSource TF) liquid Place 45 mLs into feeding tube 3 (three) times daily. What changed: when to take this   Fluticasone-Salmeterol 250-50 MCG/DOSE Aepb Commonly known as: ADVAIR Inhale 1 puff into the lungs 2 (two) times daily.   folic acid 1 MG tablet Commonly known as: FOLVITE Place 1 tablet (1 mg total) into feeding tube daily.   free water Soln Place 200 mLs into feeding tube every 8 (eight) hours.   levETIRAcetam 100 MG/ML solution Commonly known as: KEPPRA Place 5 mLs (500 mg total) into feeding tube 2 (two) times daily.   loratadine 10 MG tablet Commonly known as: CLARITIN Place 1 tablet (10 mg total) into feeding tube daily.   losartan 100 MG tablet Commonly known as: COZAAR Place 1 tablet (100 mg total) into feeding tube daily.   magnesium 30 MG tablet Place 1 tablet (30 mg total) into feeding tube daily.   melatonin 5 MG Tabs Place 5 mg into feeding tube at  bedtime.   metFORMIN 1000 MG tablet Commonly known as: GLUCOPHAGE Place 1 tablet (1,000 mg total) into feeding tube 2 (two) times daily with a meal.   MiraLax 17 GM/SCOOP powder Generic drug: polyethylene glycol powder Place 17 g into feeding tube at bedtime.   mirtazapine 15 MG tablet Commonly known as: REMERON Place 15 mg into feeding tube at bedtime.   montelukast 10 MG tablet Commonly known as: SINGULAIR Place 1 tablet (10 mg total) into feeding tube at bedtime.   multivitamin with minerals Tabs tablet Place 1 tablet into feeding tube daily.   omeprazole 20 MG capsule Commonly known as: PRILOSEC Take 20 mg by mouth daily. Via tube   Quercetin 250 MG Tabs Give 250 mg by tube in the morning and at bedtime.   risperiDONE 1 MG tablet Commonly known as: RISPERDAL Place 1 mg into feeding tube at bedtime.   thiamine 100 MG tablet Place 1 tablet (100 mg total) into feeding tube daily.   traMADol 50 MG tablet Commonly known as: Ultram Take 1 tablet (50 mg total) by mouth every 6 (six) hours as needed for moderate  pain.   vitamin C 1000 MG tablet Place 1,000 mg into feeding tube daily.   Vitamin D2 50 MCG (2000 UT) Tabs Place 2,000 Units into feeding tube daily.   zinc gluconate 50 MG tablet Place 50 mg into feeding tube daily.       Follow-up Information    Yolonda Kidaogers, Jason Patrick, MD In 2 weeks.   Specialty: Orthopedic Surgery Why: For suture removal, For wound re-check Contact information: [address] [place] [address] [phone]              Allergies  Allergen Reactions  . Chlorhexidine    Allergies as of 11/02/2020      Reactions   Chlorhexidine       Medication List    STOP taking these medications   Dexlansoprazole 30 MG capsule     TAKE these medications   Acetaminophen 500 MG capsule Place 2 capsules (1,000 mg total) into feeding tube 3 (three) times daily as needed for pain.   albuterol 108 (90 Base) MCG/ACT  inhaler Commonly known as: VENTOLIN HFA Inhale 2 puffs into the lungs every 6 (six) hours as needed for wheezing.   aspirin 81 MG chewable tablet Commonly known as: Aspirin Childrens Place 1 tablet (81 mg total) into feeding tube daily.   atorvastatin 80 MG tablet Commonly known as: LIPITOR Place 0.5 tablets (40 mg total) into feeding tube at bedtime.   buPROPion 150 MG 12 hr tablet Commonly known as: WELLBUTRIN SR Take 150 mg by mouth daily. Via tube   docusate sodium 100 MG capsule Commonly known as: COLACE Take 1 capsule (100 mg total) by mouth 2 (two) times daily.   escitalopram 10 MG tablet Commonly known as: LEXAPRO Place 10 mg into feeding tube daily.   feeding supplement (OSMOLITE 1.5 CAL) Liqd Place 1,000 mLs into feeding tube continuous. What changed: Another medication with the same name was changed. Make sure you understand how and when to take each.   feeding supplement (PROSource TF) liquid Place 45 mLs into feeding tube 3 (three) times daily. What changed: when to take this   Fluticasone-Salmeterol 250-50 MCG/DOSE Aepb Commonly known as: ADVAIR Inhale 1 puff into the lungs 2 (two) times daily.   folic acid 1 MG tablet Commonly known as: FOLVITE Place 1 tablet (1 mg total) into feeding tube daily.   free water Soln Place 200 mLs into feeding tube every 8 (eight) hours.   levETIRAcetam 100 MG/ML solution Commonly known as: KEPPRA Place 5 mLs (500 mg total) into feeding tube 2 (two) times daily.   loratadine 10 MG tablet Commonly known as: CLARITIN Place 1 tablet (10 mg total) into feeding tube daily.   losartan 100 MG tablet Commonly known as: COZAAR Place 1 tablet (100 mg total) into feeding tube daily.   magnesium 30 MG tablet Place 1 tablet (30 mg total) into feeding tube daily.   melatonin 5 MG Tabs Place 5 mg into feeding tube at bedtime.   metFORMIN 1000 MG tablet Commonly known as: GLUCOPHAGE Place 1 tablet (1,000 mg total) into  feeding tube 2 (two) times daily with a meal.   MiraLax 17 GM/SCOOP powder Generic drug: polyethylene glycol powder Place 17 g into feeding tube at bedtime.   mirtazapine 15 MG tablet Commonly known as: REMERON Place 15 mg into feeding tube at bedtime.   montelukast 10 MG tablet Commonly known as: SINGULAIR Place 1 tablet (10 mg total) into feeding tube at bedtime.   multivitamin  with minerals Tabs tablet Place 1 tablet into feeding tube daily.   omeprazole 20 MG capsule Commonly known as: PRILOSEC Take 20 mg by mouth daily. Via tube   Quercetin 250 MG Tabs Give 250 mg by tube in the morning and at bedtime.   risperiDONE 1 MG tablet Commonly known as: RISPERDAL Place 1 mg into feeding tube at bedtime.   thiamine 100 MG tablet Place 1 tablet (100 mg total) into feeding tube daily.   traMADol 50 MG tablet Commonly known as: Ultram Take 1 tablet (50 mg total) by mouth every 6 (six) hours as needed for moderate pain.   vitamin C 1000 MG tablet Place 1,000 mg into feeding tube daily.   Vitamin D2 50 MCG (2000 UT) Tabs Place 2,000 Units into feeding tube daily.   zinc gluconate 50 MG tablet Place 50 mg into feeding tube daily.       Procedures/Studies: CT Head Wo Contrast  Result Date: 10/28/2020 CLINICAL DATA:  Altered mental status. EXAM: CT HEAD WITHOUT CONTRAST TECHNIQUE: Contiguous axial images were obtained from the base of the skull through the vertex without intravenous contrast. COMPARISON:  April 02, 2020 FINDINGS: Brain: There is mild cerebral atrophy with widening of the extra-axial spaces and ventricular dilatation. There are areas of decreased attenuation within the white matter tracts of the supratentorial brain, consistent with microvascular disease changes. Vascular: No hyperdense vessel or unexpected calcification. Skull: Normal. Negative for fracture or focal lesion. Sinuses/Orbits: No acute finding. Other: None. IMPRESSION: No acute intracranial  pathology. Electronically Signed   By: Aram Candela M.D.   On: 10/28/2020 23:27   Chest Portable 1 View  Result Date: 10/29/2020 CLINICAL DATA:  Preoperative evaluation. EXAM: PORTABLE CHEST 1 VIEW COMPARISON:  April 02, 2020 FINDINGS: The heart size and mediastinal contours are within normal limits. Both lungs are clear. The visualized skeletal structures are unremarkable. IMPRESSION: No active disease. Electronically Signed   By: Aram Candela M.D.   On: 10/29/2020 00:22   DG C-Arm 1-60 Min  Result Date: 10/29/2020 CLINICAL DATA:  RIGHT femoral fracture, nailing EXAM: DG C-ARM 1-60 MIN; RIGHT FEMUR 2 VIEWS CONTRAST:  None FLUOROSCOPY TIME:  Fluoroscopy Time:  0 minutes 53.3 seconds Radiation Exposure Index (if provided by the fluoroscopic device): 7.01 mGy Number of Acquired Spot Images: 7 COMPARISON:  10/28/2020 FINDINGS: Images demonstrate placement of an IM nail with compression screw and locking screw at the proximal RIGHT femur post ORIF of an intertrochanteric fracture. Bones demineralized. No dislocation. IMPRESSION: Post ORIF of intertrochanteric fracture RIGHT hip. Electronically Signed   By: Ulyses Southward M.D.   On: 10/29/2020 16:57   DG Hip Unilat W or Wo Pelvis 2-3 Views Right  Result Date: 10/28/2020 CLINICAL DATA:  Pain EXAM: DG HIP (WITH OR WITHOUT PELVIS) 2-3V RIGHT COMPARISON:  None. FINDINGS: There are lucencies coursing through the greater trochanter an intratrochanteric region, consistent with an acute, minimally displaced intratrochanteric fracture of the proximal right femur. There is osteopenia. There are moderate degenerative changes of both hips. IMPRESSION: Acute, minimally displaced intratrochanteric fracture of the proximal right femur. Electronically Signed   By: Katherine Mantle M.D.   On: 10/28/2020 20:38   DG FEMUR, MIN 2 VIEWS RIGHT  Result Date: 10/29/2020 CLINICAL DATA:  RIGHT femoral fracture, nailing EXAM: DG C-ARM 1-60 MIN; RIGHT FEMUR 2 VIEWS CONTRAST:   None FLUOROSCOPY TIME:  Fluoroscopy Time:  0 minutes 53.3 seconds Radiation Exposure Index (if provided by the fluoroscopic device): 7.01 mGy Number of Acquired  Spot Images: 7 COMPARISON:  10/28/2020 FINDINGS: Images demonstrate placement of an IM nail with compression screw and locking screw at the proximal RIGHT femur post ORIF of an intertrochanteric fracture. Bones demineralized. No dislocation. IMPRESSION: Post ORIF of intertrochanteric fracture RIGHT hip. Electronically Signed   By: Ulyses Southward M.D.   On: 10/29/2020 16:57      Subjective: Pt without complaint today.    Discharge Exam: Vitals:   11/02/20 0803 11/02/20 0804  BP:  114/90  Pulse: 77 82  Resp: 17 17  Temp:  99.2 F (37.3 C)  SpO2: 100% 100%   Vitals:   11/01/20 2016 11/02/20 0352 11/02/20 0803 11/02/20 0804  BP: 118/74 122/70  114/90  Pulse: 81 86 77 82  Resp: Temp: 99.3 F (37.4 C) 99.4 F (37.4 C)  99.2 F (37.3 C)  TempSrc: Oral Oral  Oral  SpO2: 100% 100% 100% 100%  Weight:      Height:       General exam: Appears calm and comfortable. No acute distress.     Respiratory system: Clear to auscultation. Respiratory effort normal. Cardiovascular system: normal S1 & S2 heard. No JVD, murmurs, rubs, gallops or clicks. No pedal edema. Gastrointestinal system: Abdomen is nondistended, soft and nontender. No organomegaly or masses felt. Normal bowel sounds heard. Central nervous system: Alert and oriented. No focal neurological deficits. Extremities: dressings clean and dry.  no bruising seen LLE or edema. Warm with good pulses palpated.   Skin: No rashes, lesions or ulcers.  Psychiatry: Judgement and insight poor. Mood & affect appropriate.    The results of significant diagnostics from this hospitalization (including imaging, microbiology, ancillary and laboratory) are listed below for reference.     Microbiology: Recent Results (from the past 240 hour(s))  Resp Panel by RT-PCR (Flu A&B, Covid)  Nasopharyngeal Swab     Status: None   Collection Time: 10/28/20 10:00 PM   Specimen: Nasopharyngeal Swab; Nasopharyngeal(NP) swabs in vial transport medium  Result Value Ref Range Status   SARS Coronavirus 2 by RT PCR NEGATIVE NEGATIVE Final    Comment: (NOTE) SARS-CoV-2 target nucleic acids are NOT DETECTED.  The SARS-CoV-2 RNA is generally detectable in upper respiratory specimens during the acute phase of infection. The lowest concentration of SARS-CoV-2 viral copies this assay can detect is 138 copies/mL. A negative result does not preclude SARS-Cov-2 infection and should not be used as the sole basis for treatment or other patient management decisions. A negative result may occur with  improper specimen collection/handling, submission of specimen other than nasopharyngeal swab, presence of viral mutation(s) within the areas targeted by this assay, and inadequate number of viral copies(<138 copies/mL). A negative result must be combined with clinical observations, patient history, and epidemiological information. The expected result is Negative.  Fact Sheet for Patients:  BloggerCourse.com  Fact Sheet for Healthcare Providers:  SeriousBroker.it  This test is no t yet approved or cleared by the [place] FDA and  has been authorized for detection and/or diagnosis of SARS-CoV-2 by FDA under an Emergency Use Authorization (EUA). This EUA will remain  in effect (meaning this test can be used) for the duration of the COVID-19 declaration under Section 564(b)(1) of the Act, 21 U.S.C.section 360bbb-3(b)(1), unless the authorization is terminated  or revoked sooner.       Influenza A by PCR NEGATIVE NEGATIVE Final   Influenza B by PCR NEGATIVE NEGATIVE Final    Comment: (NOTE) The Xpert Xpress SARS-CoV-2/FLU/RSV plus  assay is intended as an aid in the diagnosis of influenza from Nasopharyngeal swab specimens and should not be  used as a sole basis for treatment. Nasal washings and aspirates are unacceptable for Xpert Xpress SARS-CoV-2/FLU/RSV testing.  Fact Sheet for Patients: BloggerCourse.com  Fact Sheet for Healthcare Providers: SeriousBroker.it  This test is not yet approved or cleared by the [place] FDA and has been authorized for detection and/or diagnosis of SARS-CoV-2 by FDA under an Emergency Use Authorization (EUA). This EUA will remain in effect (meaning this test can be used) for the duration of the COVID-19 declaration under Section 564(b)(1) of the Act, 21 U.S.C. section 360bbb-3(b)(1), unless the authorization is terminated or revoked.  Performed at [facility], [address], [place], [place] [address]   Surgical pcr screen     Status: None   Collection Time: 10/29/20  8:47 AM   Specimen: Nasal Mucosa; Nasal Swab  Result Value Ref Range Status   MRSA, PCR NEGATIVE NEGATIVE Final   Staphylococcus aureus NEGATIVE NEGATIVE Final    Comment: (NOTE) The Xpert SA Assay (FDA approved for NASAL specimens in patients 21 years of age and older), is one component of a comprehensive surveillance program. It is not intended to diagnose infection nor to guide or monitor treatment. Performed at [facility], [address] [place], [place] [address]   SARS CORONAVIRUS 2 (TAT 6-24 HRS) Nasopharyngeal Nasopharyngeal Swab     Status: None   Collection Time: 11/01/20 10:24 AM   Specimen: Nasopharyngeal Swab  Result Value Ref Range Status   SARS Coronavirus 2 NEGATIVE NEGATIVE Final    Comment: (NOTE) SARS-CoV-2 target nucleic acids are NOT DETECTED.  The SARS-CoV-2 RNA is generally detectable in upper and lower respiratory specimens during the acute phase of infection. Negative results do not preclude SARS-CoV-2 infection, do not rule out co-infections with other pathogens, and should not be used as the sole basis for  treatment or other patient management decisions. Negative results must be combined with clinical observations, patient history, and epidemiological information. The expected result is Negative.  Fact Sheet for Patients: HairSlick.no  Fact Sheet for Healthcare Providers: quierodirigir.com  This test is not yet approved or cleared by the [place] FDA and  has been authorized for detection and/or diagnosis of SARS-CoV-2 by FDA under an Emergency Use Authorization (EUA). This EUA will remain  in effect (meaning this test can be used) for the duration of the COVID-19 declaration under Se ction 564(b)(1) of the Act, 21 U.S.C. section 360bbb-3(b)(1), unless the authorization is terminated or revoked sooner.  Performed at [facility], [address], [place], [place] [address]      Labs: BNP (last 3 results) No results for input(s): BNP in the last 8760 hours. Basic Metabolic Panel: Recent Labs  Lab 10/28/20 2208 10/29/20 1929 10/30/20 0536 10/31/20 0107 11/01/20 0244 11/02/20 0125  NA 138  --  133* 133* 136 136  K 3.8  --  3.8 3.7 4.0 4.2  CL 104  --  100 101 101 104  CO2 25  --  GLUCOSE 128*  --  147* 120* 129* 125*  BUN 24*  --  23*  CREATININE 0.92  --  0.67 0.77 0.74 0.70  CALCIUM 9.6  --  9.0 8.7* 9.2 9.1  MG 1.8 1.5* 1.5* 2.2 1.9 1.7  PHOS 3.7 3.3 2.8 4.0 4.0 3.8   Liver Function Tests: Recent Labs  Lab 10/28/20 2208 10/30/20 0536 10/31/20 0107 11/01/20 0244 11/02/20 0125  AST 25 22 14* 14* 14*  ALT 52* 33 ALKPHOS 71 74 65 74 76  BILITOT 0.5 0.5 0.2* 0.5 0.5  PROT 6.6 6.4* 5.8* 6.0* 6.1*  ALBUMIN 2.9* 2.8* 2.5* 2.5* 2.5*   No results for input(s): LIPASE, AMYLASE in the last 168 hours. No results for input(s): AMMONIA in the last 168 hours. CBC: Recent Labs  Lab 10/28/20 2129 10/30/20 0536 10/31/20 0107 11/01/20 0244 11/02/20 0125  WBC 10.1 10.2 10.0  8.4 10.3  NEUTROABS 6.2 7.4 7.0 5.4 6.5  HGB 9.1* 9.6* 8.7* 8.6* 8.1*  HCT 28.5* 29.2* 25.6* 26.7* 24.7*  MCV 93.4 90.7 90.1 91.8 91.8  PLT 379 386 384 407* 414*   Cardiac Enzymes: No results for input(s): CKTOTAL, CKMB, CKMBINDEX, TROPONINI in the last 168 hours. BNP: Invalid input(s): POCBNP CBG: Recent Labs  Lab 11/01/20 2016 11/02/20 0035 11/02/20 0356 11/02/20 0801 11/02/20 1145  GLUCAP 112* 118* 136* 134* 88   D-Dimer No results for input(s): DDIMER in the last 72 hours. Hgb A1c No results for input(s): HGBA1C in the last 72 hours. Lipid Profile No results for input(s): CHOL, HDL, LDLCALC, TRIG, CHOLHDL, LDLDIRECT in the last 72 hours. Thyroid function studies No results for input(s): TSH, T4TOTAL, T3FREE, THYROIDAB in the last 72 hours.  Invalid input(s): FREET3 Anemia work up No results for input(s): VITAMINB12, FOLATE, FERRITIN, TIBC, IRON, RETICCTPCT in the last 72 hours. Urinalysis    Component Value Date/Time   COLORURINE YELLOW 04/02/2020 1918   APPEARANCEUR HAZY (A) 04/02/2020 1918   LABSPEC 1.010 04/02/2020 1918   PHURINE 6.0 04/02/2020 1918   GLUCOSEU NEGATIVE 04/02/2020 1918   HGBUR MODERATE (A) 04/02/2020 1918   BILIRUBINUR NEGATIVE 04/02/2020 1918   KETONESUR NEGATIVE 04/02/2020 1918   PROTEINUR NEGATIVE 04/02/2020 1918   UROBILINOGEN 1.0 08/07/2007 0800   NITRITE NEGATIVE 04/02/2020 1918   LEUKOCYTESUR SMALL (A) 04/02/2020 1918   Sepsis Labs Invalid input(s): PROCALCITONIN,  WBC,  LACTICIDVEN Microbiology Recent Results (from the past 240 hour(s))  Resp Panel by RT-PCR (Flu A&B, Covid) Nasopharyngeal Swab     Status: None   Collection Time: 10/28/20 10:00 PM   Specimen: Nasopharyngeal Swab; Nasopharyngeal(NP) swabs in vial transport medium  Result Value Ref Range Status   SARS Coronavirus 2 by RT PCR NEGATIVE NEGATIVE Final    Comment: (NOTE) SARS-CoV-2 target nucleic acids are NOT DETECTED.  The SARS-CoV-2 RNA is generally detectable  in upper respiratory specimens during the acute phase of infection. The lowest concentration of SARS-CoV-2 viral copies this assay can detect is 138 copies/mL. A negative result does not preclude SARS-Cov-2 infection and should not be used as the sole basis for treatment or other patient management decisions. A negative result may occur with  improper specimen collection/handling, submission of specimen other than nasopharyngeal swab, presence of viral mutation(s) within the areas targeted by this assay, and inadequate number of viral copies(<138 copies/mL). A negative result must be combined with clinical observations, patient history, and epidemiological information. The expected result is Negative.  Fact Sheet for Patients:  BloggerCourse.com  Fact Sheet for Healthcare Providers:  SeriousBroker.it  This test is no t yet approved or cleared by the [place] FDA and  has been authorized for detection and/or diagnosis of SARS-CoV-2 by FDA under an Emergency Use Authorization (EUA). This EUA will remain  in effect (meaning this test can be used) for the duration of the COVID-19 declaration  under Section 564(b)(1) of the Act, 21 U.S.C.section 360bbb-3(b)(1), unless the authorization is terminated  or revoked sooner.       Influenza A by PCR NEGATIVE NEGATIVE Final   Influenza B by PCR NEGATIVE NEGATIVE Final    Comment: (NOTE) The Xpert Xpress SARS-CoV-2/FLU/RSV plus assay is intended as an aid in the diagnosis of influenza from Nasopharyngeal swab specimens and should not be used as a sole basis for treatment. Nasal washings and aspirates are unacceptable for Xpert Xpress SARS-CoV-2/FLU/RSV testing.  Fact Sheet for Patients: BloggerCourse.com  Fact Sheet for Healthcare Providers: SeriousBroker.it  This test is not yet approved or cleared by the [place] FDA and has  been authorized for detection and/or diagnosis of SARS-CoV-2 by FDA under an Emergency Use Authorization (EUA). This EUA will remain in effect (meaning this test can be used) for the duration of the COVID-19 declaration under Section 564(b)(1) of the Act, 21 U.S.C. section 360bbb-3(b)(1), unless the authorization is terminated or revoked.  Performed at [facility], [address] [place], [place] [address]   Surgical pcr screen     Status: None   Collection Time: 10/29/20  8:47 AM   Specimen: Nasal Mucosa; Nasal Swab  Result Value Ref Range Status   MRSA, PCR NEGATIVE NEGATIVE Final   Staphylococcus aureus NEGATIVE NEGATIVE Final    Comment: (NOTE) The Xpert SA Assay (FDA approved for NASAL specimens in patients 85 years of age and older), is one component of a comprehensive surveillance program. It is not intended to diagnose infection nor to guide or monitor treatment. Performed at [facility], [address] [place], [place] [address]   SARS CORONAVIRUS 2 (TAT 6-24 HRS) Nasopharyngeal Nasopharyngeal Swab     Status: None   Collection Time: 11/01/20 10:24 AM   Specimen: Nasopharyngeal Swab  Result Value Ref Range Status   SARS Coronavirus 2 NEGATIVE NEGATIVE Final    Comment: (NOTE) SARS-CoV-2 target nucleic acids are NOT DETECTED.  The SARS-CoV-2 RNA is generally detectable in upper and lower respiratory specimens during the acute phase of infection. Negative results do not preclude SARS-CoV-2 infection, do not rule out co-infections with other pathogens, and should not be used as the sole basis for treatment or other patient management decisions. Negative results must be combined with clinical observations, patient history, and epidemiological information. The expected result is Negative.  Fact Sheet for Patients: HairSlick.no  Fact Sheet for Healthcare Providers: quierodirigir.com  This test is  not yet approved or cleared by the [place] FDA and  has been authorized for detection and/or diagnosis of SARS-CoV-2 by FDA under an Emergency Use Authorization (EUA). This EUA will remain  in effect (meaning this test can be used) for the duration of the COVID-19 declaration under Se ction 564(b)(1) of the Act, 21 U.S.C. section 360bbb-3(b)(1), unless the authorization is terminated or revoked sooner.  Performed at [facility], [address] [place], [place] [address]    Time coordinating discharge:  55 mins   SIGNED:  Standley Dakins, MD  [facility] 11/02/2020, 11:51 AM How to contact the [facility] Attending or Consulting provider 7A - 7P or covering provider during after hours 7P -7A, for this patient?  1. Check the care team in [facility] and look for a) attending/consulting [facility] provider listed and b) the [facility] team listed 2. Log into [email or web address] and use Alex's universal password to access. If you do not have the password, please contact the hospital operator. 3.  Locate the [facility] provider you are looking for under [facility] and page to a number that you can be directly reached. 4. If you still have difficulty reaching the provider, please page the [facility] (Director on Call) for the Hospitalists listed on amion for assistance.

## 2020-11-02 NOTE — Progress Notes (Signed)
Discharge package printed and will send with [facility]. Called to give report unable to get in touch with nurse. Message left with phone number.

## 2020-11-02 NOTE — Progress Notes (Signed)
[place] called back and report given.

## 2020-11-02 NOTE — Plan of Care (Signed)
  Problem: Safety: Goal: Ability to remain free from injury will improve Outcome: Progressing   

## 2020-11-02 NOTE — Discharge Instructions (Signed)
-   maintain post op bandages until follow up.  If they become saturated you may remove and replace with daily dry dressings.  - ok for full weight bearing as tolerated to the right leg.    - apply ice to the right hip for 20-30 minutes out of each hour, as able  - for mild to moderate pain use tylenol and advil every 6 hours.  For breakthrough pain use Tramadol as needed.  - return to see Dr. Aundria Rud in 2 weeks.

## 2020-12-21 ENCOUNTER — Emergency Department (HOSPITAL_COMMUNITY)
Admission: EM | Admit: 2020-12-21 | Discharge: 2020-12-21 | Disposition: A | Discharge status: Home / Self Care | Payer: Medicaid Other | Attending: Emergency Medicine | Admitting: Emergency Medicine

## 2020-12-21 ENCOUNTER — Emergency Department (HOSPITAL_COMMUNITY): Payer: Medicaid Other

## 2020-12-21 DIAGNOSIS — E1122 Type 2 diabetes mellitus with diabetic chronic kidney disease: Secondary | ICD-10-CM | POA: Insufficient documentation

## 2020-12-21 DIAGNOSIS — Z7952 Long term (current) use of systemic steroids: Secondary | ICD-10-CM | POA: Insufficient documentation

## 2020-12-21 DIAGNOSIS — Z8616 Personal history of COVID-19: Secondary | ICD-10-CM | POA: Diagnosis not present

## 2020-12-21 DIAGNOSIS — F0390 Unspecified dementia without behavioral disturbance: Secondary | ICD-10-CM | POA: Diagnosis not present

## 2020-12-21 DIAGNOSIS — S0181XA Laceration without foreign body of other part of head, initial encounter: Secondary | ICD-10-CM | POA: Diagnosis not present

## 2020-12-21 DIAGNOSIS — R410 Disorientation, unspecified: Secondary | ICD-10-CM | POA: Diagnosis not present

## 2020-12-21 DIAGNOSIS — I129 Hypertensive chronic kidney disease with stage 1 through stage 4 chronic kidney disease, or unspecified chronic kidney disease: Secondary | ICD-10-CM | POA: Diagnosis not present

## 2020-12-21 DIAGNOSIS — N189 Chronic kidney disease, unspecified: Secondary | ICD-10-CM | POA: Diagnosis not present

## 2020-12-21 DIAGNOSIS — S0990XA Unspecified injury of head, initial encounter: Secondary | ICD-10-CM | POA: Diagnosis present

## 2020-12-21 DIAGNOSIS — Z7982 Long term (current) use of aspirin: Secondary | ICD-10-CM | POA: Diagnosis not present

## 2020-12-21 DIAGNOSIS — J45909 Unspecified asthma, uncomplicated: Secondary | ICD-10-CM | POA: Insufficient documentation

## 2020-12-21 DIAGNOSIS — Z7984 Long term (current) use of oral hypoglycemic drugs: Secondary | ICD-10-CM | POA: Insufficient documentation

## 2020-12-21 DIAGNOSIS — Z79899 Other long term (current) drug therapy: Secondary | ICD-10-CM | POA: Diagnosis not present

## 2020-12-21 DIAGNOSIS — W050XXA Fall from non-moving wheelchair, initial encounter: Secondary | ICD-10-CM | POA: Diagnosis not present

## 2020-12-21 DIAGNOSIS — W19XXXA Unspecified fall, initial encounter: Secondary | ICD-10-CM

## 2020-12-21 NOTE — ED Provider Notes (Signed)
[facility] EMERGENCY DEPARTMENT Provider Note   CSN: [id number] Arrival date & time: 12/21/20  1506     History Chief Complaint  Patient presents with  . Fall    Tamara [name] is a 60 y.o. female.  Patient has difficulty ambulation.  Fell no loss of conscious.  No blood thinners.  History of early onset dementia.  Significant cognitive delay.  Patient reports no pain.  Family states this is her normal state of health for her.   Fall This is a new problem.       Past Medical History:  Diagnosis Date  . Asthma   . Cognitive communication deficit   . COVID-19   . Diabetes mellitus without complication (HCC)   . Dysphagia, oropharyngeal phase   . Hypertension   . Major depressive disorder with single episode   . Memory changes   . Metabolic encephalopathy   . Muscle weakness (generalized)   . Other abnormalities of gait and mobility   . Renal disorder   . Schizoaffective disorder (HCC)   . Seizures (HCC)   . Unspecified sequelae of unspecified cerebrovascular disease   . Unspecified severe protein-calorie malnutrition (HCC)   . Unsteadiness on feet     Patient Active Problem List   Diagnosis Date Noted  . Schizoaffective disorder (HCC)   . Cognitive communication deficit   . Intertrochanteric fracture of right femur, closed, initial encounter (HCC) 10/28/2020  . Normocytic anemia 10/28/2020  . Moderate protein malnutrition (HCC) 10/28/2020  . Protein-calorie malnutrition, severe 04/05/2020  . AKI (acute kidney injury) (HCC) 04/02/2020  . Acute metabolic encephalopathy 04/02/2020  . Acute encephalopathy 03/01/2020  . Asthma 03/01/2020  . Dehydration 03/01/2020  . Acute kidney injury superimposed on CKD (HCC) 03/01/2020  . Hyperammonemia (HCC) 03/01/2020  . Elevated serum hCG 03/01/2020  . Hypermagnesemia 03/01/2020  . Fall 03/01/2020  . Vomiting 03/01/2020  . Essential hypertension 03/01/2020  . AMS (altered mental status) 03/01/2020  .  Chest pain 11/03/2018  . Asthma with acute exacerbation 01/14/2013  . Type 2 diabetes mellitus (HCC) 01/14/2013  . Dyslipidemia 01/14/2013    Past Surgical History:  Procedure Laterality Date  . INTRAMEDULLARY (IM) NAIL INTERTROCHANTERIC Right 10/29/2020   Procedure: INTRAMEDULLARY (IM) NAIL INTERTROCHANTRIC;  Surgeon: Yolonda Kida, MD;  Location: [facility] OR;  Service: Orthopedics;  Laterality: Right;  . IR GASTROSTOMY TUBE MOD SED  04/19/2020     OB History   No obstetric history on file.     Family History  Problem Relation Age of Onset  . Hypertension Mother   . Hypertension Father     Social History   Tobacco Use  . Smoking status: Never Smoker  . Smokeless tobacco: Never Used  Substance Use Topics  . Alcohol use: Not Currently  . Drug use: Not Currently    Home Medications Prior to Admission medications   Medication Sig Start Date End Date Taking? Authorizing Provider  Acetaminophen 500 MG capsule Place 2 capsules (1,000 mg total) into feeding tube 3 (three) times daily as needed for pain. 04/23/20   Marguerita Merles Latif, DO  albuterol (PROVENTIL HFA;VENTOLIN HFA) 108 (90 BASE) MCG/ACT inhaler Inhale 2 puffs into the lungs every 6 (six) hours as needed for wheezing. 10/04/12   Johnson, Clanford L, MD  Ascorbic Acid (VITAMIN C) 1000 MG tablet Place 1,000 mg into feeding tube daily.    [provider]  aspirin (ASPIRIN CHILDRENS) 81 MG chewable tablet Place 1 tablet (81 mg total) into feeding  tube daily. 04/23/20   Marguerita MerlesSheikh, Omair Latif, DO  atorvastatin (LIPITOR) 80 MG tablet Place 0.5 tablets (40 mg total) into feeding tube at bedtime. 04/23/20   Marguerita MerlesSheikh, Omair Latif, DO  buPROPion (WELLBUTRIN SR) 150 MG 12 hr tablet Take 150 mg by mouth daily. Via tube    [provider]  docusate sodium (COLACE) 100 MG capsule Take 1 capsule (100 mg total) by mouth 2 (two) times daily. 11/02/20   Johnson, Clanford L, MD  Ergocalciferol (VITAMIN D2) 50 MCG (2000 UT) TABS  Place 2,000 Units into feeding tube daily.    [provider]  escitalopram (LEXAPRO) 10 MG tablet Place 10 mg into feeding tube daily.    [provider]  Fluticasone-Salmeterol (ADVAIR) 250-50 MCG/DOSE AEPB Inhale 1 puff into the lungs 2 (two) times daily.    [provider]  folic acid (FOLVITE) 1 MG tablet Place 1 tablet (1 mg total) into feeding tube daily. 04/23/20   Marguerita MerlesSheikh, Omair Latif, DO  levETIRAcetam (KEPPRA) 100 MG/ML solution Place 5 mLs (500 mg total) into feeding tube 2 (two) times daily. 04/23/20   Marguerita MerlesSheikh, Omair Latif, DO  loratadine (CLARITIN) 10 MG tablet Place 1 tablet (10 mg total) into feeding tube daily. 04/23/20   Marguerita MerlesSheikh, Omair Latif, DO  losartan (COZAAR) 100 MG tablet Place 1 tablet (100 mg total) into feeding tube daily. 04/23/20   Marguerita MerlesSheikh, Omair Latif, DO  magnesium 30 MG tablet Place 1 tablet (30 mg total) into feeding tube daily. 04/23/20   Sheikh, Omair Latif, DO  melatonin 5 MG TABS Place 5 mg into feeding tube at bedtime.    [provider]  metFORMIN (GLUCOPHAGE) 1000 MG tablet Place 1 tablet (1,000 mg total) into feeding tube 2 (two) times daily with a meal. 04/23/20   Sheikh, Omair Latif, DO  MIRALAX 17 GM/SCOOP powder Place 17 g into feeding tube at bedtime. 04/23/20   Marguerita MerlesSheikh, Omair Latif, DO  mirtazapine (REMERON) 15 MG tablet Place 15 mg into feeding tube at bedtime.    [provider]  montelukast (SINGULAIR) 10 MG tablet Place 1 tablet (10 mg total) into feeding tube at bedtime. 04/23/20   Marguerita MerlesSheikh, Omair Latif, DO  Multiple Vitamin (MULTIVITAMIN WITH MINERALS) TABS tablet Place 1 tablet into feeding tube daily. 04/23/20   Marguerita MerlesSheikh, Omair Latif, DO  Nutritional Supplements (FEEDING SUPPLEMENT, OSMOLITE 1.5 CAL,) LIQD Place 1,000 mLs into feeding tube continuous. 11/02/20   Johnson, Clanford L, MD  Nutritional Supplements (FEEDING SUPPLEMENT, PROSOURCE TF,) liquid Place 45 mLs into feeding tube 3 (three) times daily. 11/02/20   Johnson,  Clanford L, MD  omeprazole (PRILOSEC) 20 MG capsule Take 20 mg by mouth daily. Via tube    [provider]  Quercetin 250 MG TABS Give 250 mg by tube in the morning and at bedtime.    [provider]  risperiDONE (RISPERDAL) 1 MG tablet Place 1 mg into feeding tube at bedtime.    [provider]  thiamine 100 MG tablet Place 1 tablet (100 mg total) into feeding tube daily. 04/23/20   Marguerita MerlesSheikh, Omair Latif, DO  traMADol (ULTRAM) 50 MG tablet Take 1 tablet (50 mg total) by mouth every 6 (six) hours as needed for moderate pain. 10/29/20 10/29/21  Yolonda Kidaogers, Jason Patrick, MD  Water For Irrigation, Sterile (FREE WATER) SOLN Place 200 mLs into feeding tube every 8 (eight) hours. 04/23/20   Marguerita MerlesSheikh, Omair Latif, DO  zinc gluconate 50 MG tablet Place 50 mg into feeding tube daily.  [provider]    Allergies    Chlorhexidine  Review of Systems   Review of Systems  Unable to perform ROS: Dementia    Physical Exam Updated Vital Signs BP 114/81 (BP Location: Right Arm)   Pulse 68   Temp 98.4 F (36.9 C) (Oral)   Resp 12   SpO2 100%   Physical Exam Vitals and nursing note reviewed. Exam conducted with a chaperone present.  Constitutional:      General: She is not in acute distress.    Appearance: Normal appearance.  HENT:     Head: Normocephalic.     Comments: Small 1 cm laceration over the left eyebrow.  Well approximated only through the dermis, hemostatic    Nose: No rhinorrhea.  Eyes:     General:        Right eye: No discharge.        Left eye: No discharge.     Conjunctiva/sclera: Conjunctivae normal.  Cardiovascular:     Rate and Rhythm: Normal rate and regular rhythm.  Pulmonary:     Effort: Pulmonary effort is normal. No respiratory distress.     Breath sounds: No stridor.  Abdominal:     General: Abdomen is flat. There is no distension.     Palpations: Abdomen is soft.     Tenderness: There is no abdominal tenderness.  Genitourinary:     Vagina: Vaginal discharge:    Musculoskeletal:        General: No tenderness or signs of injury.  Skin:    General: Skin is warm and dry.  Neurological:     Mental Status: She is alert. She is disoriented.     Cranial Nerves: No cranial nerve deficit.     Sensory: No sensory deficit.     Motor: No weakness.     Comments: Patient has confusion and has difficulty following complex commands and cannot ambulate at baseline.  Moves all 4 extremities has symmetric facial features is able to speak clearly but is confused  Psychiatric:        Mood and Affect: Mood normal.        Behavior: Behavior normal.     ED Results / Procedures / Treatments   Labs (all labs ordered are listed, but only abnormal results are displayed) Labs Reviewed - No data to display  EKG None  Radiology CT Head Wo Contrast  Result Date: 12/21/2020 CLINICAL DATA:  Fall with left forehead abrasion EXAM: CT HEAD WITHOUT CONTRAST TECHNIQUE: Contiguous axial images were obtained from the base of the skull through the vertex without intravenous contrast. COMPARISON:  October 29, 2018. FINDINGS: Brain: No evidence of acute large vascular territory infarction, hemorrhage, hydrocephalus, extra-axial collection or mass lesion/mass effect. Mild global parenchymal volume loss. Stable mild burden of chronic ischemic white matter disease. Vascular: No hyperdense vessel or unexpected calcification. Skull: Hyperostosis interna.  Negative for fracture or focal lesion. Sinuses/Orbits: The paranasal sinuses and mastoid air cells are predominantly clear. Orbits are grossly unremarkable. Other: None. IMPRESSION: 1. No acute intracranial pathology. 2. Stable mild global parenchymal volume loss and chronic ischemic white matter disease. Electronically Signed   By: Maudry Mayhew MD   On: 12/21/2020 18:54    Procedures Procedures   Medications Ordered in ED Medications - No data to display  ED Course  I have reviewed the triage vital  signs and the nursing notes.  Pertinent labs & imaging results that were available during my care of the patient were reviewed  by me and considered in my medical decision making (see chart for details).    MDM Rules/Calculators/A&P                          Witnessed mechanical fall.  Small laceration was well approximated, which is put adhesive bandage over.  Spoke with family, this is her baseline mental status and neurologic function.  CT scan of the head and likely discharge back to facility.  CT imaging reviewed by radiology myself shows no acute intracranial pathology.  I spoke with family they feel comfortable discharge home.  Patient will be discharged. Final Clinical Impression(s) / ED Diagnoses Final diagnoses:  Fall, initial encounter  Facial laceration, initial encounter    Rx / DC Orders ED Discharge Orders    None       Sabino Donovan, MD 12/21/20 [phone]

## 2020-12-21 NOTE — ED Notes (Signed)
Attempted to give report to facility but no answer.

## 2020-12-21 NOTE — ED Triage Notes (Signed)
Pt arrives to ED from [facility] BIB [facility] for a witness fall. Per EMS pt fell out of wheelchair and has a Laceration to the Lf eyebrow. No Blood Thinners, No LOC.   BP 108/62 HR 72 R 16 O2 97% RA CBG 90

## 2020-12-21 NOTE — ED Notes (Signed)
Called [facility] for patient to be transported to [facility]. Currently 6th on the list

## 2020-12-21 NOTE — Discharge Instructions (Addendum)
Apply adhesive bandage over the wound as needed.  Keep it clean and dry.  Imaging was negative for any intracranial injury.  Patient seems comfortable.  We spoke to family family feels comfortable with plan of CT head and discharge if negative.  Imaging was negative

## 2020-12-26 ENCOUNTER — Emergency Department (HOSPITAL_COMMUNITY)
Admission: EM | Admit: 2020-12-26 | Discharge: 2020-12-26 | Disposition: A | Discharge status: Skilled Nursing Facility | Payer: Medicaid Other | Attending: Emergency Medicine | Admitting: Emergency Medicine

## 2020-12-26 ENCOUNTER — Other Ambulatory Visit: Payer: Self-pay

## 2020-12-26 DIAGNOSIS — E119 Type 2 diabetes mellitus without complications: Secondary | ICD-10-CM | POA: Diagnosis not present

## 2020-12-26 DIAGNOSIS — K9423 Gastrostomy malfunction: Secondary | ICD-10-CM | POA: Insufficient documentation

## 2020-12-26 DIAGNOSIS — Z79899 Other long term (current) drug therapy: Secondary | ICD-10-CM | POA: Diagnosis not present

## 2020-12-26 DIAGNOSIS — Z8616 Personal history of COVID-19: Secondary | ICD-10-CM | POA: Diagnosis not present

## 2020-12-26 DIAGNOSIS — J45901 Unspecified asthma with (acute) exacerbation: Secondary | ICD-10-CM | POA: Diagnosis not present

## 2020-12-26 DIAGNOSIS — Z7984 Long term (current) use of oral hypoglycemic drugs: Secondary | ICD-10-CM | POA: Insufficient documentation

## 2020-12-26 DIAGNOSIS — Z7982 Long term (current) use of aspirin: Secondary | ICD-10-CM | POA: Diagnosis not present

## 2020-12-26 DIAGNOSIS — I10 Essential (primary) hypertension: Secondary | ICD-10-CM | POA: Insufficient documentation

## 2020-12-26 DIAGNOSIS — R6889 Other general symptoms and signs: Secondary | ICD-10-CM

## 2020-12-26 NOTE — ED Triage Notes (Addendum)
[facility] resident, G tube will not flush and redness around the site. Staff states he has not flushed for a few days. Patient does consume foods by mouth.

## 2020-12-26 NOTE — Discharge Instructions (Addendum)
Tamara [name]'s cube itself dressings required replacement of the stop-cock. She otherwise looks fine.

## 2020-12-26 NOTE — ED Notes (Signed)
[facility] notified of transportation, unknown ETA 

## 2020-12-26 NOTE — ED Provider Notes (Signed)
[facility]-EMERGENCY DEPT Provider Note   CSN: [id number] Arrival date & time: 12/26/20  1224     History No chief complaint on file.   Tamara [name] is a 60 y.o. female.  HPI     60 year old female comes in w/ cc of feeding tube issues. She has a G tube placement but reports able to ingest food orally. Pt has history of schizoaffective disorder, COVID-19, diabetes, metabolic encephalopathy.  She was sent from a nursing home because of G-tube being clogged.  Per EMS report, the G-tube has been clogged for 2 or 3 days now.  [facility] and after being transferred, nobody picked up the phone call and ultimately the phone call goes into voicemail -so we did not have the ability to communicate with them.  The patient informs that she does not have any nausea, vomiting or diarrhea and that she has been taking meals and medications orally.  Past Medical History:  Diagnosis Date  . Asthma   . Cognitive communication deficit   . COVID-19   . Diabetes mellitus without complication (HCC)   . Dysphagia, oropharyngeal phase   . Hypertension   . Major depressive disorder with single episode   . Memory changes   . Metabolic encephalopathy   . Muscle weakness (generalized)   . Other abnormalities of gait and mobility   . Renal disorder   . Schizoaffective disorder (HCC)   . Seizures (HCC)   . Unspecified sequelae of unspecified cerebrovascular disease   . Unspecified severe protein-calorie malnutrition (HCC)   . Unsteadiness on feet     Patient Active Problem List   Diagnosis Date Noted  . Schizoaffective disorder (HCC)   . Cognitive communication deficit   . Intertrochanteric fracture of right femur, closed, initial encounter (HCC) 10/28/2020  . Normocytic anemia 10/28/2020  . Moderate protein malnutrition (HCC) 10/28/2020  . Protein-calorie malnutrition, severe 04/05/2020  . AKI (acute kidney injury) (HCC) 04/02/2020  . Acute metabolic  encephalopathy 04/02/2020  . Acute encephalopathy 03/01/2020  . Asthma 03/01/2020  . Dehydration 03/01/2020  . Acute kidney injury superimposed on CKD (HCC) 03/01/2020  . Hyperammonemia (HCC) 03/01/2020  . Elevated serum hCG 03/01/2020  . Hypermagnesemia 03/01/2020  . Fall 03/01/2020  . Vomiting 03/01/2020  . Essential hypertension 03/01/2020  . AMS (altered mental status) 03/01/2020  . Chest pain 11/03/2018  . Asthma with acute exacerbation 01/14/2013  . Type 2 diabetes mellitus (HCC) 01/14/2013  . Dyslipidemia 01/14/2013    Past Surgical History:  Procedure Laterality Date  . INTRAMEDULLARY (IM) NAIL INTERTROCHANTERIC Right 10/29/2020   Procedure: INTRAMEDULLARY (IM) NAIL INTERTROCHANTRIC;  Surgeon: Yolonda Kidaogers, Jason Patrick, MD;  [facility];  Service: Orthopedics;  Laterality: Right;  . IR GASTROSTOMY TUBE MOD SED  04/19/2020     OB History   No obstetric history on file.     Family History  Problem Relation Age of Onset  . Hypertension Mother   . Hypertension Father     Social History   Tobacco Use  . Smoking status: Never Smoker  . Smokeless tobacco: Never Used  Substance Use Topics  . Alcohol use: Not Currently  . Drug use: Not Currently    Home Medications Prior to Admission medications   Medication Sig Start Date End Date Taking? Authorizing Provider  Acetaminophen 500 MG capsule Place 2 capsules (1,000 mg total) into feeding tube 3 (three) times daily as needed for pain. 04/23/20   Marguerita MerlesSheikh, Omair Latif, DO  albuterol (PROVENTIL HFA;VENTOLIN  HFA) 108 (90 BASE) MCG/ACT inhaler Inhale 2 puffs into the lungs every 6 (six) hours as needed for wheezing. 10/04/12   Johnson, Clanford L, MD  Ascorbic Acid (VITAMIN C) 1000 MG tablet Place 1,000 mg into feeding tube daily.    [provider]  aspirin (ASPIRIN CHILDRENS) 81 MG chewable tablet Place 1 tablet (81 mg total) into feeding tube daily. 04/23/20   Marguerita Merles Latif, DO  atorvastatin (LIPITOR) 80 MG  tablet Place 0.5 tablets (40 mg total) into feeding tube at bedtime. 04/23/20   Marguerita Merles Latif, DO  buPROPion (WELLBUTRIN SR) 150 MG 12 hr tablet Take 150 mg by mouth daily. Via tube    [provider]  docusate sodium (COLACE) 100 MG capsule Take 1 capsule (100 mg total) by mouth 2 (two) times daily. 11/02/20   Johnson, Clanford L, MD  Ergocalciferol (VITAMIN D2) 50 MCG (2000 UT) TABS Place 2,000 Units into feeding tube daily.    [provider]  escitalopram (LEXAPRO) 10 MG tablet Place 10 mg into feeding tube daily.    [provider]  Fluticasone-Salmeterol (ADVAIR) 250-50 MCG/DOSE AEPB Inhale 1 puff into the lungs 2 (two) times daily.    [provider]  folic acid (FOLVITE) 1 MG tablet Place 1 tablet (1 mg total) into feeding tube daily. 04/23/20   Marguerita Merles Latif, DO  levETIRAcetam (KEPPRA) 100 MG/ML solution Place 5 mLs (500 mg total) into feeding tube 2 (two) times daily. 04/23/20   Marguerita Merles Latif, DO  loratadine (CLARITIN) 10 MG tablet Place 1 tablet (10 mg total) into feeding tube daily. 04/23/20   Marguerita Merles Latif, DO  losartan (COZAAR) 100 MG tablet Place 1 tablet (100 mg total) into feeding tube daily. 04/23/20   Marguerita Merles Latif, DO  magnesium 30 MG tablet Place 1 tablet (30 mg total) into feeding tube daily. 04/23/20   Sheikh, Omair Latif, DO  melatonin 5 MG TABS Place 5 mg into feeding tube at bedtime.    [provider]  metFORMIN (GLUCOPHAGE) 1000 MG tablet Place 1 tablet (1,000 mg total) into feeding tube 2 (two) times daily with a meal. 04/23/20   Sheikh, Omair Latif, DO  MIRALAX 17 GM/SCOOP powder Place 17 g into feeding tube at bedtime. 04/23/20   Marguerita Merles Latif, DO  mirtazapine (REMERON) 15 MG tablet Place 15 mg into feeding tube at bedtime.    [provider]  montelukast (SINGULAIR) 10 MG tablet Place 1 tablet (10 mg total) into feeding tube at bedtime. 04/23/20   Marguerita Merles Latif, DO  Multiple Vitamin  (MULTIVITAMIN WITH MINERALS) TABS tablet Place 1 tablet into feeding tube daily. 04/23/20   Marguerita Merles Latif, DO  Nutritional Supplements (FEEDING SUPPLEMENT, OSMOLITE 1.5 CAL,) LIQD Place 1,000 mLs into feeding tube continuous. 11/02/20   Johnson, Clanford L, MD  Nutritional Supplements (FEEDING SUPPLEMENT, PROSOURCE TF,) liquid Place 45 mLs into feeding tube 3 (three) times daily. 11/02/20   Johnson, Clanford L, MD  omeprazole (PRILOSEC) 20 MG capsule Take 20 mg by mouth daily. Via tube    [provider]  Quercetin 250 MG TABS Give 250 mg by tube in the morning and at bedtime.    [provider]  risperiDONE (RISPERDAL) 1 MG tablet Place 1 mg into feeding tube at bedtime.    [provider]  thiamine 100 MG tablet Place 1 tablet (100 mg total) into feeding tube daily. 04/23/20   Marguerita Merles Latif, DO  traMADol (ULTRAM) 50 MG  tablet Take 1 tablet (50 mg total) by mouth every 6 (six) hours as needed for moderate pain. 10/29/20 10/29/21  Yolonda Kida, MD  Water For Irrigation, Sterile (FREE WATER) SOLN Place 200 mLs into feeding tube every 8 (eight) hours. 04/23/20   Marguerita Merles Latif, DO  zinc gluconate 50 MG tablet Place 50 mg into feeding tube daily.    [provider]    Allergies    Chlorhexidine  Review of Systems   Review of Systems  Constitutional: Negative for activity change.  Respiratory: Negative for shortness of breath.   Cardiovascular: Negative for chest pain.  Gastrointestinal: Negative for abdominal pain, diarrhea, nausea and vomiting.  Neurological: Negative for dizziness.  All other systems reviewed and are negative.   Physical Exam Updated Vital Signs BP (!) 145/92 (BP Location: Left Arm)   Pulse 72   Temp 98.8 F (37.1 C) (Oral)   Resp 14   SpO2 99%   Physical Exam Vitals and nursing note reviewed.  Constitutional:      Appearance: She is well-developed.  HENT:     Head: Normocephalic and atraumatic.   Cardiovascular:     Rate and Rhythm: Normal rate.  Pulmonary:     Effort: Pulmonary effort is normal.     Comments: G-tube appears to be firmly in place, there is no surrounding erythema or fluctuance, no drainage Abdominal:     General: Bowel sounds are normal.  Musculoskeletal:     Cervical back: Normal range of motion and neck supple.  Skin:    General: Skin is warm and dry.  Neurological:     Mental Status: She is alert and oriented to person, place, and time.     ED Results / Procedures / Treatments   Labs (all labs ordered are listed, but only abnormal results are displayed) Labs Reviewed - No data to display  EKG None  Radiology No results found.  Procedures .Foreign Body Removal  Date/Time: 12/26/2020 2:18 PM Performed by: Derwood Kaplan, MD Authorized by: Derwood Kaplan, MD  Consent: Verbal consent obtained. Risks and benefits: risks, benefits and alternatives were discussed Consent given by: patient Patient understanding: patient states understanding of the procedure being performed Patient identity confirmed: arm band Time out: Immediately prior to procedure a "time out" was called to verify the correct patient, procedure, equipment, support staff and site/side marked as required.  Sedation: Patient sedated: no  Patient restrained: no Complexity: simple Objects recovered: clots     Medications Ordered in ED Medications - No data to display  ED Course  I have reviewed the triage vital signs and the nursing notes.  Pertinent labs & imaging results that were available during my care of the patient were reviewed by me and considered in my medical decision making (see chart for details).    MDM Rules/Calculators/A&P                          60 year old comes in with chief complaint of G-tube clogged.  We were able to successfully relieve the clotting, and had to remove stopcock and will replace a new one.  Call the nursing home, but was unable  to reach anyone Give further history.  Patient denies any dizziness, nausea, vomiting and reports Is able to do eat and drink orally.  No labs indicated based on exam.  Final Clinical Impression(s) / ED Diagnoses Final diagnoses:  Complaint associated with gastric tube [facility])    Rx /  DC Orders ED Discharge Orders    None       Derwood Kaplan, MD 12/26/20 1422

## 2020-12-26 NOTE — ED Notes (Signed)
Placement of gtube verified via auscultation. Flushed easily without stopcock.

## 2021-01-12 ENCOUNTER — Emergency Department (HOSPITAL_COMMUNITY): Payer: Medicaid Other

## 2021-01-12 ENCOUNTER — Emergency Department (HOSPITAL_COMMUNITY)
Admission: EM | Admit: 2021-01-12 | Discharge: 2021-01-13 | Disposition: A | Discharge status: Home / Self Care | Payer: Medicaid Other | Attending: Emergency Medicine | Admitting: Emergency Medicine

## 2021-01-12 ENCOUNTER — Encounter (HOSPITAL_COMMUNITY): Payer: Self-pay | Admitting: Emergency Medicine

## 2021-01-12 DIAGNOSIS — J45909 Unspecified asthma, uncomplicated: Secondary | ICD-10-CM | POA: Insufficient documentation

## 2021-01-12 DIAGNOSIS — Z79899 Other long term (current) drug therapy: Secondary | ICD-10-CM | POA: Diagnosis not present

## 2021-01-12 DIAGNOSIS — Z7951 Long term (current) use of inhaled steroids: Secondary | ICD-10-CM | POA: Insufficient documentation

## 2021-01-12 DIAGNOSIS — I10 Essential (primary) hypertension: Secondary | ICD-10-CM | POA: Insufficient documentation

## 2021-01-12 DIAGNOSIS — E119 Type 2 diabetes mellitus without complications: Secondary | ICD-10-CM | POA: Diagnosis not present

## 2021-01-12 DIAGNOSIS — Z7984 Long term (current) use of oral hypoglycemic drugs: Secondary | ICD-10-CM | POA: Diagnosis not present

## 2021-01-12 DIAGNOSIS — Z8616 Personal history of COVID-19: Secondary | ICD-10-CM | POA: Diagnosis not present

## 2021-01-12 DIAGNOSIS — K9423 Gastrostomy malfunction: Secondary | ICD-10-CM

## 2021-01-12 DIAGNOSIS — Z7982 Long term (current) use of aspirin: Secondary | ICD-10-CM | POA: Diagnosis not present

## 2021-01-12 DIAGNOSIS — Z931 Gastrostomy status: Secondary | ICD-10-CM

## 2021-01-12 DIAGNOSIS — Z0189 Encounter for other specified special examinations: Secondary | ICD-10-CM

## 2021-01-12 LAB — CBC WITH DIFFERENTIAL/PLATELET
Abs Immature Granulocytes: 0.02 10*3/uL (ref 0.00–0.07)
Basophils Absolute: 0 10*3/uL (ref 0.0–0.1)
Basophils Relative: 0 %
Eosinophils Absolute: 0.3 10*3/uL (ref 0.0–0.5)
Eosinophils Relative: 3 %
HCT: 38.2 % (ref 36.0–46.0)
Hemoglobin: 12.4 g/dL (ref 12.0–15.0)
Immature Granulocytes: 0 %
Lymphocytes Relative: 47 %
Lymphs Abs: 3.7 10*3/uL (ref 0.7–4.0)
MCH: 28.9 pg (ref 26.0–34.0)
MCHC: 32.5 g/dL (ref 30.0–36.0)
MCV: 89 fL (ref 80.0–100.0)
Monocytes Absolute: 0.5 10*3/uL (ref 0.1–1.0)
Monocytes Relative: 7 %
Neutro Abs: 3.4 10*3/uL (ref 1.7–7.7)
Neutrophils Relative %: 43 %
Platelets: 320 10*3/uL (ref 150–400)
RBC: 4.29 MIL/uL (ref 3.87–5.11)
RDW: 13 % (ref 11.5–15.5)
WBC: 7.9 10*3/uL (ref 4.0–10.5)
nRBC: 0 % (ref 0.0–0.2)

## 2021-01-12 LAB — I-STAT CHEM 8, ED
BUN: 25 mg/dL — ABNORMAL HIGH (ref 6–20)
Calcium, Ion: 1.2 mmol/L (ref 1.15–1.40)
Chloride: 106 mmol/L (ref 98–111)
Creatinine, Ser: 0.8 mg/dL (ref 0.44–1.00)
Glucose, Bld: 94 mg/dL (ref 70–99)
HCT: 36 % (ref 36.0–46.0)
Hemoglobin: 12.2 g/dL (ref 12.0–15.0)
Potassium: 4 mmol/L (ref 3.5–5.1)
Sodium: 140 mmol/L (ref 135–145)
TCO2: 26 mmol/L (ref 22–32)

## 2021-01-12 MED ORDER — DIATRIZOATE MEGLUMINE & SODIUM 66-10 % PO SOLN
25.0000 mL | Freq: Once | ORAL | Status: AC
  Filled 2021-01-12: qty 30

## 2021-01-12 MED ORDER — DIATRIZOATE MEGLUMINE & SODIUM 66-10 % PO SOLN
ORAL | Status: AC
  Administered 2021-01-12: 25 mL via ORAL
  Filled 2021-01-12: qty 30

## 2021-01-12 NOTE — Discharge Instructions (Signed)
We have repositioned your gastrostomy tube during this visit.  Please call and follow-up closely with your gastroenterologist for further managements of your tube.

## 2021-01-12 NOTE — ED Triage Notes (Signed)
Pt here from Nursing home with c/o peg tube either coming out/dislodge or not working properly

## 2021-01-12 NOTE — ED Notes (Signed)
This RN attempted to contact [place] to provide report for patient that is returning, this RN spoke to [place], who attempted to transfer this RN to patient's nurse at facility, the call went to voicemail x 2.

## 2021-01-12 NOTE — ED Provider Notes (Signed)
[facility] Provider Note   CSN: [id number] Arrival date & time: 01/12/21  1803     History No chief complaint on file.   Tamara [name] is a 60 y.o. female.  The history is provided by the EMS personnel and medical records. The history is limited by the absence of a caregiver and a developmental delay. No language interpreter was used.     60 year old female significant history of schizophrenia, diabetes, malnutrition with PEG tube, brought here from nursing home via EMS with concerns of complication from PEG tube.  Unfortunately due to her cognitive communication deficit, I am unable to obtain much history from patient.  She does not appears to endorse any significant pain nausea or vomiting or fever.  However, history is limited.  I have reviewed patient's previous notes, she was last seen in the ER on 12/26/2020 for essentially the same presentation.  She was seen because feeding tube issue.  She is able to ingest food orally.  When she was last seen, it was noted that her G-tube was clogged and it was successfully irrigated.  The stopcock was replaced as well.  Previous provider was having difficulty reaching [facility], her nursing facility.  I have attempted to contact the facility but unable to reach anyone with exception of a voicemail option.  At this time patient without any complaints of nausea vomiting diarrhea.  Past Medical History:  Diagnosis Date  . Asthma   . Cognitive communication deficit   . COVID-19   . Diabetes mellitus without complication (HCC)   . Dysphagia, oropharyngeal phase   . Hypertension   . Major depressive disorder with single episode   . Memory changes   . Metabolic encephalopathy   . Muscle weakness (generalized)   . Other abnormalities of gait and mobility   . Renal disorder   . Schizoaffective disorder (HCC)   . Seizures (HCC)   . Unspecified sequelae of unspecified cerebrovascular disease   .  Unspecified severe protein-calorie malnutrition (HCC)   . Unsteadiness on feet     Patient Active Problem List   Diagnosis Date Noted  . Schizoaffective disorder (HCC)   . Cognitive communication deficit   . Intertrochanteric fracture of right femur, closed, initial encounter (HCC) 10/28/2020  . Normocytic anemia 10/28/2020  . Moderate protein malnutrition (HCC) 10/28/2020  . Protein-calorie malnutrition, severe 04/05/2020  . AKI (acute kidney injury) (HCC) 04/02/2020  . Acute metabolic encephalopathy 04/02/2020  . Acute encephalopathy 03/01/2020  . Asthma 03/01/2020  . Dehydration 03/01/2020  . Acute kidney injury superimposed on CKD (HCC) 03/01/2020  . Hyperammonemia (HCC) 03/01/2020  . Elevated serum hCG 03/01/2020  . Hypermagnesemia 03/01/2020  . Fall 03/01/2020  . Vomiting 03/01/2020  . Essential hypertension 03/01/2020  . AMS (altered mental status) 03/01/2020  . Chest pain 11/03/2018  . Asthma with acute exacerbation 01/14/2013  . Type 2 diabetes mellitus (HCC) 01/14/2013  . Dyslipidemia 01/14/2013    Past Surgical History:  Procedure Laterality Date  . INTRAMEDULLARY (IM) NAIL INTERTROCHANTERIC Right 10/29/2020   Procedure: INTRAMEDULLARY (IM) NAIL INTERTROCHANTRIC;  Surgeon: Yolonda Kidaogers, Jason Patrick, MD;  [facility];  Service: Orthopedics;  Laterality: Right;  . IR GASTROSTOMY TUBE MOD SED  04/19/2020     OB History   No obstetric history on file.     Family History  Problem Relation Age of Onset  . Hypertension Mother   . Hypertension Father     Social History   Tobacco Use  .  Smoking status: Never Smoker  . Smokeless tobacco: Never Used  Substance Use Topics  . Alcohol use: Not Currently  . Drug use: Not Currently    Home Medications Prior to Admission medications   Medication Sig Start Date End Date Taking? Authorizing Provider  Acetaminophen 500 MG capsule Place 2 capsules (1,000 mg total) into feeding tube 3 (three) times daily as needed for  pain. 04/23/20   Marguerita Merles Latif, DO  albuterol (PROVENTIL HFA;VENTOLIN HFA) 108 (90 BASE) MCG/ACT inhaler Inhale 2 puffs into the lungs every 6 (six) hours as needed for wheezing. 10/04/12   Johnson, Clanford L, MD  Ascorbic Acid (VITAMIN C) 1000 MG tablet Place 1,000 mg into feeding tube daily.    [provider]  aspirin (ASPIRIN CHILDRENS) 81 MG chewable tablet Place 1 tablet (81 mg total) into feeding tube daily. 04/23/20   Marguerita Merles Latif, DO  atorvastatin (LIPITOR) 80 MG tablet Place 0.5 tablets (40 mg total) into feeding tube at bedtime. 04/23/20   Marguerita Merles Latif, DO  buPROPion (WELLBUTRIN SR) 150 MG 12 hr tablet Take 150 mg by mouth daily. Via tube    [provider]  docusate sodium (COLACE) 100 MG capsule Take 1 capsule (100 mg total) by mouth 2 (two) times daily. 11/02/20   Johnson, Clanford L, MD  Ergocalciferol (VITAMIN D2) 50 MCG (2000 UT) TABS Place 2,000 Units into feeding tube daily.    [provider]  escitalopram (LEXAPRO) 10 MG tablet Place 10 mg into feeding tube daily.    [provider]  Fluticasone-Salmeterol (ADVAIR) 250-50 MCG/DOSE AEPB Inhale 1 puff into the lungs 2 (two) times daily.    [provider]  folic acid (FOLVITE) 1 MG tablet Place 1 tablet (1 mg total) into feeding tube daily. 04/23/20   Marguerita Merles Latif, DO  levETIRAcetam (KEPPRA) 100 MG/ML solution Place 5 mLs (500 mg total) into feeding tube 2 (two) times daily. 04/23/20   Marguerita Merles Latif, DO  loratadine (CLARITIN) 10 MG tablet Place 1 tablet (10 mg total) into feeding tube daily. 04/23/20   Marguerita Merles Latif, DO  losartan (COZAAR) 100 MG tablet Place 1 tablet (100 mg total) into feeding tube daily. 04/23/20   Marguerita Merles Latif, DO  magnesium 30 MG tablet Place 1 tablet (30 mg total) into feeding tube daily. 04/23/20   Sheikh, Omair Latif, DO  melatonin 5 MG TABS Place 5 mg into feeding tube at bedtime.    [provider]  metFORMIN  (GLUCOPHAGE) 1000 MG tablet Place 1 tablet (1,000 mg total) into feeding tube 2 (two) times daily with a meal. 04/23/20   Sheikh, Omair Latif, DO  MIRALAX 17 GM/SCOOP powder Place 17 g into feeding tube at bedtime. 04/23/20   Marguerita Merles Latif, DO  mirtazapine (REMERON) 15 MG tablet Place 15 mg into feeding tube at bedtime.    [provider]  montelukast (SINGULAIR) 10 MG tablet Place 1 tablet (10 mg total) into feeding tube at bedtime. 04/23/20   Marguerita Merles Latif, DO  Multiple Vitamin (MULTIVITAMIN WITH MINERALS) TABS tablet Place 1 tablet into feeding tube daily. 04/23/20   Marguerita Merles Latif, DO  Nutritional Supplements (FEEDING SUPPLEMENT, OSMOLITE 1.5 CAL,) LIQD Place 1,000 mLs into feeding tube continuous. 11/02/20   Johnson, Clanford L, MD  Nutritional Supplements (FEEDING SUPPLEMENT, PROSOURCE TF,) liquid Place 45 mLs into feeding tube 3 (three) times daily. 11/02/20   Johnson, Clanford L, MD  omeprazole (PRILOSEC) 20 MG capsule Take 20 mg by  mouth daily. Via tube    [provider]  Quercetin 250 MG TABS Give 250 mg by tube in the morning and at bedtime.    [provider]  risperiDONE (RISPERDAL) 1 MG tablet Place 1 mg into feeding tube at bedtime.    [provider]  thiamine 100 MG tablet Place 1 tablet (100 mg total) into feeding tube daily. 04/23/20   Marguerita Merles Latif, DO  traMADol (ULTRAM) 50 MG tablet Take 1 tablet (50 mg total) by mouth every 6 (six) hours as needed for moderate pain. 10/29/20 10/29/21  Yolonda Kida, MD  Water For Irrigation, Sterile (FREE WATER) SOLN Place 200 mLs into feeding tube every 8 (eight) hours. 04/23/20   Marguerita Merles Latif, DO  zinc gluconate 50 MG tablet Place 50 mg into feeding tube daily.    [provider]    Allergies    Chlorhexidine  Review of Systems   Review of Systems  Unable to perform ROS: Psychiatric disorder    Physical Exam Updated Vital Signs BP 113/82   Pulse 69   Temp 99.1 F  (37.3 C)   Resp 12   SpO2 100%   Physical Exam Vitals and nursing note reviewed.  Constitutional:      General: She is not in acute distress.    Appearance: She is well-developed.  HENT:     Head: Atraumatic.  Eyes:     Conjunctiva/sclera: Conjunctivae normal.  Cardiovascular:     Rate and Rhythm: Normal rate and regular rhythm.     Pulses: Normal pulses.     Heart sounds: Normal heart sounds.  Pulmonary:     Effort: Pulmonary effort is normal.     Breath sounds: Normal breath sounds.  Abdominal:     Palpations: Abdomen is soft.     Comments: PEG tube appears in place but significantly retracted.  There are some food particles around the tube.  And no signs of infection.  Abdomen otherwise soft nontender.  Bowel sounds present.  Musculoskeletal:     Cervical back: Neck supple.  Skin:    Findings: No rash.  Neurological:     Mental Status: She is alert. Mental status is at baseline.     ED Results / Procedures / Treatments   Labs (all labs ordered are listed, but only abnormal results are displayed) Labs Reviewed  I-STAT CHEM 8, ED - Abnormal; Notable for the following components:      Result Value   BUN 25 (*)    All other components within normal limits  CBC WITH DIFFERENTIAL/PLATELET    EKG None  Radiology CT ABDOMEN PELVIS WO CONTRAST  Result Date: 01/12/2021 CLINICAL DATA:  Evaluate gastrostomy catheter placement EXAM: CT ABDOMEN AND PELVIS WITHOUT CONTRAST TECHNIQUE: Multidetector CT imaging of the abdomen and pelvis was performed following the standard protocol without IV contrast. COMPARISON:  04/19/2020 FINDINGS: Lower chest: No acute abnormality. Hepatobiliary: No focal liver abnormality is seen. No gallstones, gallbladder wall thickening, or biliary dilatation. Pancreas: Unremarkable. No pancreatic ductal dilatation or surrounding inflammatory changes. Spleen: Normal in size without focal abnormality. Adrenals/Urinary Tract: Adrenal glands are within normal  limits. Kidneys demonstrate no renal calculi or obstructive changes. The bladder is well distended. Stomach/Bowel: Colon demonstrates scattered fecal material without obstructive change. The appendix is not well visualized and may have been surgically removed. No inflammatory changes are seen. The stomach is distended with air and fluid. A gastrostomy catheter is identified which extends into the stomach and subsequently  through the duodenal bulb and into the mid jejunum. This has likely been propelled by peristalsis into the proximal small bowel. The extrinsic portion of the catheter has been drawn internally. Vascular/Lymphatic: Aortic atherosclerosis. No enlarged abdominal or pelvic lymph nodes. Reproductive: Calcified uterine fibroids are noted. No adnexal mass is noted. Other: No abdominal wall hernia or abnormality. No abdominopelvic ascites. Musculoskeletal: Postoperative change in the proximal right femur is noted. Degenerative changes of the lumbar spine are seen. IMPRESSION: Gastrostomy catheter tip now lies within the proximal to mid jejunum propelled by peristalsis with the entire catheter drawn to the skin surface and into the gastrostomy tract. This should be withdrawn under fluoroscopic guidance in the interventional suite to allow for proper positioning of the catheter. Additionally the overlying retention device could be manipulated as well. No other focal abnormality is noted. Electronically Signed   By: Alcide Clever M.D.   On: 01/12/2021 20:51   DG ABDOMEN PEG TUBE LOCATION  Result Date: 01/12/2021 CLINICAL DATA:  Peg tube injection. EXAM: ABDOMEN - 1 VIEW COMPARISON:  None. FINDINGS: The bowel gas pattern is normal. A percutaneous gastrostomy tube is seen with its distal tip and insufflator bulb overlying the right upper quadrant. Radiopaque contrast is seen within the lumen of the gastrostomy tube as well as within a loop of duodenum within the left upper quadrant. No radio-opaque calculi or  other significant radiographic abnormality are seen. IMPRESSION: Percutaneous gastrostomy tube positioning, as described above. Electronically Signed   By: Aram Candela M.D.   On: 01/12/2021 21:58   DG Abd Portable 1V  Result Date: 01/12/2021 CLINICAL DATA:  Peg tube problem EXAM: PORTABLE ABDOMEN - 1 VIEW COMPARISON:  Fluoroscopic image 04/19/2020 FINDINGS: Right lower quadrant positioning of the gastrostomy. Stopcock and external tubing project over the mid gastric region. The gas pattern is unobstructed. IMPRESSION: Indeterminate positioning of gastrostomy tube which appears within the right pelvis, question intraperitoneal location. CT recommended for further evaluation. Critical Value/emergent results were called by telephone at the time of interpretation on 01/12/2021 at 7:46 pm to provider [facility] , who verbally acknowledged these results. Electronically Signed   By: Jasmine Pang M.D.   On: 01/12/2021 19:46    Procedures Procedures   Medications Ordered in ED Medications  diatrizoate meglumine-sodium (GASTROGRAFIN) 66-10 % solution 25 mL (has no administration in time range)  diatrizoate meglumine-sodium (GASTROGRAFIN) 66-10 % solution (has no administration in time range)    ED Course  I have reviewed the triage vital signs and the nursing notes.  Pertinent labs & imaging results that were available during my care of the patient were reviewed by me and considered in my medical decision making (see chart for details).    MDM Rules/Calculators/A&P                          BP (!) 158/94   Pulse 76   Temp 99.1 F (37.3 C)   Resp 16   SpO2 100%   Final Clinical Impression(s) / ED Diagnoses Final diagnoses:  Encounter for imaging study to confirm gastrojejunal (GJ) tube placement  PEG tube malfunction (HCC)    Rx / DC Orders ED Discharge Orders    None     7:03 PM Patient sent here from nursing home facility due to her PEG tube either being clogged or is out of  place.  I was unable to reach out to anyone from the nursing facility to clarify it as patient  is a poor historian secondary to her psychiatric illness and baseline mental handicap.  Plan to obtain a KUB, likely will try to irrigate PEG tube once we can confirm placement.  Care discussed with Dr. Audley Hose.   7:47 PM KUB obtained and radiologist noted that the tube appears to be out of place as it appears to be in the small bowel based on position.  She recommends an abdominal pelvis CT scan without contrast for better confirmation of the location of the PEG tube.  Will order  10:15 PM CT scan demonstrate that the gastrostomy tube catheter tip Within the proximal to mid jejunum propelled by peristalsis with the entire catheter withdrawn to the skin surface and into the gastrostomy tract.  We have to manipulate the tube and was able to pull it back out into proper position.  A confirmatory KUB with Gastrografin did demonstrate appropriate placement.  Tube flushed appropriately.  Will discharge back to facility and recommend outpatient follow-up with GI.  Family were notified of result.   Fayrene Helper, PA-C 01/12/21 2218    Cheryll Cockayne, MD 01/14/21 [phone]

## 2022-01-24 IMAGING — DX DG FOOT COMPLETE 3+V*L*
3 series · 3 of 3 positions shown · non-contrast
Comparison: None.

CLINICAL DATA: First digit pain

EXAM:
LEFT FOOT - COMPLETE 3+ VIEW

[foot ap]
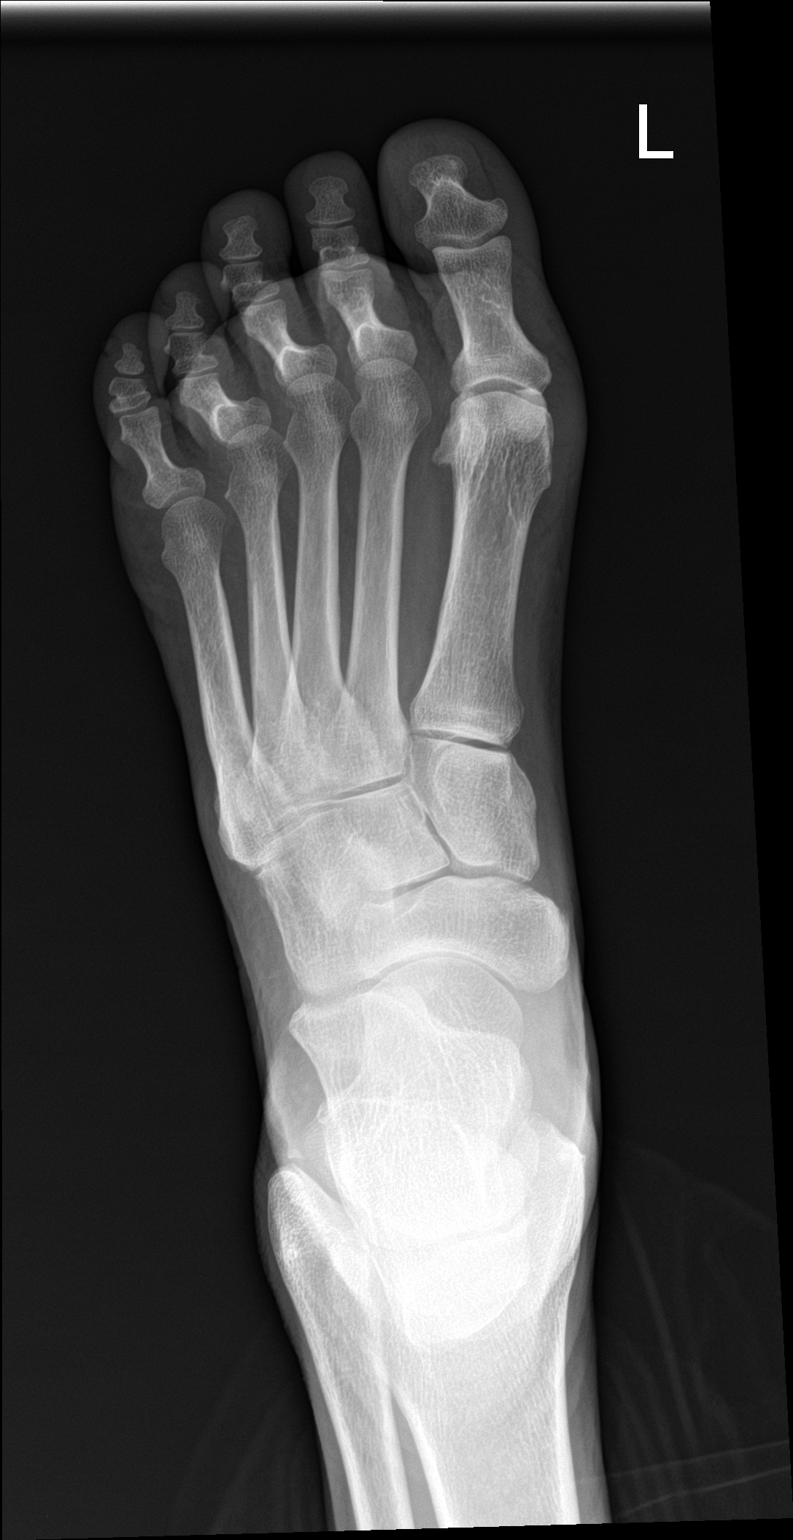

[foot obl]
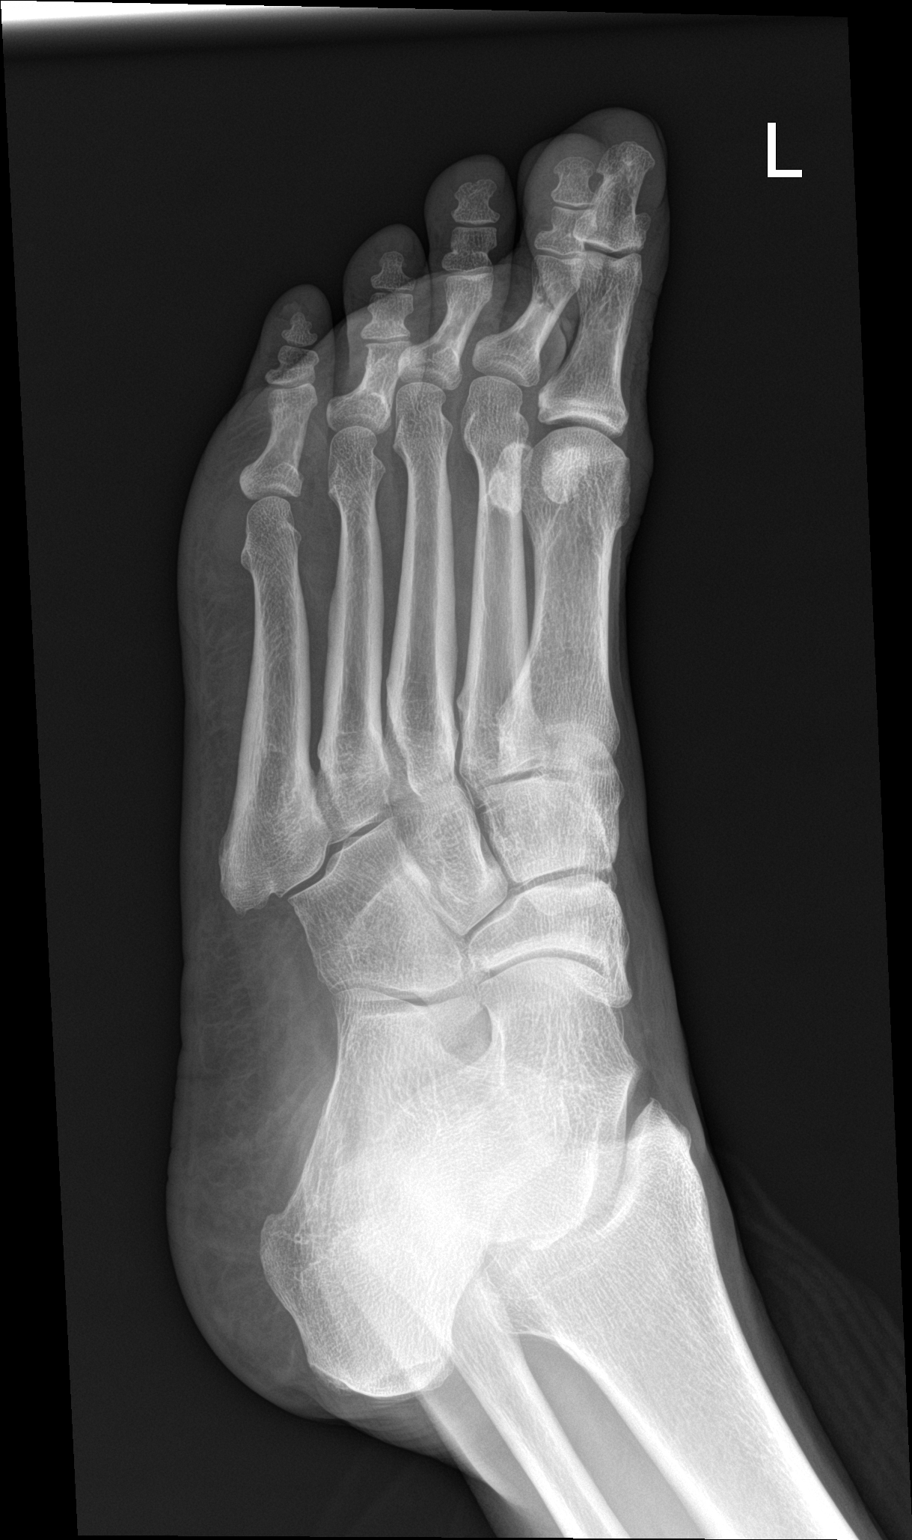

[foot lat]
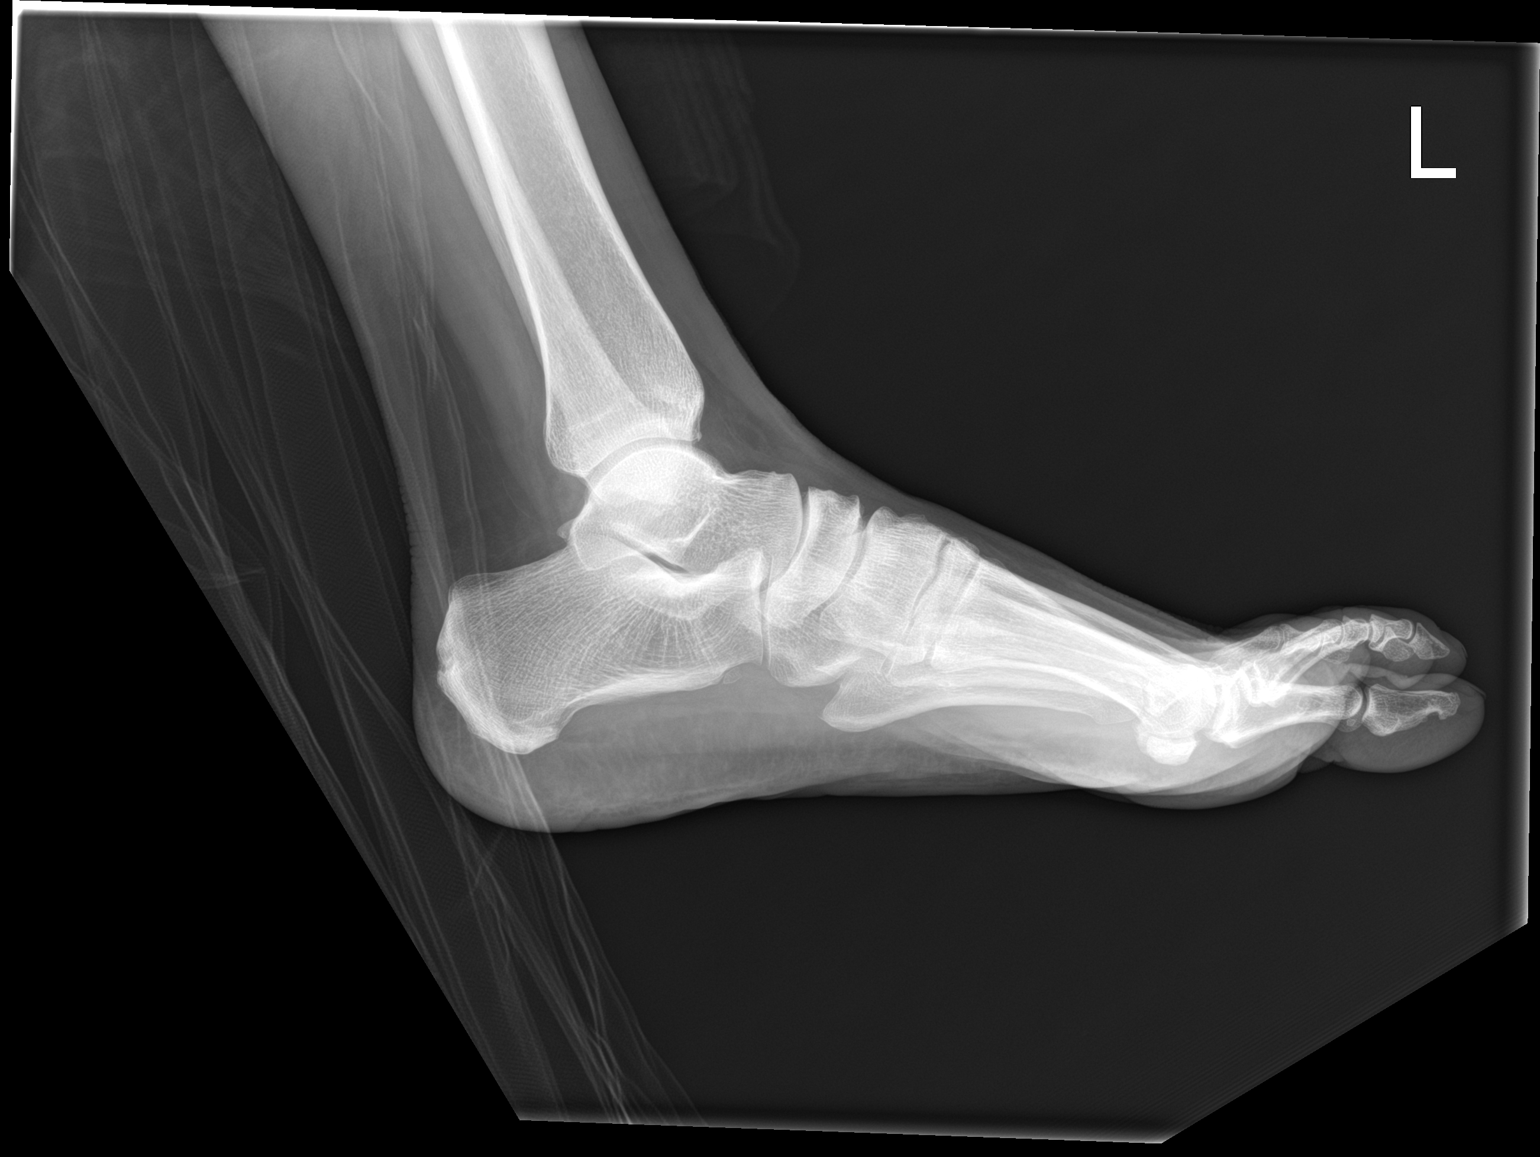

[3 of 3 positions shown; findings below may reference images not displayed]

FINDINGS: There is no evidence of fracture or dislocation. There is no
evidence of arthropathy or other focal bone abnormality. Soft
tissues are unremarkable.
IMPRESSION: Negative.

## 2022-02-27 IMAGING — XA IR PERC PLACEMENT GASTROSTOMY
2 series · 3 of 3 positions shown · non-contrast
Comparison: none

CLINICAL DATA: Stroke, needs enteral feeding support

EXAM:
PERC PLACEMENT GASTROSTOMY
FLUOROSCOPY TIME:  48 seconds; 2 mGy
TECHNIQUE: The procedure, risks, benefits, and alternatives were explained to
the daughter. Questions regarding the procedure were encouraged and
answered. The daughter understands and consents to the procedure.

[Series 1: fl (-) angio · 1 of 1 slices shown (1 of 2)]
[im 1/1]
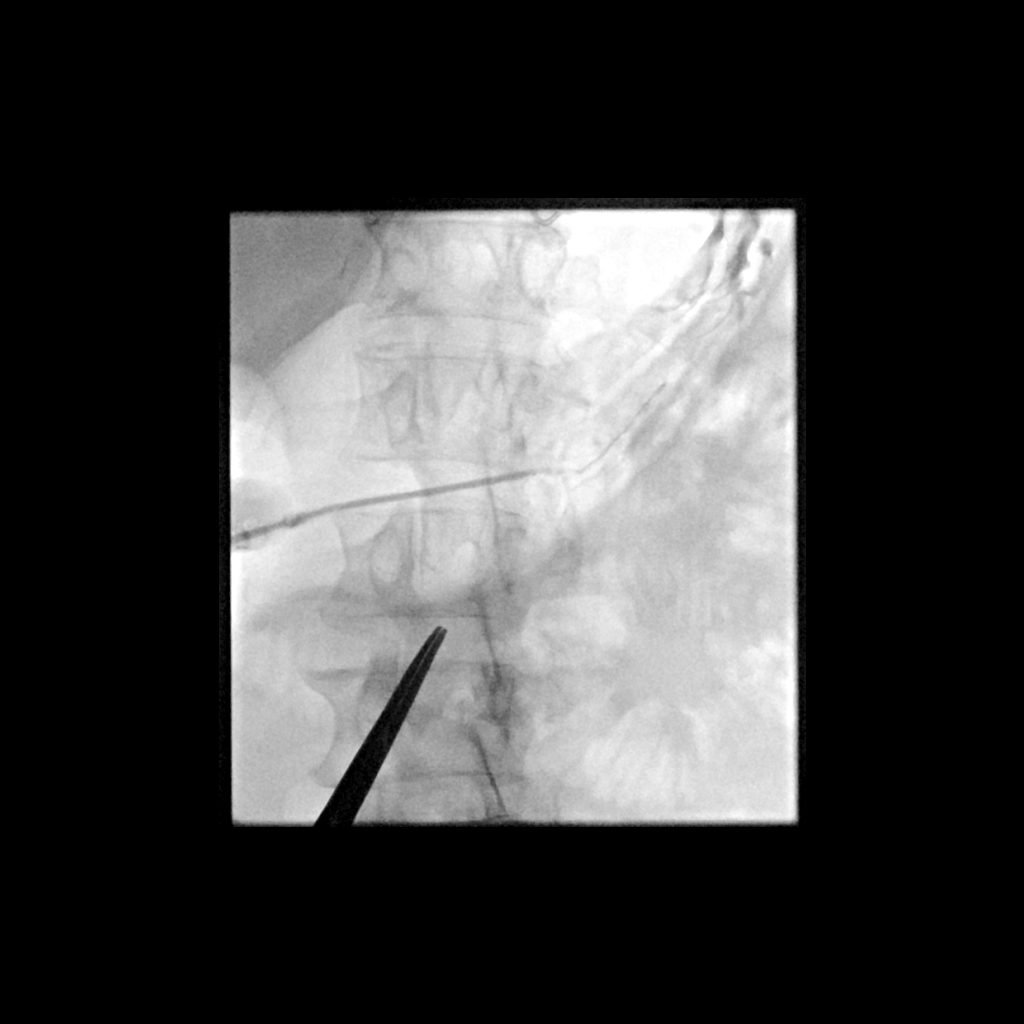

[Series 2: fl (-) angio · 2 of 2 slices shown (2 of 2)]
[im 1/2]
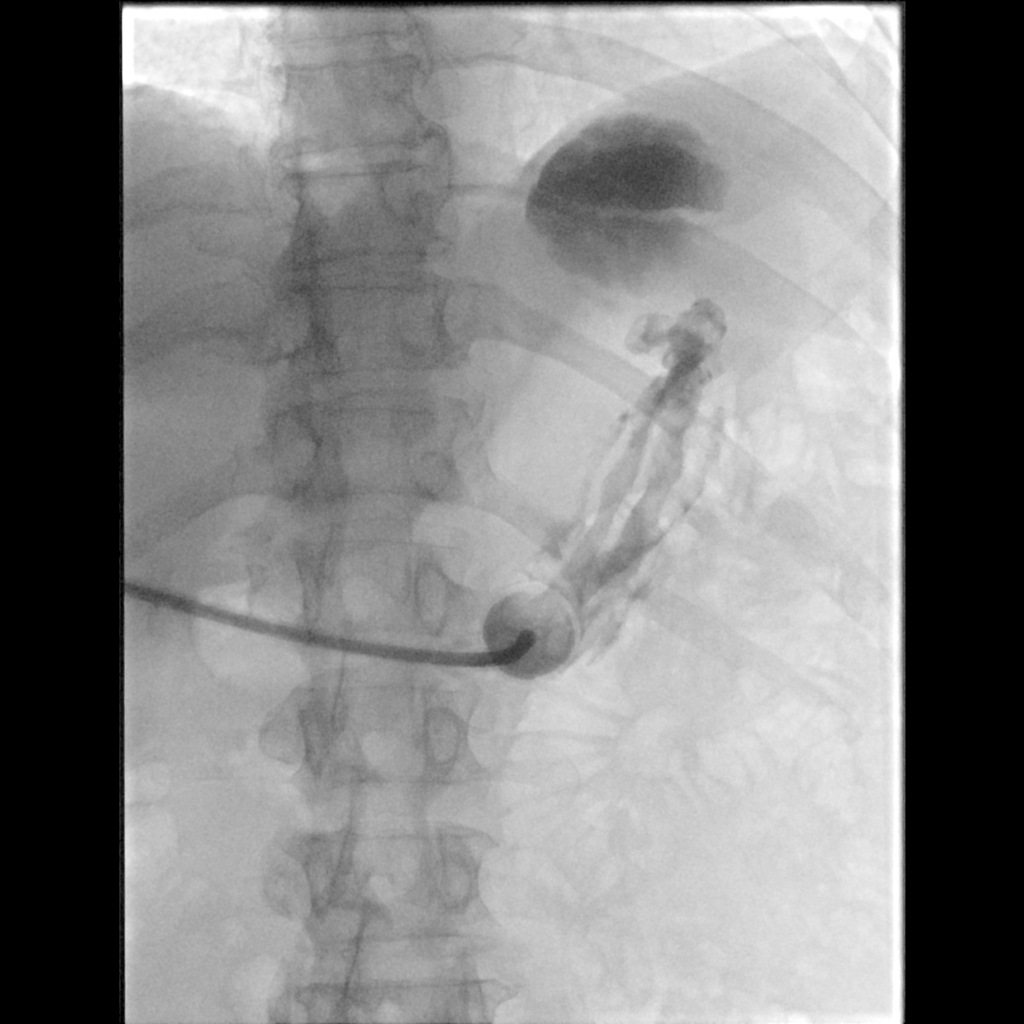
[im 2/2]
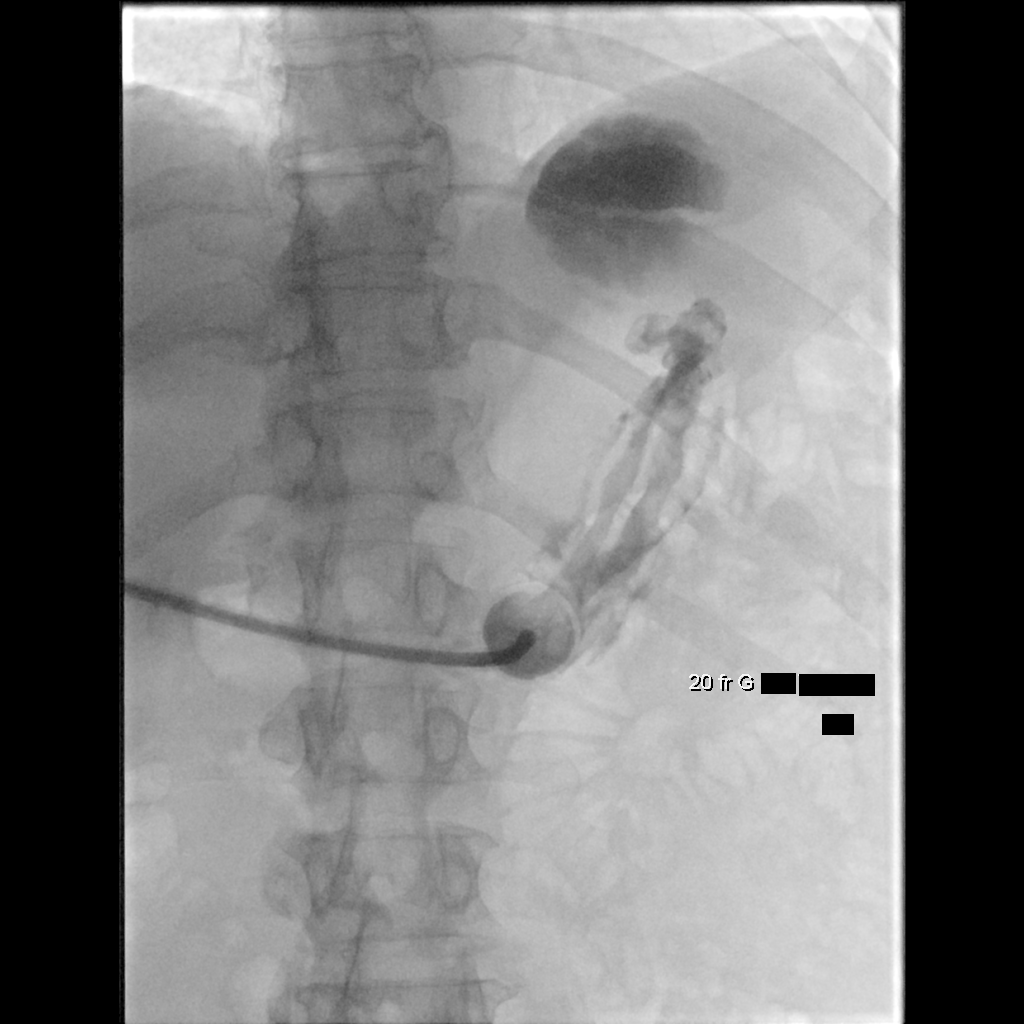

[3 of 3 positions shown; findings below may reference images not displayed]

As antibiotic prophylaxis, cefazolin 2 g was ordered pre-procedure
and administered intravenously within one hour of incision.

A safe percutaneous approach was confirmed on recent CT abdomen. A 5
French angiographic catheter was placed as orogastric tube. The
upper abdomen was prepped with Betadine, draped in usual sterile
fashion, and infiltrated locally with 1% lidocaine.

Intravenous Fentanyl 13mcg and Versed 1.5mg were administered as
conscious sedation during continuous monitoring of the patient's
level of consciousness and physiological / cardiorespiratory status
by the radiology RN, with a total moderate sedation time of 12
minutes. Stomach was insufflated using air through the orogastric
tube. An 18 French sheath needle was advanced percutaneously into
the gastric lumen under fluoroscopy. Gas could be aspirated and a
small contrast injection confirmed intraluminal spread. The sheath
was exchanged over a guidewire for a 9 French vascular sheath,
through which the snare device was advanced and used to snare a
guidewire passed through the orogastric tube. This was withdrawn,
and the snare attached to the 20 French pull-through gastrostomy
tube, which was advanced antegrade, positioned with the internal
bumper securing the anterior gastric wall to the anterior abdominal
wall. Small contrast injection confirms appropriate positioning. The
external bumper was applied and the catheter was flushed.

COMPLICATIONS:
COMPLICATIONS
none
IMPRESSION: 1. Technically successful 20 French pull-through gastrostomy
placement under fluoroscopy.

## 2022-05-17 ENCOUNTER — Emergency Department (HOSPITAL_COMMUNITY): Payer: Medicaid Other

## 2022-05-17 ENCOUNTER — Emergency Department (HOSPITAL_COMMUNITY)
Admission: EM | Admit: 2022-05-17 | Discharge: 2022-05-17 | Disposition: A | Discharge status: Home / Self Care | Payer: Medicaid Other | Attending: Emergency Medicine | Admitting: Emergency Medicine

## 2022-05-17 ENCOUNTER — Other Ambulatory Visit: Payer: Self-pay

## 2022-05-17 DIAGNOSIS — Z7982 Long term (current) use of aspirin: Secondary | ICD-10-CM | POA: Insufficient documentation

## 2022-05-17 DIAGNOSIS — K9423 Gastrostomy malfunction: Secondary | ICD-10-CM | POA: Insufficient documentation

## 2022-05-17 DIAGNOSIS — T85528A Displacement of other gastrointestinal prosthetic devices, implants and grafts, initial encounter: Secondary | ICD-10-CM

## 2022-05-17 MED ORDER — IOPAMIDOL (ISOVUE-300) INJECTION 61%
50.0000 mL | Freq: Once | INTRAVENOUS | Status: AC | PRN
  Administered 2022-05-17: 30 mL

## 2022-05-17 NOTE — ED Provider Notes (Signed)
[facility] DEPT Provider Note   CSN: [id number] Arrival date & time: 05/17/22  5643     History {Add pertinent medical, surgical, social history, OB history to HPI:1} Chief Complaint  Patient presents with   GI Problem    Tamara [name] is a 61 y.o. female.  61 year old female with a history of a G-tube in place the presents the ED pathology 2.  Patient is nonverbal not able to get history.  Per nursing facility she has [place].  Unclear on how it came out.  Unclear on when it was last seen in.   GI Problem       Home Medications Prior to Admission medications   Medication Sig Start Date End Date Taking? Authorizing Provider  Acetaminophen 500 MG capsule Place 2 capsules (1,000 mg total) into feeding tube 3 (three) times daily as needed for pain. 04/23/20   Raiford Noble Latif, DO  albuterol (PROVENTIL HFA;VENTOLIN HFA) 108 (90 BASE) MCG/ACT inhaler Inhale 2 puffs into the lungs every 6 (six) hours as needed for wheezing. 10/04/12   Johnson, Clanford L, MD  Ascorbic Acid (VITAMIN C) 1000 MG tablet Place 1,000 mg into feeding tube daily.    [provider]  aspirin (ASPIRIN CHILDRENS) 81 MG chewable tablet Place 1 tablet (81 mg total) into feeding tube daily. 04/23/20   Raiford Noble Latif, DO  atorvastatin (LIPITOR) 80 MG tablet Place 0.5 tablets (40 mg total) into feeding tube at bedtime. 04/23/20   Raiford Noble Latif, DO  buPROPion (WELLBUTRIN SR) 150 MG 12 hr tablet Take 150 mg by mouth daily. Via tube    [provider]  docusate sodium (COLACE) 100 MG capsule Take 1 capsule (100 mg total) by mouth 2 (two) times daily. 11/02/20   Johnson, Clanford L, MD  Ergocalciferol (VITAMIN D2) 50 MCG (2000 UT) TABS Place 2,000 Units into feeding tube daily.    [provider]  escitalopram (LEXAPRO) 10 MG tablet Place 10 mg into feeding tube daily.    [provider]  Fluticasone-Salmeterol (ADVAIR) 250-50 MCG/DOSE AEPB  Inhale 1 puff into the lungs 2 (two) times daily.    [provider]  folic acid (FOLVITE) 1 MG tablet Place 1 tablet (1 mg total) into feeding tube daily. 04/23/20   Raiford Noble Latif, DO  levETIRAcetam (KEPPRA) 100 MG/ML solution Place 5 mLs (500 mg total) into feeding tube 2 (two) times daily. 04/23/20   Raiford Noble Latif, DO  loratadine (CLARITIN) 10 MG tablet Place 1 tablet (10 mg total) into feeding tube daily. 04/23/20   Raiford Noble Latif, DO  losartan (COZAAR) 100 MG tablet Place 1 tablet (100 mg total) into feeding tube daily. 04/23/20   Raiford Noble Latif, DO  magnesium 30 MG tablet Place 1 tablet (30 mg total) into feeding tube daily. 04/23/20   Sheikh, Omair Latif, DO  melatonin 5 MG TABS Place 5 mg into feeding tube at bedtime.    [provider]  metFORMIN (GLUCOPHAGE) 1000 MG tablet Place 1 tablet (1,000 mg total) into feeding tube 2 (two) times daily with a meal. 04/23/20   Sheikh, Omair Latif, DO  MIRALAX 17 GM/SCOOP powder Place 17 g into feeding tube at bedtime. 04/23/20   Raiford Noble Latif, DO  mirtazapine (REMERON) 15 MG tablet Place 15 mg into feeding tube at bedtime.    [provider]  montelukast (SINGULAIR) 10 MG tablet Place 1 tablet (10 mg total) into feeding tube at bedtime. 04/23/20   Alfredia Ferguson,  Heloise Beecham, DO  Multiple Vitamin (MULTIVITAMIN WITH MINERALS) TABS tablet Place 1 tablet into feeding tube daily. 04/23/20   Marguerita Merles Latif, DO  Nutritional Supplements (FEEDING SUPPLEMENT, OSMOLITE 1.5 CAL,) LIQD Place 1,000 mLs into feeding tube continuous. 11/02/20   Johnson, Clanford L, MD  Nutritional Supplements (FEEDING SUPPLEMENT, PROSOURCE TF,) liquid Place 45 mLs into feeding tube 3 (three) times daily. 11/02/20   Johnson, Clanford L, MD  omeprazole (PRILOSEC) 20 MG capsule Take 20 mg by mouth daily. Via tube    [provider]  Quercetin 250 MG TABS Give 250 mg by tube in the morning and at bedtime.    [provider]   risperiDONE (RISPERDAL) 1 MG tablet Place 1 mg into feeding tube at bedtime.    [provider]  thiamine 100 MG tablet Place 1 tablet (100 mg total) into feeding tube daily. 04/23/20   Marguerita Merles Latif, DO  Water For Irrigation, Sterile (FREE WATER) SOLN Place 200 mLs into feeding tube every 8 (eight) hours. 04/23/20   Marguerita Merles Latif, DO  zinc gluconate 50 MG tablet Place 50 mg into feeding tube daily.    [provider]      Allergies    Chlorhexidine    Review of Systems   Review of Systems  Physical Exam Updated Vital Signs BP 124/89 (BP Location: Left Arm)   Pulse 74   Temp 99.2 F (37.3 C) (Oral)   Resp 19   Ht 5\' 7"  (1.702 m)   Wt 44.3 kg   SpO2 100%   BMI 15.30 kg/m  Physical Exam Vitals and nursing note reviewed.  Constitutional:      Appearance: She is well-developed.  HENT:     Head: Normocephalic and atraumatic.  Cardiovascular:     Rate and Rhythm: Normal rate and regular rhythm.  Pulmonary:     Effort: No respiratory distress.     Breath sounds: No stridor.  Abdominal:     General: There is no distension.  Musculoskeletal:     Cervical back: Normal range of motion.  Neurological:     Mental Status: She is alert. Mental status is at baseline.     ED Results / Procedures / Treatments   Labs (all labs ordered are listed, but only abnormal results are displayed) Labs Reviewed - No data to display  EKG None  Radiology No results found.  Procedures Procedures    Medications Ordered in ED Medications - No data to display  ED Course/ Medical Decision Making/ A&P                           Medical Decision Making On initial evaluation, nursing had already replaced the G-tube without complications.  We will get a Gastrografin study to ensure in the appropriate position and can likely be discharged as there is no evidence of dehydration, so no indication for labs.  ***  {Document critical care time when  appropriate:1} {Document review of labs and clinical decision tools ie heart score, Chads2Vasc2 etc:1}  {Document your independent review of radiology images, and any outside records:1} {Document your discussion with family members, caretakers, and with consultants:1} {Document social determinants of health affecting pt's care:1} {Document your decision making why or why not admission, treatments were needed:1} Final Clinical Impression(s) / ED Diagnoses Final diagnoses:  None    Rx / DC Orders ED Discharge Orders     None

## 2022-05-17 NOTE — ED Notes (Signed)
Attempted to call report 2 more times with no answer

## 2022-05-17 NOTE — ED Notes (Signed)
Attempted to call report to facility no answer 2nd attempt, first attempt with answer then worker hung up, will try again

## 2022-05-17 NOTE — ED Triage Notes (Signed)
BIBA from [facility] home for pulling out PEG tube

## 2022-08-12 ENCOUNTER — Observation Stay (HOSPITAL_COMMUNITY)
Admission: EM | Admit: 2022-08-12 | Discharge: 2022-08-16 | Disposition: A | Discharge status: Skilled Nursing Facility | Payer: Medicaid Other | Attending: Family Medicine | Admitting: Family Medicine

## 2022-08-12 DIAGNOSIS — K9429 Other complications of gastrostomy: Secondary | ICD-10-CM | POA: Diagnosis present

## 2022-08-12 DIAGNOSIS — Z7982 Long term (current) use of aspirin: Secondary | ICD-10-CM | POA: Insufficient documentation

## 2022-08-12 DIAGNOSIS — R1310 Dysphagia, unspecified: Secondary | ICD-10-CM | POA: Insufficient documentation

## 2022-08-12 DIAGNOSIS — Z79899 Other long term (current) drug therapy: Secondary | ICD-10-CM | POA: Diagnosis not present

## 2022-08-12 DIAGNOSIS — J45909 Unspecified asthma, uncomplicated: Secondary | ICD-10-CM | POA: Diagnosis not present

## 2022-08-12 DIAGNOSIS — Z431 Encounter for attention to gastrostomy: Secondary | ICD-10-CM

## 2022-08-12 DIAGNOSIS — I10 Essential (primary) hypertension: Secondary | ICD-10-CM | POA: Diagnosis present

## 2022-08-12 DIAGNOSIS — F259 Schizoaffective disorder, unspecified: Secondary | ICD-10-CM | POA: Diagnosis present

## 2022-08-12 DIAGNOSIS — E119 Type 2 diabetes mellitus without complications: Secondary | ICD-10-CM | POA: Diagnosis not present

## 2022-08-12 DIAGNOSIS — Z8616 Personal history of COVID-19: Secondary | ICD-10-CM | POA: Diagnosis not present

## 2022-08-12 DIAGNOSIS — D649 Anemia, unspecified: Secondary | ICD-10-CM | POA: Diagnosis present

## 2022-08-12 DIAGNOSIS — E785 Hyperlipidemia, unspecified: Secondary | ICD-10-CM | POA: Diagnosis present

## 2022-08-12 DIAGNOSIS — Z931 Gastrostomy status: Secondary | ICD-10-CM

## 2022-08-12 DIAGNOSIS — Z7984 Long term (current) use of oral hypoglycemic drugs: Secondary | ICD-10-CM | POA: Insufficient documentation

## 2022-08-12 DIAGNOSIS — T85528A Displacement of other gastrointestinal prosthetic devices, implants and grafts, initial encounter: Secondary | ICD-10-CM

## 2022-08-12 NOTE — ED Triage Notes (Signed)
Pt BIB by EMS from [facility]. Staff reports finding pt tube missing during dressing change. Vitals stable

## 2022-08-13 ENCOUNTER — Encounter (HOSPITAL_COMMUNITY): Payer: Self-pay | Admitting: Physician Assistant

## 2022-08-13 DIAGNOSIS — Z431 Encounter for attention to gastrostomy: Secondary | ICD-10-CM | POA: Diagnosis not present

## 2022-08-13 LAB — CBC WITH DIFFERENTIAL/PLATELET
Abs Immature Granulocytes: 0.02 K/uL (ref 0.00–0.07)
Basophils Absolute: 0 K/uL (ref 0.0–0.1)
Basophils Relative: 0 %
Eosinophils Absolute: 0.3 K/uL (ref 0.0–0.5)
Eosinophils Relative: 3 %
HCT: 36.3 % (ref 36.0–46.0)
Hemoglobin: 11.8 g/dL — ABNORMAL LOW (ref 12.0–15.0)
Immature Granulocytes: 0 %
Lymphocytes Relative: 36 %
Lymphs Abs: 3.2 K/uL (ref 0.7–4.0)
MCH: 29.2 pg (ref 26.0–34.0)
MCHC: 32.5 g/dL (ref 30.0–36.0)
MCV: 89.9 fL (ref 80.0–100.0)
Monocytes Absolute: 0.5 K/uL (ref 0.1–1.0)
Monocytes Relative: 6 %
Neutro Abs: 4.8 K/uL (ref 1.7–7.7)
Neutrophils Relative %: 55 %
Platelets: 274 K/uL (ref 150–400)
RBC: 4.04 MIL/uL (ref 3.87–5.11)
RDW: 12.7 % (ref 11.5–15.5)
WBC: 8.7 K/uL (ref 4.0–10.5)
nRBC: 0 % (ref 0.0–0.2)

## 2022-08-13 LAB — BASIC METABOLIC PANEL
Anion gap: 10 (ref 5–15)
BUN: 16 mg/dL (ref 6–20)
CO2: 22 mmol/L (ref 22–32)
Calcium: 10.1 mg/dL (ref 8.9–10.3)
Chloride: 109 mmol/L (ref 98–111)
Creatinine, Ser: 0.91 mg/dL (ref 0.44–1.00)
GFR, Estimated: >60 mL/min (ref >60)
Glucose, Bld: 83 mg/dL (ref 70–99)
Potassium: 4.5 mmol/L (ref 3.5–5.1)
Sodium: 141 mmol/L (ref 135–145)

## 2022-08-13 LAB — PROTIME-INR
INR: 1 (ref 0.8–1.2)
Prothrombin Time: 13.5 seconds (ref 11.4–15.2)

## 2022-08-13 LAB — GLUCOSE, CAPILLARY: Glucose-Capillary: 83 mg/dL (ref 70–99)

## 2022-08-13 MED ORDER — SODIUM CHLORIDE 0.9 % IV SOLN: INTRAVENOUS | Status: DC

## 2022-08-13 MED ORDER — ONDANSETRON HCL 4 MG/2ML IJ SOLN: 4.0000 mg | Freq: Four times a day (QID) | INTRAMUSCULAR | Status: DC | PRN

## 2022-08-13 MED ORDER — SODIUM CHLORIDE 0.9 % IV SOLN: Freq: Once | INTRAVENOUS | Status: AC

## 2022-08-13 MED ORDER — LEVETIRACETAM IN NACL 500 MG/100ML IV SOLN
500.0000 mg | Freq: Once | INTRAVENOUS | Status: AC
  Administered 2022-08-13: 500 mg via INTRAVENOUS
  Filled 2022-08-13: qty 100

## 2022-08-13 MED ORDER — SODIUM CHLORIDE 0.45 % IV SOLN: INTRAVENOUS | Status: DC

## 2022-08-13 MED ORDER — LEVETIRACETAM IN NACL 500 MG/100ML IV SOLN
500.0000 mg | Freq: Two times a day (BID) | INTRAVENOUS | Status: DC
  Administered 2022-08-14 (×2): 500 mg via INTRAVENOUS
  Filled 2022-08-13 (×2): qty 100

## 2022-08-13 MED ORDER — DEXTROSE 50 % IV SOLN: 25.0000 g | INTRAVENOUS | Status: DC | PRN

## 2022-08-13 MED ORDER — ONDANSETRON HCL 4 MG PO TABS: 4.0000 mg | ORAL_TABLET | Freq: Four times a day (QID) | ORAL | Status: DC | PRN

## 2022-08-13 MED ORDER — ACETAMINOPHEN 650 MG RE SUPP: 650.0000 mg | Freq: Four times a day (QID) | RECTAL | Status: DC | PRN

## 2022-08-13 MED ORDER — ACETAMINOPHEN 325 MG PO TABS: 650.0000 mg | ORAL_TABLET | Freq: Four times a day (QID) | ORAL | Status: DC | PRN

## 2022-08-13 NOTE — Consult Note (Addendum)
Chief Complaint: Tamara [name]  Referring Physician(s): Thayer Jew  Supervising Physician: Jacqulynn Cadet  Patient Status: [facility] - ED  History of Present Illness: Tamara [name] is a 61 y.o. female with medical issues including schizophrenia and diabetes.  She presented to the ED last night after her facility found her Gtube dislodged.  The tube was initially placed 04/19/2020.  We are asked to replace the tube.   Past Medical History:  Diagnosis Date   Asthma    Cognitive communication deficit    COVID-19    Diabetes mellitus without complication (Tamara [name])    Dysphagia, oropharyngeal phase    Hypertension    Major depressive disorder with single episode    Memory changes    Metabolic encephalopathy    Muscle weakness (generalized)    Other abnormalities of gait and mobility    Renal disorder    Schizoaffective disorder (HCC)    Seizures ([place])    Unspecified sequelae of unspecified cerebrovascular disease    Unspecified severe protein-calorie malnutrition ([place])    Unsteadiness on feet     Past Surgical History:  Procedure Laterality Date   INTRAMEDULLARY (IM) NAIL INTERTROCHANTERIC Right 10/29/2020   Procedure: INTRAMEDULLARY (IM) NAIL INTERTROCHANTRIC;  Surgeon: Nicholes Stairs, MD;  Location: [place];  Service: Orthopedics;  Laterality: Right;   IR GASTROSTOMY TUBE MOD SED  04/19/2020    Allergies: Chlorhexidine  Medications: Prior to Admission medications   Medication Sig Start Date End Date Taking? Authorizing Provider  Acetaminophen 500 MG capsule Place 2 capsules (1,000 mg total) into feeding tube 3 (three) times daily as needed for pain. 04/23/20   Raiford Noble Latif, DO  albuterol (PROVENTIL HFA;VENTOLIN HFA) 108 (90 BASE) MCG/ACT inhaler Inhale 2 puffs into the lungs every 6 (six) hours as needed for wheezing. 10/04/12   Johnson, Clanford L, MD  Ascorbic Acid (VITAMIN C) 1000 MG tablet Place 1,000 mg into feeding tube daily.    [provider]  aspirin (ASPIRIN CHILDRENS) 81 MG chewable tablet Place 1 tablet (81 mg total) into feeding tube daily. 04/23/20   Raiford Noble Latif, DO  atorvastatin (LIPITOR) 80 MG tablet Place 0.5 tablets (40 mg total) into feeding tube at bedtime. 04/23/20   Raiford Noble Latif, DO  buPROPion (WELLBUTRIN SR) 150 MG 12 hr tablet Take 150 mg by mouth daily. Via tube    [provider]  docusate sodium (COLACE) 100 MG capsule Take 1 capsule (100 mg total) by mouth 2 (two) times daily. 11/02/20   Johnson, Clanford L, MD  Ergocalciferol (VITAMIN D2) 50 MCG (2000 UT) TABS Place 2,000 Units into feeding tube daily.    [provider]  escitalopram (LEXAPRO) 10 MG tablet Place 10 mg into feeding tube daily.    [provider]  Fluticasone-Salmeterol (ADVAIR) 250-50 MCG/DOSE AEPB Inhale 1 puff into the lungs 2 (two) times daily.    [provider]  folic acid (FOLVITE) 1 MG tablet Place 1 tablet (1 mg total) into feeding tube daily. 04/23/20   Raiford Noble Latif, DO  levETIRAcetam (KEPPRA) 100 MG/ML solution Place 5 mLs (500 mg total) into feeding tube 2 (two) times daily. 04/23/20   Raiford Noble Latif, DO  loratadine (CLARITIN) 10 MG tablet Place 1 tablet (10 mg total) into feeding tube daily. 04/23/20   Raiford Noble Latif, DO  losartan (COZAAR) 100 MG tablet Place 1 tablet (100 mg total) into feeding tube daily. 04/23/20   Raiford Noble Latif, DO  magnesium 30 MG  tablet Place 1 tablet (30 mg total) into feeding tube daily. 04/23/20   Sheikh, Omair Latif, DO  melatonin 5 MG TABS Place 5 mg into feeding tube at bedtime.    [provider]  metFORMIN (GLUCOPHAGE) 1000 MG tablet Place 1 tablet (1,000 mg total) into feeding tube 2 (two) times daily with a meal. 04/23/20   Sheikh, Omair Latif, DO  MIRALAX 17 GM/SCOOP powder Place 17 g into feeding tube at bedtime. 04/23/20   Raiford Noble Latif, DO  mirtazapine (REMERON) 15 MG tablet Place 15 mg into feeding tube at  bedtime.    [provider]  montelukast (SINGULAIR) 10 MG tablet Place 1 tablet (10 mg total) into feeding tube at bedtime. 04/23/20   Raiford Noble Latif, DO  Multiple Vitamin (MULTIVITAMIN WITH MINERALS) TABS tablet Place 1 tablet into feeding tube daily. 04/23/20   Raiford Noble Latif, DO  Nutritional Supplements (FEEDING SUPPLEMENT, OSMOLITE 1.5 CAL,) LIQD Place 1,000 mLs into feeding tube continuous. 11/02/20   Johnson, Clanford L, MD  Nutritional Supplements (FEEDING SUPPLEMENT, PROSOURCE TF,) liquid Place 45 mLs into feeding tube 3 (three) times daily. 11/02/20   Johnson, Clanford L, MD  omeprazole (PRILOSEC) 20 MG capsule Take 20 mg by mouth daily. Via tube    [provider]  Quercetin 250 MG TABS Give 250 mg by tube in the morning and at bedtime.    [provider]  risperiDONE (RISPERDAL) 1 MG tablet Place 1 mg into feeding tube at bedtime.    [provider]  thiamine 100 MG tablet Place 1 tablet (100 mg total) into feeding tube daily. 04/23/20   Raiford Noble Latif, DO  Water For Irrigation, Sterile (FREE WATER) SOLN Place 200 mLs into feeding tube every 8 (eight) hours. 04/23/20   Raiford Noble Latif, DO  zinc gluconate 50 MG tablet Place 50 mg into feeding tube daily.    [provider]     Family History  Problem Relation Age of Onset   Hypertension Mother    Hypertension Father     Social History   Socioeconomic History   Marital status: Single    Spouse name: Not on file   Number of children: Not on file   Years of education: Not on file   Highest education level: Not on file  Occupational History   Not on file  Tobacco Use   Smoking status: Never   Smokeless tobacco: Never  Substance and Sexual Activity   Alcohol use: Not Currently   Drug use: Not Currently   Sexual activity: Not Currently  Other Topics Concern   Not on file  Social History Narrative   Not on file   Social Determinants of Health   Financial Resource  Strain: Not on file  Food Insecurity: Not on file  Transportation Needs: Not on file  Physical Activity: Not on file  Stress: Not on file  Social Connections: Not on file     Review of Systems  Unable to perform ROS: Psychiatric disorder    Vital Signs: BP (!) 153/76 (BP Location: Left Arm)   Pulse 68   Temp 98.9 F (37.2 C) (Oral)   Resp 16   SpO2 100%   Physical Exam Vitals reviewed.  Cardiovascular:     Rate and Rhythm: Normal rate and regular rhythm.  Pulmonary:     Effort: Pulmonary effort is normal. No respiratory distress.     Breath sounds: Normal breath sounds.  Abdominal:     Palpations: Abdomen  is soft.     Comments: Gtube stoma appears to be healed over. It appears the Gtube has been out for a prolonged period of time. There is no erythema, no drainage.   Neurological:     Mental Status: She is alert.     Imaging: No results found.  Labs:  CBC: No results for input(s): "WBC", "HGB", "HCT", "PLT" in the last 8760 hours.  COAGS: No results for input(s): "INR", "APTT" in the last 8760 hours.  BMP: No results for input(s): "NA", "K", "CL", "CO2", "GLUCOSE", "BUN", "CALCIUM", "CREATININE", "GFRNONAA", "GFRAA" in the last 8760 hours.  Invalid input(s): "CMP"  LIVER FUNCTION TESTS: No results for input(s): "BILITOT", "AST", "ALT", "ALKPHOS", "PROT", "ALBUMIN" in the last 8760 hours.  TUMOR MARKERS: No results for input(s): "AFPTM", "CEA", "CA199", "CHROMGRNA" in the last 8760 hours.  Assessment and Plan:  Gastrostomy tube dislodged for unknown amount of time.   Will proceed with image guided re-placement of the Gtube hopefully tomorrow as IR schedule allows.  Risks and benefits image guided gastrostomy tube placement was discussed with the patient including, but not limited to the need for a barium enema during the procedure, bleeding, infection, peritonitis and/or damage to adjacent structures.  I have requested providers please hold  anticoagulation/aspirin.  I have been unable to reach any of the contacts in Tamara [name]'s chart today.  Will try to call contacts again tomorrow for consent.  Thank you for allowing our service to participate in Tamara [name] 's care.  Electronically Signed: Gwynneth Macleod, PA-C   08/13/2022, 9:47 AM      I spent a total of 20 Minutes  in face to face in clinical consultation, greater than 50% of which was counseling/coordinating care for gtube replacement.

## 2022-08-13 NOTE — ED Provider Notes (Signed)
[facility]-EMERGENCY DEPT Provider Note   CSN: [id number] Arrival date & time: 08/12/22  2232     History  Chief Complaint  Patient presents with   G Tube Dislodgement    Tamara [name] is a 61 y.o. female.  HPI     This is a 61-year-old female who presents with reported G-tube dislodgment.  She is nonverbal.  She does not provide history.  Per EMS when the facility went to change her G-tube, they noted that it was no longer in place.  No further history is available at this time.  Level 5 caveat  Home Medications Prior to Admission medications   Medication Sig Start Date End Date Taking? Authorizing Provider  Acetaminophen 500 MG capsule Place 2 capsules (1,000 mg total) into feeding tube 3 (three) times daily as needed for pain. 04/23/20   Sheikh, Omair Latif, DO  albuterol (PROVENTIL HFA;VENTOLIN HFA) 108 (90 BASE) MCG/ACT inhaler Inhale 2 puffs into the lungs every 6 (six) hours as needed for wheezing. 10/04/12   Johnson, Clanford L, MD  Ascorbic Acid (VITAMIN C) 1000 MG tablet Place 1,000 mg into feeding tube daily.    [provider]  aspirin (ASPIRIN CHILDRENS) 81 MG chewable tablet Place 1 tablet (81 mg total) into feeding tube daily. 04/23/20   Sheikh, Omair Latif, DO  atorvastatin (LIPITOR) 80 MG tablet Place 0.5 tablets (40 mg total) into feeding tube at bedtime. 04/23/20   Sheikh, Omair Latif, DO  buPROPion (WELLBUTRIN SR) 150 MG 12 hr tablet Take 150 mg by mouth daily. Via tube    [provider]  docusate sodium (COLACE) 100 MG capsule Take 1 capsule (100 mg total) by mouth 2 (two) times daily. 11/02/20   Johnson, Clanford L, MD  Ergocalciferol (VITAMIN D2) 50 MCG (2000 UT) TABS Place 2,000 Units into feeding tube daily.    [provider]  escitalopram (LEXAPRO) 10 MG tablet Place 10 mg into feeding tube daily.    [provider]  Fluticasone-Salmeterol (ADVAIR) 250-50 MCG/DOSE AEPB Inhale 1 puff into the lungs 2  (two) times daily.    [provider]  folic acid (FOLVITE) 1 MG tablet Place 1 tablet (1 mg total) into feeding tube daily. 04/23/20   Sheikh, Omair Latif, DO  levETIRAcetam (KEPPRA) 100 MG/ML solution Place 5 mLs (500 mg total) into feeding tube 2 (two) times daily. 04/23/20   Sheikh, Omair Latif, DO  loratadine (CLARITIN) 10 MG tablet Place 1 tablet (10 mg total) into feeding tube daily. 04/23/20   Sheikh, Omair Latif, DO  losartan (COZAAR) 100 MG tablet Place 1 tablet (100 mg total) into feeding tube daily. 04/23/20   Sheikh, Omair Latif, DO  magnesium 30 MG tablet Place 1 tablet (30 mg total) into feeding tube daily. 04/23/20   Sheikh, Omair Latif, DO  melatonin 5 MG TABS Place 5 mg into feeding tube at bedtime.    [provider]  metFORMIN (GLUCOPHAGE) 1000 MG tablet Place 1 tablet (1,000 mg total) into feeding tube 2 (two) times daily with a meal. 04/23/20   Sheikh, Omair Latif, DO  MIRALAX 17 GM/SCOOP powder Place 17 g into feeding tube at bedtime. 04/23/20   Sheikh, Omair Latif, DO  mirtazapine (REMERON) 15 MG tablet Place 15 mg into feeding tube at bedtime.    [provider]  montelukast (SINGULAIR) 10 MG tablet Place 1 tablet (10 mg total) into feeding tube at bedtime. 04/23/20   Sheikh, Omair Latif, DO  Multiple Vitamin (MULTIVITAMIN   WITH MINERALS) TABS tablet Place 1 tablet into feeding tube daily. 04/23/20   Raiford Noble Latif, DO  Nutritional Supplements (FEEDING SUPPLEMENT, OSMOLITE 1.5 CAL,) LIQD Place 1,000 mLs into feeding tube continuous. 11/02/20   Johnson, Clanford L, MD  Nutritional Supplements (FEEDING SUPPLEMENT, PROSOURCE TF,) liquid Place 45 mLs into feeding tube 3 (three) times daily. 11/02/20   Johnson, Clanford L, MD  omeprazole (PRILOSEC) 20 MG capsule Take 20 mg by mouth daily. Via tube    [provider]  Quercetin 250 MG TABS Give 250 mg by tube in the morning and at bedtime.    [provider]  risperiDONE (RISPERDAL) 1 MG tablet  Place 1 mg into feeding tube at bedtime.    [provider]  thiamine 100 MG tablet Place 1 tablet (100 mg total) into feeding tube daily. 04/23/20   Raiford Noble Latif, DO  Water For Irrigation, Sterile (FREE WATER) SOLN Place 200 mLs into feeding tube every 8 (eight) hours. 04/23/20   Raiford Noble Latif, DO  zinc gluconate 50 MG tablet Place 50 mg into feeding tube daily.    [provider]      Allergies    Chlorhexidine    Review of Systems   Review of Systems  Unable to perform ROS: Patient nonverbal    Physical Exam Updated Vital Signs BP (!) 147/91   Pulse 84   Temp 98.9 F (37.2 C) (Oral)   Resp 18   SpO2 95%  Physical Exam Vitals and nursing note reviewed.  Constitutional:      Appearance: She is well-developed.     Comments: Chronically ill-appearing, nontoxic  HENT:     Head: Normocephalic and atraumatic.     Mouth/Throat:     Mouth: Mucous membranes are moist.  Eyes:     Pupils: Pupils are equal, round, and reactive to light.  Cardiovascular:     Rate and Rhythm: Normal rate and regular rhythm.     Heart sounds: Normal heart sounds.  Pulmonary:     Effort: Pulmonary effort is normal. No respiratory distress.     Breath sounds: No wheezing.  Abdominal:     Palpations: Abdomen is soft.     Comments: Abdomen soft, nontender, G-tube site with some slight drainage and crusting, no overt erythema  Musculoskeletal:     Cervical back: Neck supple.  Skin:    General: Skin is warm and dry.  Neurological:     Mental Status: She is alert.     Comments: Unable to assess orientation as patient is nonverbal     ED Results / Procedures / Treatments   Labs (all labs ordered are listed, but only abnormal results are displayed) Labs Reviewed - No data to display  EKG None  Radiology No results found.  Procedures Gastrostomy tube replacement  Date/Time: 08/13/2022 4:35 AM  Performed by: Merryl Hacker, MD Authorized by: Merryl Hacker, MD  Consent: The procedure was performed in an emergent situation. Verbal consent not obtained. Local anesthesia used: no  Anesthesia: Local anesthesia used: no  Sedation: Patient sedated: no  Comments: Attempted replacement with 16 French G-tube.  Patient did not tolerate this and I met significant resistance.  Attempted placement with smaller 12 and 14 French Foley catheter; however, unable to place.       Medications Ordered in ED Medications - No data to display  ED Course/ Medical Decision Making/ A&P  Medical Decision Making  This patient presents to the ED for concern of G-tube dislodgment, this involves an extensive number of treatment options, and is a complaint that carries with it a high risk of complications and morbidity.  I considered the following differential and admission for this acute, potentially life threatening condition.  The differential diagnosis includes dislodgment, tract occlusion  MDM:    This is a 61-year-old female who presents with G-tube dislodgment.  She has significant drainage and crusting at the site.  After removal, I was unable to replace tube.  Unclear how long the tube had been out; however, suspect it may have been from a prolonged period of time.  I was unable to place even a smaller tube.  Order placed for IR replacement.  (Labs, imaging, consults)  Labs: I Ordered, and personally interpreted labs.  The pertinent results include: None  Imaging Studies ordered: I ordered imaging studies including none I independently visualized and interpreted imaging. I agree with the radiologist interpretation  Additional history obtained from chart review.  External records from outside source obtained and reviewed including prior evaluation  Cardiac Monitoring: The patient was maintained on a cardiac monitor.  I personally viewed and interpreted the cardiac monitored which showed an underlying rhythm of:  Normal sinus rhythm  Reevaluation: After the interventions noted above, I reevaluated the patient and found that they have :stayed the same  Social Determinants of Health:  chronic disability and cognitive delay  Disposition: Pending IR placement of G-tube  Co morbidities that complicate the patient evaluation  Past Medical History:  Diagnosis Date   Asthma    Cognitive communication deficit    COVID-19    Diabetes mellitus without complication (HCC)    Dysphagia, oropharyngeal phase    Hypertension    Major depressive disorder with single episode    Memory changes    Metabolic encephalopathy    Muscle weakness (generalized)    Other abnormalities of gait and mobility    Renal disorder    Schizoaffective disorder (HCC)    Seizures (HCC)    Unspecified sequelae of unspecified cerebrovascular disease    Unspecified severe protein-calorie malnutrition (HCC)    Unsteadiness on feet      Medicines No orders of the defined types were placed in this encounter.   I have reviewed the patients home medicines and have made adjustments as needed  Problem List / ED Course: Problem List Items Addressed This Visit   None               Final Clinical Impression(s) / ED Diagnoses Final diagnoses:  None    Rx / DC Orders ED Discharge Orders     None         ,  F, MD 08/13/22 0438  

## 2022-08-13 NOTE — Progress Notes (Signed)
Unable to complete admission data base due to confusion and cognitive state.

## 2022-08-13 NOTE — H&P (Signed)
History and Physical    Patient: Tamara [name] IHK:[id number] DOB: 1961-05-25 DOA: 08/12/2022 DOS: the patient was seen and examined on 08/13/2022 PCP: [name], NP  Patient coming from: Home  Chief Complaint:  Chief Complaint  Patient presents with   G Tube Dislodgement   HPI: Tamara [name] is a 61 y.o. female with medical history significant of asthma, hypertension, depression, generalized muscle weakness, unspecified renal disorder, schizoaffective disorder, seizures, memory changes, cognitive communication deficit, COVID-19, type 2 diabetes, oropharyngeal dysphagia, history of PEG tube placement who was sent from her facility due to PEG tube dislodgment.  She is unable to provide further information at this time but able to answer simple questions.  ED course: 97.9 F, pulse 61, respirations 16, BP 117/86 mmHg O2 sat 100% on room air.  The patient received Keppra 500 mg IVPB x 1.  Lab work: CBC showed a white count of 8.7 with 55% neutrophils, hemoglobin 11.8 g/dL platelets 756.  Normal PT, INR and BMP.   Review of Systems: As mentioned in the history of present illness. All other systems reviewed and are negative. Past Medical History:  Diagnosis Date   Asthma    Cognitive communication deficit    COVID-19    Diabetes mellitus without complication (HCC)    Dysphagia, oropharyngeal phase    Hypertension    Major depressive disorder with single episode    Memory changes    Metabolic encephalopathy    Muscle weakness (generalized)    Other abnormalities of gait and mobility    Renal disorder    Schizoaffective disorder (HCC)    Seizures (HCC)    Unspecified sequelae of unspecified cerebrovascular disease    Unspecified severe protein-calorie malnutrition (HCC)    Unsteadiness on feet    Past Surgical History:  Procedure Laterality Date   INTRAMEDULLARY (IM) NAIL INTERTROCHANTERIC Right 10/29/2020   Procedure: INTRAMEDULLARY (IM) NAIL INTERTROCHANTRIC;  Surgeon:  Yolonda Kida, MD;  Location: [facility] OR;  Service: Orthopedics;  Laterality: Right;   IR GASTROSTOMY TUBE MOD SED  04/19/2020   Social History:  reports that she has never smoked. She has never used smokeless tobacco. She reports that she does not currently use alcohol. She reports that she does not currently use drugs.  Allergies  Allergen Reactions   Chlorhexidine     Family History  Problem Relation Age of Onset   Hypertension Mother    Hypertension Father     Prior to Admission medications   Medication Sig Start Date End Date Taking? Authorizing Provider  Acetaminophen 500 MG capsule Place 2 capsules (1,000 mg total) into feeding tube 3 (three) times daily as needed for pain. 04/23/20  Yes Sheikh, Omair Latif, DO  albuterol (PROVENTIL HFA;VENTOLIN HFA) 108 (90 BASE) MCG/ACT inhaler Inhale 2 puffs into the lungs every 6 (six) hours as needed for wheezing. 10/04/12  Yes Johnson, Clanford L, MD  aspirin (ASPIRIN CHILDRENS) 81 MG chewable tablet Place 1 tablet (81 mg total) into feeding tube daily. 04/23/20  Yes Sheikh, Omair Latif, DO  atorvastatin (LIPITOR) 80 MG tablet Place 0.5 tablets (40 mg total) into feeding tube at bedtime. 04/23/20  Yes Sheikh, Omair Latif, DO  buPROPion ER [facility] SR) 100 MG 12 hr tablet Take 100 mg by mouth daily.   Yes [provider]  docusate sodium (COLACE) 100 MG capsule Take 1 capsule (100 mg total) by mouth 2 (two) times daily. 11/02/20  Yes Johnson, Clanford L, MD  Ergocalciferol (VITAMIN D2) 50 MCG (2000 UT)  TABS Place 2,000 Units into feeding tube daily.   Yes [provider]  Fluticasone-Salmeterol (ADVAIR) 250-50 MCG/DOSE AEPB Inhale 1 puff into the lungs 2 (two) times daily.   Yes [provider]  folic acid (FOLVITE) 1 MG tablet Place 1 tablet (1 mg total) into feeding tube daily. 04/23/20  Yes Sheikh, Omair Latif, DO  levETIRAcetam (KEPPRA) 100 MG/ML solution Place 5 mLs (500 mg total) into feeding tube 2 (two) times  daily. 04/23/20  Yes Sheikh, Omair Latif, DO  losartan (COZAAR) 100 MG tablet Place 1 tablet (100 mg total) into feeding tube daily. 04/23/20  Yes Sheikh, Omair Latif, DO  magnesium 30 MG tablet Place 1 tablet (30 mg total) into feeding tube daily. 04/23/20  Yes Sheikh, Omair Latif, DO  melatonin 5 MG TABS Place 5 mg into feeding tube at bedtime.   Yes [provider]  metFORMIN (GLUCOPHAGE) 1000 MG tablet Place 1 tablet (1,000 mg total) into feeding tube 2 (two) times daily with a meal. 04/23/20  Yes Sheikh, Omair McCarr, DO  MIRALAX 17 GM/SCOOP powder Place 17 g into feeding tube at bedtime. 04/23/20  Yes Sheikh, Omair Latif, DO  montelukast (SINGULAIR) 10 MG tablet Place 1 tablet (10 mg total) into feeding tube at bedtime. 04/23/20  Yes Sheikh, Omair Latif, DO  Multiple Vitamin (MULTIVITAMIN WITH MINERALS) TABS tablet Place 1 tablet into feeding tube daily. 04/23/20  Yes Sheikh, Omair Latif, DO  Quercetin 250 MG TABS Give 250 mg by tube in the morning and at bedtime.   Yes [provider]  thiamine 100 MG tablet Place 1 tablet (100 mg total) into feeding tube daily. 04/23/20  Yes Sheikh, Omair Latif, DO  traMADol (ULTRAM) 50 MG tablet Take 50 mg by mouth every 6 (six) hours as needed for moderate pain.   Yes [provider]  Ascorbic Acid (VITAMIN C) 1000 MG tablet Place 1,000 mg into feeding tube daily.    [provider]  escitalopram (LEXAPRO) 10 MG tablet Place 10 mg into feeding tube daily.    [provider]  loratadine (CLARITIN) 10 MG tablet Place 1 tablet (10 mg total) into feeding tube daily. 04/23/20   Marguerita Merles Latif, DO  mirtazapine (REMERON) 15 MG tablet Place 15 mg into feeding tube at bedtime.    [provider]  Nutritional Supplements (FEEDING SUPPLEMENT, OSMOLITE 1.5 CAL,) LIQD Place 1,000 mLs into feeding tube continuous. 11/02/20   Johnson, Clanford L, MD  Nutritional Supplements (FEEDING SUPPLEMENT, PROSOURCE TF,) liquid Place 45  mLs into feeding tube 3 (three) times daily. 11/02/20   Johnson, Clanford L, MD  omeprazole (PRILOSEC) 20 MG capsule Take 20 mg by mouth daily. Via tube    [provider]  risperiDONE (RISPERDAL) 1 MG tablet Place 1 mg into feeding tube at bedtime.    [provider]  Water For Irrigation, Sterile (FREE WATER) SOLN Place 200 mLs into feeding tube every 8 (eight) hours. 04/23/20   Marguerita Merles Latif, DO  zinc gluconate 50 MG tablet Place 50 mg into feeding tube daily.    [provider]    Physical Exam: Vitals:   08/13/22 0615 08/13/22 0803 08/13/22 1025 08/13/22 1300  BP: 138/73 (!) 153/76 (!) 152/133 (!) 106/90  Pulse: 70 68 80 69  Resp: 18 16 16 16   Temp:      TempSrc:      SpO2: 100% 100% 100% 99%   Physical Exam Vitals and nursing note reviewed.  Constitutional:  General: She is awake.     Appearance: She is ill-appearing.     Comments: Chronically ill-appearing.  HENT:     Head: Normocephalic.     Nose: No rhinorrhea.     Mouth/Throat:     Mouth: Mucous membranes are moist.  Eyes:     General: No scleral icterus.    Pupils: Pupils are equal, round, and reactive to light.  Neck:     Vascular: No JVD.  Cardiovascular:     Rate and Rhythm: Normal rate and regular rhythm.     Heart sounds: S1 normal and S2 normal.  Pulmonary:     Effort: Pulmonary effort is normal.     Breath sounds: Normal breath sounds.  Abdominal:     General: The ostomy site is clean. Bowel sounds are normal.     Palpations: Abdomen is soft.     Tenderness: There is no abdominal tenderness.     Comments: Stoma area is healed and not patent.  Musculoskeletal:     Cervical back: Neck supple.     Right lower leg: No edema.     Left lower leg: No edema.  Skin:    General: Skin is warm.  Neurological:     Mental Status: She is alert. Mental status is at baseline.  Psychiatric:        Mood and Affect: Mood normal.        Behavior: Behavior is cooperative.   Data  Reviewed:  Results are pending, will review when available.  Assessment and Plan: Principal Problem:   PEG (percutaneous endoscopic gastrostomy)  adjustment/replacement/removal (HCC) Currently NPO. Admit to MedSurg/observation. Continue parenteral medications schedule or as needed. Interventional radiology will see in the morning.  Active Problems:   Type 2 diabetes mellitus (HCC) Currently NPO.    Dyslipidemia Resume therapy once PEG placed.    Essential hypertension Parenteral antihypertensives.    Normocytic anemia Monitor hematocrit and hemoglobin.    Schizoaffective disorder (HCC) Resume medications once PEG able to be used.     Advance Care Planning:   Code Status: Full code.  Consults: Interventional radiology..  Family Communication:   Severity of Illness: The appropriate patient status for this patient is OBSERVATION. Observation status is judged to be reasonable and necessary in order to provide the required intensity of service to ensure the patient's safety. The patient's presenting symptoms, physical exam findings, and initial radiographic and laboratory data in the context of their medical condition is felt to place them at decreased risk for further clinical deterioration. Furthermore, it is anticipated that the patient will be medically stable for discharge from the hospital within 2 midnights of admission.   Author: Bobette Mo, MD 08/13/2022 1:16 PM  For on call review [email or web address].   This document was prepared using Dragon voice recognition software and may contain some unintended transcription errors.

## 2022-08-13 NOTE — ED Notes (Signed)
Attempted to get pt temperature, she spit out probe and grabbed several times. Will attempt again later.

## 2022-08-13 NOTE — ED Provider Notes (Signed)
Plan was for IR replacement of dislodged G-tube.  IR has evaluated patient and noted that G-tube is likely been out for a long period of time and they will need to approach this as a new insertion.  IR request patient be admitted and they will schedule for her to get her tube replaced but cannot accommodate at this time. Physical Exam  BP (!) 153/76 (BP Location: Left Arm)   Pulse 68   Temp 98.9 F (37.2 C) (Oral)   Resp 16   SpO2 100%   Physical Exam  Procedures  Procedures  ED Course / MDM    Medical Decision Making Amount and/or Complexity of Data Reviewed Labs: ordered.  Risk Prescription drug management.   Given IR's assessment and recommendation for admission due to prolonged dislodgment of G-tube, will proceed with basic lab work and initiate peripheral hydration.  Will plan for consultation and admission. IR requests all anticoagulant and aspirin be stopped.       Arby Barrette, MD 08/13/22 1037

## 2022-08-14 ENCOUNTER — Observation Stay (HOSPITAL_COMMUNITY): Payer: Medicaid Other

## 2022-08-14 DIAGNOSIS — Z431 Encounter for attention to gastrostomy: Secondary | ICD-10-CM | POA: Diagnosis not present

## 2022-08-14 HISTORY — PX: IR REPLC GASTRO/COLONIC TUBE PERCUT W/FLUORO: IMG2333

## 2022-08-14 LAB — COMPREHENSIVE METABOLIC PANEL
ALT: 12 U/L (ref 0–44)
AST: 17 U/L (ref 15–41)
Albumin: 3.7 g/dL (ref 3.5–5.0)
Alkaline Phosphatase: 100 U/L (ref 38–126)
Anion gap: 9 (ref 5–15)
BUN: 14 mg/dL (ref 6–20)
CO2: 21 mmol/L — ABNORMAL LOW (ref 22–32)
Calcium: 9.5 mg/dL (ref 8.9–10.3)
Chloride: 108 mmol/L (ref 98–111)
Creatinine, Ser: 0.83 mg/dL (ref 0.44–1.00)
GFR, Estimated: >60 mL/min (ref >60)
Glucose, Bld: 81 mg/dL (ref 70–99)
Potassium: 3.7 mmol/L (ref 3.5–5.1)
Sodium: 138 mmol/L (ref 135–145)
Total Bilirubin: 0.6 mg/dL (ref 0.3–1.2)
Total Protein: 7.4 g/dL (ref 6.5–8.1)

## 2022-08-14 LAB — CBC
HCT: 36.4 % (ref 36.0–46.0)
Hemoglobin: 11.5 g/dL — ABNORMAL LOW (ref 12.0–15.0)
MCH: 29.2 pg (ref 26.0–34.0)
MCHC: 31.6 g/dL (ref 30.0–36.0)
MCV: 92.4 fL (ref 80.0–100.0)
Platelets: 244 10*3/uL (ref 150–400)
RBC: 3.94 MIL/uL (ref 3.87–5.11)
RDW: 12.7 % (ref 11.5–15.5)
WBC: 6.9 10*3/uL (ref 4.0–10.5)
nRBC: 0 % (ref 0.0–0.2)

## 2022-08-14 LAB — GLUCOSE, CAPILLARY
Glucose-Capillary: 81 mg/dL (ref 70–99)
Glucose-Capillary: 82 mg/dL (ref 70–99)
Glucose-Capillary: 84 mg/dL (ref 70–99)
Glucose-Capillary: 88 mg/dL (ref 70–99)
Glucose-Capillary: 88 mg/dL (ref 70–99)

## 2022-08-14 LAB — HIV ANTIBODY (ROUTINE TESTING W REFLEX): HIV Screen 4th Generation wRfx: NONREACTIVE

## 2022-08-14 MED ORDER — FENTANYL CITRATE (PF) 100 MCG/2ML IJ SOLN
INTRAMUSCULAR | Status: AC
  Filled 2022-08-14: qty 2

## 2022-08-14 MED ORDER — LEVETIRACETAM 100 MG/ML PO SOLN
500.0000 mg | Freq: Two times a day (BID) | ORAL | Status: DC
  Administered 2022-08-14 – 2022-08-16 (×4): 500 mg
  Filled 2022-08-14 (×4): qty 5

## 2022-08-14 MED ORDER — LOSARTAN POTASSIUM 50 MG PO TABS
100.0000 mg | ORAL_TABLET | Freq: Every day | ORAL | Status: DC
  Administered 2022-08-14 – 2022-08-16 (×3): 100 mg
  Filled 2022-08-14 (×3): qty 2

## 2022-08-14 MED ORDER — IOHEXOL 300 MG/ML  SOLN
50.0000 mL | Freq: Once | INTRAMUSCULAR | Status: AC | PRN
  Administered 2022-08-14: 10 mL

## 2022-08-14 MED ORDER — MONTELUKAST SODIUM 10 MG PO TABS
10.0000 mg | ORAL_TABLET | Freq: Every day | ORAL | Status: DC
  Administered 2022-08-14 – 2022-08-15 (×2): 10 mg
  Filled 2022-08-14 (×2): qty 1

## 2022-08-14 MED ORDER — ALBUTEROL SULFATE (2.5 MG/3ML) 0.083% IN NEBU: 2.5000 mg | INHALATION_SOLUTION | Freq: Four times a day (QID) | RESPIRATORY_TRACT | Status: DC | PRN

## 2022-08-14 MED ORDER — LIDOCAINE VISCOUS HCL 2 % MT SOLN
OROMUCOSAL | Status: AC
  Administered 2022-08-14: 8 mL
  Filled 2022-08-14: qty 15

## 2022-08-14 MED ORDER — LIDOCAINE HCL 1 % IJ SOLN
INTRAMUSCULAR | Status: AC
  Filled 2022-08-14: qty 20

## 2022-08-14 MED ORDER — MIDAZOLAM HCL 2 MG/2ML IJ SOLN
INTRAMUSCULAR | Status: AC
  Filled 2022-08-14: qty 2

## 2022-08-14 MED ORDER — ATORVASTATIN CALCIUM 20 MG PO TABS
40.0000 mg | ORAL_TABLET | Freq: Every day | ORAL | Status: DC
  Administered 2022-08-14 – 2022-08-15 (×2): 40 mg
  Filled 2022-08-14 (×2): qty 2

## 2022-08-14 MED ORDER — MIDAZOLAM HCL 2 MG/2ML IJ SOLN
INTRAMUSCULAR | Status: AC | PRN
  Administered 2022-08-14: 1 mg via INTRAVENOUS

## 2022-08-14 MED ORDER — FENTANYL CITRATE (PF) 100 MCG/2ML IJ SOLN
INTRAMUSCULAR | Status: AC | PRN
  Administered 2022-08-14: 50 ug via INTRAVENOUS

## 2022-08-14 MED ORDER — BUPROPION HCL 100 MG PO TABS
100.0000 mg | ORAL_TABLET | Freq: Every day | ORAL | Status: DC
  Administered 2022-08-15 – 2022-08-16 (×2): 100 mg
  Filled 2022-08-14 (×2): qty 1

## 2022-08-14 NOTE — TOC Initial Note (Signed)
Transition of Care [facility]) - Initial/Assessment Note    Patient Details  Name: Tamara [name] MRN: [id number] Date of Birth: Jul 16, 1961  [facility] CM/SW Contact:    Amada Jupiter, LCSW Phone Number: 08/14/2022, 3:11 PM  Clinical Narrative:                 Received TOC referral as pt is admitted form LTC bed at [facility] with need for PEG replacement.  Confirmed with facility that pt would return to that facility when medically discharged.  Pt with cognitive impairments that limit engagement.  TOC will follow to assist with SNF return.  Expected Discharge Plan: Long Term Nursing Home Barriers to Discharge: Continued Medical Work up   Patient Goals and CMS Choice        Expected Discharge Plan and Services Expected Discharge Plan: Long Term Nursing Home In-house Referral: Clinical Social Work     Living arrangements for the past 2 months: Skilled Nursing Facility                 DME Arranged: N/A DME Agency: NA                  Prior Living Arrangements/Services Living arrangements for the past 2 months: Skilled Nursing Facility Lives with:: Facility Resident Patient language and need for interpreter reviewed:: Yes        Need for Family Participation in Patient Care: No (Comment) Care giver support system in place?: Yes (comment)   Criminal Activity/Legal Involvement Pertinent to Current Situation/Hospitalization: No - Comment as needed  Activities of Daily Living   ADL Screening (condition at time of admission) Patient's cognitive ability adequate to safely complete daily activities?: No Is the patient deaf or have difficulty hearing?: No Does the patient have difficulty concentrating, remembering, or making decisions?: Yes Patient able to express need for assistance with ADLs?: No Weakness of Legs: Both Weakness of Arms/Hands: None  Permission Sought/Granted Permission sought to share information with : Passenger transport manager granted to share information with : Yes, Verbal Permission Granted     Permission granted to share info w AGENCY: [facility]        Emotional Assessment Appearance:: Appears older than stated age Attitude/Demeanor/Rapport: Unable to Assess Affect (typically observed): Unable to Assess Orientation: : Oriented to Self Alcohol / Substance Use: Not Applicable Psych Involvement: No (comment)  Admission diagnosis:  PEG (percutaneous endoscopic gastrostomy) adjustment/replacement/removal (HCC) [Z43.1] Patient Active Problem List   Diagnosis Date Noted   PEG (percutaneous endoscopic gastrostomy) adjustment/replacement/removal (HCC) 08/13/2022   Schizoaffective disorder (HCC)    Cognitive communication deficit    Intertrochanteric fracture of right femur, closed, initial encounter (HCC) 10/28/2020   Normocytic anemia 10/28/2020   Moderate protein malnutrition (HCC) 10/28/2020   Protein-calorie malnutrition, severe 04/05/2020   AKI (acute kidney injury) (HCC) 04/02/2020   Acute metabolic encephalopathy 04/02/2020   Acute encephalopathy 03/01/2020   Asthma 03/01/2020   Dehydration 03/01/2020   Acute kidney injury superimposed on CKD (HCC) 03/01/2020   Hyperammonemia (HCC) 03/01/2020   Elevated serum hCG 03/01/2020   Hypermagnesemia 03/01/2020   Fall 03/01/2020   Vomiting 03/01/2020   Essential hypertension 03/01/2020   AMS (altered mental status) 03/01/2020   Chest pain 11/03/2018   Asthma with acute exacerbation 01/14/2013   Type 2 diabetes mellitus (HCC) 01/14/2013   Dyslipidemia 01/14/2013   PCP:  [name], NP Pharmacy:   [facility] - [place], [place] - [address] [phone]  [address] [place] [address] Phone: [phone] Fax: [phone]     Social Determinants of Health (SDOH) Interventions    Readmission Risk Interventions     No data to display

## 2022-08-14 NOTE — NC FL2 (Signed)
[place] MEDICAID FL2 LEVEL OF CARE FORM     IDENTIFICATION  Patient Name: Tamara [name] Birthdate: 01-26-1961 Sex: female Admission Date (Current Location): 08/12/2022  [place] and [place] Number:  Haynes Bast [id number] Facility and Address:  [facility],  [address], [place] [address]      Provider Number: [id number]  Attending Physician Name and Address:  Lorin Glass, MD  Relative Name and Phone Number:  niece, Mason Jim [phone]    Current Level of Care: Hospital Recommended Level of Care: Nursing Facility Prior Approval Number:    Date Approved/Denied:   PASRR Number: [id number]  Discharge Plan: SNF    Current Diagnoses: Patient Active Problem List   Diagnosis Date Noted   PEG (percutaneous endoscopic gastrostomy) adjustment/replacement/removal (HCC) 08/13/2022   Schizoaffective disorder (HCC)    Cognitive communication deficit    Intertrochanteric fracture of right femur, closed, initial encounter (HCC) 10/28/2020   Normocytic anemia 10/28/2020   Moderate protein malnutrition (HCC) 10/28/2020   Protein-calorie malnutrition, severe 04/05/2020   AKI (acute kidney injury) (HCC) 04/02/2020   Acute metabolic encephalopathy 04/02/2020   Acute encephalopathy 03/01/2020   Asthma 03/01/2020   Dehydration 03/01/2020   Acute kidney injury superimposed on CKD (HCC) 03/01/2020   Hyperammonemia (HCC) 03/01/2020   Elevated serum hCG 03/01/2020   Hypermagnesemia 03/01/2020   Fall 03/01/2020   Vomiting 03/01/2020   Essential hypertension 03/01/2020   AMS (altered mental status) 03/01/2020   Chest pain 11/03/2018   Asthma with acute exacerbation 01/14/2013   Type 2 diabetes mellitus (HCC) 01/14/2013   Dyslipidemia 01/14/2013    Orientation RESPIRATION BLADDER Height & Weight     Self    Incontinent, Indwelling catheter Weight:   Height:     BEHAVIORAL SYMPTOMS/MOOD NEUROLOGICAL BOWEL NUTRITION STATUS      Continent Feeding tube   AMBULATORY STATUS COMMUNICATION OF NEEDS Skin   Total Care Verbally Other (Comment) (sugical incision with new PEG)                       Personal Care Assistance Level of Assistance  Bathing, Feeding, Dressing, Total care Bathing Assistance: Limited assistance Feeding assistance: Limited assistance Dressing Assistance: Limited assistance Total Care Assistance: Maximum assistance   Functional Limitations Info  Sight, Hearing, Speech Sight Info: Adequate Hearing Info: Adequate Speech Info: Impaired    SPECIAL CARE FACTORS FREQUENCY                       Contractures Contractures Info: Not present    Additional Factors Info  Code Status, Allergies Code Status Info: Full Allergies Info: chlorhexidine           Current Medications (08/14/2022):  This is the current hospital active medication list Current Facility-Administered Medications  Medication Dose Route Frequency Provider Last Rate Last Admin   0.45 % sodium chloride infusion   Intravenous Continuous Bobette Mo, MD 75 mL/hr at 08/13/22 1936 New Bag at 08/13/22 1936   acetaminophen (TYLENOL) suppository 650 mg  650 mg Rectal Q6H PRN Bobette Mo, MD       dextrose 50 % solution 25 g  25 g Intravenous PRN Bobette Mo, MD       levETIRAcetam (KEPPRA) IVPB 500 mg/100 mL premix  500 mg Intravenous Q12H Bobette Mo, MD 400 mL/hr at 08/14/22 1123 500 mg at 08/14/22 1123   ondansetron (ZOFRAN) injection 4 mg  4 mg Intravenous Q6H PRN Bobette Mo, MD  Discharge Medications: Please see discharge summary for a list of discharge medications.  Relevant Imaging Results:  Relevant Lab Results:   Additional Information [id number]  Lennart [place], LCSW

## 2022-08-14 NOTE — Progress Notes (Signed)
PROGRESS NOTE  Prima Krawczyk  DOB: 04/08/1961  PCP: [name], NP FFM:[id number]  DOA: 08/12/2022  LOS: 0 days  Hospital Day: 3  Brief narrative: Tamara [name] is a 61 y.o. female with PMH significant for DM2, HTN, oropharyngeal dysphagia s/p PEG tube placement, asthma, cognitive communication deficit, seizures, schizophrenia, depression, generalized muscle weakness who is a resident at [facility] nursing facility. 12/16, while cleaning her, staff noted that the PEG tube was missing and hence she was sent to the ED.  In the ED, patient was afebrile, breathing on room air, blood pressure elevated to 170s Labs unremarkable IR was consulted for Admitted to [facility].   Subjective: Patient was seen and examined this morning.  Pleasant middle-aged African-American female.  Lying on bed.  Alert, awake, smacking her tongue in and out.  Seems to have baseline psychiatric issues limiting her physical and mental ability. Chart reviewed Blood pressure elevated to 140s and 150s this morning Labs remain stable  Assessment and plan: PEG tube dislodgment Unclear how PEG tube got dislodged. IR to insert new PEG tube today Currently n.p.o. and answers all medicines are on hold   Type 2 diabetes mellitus A1c 6.2 in 2021.  Blood sugar level not elevated PTA on metformin 1000 mg twice daily.  Currently on hold Currently n.p.o. Continue sliding scale insulin tactics Recent Labs  Lab 08/13/22 1722 08/14/22 0207 08/14/22 0639 08/14/22 1138  GLUCAP 83 88 84 82   Essential hypertension PTA on losartan 100 mg daily,  Dyslipidemia PTA on aspirin 81 mg daily, Lipitor 40 mg daily  History of seizure PTA on Keppra 500 mg twice daily  Schizoaffective disorder PTA on Wellbutrin 100 mg daily,  Asthma PTA on Advair twice daily  Goals of care   Code Status: Full Code    Mobility: Unclear baseline mobility status  Scheduled Meds:   PRN meds: [DISCONTINUED] acetaminophen **OR**  acetaminophen, dextrose, [DISCONTINUED] ondansetron **OR** ondansetron (ZOFRAN) IV   Infusions:   sodium chloride 75 mL/hr at 08/13/22 1936   levETIRAcetam 500 mg (08/14/22 1123)    Skin assessment:     Nutritional status:  There is no height or weight on file to calculate BMI.          Diet:  Diet Order             Diet NPO time specified  Diet effective midnight                   DVT prophylaxis:  SCDs Start: 08/13/22 1321   Antimicrobials: None Fluid: NS at 75 mill per hour Consultants: IR Family Communication: None at bedside.  Called and discussed with her niece/POA Tyrell this afternoon  Status is: Observation  Continue in-hospital care because: Needs new PEG tube placement Level of care: Med-Surg   Dispo: The patient is from: nursing facility              Anticipated d/c is to: Pending clinical course              Patient currently is not medically stable to d/c.   Difficult to place patient No    Antimicrobials: Anti-infectives (From admission, onward)    None       Objective: Vitals:   08/14/22 0926 08/14/22 1343  BP: (!) 156/94 (!) 148/87  Pulse: 80 79  Resp: 17 16  Temp: 97.7 F (36.5 C) (!) 97.5 F (36.4 C)  SpO2: 99% 95%    Intake/Output Summary (Last 24 hours)  at 08/14/2022 1350 Last data filed at 08/14/2022 1139 Gross per 24 hour  Intake 1083.08 ml  Output 1000 ml  Net 83.08 ml   There were no vitals filed for this visit. Weight change:  There is no height or weight on file to calculate BMI.   Physical Exam: General exam: Pleasant middle-aged African-American female.  Not in physical distress Skin: No rashes, lesions or ulcers. HEENT: Atraumatic, normocephalic, no obvious bleeding Lungs: Clear to auscultation bilaterally CVS: Regular rate and rhythm, no murmur GI/Abd soft, nontender, nondistended old PEG tube site seems to healed CNS: Alert, awake, unable to have a meaningful conversation with me  today Psychiatry: Mood appropriate Extremities: No pedal edema, no calf tenderness  Data Review: I have personally reviewed the laboratory data and studies available.  F/u labs ordered Unresulted Labs (From admission, onward)    None       Total time spent in review of labs and imaging, patient evaluation, formulation of plan, documentation and communication with family - 45 minutes  Signed, Lorin Glass, MD [facility] 08/14/2022

## 2022-08-14 NOTE — Procedures (Signed)
Interventional Radiology Procedure Note  Procedure: Image guided rescue of displaced gtube.  New 4F balloon retention gastrostomy.   Complications: None  Recommendations: - OK to use   - routine wound care  Signed,  Yvone Neu. Loreta Ave, DO

## 2022-08-15 DIAGNOSIS — Z431 Encounter for attention to gastrostomy: Secondary | ICD-10-CM | POA: Diagnosis not present

## 2022-08-15 LAB — GLUCOSE, CAPILLARY
Glucose-Capillary: 140 mg/dL — ABNORMAL HIGH (ref 70–99)
Glucose-Capillary: 155 mg/dL — ABNORMAL HIGH (ref 70–99)
Glucose-Capillary: 200 mg/dL — ABNORMAL HIGH (ref 70–99)
Glucose-Capillary: 202 mg/dL — ABNORMAL HIGH (ref 70–99)
Glucose-Capillary: 70 mg/dL (ref 70–99)

## 2022-08-15 MED ORDER — JEVITY 1.2 CAL PO LIQD
237.0000 mL | Freq: Four times a day (QID) | ORAL | Status: DC
  Administered 2022-08-15 – 2022-08-16 (×6): 237 mL
  Filled 2022-08-15 (×7): qty 237

## 2022-08-15 NOTE — Progress Notes (Signed)
Initial Nutrition Assessment  DOCUMENTATION CODES:   Severe malnutrition in context of chronic illness  INTERVENTION:   -Recommend speech therapy consult to see if diet can be advanced beyond dysphagia 1 safely.   -Resume bolus feeding regimen via PEG to allow for PO intakes -Provide 1 carton (237 ml) Jevity 1.2 QID  -Flush with 30 ml free water before and after each feeding -Provides 1140 kcals, 52g protein and 1004 ml H2O  -Free water recommendation: 240 ml TID via PO or PEG  -Magic cup BID with meals, each supplement provides 290 kcal and 9 grams of protein   -Needs weight for this admission -daily weights ordered  -If pt is unable to take in PO: goal will be 6 cartons of Jevity 1.2 daily via PEG. 30 ml free water before and after each feeding. Providing 1710 kcals, 79g protein and 1506 ml H2O  NUTRITION DIAGNOSIS:   Severe Malnutrition related to chronic illness, dysphagia as evidenced by severe fat depletion, severe muscle depletion.  GOAL:   Patient will meet greater than or equal to 90% of their needs  MONITOR:   PO intake, Supplement acceptance, Labs, Weight trends, TF tolerance, I & O's  REASON FOR ASSESSMENT:   Consult Assessment of nutrition requirement/status (TF)  ASSESSMENT:   61 y.o. female with PMH significant for DM2, HTN, oropharyngeal dysphagia s/p PEG tube placement, asthma, cognitive communication deficit, seizures, schizophrenia, depression, generalized muscle weakness who is a resident at [facility] nursing facility.  12/16, while cleaning her, staff noted that the PEG tube was missing and hence she was sent to the ED.  12/18: new gastrostomy placed by IR  Patient in room, tech at bedside. Pt disoriented but did respond to some of RD's questions. Denies swallowing difficulty and screaming for food. Just completed 100% of her breakfast tray of eggs, applesauce, OJ and milk (provides ~580 kcals, 19g protein). Added Magic cups to come on lunch and  dinner trays. Pt very hungry. Placed lunch tray order per patient request. May need speech evaluation to assess if pt is able to swallow safely.  Will resume bolus regimen to allow pt to eat. Placed recommendations if pt needs tube feeding to provide 100% of nutrition.   Last recorded weight on 05/17/22. Needs weight for admission. Daily weights ordered with TF protocol.   Medications reviewed.  Labs reviewed: CBGs: 70-88   NUTRITION - FOCUSED PHYSICAL EXAM:  Flowsheet Row Most Recent Value  Orbital Region Mild depletion  Upper Arm Region Severe depletion  Thoracic and Lumbar Region Severe depletion  Buccal Region Moderate depletion  Temple Region Moderate depletion  Clavicle Bone Region Moderate depletion  Clavicle and Acromion Bone Region Moderate depletion  Scapular Bone Region Severe depletion  Dorsal Hand Moderate depletion  Patellar Region Severe depletion  Anterior Thigh Region Severe depletion  Posterior Calf Region Severe depletion  Edema (RD Assessment) None  Hair Reviewed  Eyes Reviewed  Mouth Reviewed  [edentulous]  Skin Reviewed  Nails Reviewed       Diet Order:   Diet Order             DIET - DYS 1 Room service appropriate? Yes; Fluid consistency: Thin  Diet effective now                   EDUCATION NEEDS:   No education needs have been identified at this time  Skin:  Skin Assessment: Reviewed RN Assessment  Last BM:  PTA  Height:   Ht Readings  from Last 1 Encounters:  05/17/22 5\' 7"  (1.702 m)    Weight:   Wt Readings from Last 1 Encounters:  05/17/22 44.3 kg    BMI:  There is no height or weight on file to calculate BMI.  Estimated Nutritional Needs:   Kcal:  1700-1900  Protein:  90-100g  Fluid:  1.7L/day  Clayton Bibles, MS, RD, LDN Inpatient Clinical Dietitian Contact information available via Amion

## 2022-08-15 NOTE — Evaluation (Signed)
Clinical/Bedside Swallow Evaluation Patient Details  Name: Tamara [name] MRN: [id number] Date of Birth: February 10, 1961  Today's Date: 08/15/2022 Time: SLP Start Time (ACUTE ONLY): 1605 SLP Stop Time (ACUTE ONLY): 1640 SLP Time Calculation (min) (ACUTE ONLY): 35 min  Past Medical History:  Past Medical History:  Diagnosis Date   Asthma    Cognitive communication deficit    COVID-19    Diabetes mellitus without complication (HCC)    Dysphagia, oropharyngeal phase    Hypertension    Major depressive disorder with single episode    Memory changes    Metabolic encephalopathy    Muscle weakness (generalized)    Other abnormalities of gait and mobility    Renal disorder    Schizoaffective disorder (HCC)    Seizures (HCC)    Unspecified sequelae of unspecified cerebrovascular disease    Unspecified severe protein-calorie malnutrition (HCC)    Unsteadiness on feet    Past Surgical History:  Past Surgical History:  Procedure Laterality Date   INTRAMEDULLARY (IM) NAIL INTERTROCHANTERIC Right 10/29/2020   Procedure: INTRAMEDULLARY (IM) NAIL INTERTROCHANTRIC;  Surgeon: Yolonda Kida, MD;  Location: [facility] OR;  Service: Orthopedics;  Laterality: Right;   IR GASTROSTOMY TUBE MOD SED  04/19/2020   IR REPLC GASTRO/COLONIC TUBE PERCUT W/FLUORO  08/14/2022   HPI:  pt is a 61 yo female adm to [facility] after PEG tube found dislodged. Pt resides at [facility] - has h/o schizoaffective disorder, thalamic CVAs, generalized muscle weakness, received PEG tube feeding at facility.  Chest imaging negative.    Assessment / Plan / Recommendation  Clinical Impression  SLP attempted to call the SNF to obtain premorbid diet information with no answer on phone - will reattempt.    At this time, pt greeted - she was fully alert and able to articulate that she wanted more to eat throughout the entire session. Significant excessive lingual/facial movement consistent with tardive dyskinesia noted with lingual  deviation to the right upon protrusion.  She has mild dysarthria but is intelligible for her short repetitive phrases that she states.  Prolonged swallow noted with pt needing up to 14 seconds to swallow single bolus of jello, puree or cracker - with oral retention without awareness.  SLP had to take a flashlight to observe retention as pt repeatedly states "Give me more" when she has not cleared.  Most efficient swallow is noted with liquid boluses, thus safest consistency at this time.  Straw use is encouraged.  No family present to establish baseline - and pt swallow must be observed *by observing laryngeal elevation* and oral cavity checked using flashlight to assure adequate clearance for her protection.   Uncertain if she is on diet PTA and will follow up next date. Informed RN and NT of recommendations. NT reports pt was not orally retaining boluses with lunch - and pt has had pain medicine and slept since lunch - Advised to need for strict precautions as outlined above as pt has NO awareness to retention. SLP Visit Diagnosis: Dysphagia, oral phase (R13.11);Dysphagia, unspecified (R13.10)    Aspiration Risk  Mild aspiration risk;Other (comment) (with strict precautions, risk is mild)    Diet Recommendation Thin liquid (FULL LIQUID, NO PUDDING NOR GRITS)   Liquid Administration via: Straw Medication Administration: Via alternative means Compensations: Slow rate;Small sips/bites (USE FLASHLIGHT to assure oral clearance please)    Other  Recommendations Oral Care Recommendations: Oral care QID    Recommendations for follow up therapy are one component of a multi-disciplinary  discharge planning process, led by the attending physician.  Recommendations may be updated based on patient status, additional functional criteria and insurance authorization.  Follow up Recommendations Follow physician's recommendations for discharge plan and follow up therapies      Assistance Recommended at Discharge   Full   Functional Status Assessment Patient has had a recent decline in their functional status and demonstrates the ability to make significant improvements in function in a reasonable and predictable amount of time.  Frequency and Duration min 1 x/week  1 week       Prognosis Prognosis for Safe Diet Advancement: Fair Barriers to Reach Goals:  (uncertain to baseline,no mention of TD or edentulous status during prior exams in 2021)      Swallow Study   General Date of Onset: 08/15/22 HPI: pt is a 61 yo female adm to [facility] after PEG tube found dislodged. Pt resides at [facility] - has h/o schizoaffective disorder, thalamic CVAs, generalized muscle weakness, received PEG tube feeding at facility.  Chest imaging negative. Type of Study: Bedside Swallow Evaluation Previous Swallow Assessment: prior BSE 2021, pt refused solids, had dentition and no mention of significant TD Diet Prior to this Study: Thin liquids;Dysphagia 1 (puree) Temperature Spikes Noted: No Respiratory Status: Room air History of Recent Intubation: No Behavior/Cognition: Alert;Requires cueing;Other (Comment) (inconsistently follows directions) Oral Care Completed by SLP: Yes Oral Cavity - Dentition: Edentulous Vision: Impaired for self-feeding Self-Feeding Abilities: Total assist Patient Positioning: Upright in bed Baseline Vocal Quality: Normal Volitional Cough: Cognitively unable to elicit Volitional Swallow: Unable to elicit    Oral/Motor/Sensory Function Overall Oral Motor/Sensory Function: Other (comment) (lingual deviation to the right with protrusion observed, oral pocketing on left)   Ice Chips     Thin Liquid Thin Liquid: Impaired Presentation: Straw Oral Phase Impairments: Reduced lingual movement/coordination;Reduced labial seal Other Comments: suspect oral delay of final swallow of sequence    Nectar Thick Nectar Thick Liquid: Impaired Oral Phase Impairments: Reduced lingual  movement/coordination;Reduced labial seal Oral phase functional implications: Oral holding Other Comments: suspect delay in swallow of final swallow of swallow sequence   Honey Thick Honey Thick Liquid: Not tested   Puree Puree: Impaired Presentation: Spoon Oral Phase Impairments: Reduced labial seal;Reduced lingual movement/coordination;Poor awareness of bolus Oral Phase Functional Implications: Left lateral sulci pocketing;Prolonged oral transit Other Comments: delay in swallow up to 14 seconds WITHOUT awareness   Solid     Solid: Impaired Oral Phase Impairments: Reduced labial seal;Reduced lingual movement/coordination;Impaired mastication;Poor awareness of bolus Oral Phase Functional Implications: Oral residue;Left lateral sulci pocketing Pharyngeal Phase Impairments: Suspected delayed Swallow Other Comments: use of liquid to aid oral transiting helpful      Chales Abrahams 08/15/2022,5:05 PM  Rolena Infante, MS [facility] SLP Acute Rehab Services Office [phone] Pager [phone]

## 2022-08-15 NOTE — Progress Notes (Signed)
PROGRESS NOTE  Tamara [name]  DOB: 11/15/1960  PCP: [name], NP WSF:[id number]  DOA: 08/12/2022  LOS: 0 days  Hospital Day: 4  Brief narrative: Tamara [name] is a 61 y.o. female with PMH significant for DM2, HTN, oropharyngeal dysphagia s/p PEG tube placement, asthma, cognitive communication deficit, seizures, schizophrenia, depression, generalized muscle weakness who is a resident at [facility] nursing facility. 12/16, while cleaning her, staff noted that the PEG tube was missing and hence she was sent to the ED.  In the ED, patient was afebrile, breathing on room air, blood pressure elevated to 170s Labs unremarkable IR was consulted for Admitted to [facility].    Subjective: Patient was seen and examined this morning.  Pleasant middle-aged African-American female.  Lying on bed.  Has baseline mental health issues.  States 'I want to eat.' It is unclear to me at this time if she was allowed any oral feeding at the SNF.  I talked to her niece/POA and she was not aware of that either.  Assessment and plan: PEG tube dislodgment Unclear how PEG tube got dislodged. 12/18, underwent new PEG tube insertion by IR. I am not sure if patient at SNF was allowed for any oral feeding Dietitian consulted   Type 2 diabetes mellitus A1c 6.2 in 2021.  Blood sugar level not elevated PTA on metformin 1000 mg twice daily. Currently on hold. Currently n.p.o. Continue sliding scale insulin with Accu-Cheks. Recent Labs  Lab 08/14/22 0639 08/14/22 1138 08/14/22 1753 08/14/22 2334 08/15/22 0602  GLUCAP 84 82 88 81 70   Essential hypertension Continue losartan 100 mg daily  Dyslipidemia PTA on aspirin 81 mg daily, Lipitor 40 mg daily Continue Lipitor.  Resume aspirin at discharge  History of seizure Continue Keppra 500 mg twice daily  Schizoaffective disorder Continue Wellbutrin 100 mg daily,  Asthma Continue Advair twice daily  Goals of care   Code Status: Full Code     Mobility: Unclear baseline mobility status  Scheduled Meds:  atorvastatin  40 mg Per Tube QHS   buPROPion  100 mg Per Tube Daily   levETIRAcetam  500 mg Per Tube BID   losartan  100 mg Per Tube Daily   montelukast  10 mg Per Tube QHS    PRN meds: [DISCONTINUED] acetaminophen **OR** acetaminophen, albuterol, dextrose, [DISCONTINUED] ondansetron **OR** ondansetron (ZOFRAN) IV   Infusions:   sodium chloride 75 mL/hr at 08/14/22 2042    Skin assessment:     Nutritional status:  There is no height or weight on file to calculate BMI.          Diet:  Diet Order             DIET - DYS 1 Room service appropriate? Yes; Fluid consistency: Thin  Diet effective now                   DVT prophylaxis:  SCDs Start: 08/13/22 1321   Antimicrobials: None Fluid: Half NS at 75 mill per hour Consultants: IR Family Communication: None at bedside.  Called and discussed with her niece/POA Tamara [name] yesterday. Status is: Observation  Continue in-hospital care because: Pending dietitian eval Level of care: Med-Surg   Dispo: The patient is from: nursing facility              Anticipated d/c is to: Pending clinical course              Patient currently is not medically stable to d/c.   Difficult to  place patient No    Antimicrobials: Anti-infectives (From admission, onward)    None       Objective: Vitals:   08/15/22 0603 08/15/22 0957  BP: (!) 140/123 (!) 154/101  Pulse: 76 77  Resp: 18 16  Temp: 98.4 F (36.9 C) 98.6 F (37 C)  SpO2: 100% 100%    Intake/Output Summary (Last 24 hours) at 08/15/2022 1038 Last data filed at 08/15/2022 1017 Gross per 24 hour  Intake 1891.33 ml  Output 1750 ml  Net 141.33 ml   There were no vitals filed for this visit. Weight change:  There is no height or weight on file to calculate BMI.   Physical Exam: General exam: Pleasant middle-aged African-American female.  Not in physical distress Skin: No rashes, lesions or  ulcers. HEENT: Atraumatic, normocephalic, no obvious bleeding Lungs: Clear to auscultation bilaterally CVS: Regular rate and rhythm, no murmur GI/Abd soft, nontender, nondistended old PEG tube site seems to healed CNS: Alert, awake, able to ask for food.  Inconsistently follows motor command Psychiatry: Mood appropriate Extremities: No pedal edema, no calf tenderness  Data Review: I have personally reviewed the laboratory data and studies available.  F/u labs ordered Unresulted Labs (From admission, onward)    None       Total time spent in review of labs and imaging, patient evaluation, formulation of plan, documentation and communication with family - 45 minutes  Signed, Lorin Glass, MD [facility] 08/15/2022

## 2022-08-16 DIAGNOSIS — F259 Schizoaffective disorder, unspecified: Secondary | ICD-10-CM | POA: Diagnosis not present

## 2022-08-16 DIAGNOSIS — I10 Essential (primary) hypertension: Secondary | ICD-10-CM

## 2022-08-16 DIAGNOSIS — E785 Hyperlipidemia, unspecified: Secondary | ICD-10-CM | POA: Diagnosis not present

## 2022-08-16 DIAGNOSIS — Z431 Encounter for attention to gastrostomy: Secondary | ICD-10-CM | POA: Diagnosis not present

## 2022-08-16 LAB — GLUCOSE, CAPILLARY
Glucose-Capillary: 137 mg/dL — ABNORMAL HIGH (ref 70–99)
Glucose-Capillary: 103 mg/dL — ABNORMAL HIGH (ref 70–99)
Glucose-Capillary: 181 mg/dL — ABNORMAL HIGH (ref 70–99)

## 2022-08-16 LAB — MAGNESIUM: Magnesium: 1.7 mg/dL (ref 1.7–2.4)

## 2022-08-16 LAB — PHOSPHORUS: Phosphorus: 2.7 mg/dL (ref 2.5–4.6)

## 2022-08-16 MED ORDER — TRAMADOL HCL 50 MG PO TABS: 50.0000 mg | ORAL_TABLET | Freq: Four times a day (QID) | ORAL | 0 refills | Status: AC | PRN

## 2022-08-16 MED ORDER — INSULIN ASPART 100 UNIT/ML IJ SOLN: 0.0000 [IU] | Freq: Three times a day (TID) | INTRAMUSCULAR | Status: DC

## 2022-08-16 MED ORDER — MIRALAX 17 GM/SCOOP PO POWD: 17.0000 g | Freq: Every day | ORAL | 0 refills | Status: AC | PRN

## 2022-08-16 MED ORDER — INSULIN ASPART 100 UNIT/ML IJ SOLN: 0.0000 [IU] | Freq: Three times a day (TID) | INTRAMUSCULAR | 11 refills | Status: AC

## 2022-08-16 NOTE — Discharge Summary (Addendum)
Physician Discharge Summary   Patient: Tamara [name] MRN: [id number] DOB: Jun 20, 1961  Admit date:     08/12/2022  Discharge date: 08/16/22  Discharge Physician: Oswald Hillock   PCP: Marliss Coots, NP   Recommendations at discharge:    Dysphagia 1 diet with thin liquids    Discharge Diagnoses: Principal Problem:   PEG (percutaneous endoscopic gastrostomy) adjustment/replacement/removal ([place]) Active Problems:   Type 2 diabetes mellitus ([place])   Dyslipidemia   Essential hypertension   Normocytic anemia   Schizoaffective disorder ([place])  Resolved Problems:   * No resolved hospital problems. *  Hospital Course:  61 y.o. female with PMH significant for DM2, HTN, oropharyngeal dysphagia s/p PEG tube placement, asthma, cognitive communication deficit, seizures, schizophrenia, depression, generalized muscle weakness who is a resident at [facility] facility. 12/16, while cleaning her, staff noted that the PEG tube was missing and hence she was sent to the ED.  Assessment and Plan:  PEG tube dislodgment Unclear how PEG tube got dislodged. 12/18, underwent new PEG tube insertion by IR. Speech therapy consulted, patient started on  Dysphagia 1 diet /Thin liquid         Type 2 diabetes mellitus A1c 6.2 in 2021.  Blood sugar level not elevated Continue metformin -CBG was elevated after starting nutrition with PEG tube and oral diet. -Will start sliding scale insulin with NovoLog, check CBG 3 times daily   Essential hypertension Continue losartan 100 mg daily   Dyslipidemia PTA on aspirin 81 mg daily, Lipitor 40 mg daily Continue Lipitor, aspirin   History of seizure Continue Keppra 500 mg twice daily   Schizoaffective disorder Continue Wellbutrin 100 mg daily,   Asthma Continue Advair twice daily      Consultants:  Procedures performed:  Disposition: Skilled nursing facility Diet recommendation:  Discharge Diet Orders (From admission, onward)      Start     Ordered   08/16/22 0000  Diet - low sodium heart healthy        08/16/22 1139           Full liquid diet DISCHARGE MEDICATION: Allergies as of 08/16/2022       Reactions   Chlorhexidine Other (See Comments)   "Allergic," per [facility]        Medication List     TAKE these medications    Acetaminophen 500 MG capsule Place 2 capsules (1,000 mg total) into feeding tube 3 (three) times daily as needed for pain.   albuterol 108 (90 Base) MCG/ACT inhaler Commonly known as: VENTOLIN HFA Inhale 2 puffs into the lungs every 6 (six) hours as needed for wheezing.   aspirin 81 MG chewable tablet Commonly known as: Aspirin Childrens Place 1 tablet (81 mg total) into feeding tube daily.   atorvastatin 40 MG tablet Commonly known as: LIPITOR Place 40 mg into feeding tube at bedtime. What changed: Another medication with the same name was removed. Continue taking this medication, and follow the directions you see here.   buPROPion ER 100 MG 12 hr tablet Commonly known as: WELLBUTRIN SR 100 mg See admin instructions. 100 mg, per tube, once a day   docusate sodium 100 MG capsule Commonly known as: COLACE Take 1 capsule (100 mg total) by mouth 2 (two) times daily. What changed:  how to take this when to take this additional instructions   feeding supplement (JEVITY 1.2 CAL) Liqd 237 mLs See admin instructions. 237 ml's every four hours and flush with 30 ml's of water  before and after each administration   feeding supplement (OSMOLITE 1.5 CAL) Liqd Place 1,000 mLs into feeding tube continuous.   feeding supplement (PROSource TF) liquid Place 45 mLs into feeding tube 3 (three) times daily.   Fluticasone-Salmeterol 250-50 MCG/DOSE Aepb Commonly known as: ADVAIR Inhale 1 puff into the lungs in the morning and at bedtime.   folic acid 1 MG tablet Commonly known as: FOLVITE Place 1 tablet (1 mg total) into feeding tube daily.   free water Soln Place 200 mLs into  feeding tube every 8 (eight) hours.   insulin aspart 100 UNIT/ML injection Commonly known as: novoLOG Inject 0-9 Units into the skin 3 (three) times daily with meals. Sliding scale insulin Less than 70 initiate hypoglycemia protocol 70-120  0 units 120-150 1 unit 151-200 2 units 201-250 3 units 251-300 5 units 301-350 7 units 351-400 9 units  Greater than 400 call MD   levETIRAcetam 100 MG/ML solution Commonly known as: KEPPRA Place 5 mLs (500 mg total) into feeding tube 2 (two) times daily.   loratadine 10 MG tablet Commonly known as: CLARITIN Place 1 tablet (10 mg total) into feeding tube daily.   losartan 100 MG tablet Commonly known as: COZAAR Place 1 tablet (100 mg total) into feeding tube daily.   magnesium 30 MG tablet Place 1 tablet (30 mg total) into feeding tube daily.   melatonin 5 MG Tabs Place 5 mg into feeding tube at bedtime.   metFORMIN 1000 MG tablet Commonly known as: GLUCOPHAGE Place 1 tablet (1,000 mg total) into feeding tube 2 (two) times daily with a meal.   MiraLax 17 GM/SCOOP powder Generic drug: polyethylene glycol powder Place 17 g into feeding tube daily as needed for moderate constipation. What changed:  when to take this reasons to take this   montelukast 10 MG tablet Commonly known as: SINGULAIR Place 1 tablet (10 mg total) into feeding tube at bedtime.   multivitamin with minerals Tabs tablet Place 1 tablet into feeding tube daily.   Quercetin 250 MG Tabs Give 250 mg by tube in the morning and at bedtime.   thiamine 100 MG tablet Commonly known as: VITAMIN B1 Place 1 tablet (100 mg total) into feeding tube daily.   traMADol 50 MG tablet Commonly known as: ULTRAM Place 1 tablet (50 mg total) into feeding tube every 6 (six) hours as needed (for pain).   Vitamin D3 50 MCG (2000 UT) Tabs Place 2,000 Units into feeding tube daily.        Contact information for after-discharge care     [facility] .   Service: Skilled Nursing Contact information: [address] [place] [phone]                    Discharge Exam: Danley Danker Weights   08/16/22 0401  Weight: 58.3 kg   General-appears in no acute distress Heart-S1-S2, regular, no murmur auscultated Lungs-clear to auscultation bilaterally, no wheezing or crackles auscultated Abdomen-soft, nontender, no organomegaly, PEG tube in place Extremities-no edema in the lower extremities Neuro-alert, moving all extremities  Condition at discharge: good  The results of significant diagnostics from this hospitalization (including imaging, microbiology, ancillary and laboratory) are listed below for reference.   Imaging Studies: IR REPLACE GASTRO/JEJUNO TUBE PERC W/FLUORO  Result Date: 08/14/2022 INDICATION: 61 year old female referred for rescue of displaced gastrostomy EXAM: IMAGE GUIDED PLACEMENT GASTROSTOMY MEDICATIONS: None ANESTHESIA/SEDATION: Moderate (conscious) sedation was employed during this  procedure. A total of Versed 2.0 mg and Fentanyl 100 mcg was administered intravenously by the radiology nurse. Total intra-service moderate Sedation Time: 10 minutes. The patient's level of consciousness and vital signs were monitored continuously by radiology nursing throughout the procedure under my direct supervision. CONTRAST:  10 cc-administered into the gastric lumen. FLUOROSCOPY: Radiation Exposure Index (as provided by the fluoroscopic device): 4 mGy Kerma COMPLICATIONS: None PROCEDURE: Informed written consent was obtained from the patient and the patient's family after a thorough discussion of the procedural risks, benefits and alternatives. All questions were addressed. Maximal Sterile Barrier Technique was utilized including caps, mask, sterile gowns, sterile gloves, sterile drape, hand hygiene and skin antiseptic. A timeout was performed prior to the initiation of the procedure. Patient was  positioned supine under the image intensifier. The upper abdomen was prepped and draped in the usual sterile fashion. Contrast was injected through the stoma confirming and open tract to the stomach. A Kumpe the catheter was navigated into the stomach lumen and contrast injected confirmed location. Guidewire is placed in the stomach and then modified Seldinger technique was used to place an 18 [place] balloon retention gastrostomy. 6 cc of saline inflated into the balloon for retention. Contrast injection confirmed location. Patient tolerated the procedure well and remained hemodynamically stable throughout. No complications were encountered and no significant blood loss. IMPRESSION: Image guided rescue of displaced gastrostomy. New 18 French balloon retention gastrostomy. Signed, Dulcy Fanny. Nadene Rubins, RPVI [facility] Rad[facility] Electronically Signed   By: Corrie Mckusick D.O.   On: 08/14/2022 16:53    Microbiology: Results for orders placed or performed during the hospital encounter of 10/28/20  Resp Panel by RT-PCR (Flu A&B, Covid) Nasopharyngeal Swab     Status: None   Collection Time: 10/28/20 10:00 PM   Specimen: Nasopharyngeal Swab; Nasopharyngeal(NP) swabs in vial transport medium  Result Value Ref Range Status   SARS Coronavirus 2 by RT PCR NEGATIVE NEGATIVE Final    Comment: (NOTE) SARS-CoV-2 target nucleic acids are NOT DETECTED.  The SARS-CoV-2 RNA is generally detectable in upper respiratory specimens during the acute phase of infection. The lowest concentration of SARS-CoV-2 viral copies this assay can detect is 138 copies/mL. A negative result does not preclude SARS-Cov-2 infection and should not be used as the sole basis for treatment or other patient management decisions. A negative result may occur with  improper specimen collection/handling, submission of specimen other than nasopharyngeal swab, presence of viral mutation(s)  within the areas targeted by this assay, and inadequate number of viral copies(<138 copies/mL). A negative result must be combined with clinical observations, patient history, and epidemiological information. The expected result is Negative.  Fact Sheet for Patients:  EntrepreneurPulse.com.au  Fact Sheet for Healthcare Providers:  IncredibleEmployment.be  This test is no t yet approved or cleared by the [place] FDA and  has been authorized for detection and/or diagnosis of SARS-CoV-2 by FDA under an Emergency Use Authorization (EUA). This EUA will remain  in effect (meaning this test can be used) for the duration of the COVID-19 declaration under Section 564(b)(1) of the Act, 21 U.S.C.section 360bbb-3(b)(1), unless the authorization is terminated  or revoked sooner.       Influenza A by PCR NEGATIVE NEGATIVE Final   Influenza B by PCR NEGATIVE NEGATIVE Final    Comment: (NOTE) The Xpert Xpress SARS-CoV-2/FLU/RSV plus assay is intended as an aid in the diagnosis of influenza from Nasopharyngeal swab specimens and should not be used  as a sole basis for treatment. Nasal washings and aspirates are unacceptable for Xpert Xpress SARS-CoV-2/FLU/RSV testing.  Fact Sheet for Patients: EntrepreneurPulse.com.au  Fact Sheet for Healthcare Providers: IncredibleEmployment.be  This test is not yet approved or cleared by the [place] FDA and has been authorized for detection and/or diagnosis of SARS-CoV-2 by FDA under an Emergency Use Authorization (EUA). This EUA will remain in effect (meaning this test can be used) for the duration of the COVID-19 declaration under Section 564(b)(1) of the Act, 21 U.S.C. section 360bbb-3(b)(1), unless the authorization is terminated or revoked.  Performed at [facility], [place] [address] [place], [place] [address]   Surgical pcr screen     Status: None    Collection Time: 10/29/20  8:47 AM   Specimen: Nasal Mucosa; Nasal Swab  Result Value Ref Range Status   MRSA, PCR NEGATIVE NEGATIVE Final   Staphylococcus aureus NEGATIVE NEGATIVE Final    Comment: (NOTE) The Xpert SA Assay (FDA approved for NASAL specimens in patients 34 years of age and older), is one component of a comprehensive surveillance program. It is not intended to diagnose infection nor to guide or monitor treatment. Performed at [facility], [place] [address] [place], [place] [address]   SARS CORONAVIRUS 2 (TAT 6-24 HRS) Nasopharyngeal Nasopharyngeal Swab     Status: None   Collection Time: 11/01/20 10:24 AM   Specimen: Nasopharyngeal Swab  Result Value Ref Range Status   SARS Coronavirus 2 NEGATIVE NEGATIVE Final    Comment: (NOTE) SARS-CoV-2 target nucleic acids are NOT DETECTED.  The SARS-CoV-2 RNA is generally detectable in upper and lower respiratory specimens during the acute phase of infection. Negative results do not preclude SARS-CoV-2 infection, do not rule out co-infections with other pathogens, and should not be used as the sole basis for treatment or other patient management decisions. Negative results must be combined with clinical observations, patient history, and epidemiological information. The expected result is Negative.  Fact Sheet for Patients: SugarRoll.be  Fact Sheet for Healthcare Providers: https://www.woods-mathews.com/  This test is not yet approved or cleared by the [place] FDA and  has been authorized for detection and/or diagnosis of SARS-CoV-2 by FDA under an Emergency Use Authorization (EUA). This EUA will remain  in effect (meaning this test can be used) for the duration of the COVID-19 declaration under Se ction 564(b)(1) of the Act, 21 U.S.C. section 360bbb-3(b)(1), unless the authorization is terminated or revoked sooner.  Performed at [facility], [place] [address], [place], [place] [address]     Labs: CBC: Recent Labs  Lab 08/13/22 1009 08/14/22 0557  WBC 8.7 6.9  NEUTROABS 4.8  --   HGB 11.8* 11.5*  HCT 36.3 36.4  MCV 89.9 92.4  PLT 274 [id number]   Basic Metabolic Panel: Recent Labs  Lab 08/13/22 1009 08/14/22 0557 08/16/22 0545  NA 141 138  --   K 4.5 3.7  --   CL 109 108  --   CO2 22 21*  --   GLUCOSE 83 81  --   BUN 16 14  --   CREATININE 0.91 0.83  --   CALCIUM 10.1 9.5  --   MG  --   --  1.7  PHOS  --   --  2.7   Liver Function Tests: Recent Labs  Lab 08/14/22 0557  AST 17  ALT 12  ALKPHOS 100  BILITOT 0.6  PROT 7.4  ALBUMIN 3.7   CBG: Recent  Labs  Lab 08/15/22 1954 08/15/22 2357 08/16/22 0355 08/16/22 0753 08/16/22 1134  GLUCAP 200* 202* 137* 103* 181*    Discharge time spent: greater than 30 minutes.  Signed: Meredeth Ide, MD [facility] 08/16/2022

## 2022-08-16 NOTE — TOC Transition Note (Signed)
Transition of Care [facility]) - CM/SW Discharge Note   Patient Details  Name: Tamara [name] MRN: [id number] Date of Birth: 1961-02-26  [facility] of [facility]) CM/SW Contact:  Amada Jupiter, LCSW Phone Number: 08/16/2022, 1:31 PM   Clinical Narrative:    Pt medically cleared for dc today back to LTC bed at [facility].  Have notified niece who is aware/ agreeable.  PTAR called at 1:30pm.  RN to call report to [phone].  No further TOC needs.   Final next level of care: Skilled Nursing Facility Barriers to Discharge: Barriers Resolved   Patient Goals and CMS Choice          Discharge Placement   Existing PASRR number confirmed : 08/14/22          Patient chooses bed at: Other - please specify in the comment section below: [facility]) Patient to be transferred to facility by: PTAR Name of family member notified: niece, Tyrell Patient and family notified of of transfer: 08/16/22  Discharge Plan and Services In-house Referral: Clinical Social Work              DME Arranged: N/A DME Agency: NA                  Social Determinants of Health (SDOH) Interventions     Readmission Risk Interventions     No data to display

## 2022-08-16 NOTE — Progress Notes (Signed)
Patient was picked up by PTAR who was given the discharge packet. Patient was returning to [facility]. Report was called in the the RN.

## 2022-08-17 LAB — HEMOGLOBIN A1C
Hgb A1c MFr Bld: 6.1 % — ABNORMAL HIGH (ref 4.8–5.6)
Mean Plasma Glucose: 128 mg/dL

## 2022-09-17 ENCOUNTER — Emergency Department (HOSPITAL_COMMUNITY): Payer: Medicaid Other

## 2022-09-17 ENCOUNTER — Emergency Department (HOSPITAL_COMMUNITY)
Admission: EM | Admit: 2022-09-17 | Discharge: 2022-09-17 | Disposition: A | Discharge status: Home / Self Care | Payer: Medicaid Other | Attending: Emergency Medicine | Admitting: Emergency Medicine

## 2022-09-17 ENCOUNTER — Encounter (HOSPITAL_COMMUNITY): Payer: Self-pay | Admitting: Emergency Medicine

## 2022-09-17 DIAGNOSIS — E119 Type 2 diabetes mellitus without complications: Secondary | ICD-10-CM | POA: Diagnosis not present

## 2022-09-17 DIAGNOSIS — J45909 Unspecified asthma, uncomplicated: Secondary | ICD-10-CM | POA: Diagnosis not present

## 2022-09-17 DIAGNOSIS — K9423 Gastrostomy malfunction: Secondary | ICD-10-CM | POA: Diagnosis present

## 2022-09-17 DIAGNOSIS — Z7984 Long term (current) use of oral hypoglycemic drugs: Secondary | ICD-10-CM | POA: Diagnosis not present

## 2022-09-17 DIAGNOSIS — T85528A Displacement of other gastrointestinal prosthetic devices, implants and grafts, initial encounter: Secondary | ICD-10-CM

## 2022-09-17 DIAGNOSIS — Z7951 Long term (current) use of inhaled steroids: Secondary | ICD-10-CM | POA: Diagnosis not present

## 2022-09-17 DIAGNOSIS — Z794 Long term (current) use of insulin: Secondary | ICD-10-CM | POA: Insufficient documentation

## 2022-09-17 MED ORDER — LIDOCAINE HCL URETHRAL/MUCOSAL 2 % EX GEL
1.0000 | Freq: Once | CUTANEOUS | Status: DC
  Filled 2022-09-17: qty 11

## 2022-09-17 NOTE — ED Provider Notes (Signed)
[place] [facility]MENT AT [facility] Provider Note   CSN: [id number] Arrival date & time: 09/17/22  1812     History  No chief complaint on file.   Tamara [name] is a 62 y.o. female with a past medical history significant for hyperlipidemia, schizoaffective disorder, cognitive communication deficit, asthma, and diabetes who presents to the ED due to PEG tube complication.  Patient's PEG tube was dislodged earlier today per EMS.  Unable to reach [facility] living facility for collateral information.  Patient unable to provide history.  Patient at baseline per EMS.   History obtained from patient, EMS and past medical records. No interpreter used during encounter.       Home Medications Prior to Admission medications   Medication Sig Start Date End Date Taking? Authorizing Provider  Acetaminophen 500 MG capsule Place 2 capsules (1,000 mg total) into feeding tube 3 (three) times daily as needed for pain. 04/23/20   Raiford Noble Latif, DO  albuterol (PROVENTIL HFA;VENTOLIN HFA) 108 (90 BASE) MCG/ACT inhaler Inhale 2 puffs into the lungs every 6 (six) hours as needed for wheezing. 10/04/12   Johnson, Clanford L, MD  aspirin (ASPIRIN CHILDRENS) 81 MG chewable tablet Place 1 tablet (81 mg total) into feeding tube daily. 04/23/20   Raiford Noble Latif, DO  atorvastatin (LIPITOR) 40 MG tablet Place 40 mg into feeding tube at bedtime.    [provider]  buPROPion ER (WELLBUTRIN SR) 100 MG 12 hr tablet 100 mg See admin instructions. 100 mg, per tube, once a day    [provider]  Cholecalciferol (VITAMIN D3) 50 MCG (2000 UT) TABS Place 2,000 Units into feeding tube daily.    [provider]  docusate sodium (COLACE) 100 MG capsule Take 1 capsule (100 mg total) by mouth 2 (two) times daily. Patient taking differently: 100 mg See admin instructions. 100 mg, per tube, two times a day 11/02/20   Johnson, Clanford L, MD  Fluticasone-Salmeterol (ADVAIR)  250-50 MCG/DOSE AEPB Inhale 1 puff into the lungs in the morning and at bedtime.    [provider]  folic acid (FOLVITE) 1 MG tablet Place 1 tablet (1 mg total) into feeding tube daily. 04/23/20   Raiford Noble Latif, DO  insulin aspart (NOVOLOG) 100 UNIT/ML injection Inject 0-9 Units into the skin 3 (three) times daily with meals. Sliding scale insulin Less than 70 initiate hypoglycemia protocol 70-120  0 units 120-150 1 unit 151-200 2 units 201-250 3 units 251-300 5 units 301-350 7 units 351-400 9 units  Greater than 400 call MD 08/16/22   Oswald Hillock, MD  levETIRAcetam (KEPPRA) 100 MG/ML solution Place 5 mLs (500 mg total) into feeding tube 2 (two) times daily. 04/23/20   Raiford Noble Latif, DO  loratadine (CLARITIN) 10 MG tablet Place 1 tablet (10 mg total) into feeding tube daily. Patient not taking: Reported on 08/13/2022 04/23/20   Raiford Noble Latif, DO  losartan (COZAAR) 100 MG tablet Place 1 tablet (100 mg total) into feeding tube daily. 04/23/20   Raiford Noble Latif, DO  magnesium 30 MG tablet Place 1 tablet (30 mg total) into feeding tube daily. 04/23/20   Sheikh, Omair Latif, DO  melatonin 5 MG TABS Place 5 mg into feeding tube at bedtime.    [provider]  metFORMIN (GLUCOPHAGE) 1000 MG tablet Place 1 tablet (1,000 mg total) into feeding tube 2 (two) times daily with a meal. 04/23/20   Sheikh, Omair [place], DO  MIRALAX 17 GM/SCOOP  powder Place 17 g into feeding tube daily as needed for moderate constipation. 08/16/22   Oswald Hillock, MD  montelukast (SINGULAIR) 10 MG tablet Place 1 tablet (10 mg total) into feeding tube at bedtime. 04/23/20   Raiford Noble Latif, DO  Multiple Vitamin (MULTIVITAMIN WITH MINERALS) TABS tablet Place 1 tablet into feeding tube daily. 04/23/20   Raiford Noble Latif, DO  Nutritional Supplements (FEEDING SUPPLEMENT, JEVITY 1.2 CAL,) LIQD 237 mLs See admin instructions. 237 ml's every four hours and flush with 30 ml's of water before and  after each administration    [provider]  Nutritional Supplements (FEEDING SUPPLEMENT, OSMOLITE 1.5 CAL,) LIQD Place 1,000 mLs into feeding tube continuous. Patient not taking: Reported on 08/13/2022 11/02/20   Murlean Iba, MD  Nutritional Supplements (FEEDING SUPPLEMENT, PROSOURCE TF,) liquid Place 45 mLs into feeding tube 3 (three) times daily. Patient not taking: Reported on 08/13/2022 11/02/20   Murlean Iba, MD  Quercetin 250 MG TABS Give 250 mg by tube in the morning and at bedtime.    [provider]  thiamine 100 MG tablet Place 1 tablet (100 mg total) into feeding tube daily. 04/23/20   Raiford Noble Latif, DO  traMADol (ULTRAM) 50 MG tablet Place 1 tablet (50 mg total) into feeding tube every 6 (six) hours as needed (for pain). 08/16/22   Oswald Hillock, MD  Water For Irrigation, Sterile (FREE WATER) SOLN Place 200 mLs into feeding tube every 8 (eight) hours. 04/23/20   Raiford Noble Latif, DO      Allergies    Chlorhexidine    Review of Systems   Review of Systems  Unable to perform ROS: Patient nonverbal    Physical Exam Updated Vital Signs BP (!) 128/93   Pulse 62   Temp (!) 97.4 F (36.3 C)   Resp 16   Ht 5\' 7"  (1.702 m)   Wt 58.3 kg   SpO2 100%   BMI 20.13 kg/m  Physical Exam Vitals and nursing note reviewed.  Constitutional:      General: She is not in acute distress.    Appearance: She is not ill-appearing.  HENT:     Head: Normocephalic.  Eyes:     Pupils: Pupils are equal, round, and reactive to light.  Cardiovascular:     Rate and Rhythm: Normal rate and regular rhythm.     Pulses: Normal pulses.     Heart sounds: Normal heart sounds. No murmur heard.    No friction rub. No gallop.  Pulmonary:     Effort: Pulmonary effort is normal.     Breath sounds: Normal breath sounds.  Abdominal:     General: Abdomen is flat. There is no distension.     Palpations: Abdomen is soft.     Tenderness: There is no abdominal  tenderness. There is no guarding or rebound.     Comments: PEG tube insertion site in center of abdomen. Nontender abdomen. No erythema surrounding insertion site. No drainage.   Musculoskeletal:        General: Normal range of motion.     Cervical back: Neck supple.  Skin:    General: Skin is warm and dry.  Neurological:     General: No focal deficit present.     Mental Status: She is alert.  Psychiatric:        Mood and Affect: Mood normal.        Behavior: Behavior normal.     ED Results / Procedures /  Treatments   Labs (all labs ordered are listed, but only abnormal results are displayed) Labs Reviewed - No data to display  EKG None  Radiology DG ABDOMEN PEG TUBE LOCATION  Result Date: 09/17/2022 CLINICAL DATA:  Status post PEG tube placement EXAM: ABDOMEN - 1 VIEW COMPARISON:  Abdominal x-ray 05/17/2022 FINDINGS: Peg tube is seen within the distal stomach. Contrast is seen within nondilated stomach, duodenal mid jejunum. No dilated bowel loops are seen. No extravasation of contrast. Right hip screw is partially visualized. IMPRESSION: PEG tube within the distal stomach. No extravasation of contrast. Electronically Signed   By: Darliss Cheney M.D.   On: 09/17/2022 19:46    Procedures Gastrostomy tube replacement  Date/Time: 09/17/2022 7:09 PM  Performed by: Mannie Stabile, PA-C Authorized by: Mannie Stabile, PA-C  Consent: Verbal consent obtained. Consent given by: patient Patient understanding: patient states understanding of the procedure being performed Patient consent: the patient's understanding of the procedure matches consent given Procedure consent: procedure consent matches procedure scheduled Relevant documents: relevant documents present and verified Test results: test results available and properly labeled Site marked: the operative site was marked Imaging studies: imaging studies available Patient identity confirmed: arm band Time out: Immediately  prior to procedure a "time out" was called to verify the correct patient, procedure, equipment, support staff and site/side marked as required. Preparation: Patient was prepped and draped in the usual sterile fashion. Local anesthesia used: yes Anesthesia: local infiltration  Anesthesia: Local anesthesia used: yes Local Anesthetic: lidocaine/prilocaine emulsion Anesthetic total: 5 mL  Sedation: Patient sedated: no  Patient tolerance: patient tolerated the procedure well with no immediate complications Comments: Procedure required dilation with 3F and 8F foley catheter. 8F PEG tube placed       Medications Ordered in ED Medications  lidocaine (XYLOCAINE) 2 % jelly 1 Application (has no administration in time range)    ED Course/ Medical Decision Making/ A&P                             Medical Decision Making Amount and/or Complexity of Data Reviewed Independent Historian: EMS External Data Reviewed: notes.    Details: Previous admission note Radiology: ordered and independent interpretation performed. Decision-making details documented in ED Course.   62 year old female presents to the ED due to PEG tube complication.  PEG tube dislodged earlier today per EMS.  Patient is nonverbal and unable to provide history.  Unable to reach living facility after numerous attempts.  Upon arrival, stable vitals.  Patient in no acute distress.  PEG tube insertion site in center of abdomen with no evidence of infection.  No purulent drainage. Per chart review, patient had PEG tube placed on 12/18 by IR after unsuccessful attempt at replacement in ED. At that time, patient had 69F tube placed. PEG tube replaced.  See procedure note above. Unfortunately, no 69F here in the ED, 8F placed with good positioning. X-ray personally reviewed and interpreted which confirms placement. Patient had no complications during procedure. Patient stable for discharge. Strict ED precautions discussed with  patient. Patient states understanding and agrees to plan. Patient discharged home in no acute distress and stable vitals  Discussed with Dr. Silverio Lay who evaluated patient at bedside and assisted with PEG tube replacement and agrees with assessment and plan.   Has PCP Hx schizoaffective disorder, cognitive communication deficit, DM       Final Clinical Impression(s) / ED Diagnoses Final diagnoses:  Dislodged gastrostomy  tube    Rx / DC Orders ED Discharge Orders     None         Jesusita Oka 09/17/22 1958    Charlynne Pander, MD 09/17/22 [phone]

## 2022-09-17 NOTE — Discharge Instructions (Addendum)
It was a pleasure taking care of you today. Your feeding tube was replaced today in the ER. Follow-up with PCP within 1 week. Return to the ER for new or worsening symptoms.

## 2022-09-17 NOTE — ED Notes (Signed)
Ptar called 

## 2022-09-17 NOTE — ED Triage Notes (Signed)
Pt here from [facility] home for displaced g tube , hx of same

## 2022-11-13 ENCOUNTER — Emergency Department (HOSPITAL_COMMUNITY)
Admission: EM | Admit: 2022-11-13 | Discharge: 2022-11-13 | Disposition: A | Discharge status: Home / Self Care | Payer: Medicaid Other | Attending: Student | Admitting: Student

## 2022-11-13 ENCOUNTER — Emergency Department (HOSPITAL_COMMUNITY): Payer: Medicaid Other

## 2022-11-13 DIAGNOSIS — Z7984 Long term (current) use of oral hypoglycemic drugs: Secondary | ICD-10-CM | POA: Insufficient documentation

## 2022-11-13 DIAGNOSIS — Z7982 Long term (current) use of aspirin: Secondary | ICD-10-CM | POA: Insufficient documentation

## 2022-11-13 DIAGNOSIS — K9423 Gastrostomy malfunction: Secondary | ICD-10-CM | POA: Diagnosis present

## 2022-11-13 DIAGNOSIS — E119 Type 2 diabetes mellitus without complications: Secondary | ICD-10-CM | POA: Insufficient documentation

## 2022-11-13 DIAGNOSIS — Z794 Long term (current) use of insulin: Secondary | ICD-10-CM | POA: Insufficient documentation

## 2022-11-13 DIAGNOSIS — Z431 Encounter for attention to gastrostomy: Secondary | ICD-10-CM

## 2022-11-13 DIAGNOSIS — J45909 Unspecified asthma, uncomplicated: Secondary | ICD-10-CM | POA: Insufficient documentation

## 2022-11-13 MED ORDER — DIATRIZOATE MEGLUMINE & SODIUM 66-10 % PO SOLN
ORAL | Status: AC
  Administered 2022-11-13: 30 mL
  Filled 2022-11-13: qty 30

## 2022-11-13 NOTE — ED Triage Notes (Signed)
Pt arrived from [place]. Staff called out do to her peg tube becoming dislodged.  Pt denies pain at this time  Pt is alert and oriented to her baseline.

## 2022-11-13 NOTE — Discharge Instructions (Addendum)
Your PEG tube was replaced today at bedside, 14 [place] was placed and confirmed to be in correct location on imaging.  Should this occur again please return to the emergency department immediately.  If you should be unable to use the PEG tube please return.  Or if you have intractable nausea or vomiting or severe abdominal pain.

## 2022-11-13 NOTE — ED Notes (Signed)
Shift report received, assumed care of patient at this time.  

## 2022-11-13 NOTE — ED Notes (Signed)
[facility] with no answer.

## 2022-11-13 NOTE — ED Provider Notes (Signed)
[facility] Provider Note   CSN: [id number] Arrival date & time: 11/13/22  2046     History {Add pertinent medical, surgical, social history, OB history to HPI:1} No chief complaint on file.   Tamara [name] is a 62 y.o. female pmh hyperlipidemia, schizoaffective disorder, cognitive communication deficit, asthma, and diabetes presenting from [place] living facility for PEG tube dislodgment.  Unable to contact facility so unsure how long ago PEG tube was dislodged.  Patient denies abdominal pain at this time.  HPI     Home Medications Prior to Admission medications   Medication Sig Start Date End Date Taking? Authorizing Provider  Acetaminophen 500 MG capsule Place 2 capsules (1,000 mg total) into feeding tube 3 (three) times daily as needed for pain. 04/23/20   Raiford Noble Latif, DO  albuterol (PROVENTIL HFA;VENTOLIN HFA) 108 (90 BASE) MCG/ACT inhaler Inhale 2 puffs into the lungs every 6 (six) hours as needed for wheezing. 10/04/12   Johnson, Clanford L, MD  aspirin (ASPIRIN CHILDRENS) 81 MG chewable tablet Place 1 tablet (81 mg total) into feeding tube daily. 04/23/20   Raiford Noble Latif, DO  atorvastatin (LIPITOR) 40 MG tablet Place 40 mg into feeding tube at bedtime.    [provider]  buPROPion ER (WELLBUTRIN SR) 100 MG 12 hr tablet 100 mg See admin instructions. 100 mg, per tube, once a day    [provider]  Cholecalciferol (VITAMIN D3) 50 MCG (2000 UT) TABS Place 2,000 Units into feeding tube daily.    [provider]  docusate sodium (COLACE) 100 MG capsule Take 1 capsule (100 mg total) by mouth 2 (two) times daily. Patient taking differently: 100 mg See admin instructions. 100 mg, per tube, two times a day 11/02/20   Johnson, Clanford L, MD  Fluticasone-Salmeterol (ADVAIR) 250-50 MCG/DOSE AEPB Inhale 1 puff into the lungs in the morning and at bedtime.    [provider]  folic acid  (FOLVITE) 1 MG tablet Place 1 tablet (1 mg total) into feeding tube daily. 04/23/20   Raiford Noble Latif, DO  insulin aspart (NOVOLOG) 100 UNIT/ML injection Inject 0-9 Units into the skin 3 (three) times daily with meals. Sliding scale insulin Less than 70 initiate hypoglycemia protocol 70-120  0 units 120-150 1 unit 151-200 2 units 201-250 3 units 251-300 5 units 301-350 7 units 351-400 9 units  Greater than 400 call MD 08/16/22   Oswald Hillock, MD  levETIRAcetam (KEPPRA) 100 MG/ML solution Place 5 mLs (500 mg total) into feeding tube 2 (two) times daily. 04/23/20   Raiford Noble Latif, DO  loratadine (CLARITIN) 10 MG tablet Place 1 tablet (10 mg total) into feeding tube daily. Patient not taking: Reported on 08/13/2022 04/23/20   Raiford Noble Latif, DO  losartan (COZAAR) 100 MG tablet Place 1 tablet (100 mg total) into feeding tube daily. 04/23/20   Raiford Noble Latif, DO  magnesium 30 MG tablet Place 1 tablet (30 mg total) into feeding tube daily. 04/23/20   Sheikh, Omair Latif, DO  melatonin 5 MG TABS Place 5 mg into feeding tube at bedtime.    [provider]  metFORMIN (GLUCOPHAGE) 1000 MG tablet Place 1 tablet (1,000 mg total) into feeding tube 2 (two) times daily with a meal. 04/23/20   Sheikh, Omair Latif, DO  MIRALAX 17 GM/SCOOP powder Place 17 g into feeding tube daily as needed for moderate constipation. 08/16/22   Oswald Hillock, MD  montelukast (SINGULAIR) 10  MG tablet Place 1 tablet (10 mg total) into feeding tube at bedtime. 04/23/20   Raiford Noble Latif, DO  Multiple Vitamin (MULTIVITAMIN WITH MINERALS) TABS tablet Place 1 tablet into feeding tube daily. 04/23/20   Raiford Noble Latif, DO  Nutritional Supplements (FEEDING SUPPLEMENT, JEVITY 1.2 CAL,) LIQD 237 mLs See admin instructions. 237 ml's every four hours and flush with 30 ml's of water before and after each administration    [provider]  Nutritional Supplements (FEEDING SUPPLEMENT, OSMOLITE 1.5 CAL,)  LIQD Place 1,000 mLs into feeding tube continuous. Patient not taking: Reported on 08/13/2022 11/02/20   Murlean Iba, MD  Nutritional Supplements (FEEDING SUPPLEMENT, PROSOURCE TF,) liquid Place 45 mLs into feeding tube 3 (three) times daily. Patient not taking: Reported on 08/13/2022 11/02/20   Murlean Iba, MD  Quercetin 250 MG TABS Give 250 mg by tube in the morning and at bedtime.    [provider]  thiamine 100 MG tablet Place 1 tablet (100 mg total) into feeding tube daily. 04/23/20   Raiford Noble Latif, DO  traMADol (ULTRAM) 50 MG tablet Place 1 tablet (50 mg total) into feeding tube every 6 (six) hours as needed (for pain). 08/16/22   Oswald Hillock, MD  Water For Irrigation, Sterile (FREE WATER) SOLN Place 200 mLs into feeding tube every 8 (eight) hours. 04/23/20   Raiford Noble Latif, DO      Allergies    Chlorhexidine    Review of Systems   See hpi  Physical Exam Updated Vital Signs SpO2 98%  Physical Exam Vitals and nursing note reviewed.  Constitutional:      General: She is not in acute distress.    Appearance: She is well-developed.  HENT:     Head: Normocephalic and atraumatic.     Nose: Nose normal.     Mouth/Throat:     Mouth: Mucous membranes are moist.     Pharynx: Oropharynx is clear.  Eyes:     Conjunctiva/sclera: Conjunctivae normal.  Cardiovascular:     Rate and Rhythm: Normal rate and regular rhythm.     Heart sounds: No murmur heard. Pulmonary:     Effort: Pulmonary effort is normal. No respiratory distress.     Breath sounds: Normal breath sounds.  Abdominal:     General: Abdomen is flat.     Palpations: Abdomen is soft.     Tenderness: There is no abdominal tenderness.     Comments: PEG tube hole is patent, no abnormal drainage or tenderness in the area  Musculoskeletal:        General: No swelling.     Cervical back: Neck supple.  Skin:    General: Skin is warm and dry.     Capillary Refill: Capillary refill takes less  than 2 seconds.  Neurological:     Mental Status: She is alert.  Psychiatric:        Mood and Affect: Mood normal.     ED Results / Procedures / Treatments   Labs (all labs ordered are listed, but only abnormal results are displayed) Labs Reviewed - No data to display  EKG None  Radiology No results found.  Procedures Procedures  {Document cardiac monitor, telemetry assessment procedure when appropriate:1}  Medications Ordered in ED Medications - No data to display  ED Course/ Medical Decision Making/ A&P   {   Click here for ABCD2, HEART and other calculatorsREFRESH Note before signing :1}  Medical Decision Making Amount and/or Complexity of Data Reviewed Radiology: ordered.   ***  {Document critical care time when appropriate:1} {Document review of labs and clinical decision tools ie heart score, Chads2Vasc2 etc:1}  {Document your independent review of radiology images, and any outside records:1} {Document your discussion with family members, caretakers, and with consultants:1} {Document social determinants of health affecting pt's care:1} {Document your decision making why or why not admission, treatments were needed:1} Final Clinical Impression(s) / ED Diagnoses Final diagnoses:  None    Rx / DC Orders ED Discharge Orders     None

## 2022-11-13 NOTE — ED Notes (Signed)
Ptar called 

## 2022-11-13 NOTE — ED Notes (Signed)
Pt transported to Xray. 

## 2022-11-14 NOTE — ED Provider Notes (Signed)
Gastrostomy tube replacement  Date/Time: 11/14/2022 1:54 PM  Performed by: Teressa Lower, MD Authorized by: Teressa Lower, MD  Consent: Verbal consent obtained. Risks and benefits: risks, benefits and alternatives were discussed Consent given by: patient Patient identity confirmed: verbally with patient Time out: Immediately prior to procedure a "time out" was called to verify the correct patient, procedure, equipment, support staff and site/side marked as required. Preparation: Patient was prepped and draped in the usual sterile fashion. Local anesthesia used: no  Anesthesia: Local anesthesia used: no  Sedation: Patient sedated: no  Patient tolerance: patient tolerated the procedure well with no immediate complications       Teressa Lower, MD 11/14/22 1354

## 2023-01-02 ENCOUNTER — Other Ambulatory Visit (HOSPITAL_COMMUNITY): Payer: Self-pay | Admitting: Nurse Practitioner

## 2023-01-02 DIAGNOSIS — R633 Feeding difficulties, unspecified: Secondary | ICD-10-CM

## 2023-01-04 ENCOUNTER — Ambulatory Visit (HOSPITAL_COMMUNITY)
Admission: RE | Admit: 2023-01-04 | Discharge: 2023-01-04 | Disposition: A | Discharge status: Home / Self Care | Payer: Medicaid Other | Source: Ambulatory Visit | Attending: Nurse Practitioner | Admitting: Nurse Practitioner

## 2023-01-04 DIAGNOSIS — Z431 Encounter for attention to gastrostomy: Secondary | ICD-10-CM | POA: Insufficient documentation

## 2023-01-04 DIAGNOSIS — R633 Feeding difficulties, unspecified: Secondary | ICD-10-CM | POA: Diagnosis not present

## 2023-01-04 HISTORY — PX: IR REPLC GASTRO/COLONIC TUBE PERCUT W/FLUORO: IMG2333

## 2023-01-04 MED ORDER — LIDOCAINE VISCOUS HCL 2 % MT SOLN
OROMUCOSAL | Status: AC
  Filled 2023-01-04: qty 15

## 2023-01-04 MED ORDER — IOHEXOL 300 MG/ML  SOLN: 50.0000 mL | Freq: Once | INTRAMUSCULAR | Status: DC | PRN

## 2023-01-04 NOTE — Procedures (Signed)
Interventional Radiology Procedure Note  Procedure: fluoro Gtube upsize and exchg    Complications: None  Estimated Blood Loss:  0  Findings: 18 fr balloon retention Gtube confirmed in the stomach Ready for use     Sharen Counter, MD

## 2023-01-09 ENCOUNTER — Emergency Department (HOSPITAL_COMMUNITY): Payer: Medicaid Other

## 2023-01-09 ENCOUNTER — Emergency Department (HOSPITAL_COMMUNITY)
Admission: EM | Admit: 2023-01-09 | Discharge: 2023-01-09 | Disposition: A | Discharge status: Home / Self Care | Payer: Medicaid Other | Attending: Emergency Medicine | Admitting: Emergency Medicine

## 2023-01-09 ENCOUNTER — Other Ambulatory Visit: Payer: Self-pay

## 2023-01-09 DIAGNOSIS — J45909 Unspecified asthma, uncomplicated: Secondary | ICD-10-CM | POA: Insufficient documentation

## 2023-01-09 DIAGNOSIS — Z79899 Other long term (current) drug therapy: Secondary | ICD-10-CM | POA: Diagnosis not present

## 2023-01-09 DIAGNOSIS — Z794 Long term (current) use of insulin: Secondary | ICD-10-CM | POA: Diagnosis not present

## 2023-01-09 DIAGNOSIS — Z7982 Long term (current) use of aspirin: Secondary | ICD-10-CM | POA: Insufficient documentation

## 2023-01-09 DIAGNOSIS — E1122 Type 2 diabetes mellitus with diabetic chronic kidney disease: Secondary | ICD-10-CM | POA: Diagnosis not present

## 2023-01-09 DIAGNOSIS — Z8616 Personal history of COVID-19: Secondary | ICD-10-CM | POA: Diagnosis not present

## 2023-01-09 DIAGNOSIS — N189 Chronic kidney disease, unspecified: Secondary | ICD-10-CM | POA: Insufficient documentation

## 2023-01-09 DIAGNOSIS — S0083XA Contusion of other part of head, initial encounter: Secondary | ICD-10-CM | POA: Insufficient documentation

## 2023-01-09 DIAGNOSIS — I129 Hypertensive chronic kidney disease with stage 1 through stage 4 chronic kidney disease, or unspecified chronic kidney disease: Secondary | ICD-10-CM | POA: Diagnosis not present

## 2023-01-09 DIAGNOSIS — W19XXXA Unspecified fall, initial encounter: Secondary | ICD-10-CM | POA: Insufficient documentation

## 2023-01-09 DIAGNOSIS — Z7951 Long term (current) use of inhaled steroids: Secondary | ICD-10-CM | POA: Diagnosis not present

## 2023-01-09 DIAGNOSIS — S0993XA Unspecified injury of face, initial encounter: Secondary | ICD-10-CM | POA: Diagnosis present

## 2023-01-09 LAB — CBG MONITORING, ED: Glucose-Capillary: 93 mg/dL (ref 70–99)

## 2023-01-09 NOTE — ED Provider Notes (Signed)
I spoke to the nurse at patient's facility who advised that patient was found on the ground.  Patient had a hematoma on her forehead.  Patient was acting normally.  The fall was seen by a visitor.  She did not lose consciousness. CT shows a contusion to scalp. On reevaluation patient is awake alert and pleasant she reports she feels well.   Elson Areas, PA-C 01/09/23 1550    Loetta Rough, MD 01/23/23 [phone]

## 2023-01-09 NOTE — ED Provider Notes (Signed)
[place] EMERGENCY DEPARTMENT AT [facility] Provider Note   CSN: [id number] Arrival date & time: 01/09/23  1114     History  Chief Complaint  Patient presents with   Fall    Tamara [name] is a 62 y.o. female with schizoaffective disorder nonverbal at baseline, asthma, CKD, T2DM, hyperammonemia, PEG tube who presents with fall.   Pt BIB by EMS for unwitnessed mechanical fall at [place] [place], pt hit head, unsure of LOC. Hx of dysphagia w/ PEG tube, incontinence. No blood thinners, just daily aspirin. Nothing else medically is going on with her per facility. Per chart review patient is nonverbal at baseline. She cannot provide any history today except to say that she is not in pain. Per chart review she often comes for gastrostomy tube replacements.   Fall       Home Medications Prior to Admission medications   Medication Sig Start Date End Date Taking? Authorizing Provider  Acetaminophen 500 MG capsule Place 2 capsules (1,000 mg total) into feeding tube 3 (three) times daily as needed for pain. 04/23/20  Yes Sheikh, Omair Latif, DO  albuterol (PROVENTIL HFA;VENTOLIN HFA) 108 (90 BASE) MCG/ACT inhaler Inhale 2 puffs into the lungs every 6 (six) hours as needed for wheezing. 10/04/12  Yes Johnson, Clanford L, MD  aspirin (ASPIRIN CHILDRENS) 81 MG chewable tablet Place 1 tablet (81 mg total) into feeding tube daily. 04/23/20  Yes Sheikh, Omair Latif, DO  atorvastatin (LIPITOR) 40 MG tablet Place 40 mg into feeding tube at bedtime.   Yes [provider]  buPROPion (WELLBUTRIN) 75 MG tablet Take 75 mg by mouth See admin instructions. Take 2 tablets (150 mg) via NJ tube at bedtime   Yes [provider]  Cholecalciferol (VITAMIN D3) 50 MCG (2000 UT) TABS Place 2,000 Units into feeding tube daily.   Yes [provider]  docusate sodium (COLACE) 100 MG capsule Take 1 capsule (100 mg total) by mouth 2 (two) times daily. Patient taking differently: 100 mg  See admin instructions. 100 mg, per tube, two times a day 11/02/20  Yes Johnson, Clanford L, MD  Fluticasone-Salmeterol (ADVAIR) 250-50 MCG/DOSE AEPB Inhale 1 puff into the lungs in the morning and at bedtime.   Yes [provider]  folic acid (FOLVITE) 1 MG tablet Place 1 tablet (1 mg total) into feeding tube daily. 04/23/20  Yes Sheikh, Omair Latif, DO  insulin aspart (NOVOLOG) 100 UNIT/ML injection Inject 0-9 Units into the skin 3 (three) times daily with meals. Sliding scale insulin Less than 70 initiate hypoglycemia protocol 70-120  0 units 120-150 1 unit 151-200 2 units 201-250 3 units 251-300 5 units 301-350 7 units 351-400 9 units  Greater than 400 call MD 08/16/22  Yes Meredeth Ide, MD  levETIRAcetam (KEPPRA) 100 MG/ML solution Place 5 mLs (500 mg total) into feeding tube 2 (two) times daily. 04/23/20  Yes Sheikh, Omair Latif, DO  losartan (COZAAR) 100 MG tablet Place 1 tablet (100 mg total) into feeding tube daily. 04/23/20  Yes Sheikh, Omair Latif, DO  magnesium 30 MG tablet Place 1 tablet (30 mg total) into feeding tube daily. 04/23/20  Yes Sheikh, Omair Latif, DO  melatonin 5 MG TABS Place 5 mg into feeding tube at bedtime.   Yes [provider]  metFORMIN (GLUCOPHAGE) 1000 MG tablet Place 1 tablet (1,000 mg total) into feeding tube 2 (two) times daily with a meal. 04/23/20  Yes Sheikh, Omair [place], DO  MIRALAX 17 GM/SCOOP powder Place 17  g into feeding tube daily as needed for moderate constipation. 08/16/22  Yes Lama, Sarina Ill, MD  montelukast (SINGULAIR) 10 MG tablet Place 1 tablet (10 mg total) into feeding tube at bedtime. 04/23/20  Yes Sheikh, Omair Latif, DO  Multiple Vitamin (MULTIVITAMIN WITH MINERALS) TABS tablet Place 1 tablet into feeding tube daily. 04/23/20  Yes Sheikh, Omair Latif, DO  thiamine 100 MG tablet Place 1 tablet (100 mg total) into feeding tube daily. 04/23/20  Yes Sheikh, Omair Latif, DO  traMADol (ULTRAM) 50 MG tablet Place 1 tablet (50 mg total)  into feeding tube every 6 (six) hours as needed (for pain). 08/16/22  Yes Meredeth Ide, MD  Water For Irrigation, Sterile (FREE WATER) SOLN Place 200 mLs into feeding tube every 8 (eight) hours. 04/23/20  Yes Sheikh, Omair Latif, DO  buPROPion ER [facility] SR) 100 MG 12 hr tablet 100 mg See admin instructions. 100 mg, per tube, once a day    [provider]  Nutritional Supplements (FEEDING SUPPLEMENT, JEVITY 1.2 CAL,) LIQD 237 mLs See admin instructions. 237 ml's every four hours and flush with 30 ml's of water before and after each administration    [provider]  Quercetin 250 MG TABS Give 250 mg by tube in the morning and at bedtime.    [provider]      Allergies    Chlorhexidine    Review of Systems   Review of Systems  Unable to perform ROS: Patient nonverbal    Physical Exam Updated Vital Signs BP (!) 140/91   Pulse 94   Temp 98.7 F (37.1 C) (Oral)   Resp 14   Ht 5\' 7"  (1.702 m)   Wt 58.3 kg   SpO2 100%   BMI 20.13 kg/m  Physical Exam General: Normal appearing female, lying in bed.  HEENT: PERRLA, Sclera anicteric, MMM, trachea midline. Tongue protrudes midline. Small hematoma on L forehead. No skull depressions/deformities. No scalp lacerations. Stable forehead, midface, nasal bridge.  Cardiology: RRR, no murmurs/rubs/gallops. BL radial and DP pulses equal bilaterally.  Resp: Normal respiratory rate and effort. CTAB, no wheezes, rhonchi, crackles.  Abd: G tube in place without surrounding erythema/induration/fluctuance. Soft, non-tender, non-distended. No rebound tenderness or guarding.  GU: Deferred. MSK: No peripheral edema or signs of trauma. Extremities without deformity or TTP. No cyanosis or clubbing. Skin: warm, dry. No rashes or lesions. Back: No midline C spine TTP/stepoffs/deformities. Neuro: A&Ox1, CNs II-XII grossly intact. MAEs. Sensation grossly intact.  Psych: Pleasant mood and affect.   ED Results / Procedures /  Treatments   Labs (all labs ordered are listed, but only abnormal results are displayed) Labs Reviewed  CBG MONITORING, ED    EKG EKG Interpretation  Date/Time:  Tuesday Jan 09 2023 14:46:05 EDT Ventricular Rate:  73 PR Interval:  166 QRS Duration: 95 QT Interval:  443 QTC Calculation: 489 R Axis:   -50 Text Interpretation: Sinus rhythm LAD, consider left anterior fascicular block Borderline T wave abnormalities Borderline prolonged QT interval Since last tracing rate slower Otherwise no significant change Confirmed by Melene Plan [phone]) on 01/10/2023 6:58:49 AM  Radiology No results found.  Procedures Procedures    Medications Ordered in ED Medications - No data to display  ED Course/ Medical Decision Making/ A&P                          Medical Decision Making Amount and/or Complexity of Data Reviewed Radiology: ordered. Decision-making details  documented in ED Course.    This patient presents to the ED for concern of unwitnessed fall, this involves an extensive number of treatment options, and is a complaint that carries with it a high risk of complications and morbidity.  MDM:    DDX for trauma includes but is not limited to:  -Head Injury such as skull fx or ICH - small hematoma on left forehead, will obtain CTH -Chest Injury and Abdominal Injury - no chest or abd TTP, signs of trauma -Spinal Cord or Vertebral injury - no FNDs appreciated on exam, no back TTP -Fractures - no extremity pain/deformities/signs of trauma   Clinical Course as of 01/23/23 1500  Tue Jan 09, 2023  1435 CT Head Wo Contrast 1.  No evidence of an acute intracranial abnormality. 2. Anterior scalp/forehead hematoma. 3. Mild chronic small vessel ischemic changes within the cerebral white matter. 4. Generalized cerebral atrophy. 5. Small-volume fluid within the bilateral mastoid air cells.   [HN]    Clinical Course User Index [HN] Loetta Rough, MD    Imaging Studies  ordered: I ordered imaging studies including CTH I independently visualized and interpreted imaging. I agree with the radiologist interpretation  Additional history obtained from chart review, PA Sofia called SNF.    Reevaluation: After the interventions noted above, I reevaluated the patient and found that they have :improved  Social Determinants of Health: Lives at facility  Disposition:  PA Sofia called SNF and confirmed that patient was acting at her baseline before and after the incident. Patient is stable on reevaluation w/ neg CTH. States she feels well. DC w/ discharge instructions/return precautions. All questions answered to patient's satisfaction.    Co morbidities that complicate the patient evaluation  Past Medical History:  Diagnosis Date   Asthma    Cognitive communication deficit    COVID-19    Diabetes mellitus without complication (HCC)    Dysphagia, oropharyngeal phase    Hypertension    Major depressive disorder with single episode    Memory changes    Metabolic encephalopathy    Muscle weakness (generalized)    Other abnormalities of gait and mobility    Renal disorder    Schizoaffective disorder (HCC)    Seizures (HCC)    Unspecified sequelae of unspecified cerebrovascular disease    Unspecified severe protein-calorie malnutrition (HCC)    Unsteadiness on feet      Medicines No orders of the defined types were placed in this encounter.   I have reviewed the patients home medicines and have made adjustments as needed  Problem List / ED Course: Problem List Items Addressed This Visit   None Visit Diagnoses     Contusion of face, initial encounter    -  Primary                   This note was created using dictation software, which may contain spelling or grammatical errors.    Loetta Rough, MD 01/23/23 [phone]

## 2023-01-09 NOTE — Discharge Instructions (Signed)
Return if any problems.

## 2023-01-09 NOTE — ED Triage Notes (Addendum)
Pt BIB by EMS for unwitnessed mechanical fall at [place] [place], pt hit head, unsure of LOC. Hx of dysphagia, incontinence. No blood thinners.

## 2023-01-09 NOTE — ED Notes (Addendum)
Report called into [facility] LPN at [facility]

## 2023-02-01 ENCOUNTER — Other Ambulatory Visit: Payer: Self-pay

## 2023-02-01 ENCOUNTER — Emergency Department (HOSPITAL_COMMUNITY): Payer: Medicaid Other

## 2023-02-01 ENCOUNTER — Emergency Department (HOSPITAL_COMMUNITY)
Admission: EM | Admit: 2023-02-01 | Discharge: 2023-02-01 | Disposition: A | Discharge status: Home / Self Care | Payer: Medicaid Other | Attending: Emergency Medicine | Admitting: Emergency Medicine

## 2023-02-01 DIAGNOSIS — S0990XA Unspecified injury of head, initial encounter: Secondary | ICD-10-CM

## 2023-02-01 DIAGNOSIS — W19XXXA Unspecified fall, initial encounter: Secondary | ICD-10-CM | POA: Insufficient documentation

## 2023-02-01 DIAGNOSIS — S0181XA Laceration without foreign body of other part of head, initial encounter: Secondary | ICD-10-CM | POA: Diagnosis not present

## 2023-02-01 LAB — CBC WITH DIFFERENTIAL/PLATELET
Abs Immature Granulocytes: 0.03 10*3/uL (ref 0.00–0.07)
Basophils Absolute: 0 10*3/uL (ref 0.0–0.1)
Basophils Relative: 0 %
Eosinophils Absolute: 0.2 10*3/uL (ref 0.0–0.5)
Eosinophils Relative: 2 %
HCT: 41.3 % (ref 36.0–46.0)
Hemoglobin: 13.5 g/dL (ref 12.0–15.0)
Immature Granulocytes: 0 %
Lymphocytes Relative: 21 %
Lymphs Abs: 2.3 10*3/uL (ref 0.7–4.0)
MCH: 29 pg (ref 26.0–34.0)
MCHC: 32.7 g/dL (ref 30.0–36.0)
MCV: 88.6 fL (ref 80.0–100.0)
Monocytes Absolute: 0.6 10*3/uL (ref 0.1–1.0)
Monocytes Relative: 5 %
Neutro Abs: 7.8 10*3/uL — ABNORMAL HIGH (ref 1.7–7.7)
Neutrophils Relative %: 72 %
Platelets: 340 10*3/uL (ref 150–400)
RBC: 4.66 MIL/uL (ref 3.87–5.11)
RDW: 14 % (ref 11.5–15.5)
WBC: 10.9 10*3/uL — ABNORMAL HIGH (ref 4.0–10.5)
nRBC: 0 % (ref 0.0–0.2)

## 2023-02-01 LAB — BASIC METABOLIC PANEL
Anion gap: 13 (ref 5–15)
BUN: 24 mg/dL — ABNORMAL HIGH (ref 6–20)
CO2: 28 mmol/L (ref 22–32)
Calcium: 10.5 mg/dL — ABNORMAL HIGH (ref 8.9–10.3)
Chloride: 98 mmol/L (ref 98–111)
Creatinine, Ser: 0.83 mg/dL (ref 0.44–1.00)
GFR, Estimated: >60 mL/min (ref >60)
Glucose, Bld: 104 mg/dL — ABNORMAL HIGH (ref 70–99)
Potassium: 4.2 mmol/L (ref 3.5–5.1)
Sodium: 139 mmol/L (ref 135–145)

## 2023-02-01 LAB — CK: Total CK: 159 U/L (ref 38–234)

## 2023-02-01 LAB — CBG MONITORING, ED: Glucose-Capillary: 96 mg/dL (ref 70–99)

## 2023-02-01 MED ORDER — LIDOCAINE-EPINEPHRINE-TETRACAINE (LET) TOPICAL GEL
3.0000 mL | Freq: Once | TOPICAL | Status: AC
  Administered 2023-02-01: 3 mL via TOPICAL
  Filled 2023-02-01: qty 3

## 2023-02-01 NOTE — ED Notes (Signed)
EDP at bedside during triage 

## 2023-02-01 NOTE — ED Notes (Signed)
Called report to [facility], Diplomatic Services operational officer notified of pt need transportation back to facility.

## 2023-02-01 NOTE — ED Triage Notes (Signed)
Pt arrived [facility] for a unwitnessed fall found this morning with small laceration to her forehead. Pt vomited 15 minutes prior to [facility] arrival.

## 2023-02-01 NOTE — Discharge Instructions (Signed)
You are seen in the emergency department for evaluation of injuries from a fall.  You had a laceration of your forehead that Dermabond was applied.  This will dissolve over time.  You had a CAT scan of your head and neck that did not show any obvious fractures.  Please follow-up with your regular doctor and return to the emergency department if any worsening or concerning symptoms.

## 2023-02-01 NOTE — ED Provider Notes (Signed)
[place] EMERGENCY DEPARTMENT AT [facility] Provider Note   CSN: [id number] Arrival date & time: 02/01/23  4098     History  Chief Complaint  Patient presents with   Fall    Tamara [name] is a 62 y.o. female.  Level 5 caveat secondary to cognitive issues .  Patient is a nursing home resident and was found on the floor after probable fall.  Patient unable to give any history, does not appear to be in any pain.  She has a laceration on her forehead.  Unclear how long she has been down on the floor.  Her last ED visit was a month ago for fall.  She has a history of encephalopathy with contractures right upper extremity.  The history is provided by the EMS personnel.  Fall This is a recurrent problem. The problem has not changed since onset.      Home Medications Prior to Admission medications   Medication Sig Start Date End Date Taking? Authorizing Provider  Acetaminophen 500 MG capsule Place 2 capsules (1,000 mg total) into feeding tube 3 (three) times daily as needed for pain. 04/23/20   Marguerita Merles Latif, DO  albuterol (PROVENTIL HFA;VENTOLIN HFA) 108 (90 BASE) MCG/ACT inhaler Inhale 2 puffs into the lungs every 6 (six) hours as needed for wheezing. 10/04/12   Johnson, Clanford L, MD  aspirin (ASPIRIN CHILDRENS) 81 MG chewable tablet Place 1 tablet (81 mg total) into feeding tube daily. 04/23/20   Marguerita Merles Latif, DO  atorvastatin (LIPITOR) 40 MG tablet Place 40 mg into feeding tube at bedtime.    [provider]  buPROPion (WELLBUTRIN) 75 MG tablet Take 75 mg by mouth See admin instructions. Take 2 tablets (150 mg) via NJ tube at bedtime    [provider]  buPROPion ER (WELLBUTRIN SR) 100 MG 12 hr tablet 100 mg See admin instructions. 100 mg, per tube, once a day    [provider]  Cholecalciferol (VITAMIN D3) 50 MCG (2000 UT) TABS Place 2,000 Units into feeding tube daily.    [provider]  docusate sodium (COLACE) 100 MG  capsule Take 1 capsule (100 mg total) by mouth 2 (two) times daily. Patient taking differently: 100 mg See admin instructions. 100 mg, per tube, two times a day 11/02/20   Johnson, Clanford L, MD  Fluticasone-Salmeterol (ADVAIR) 250-50 MCG/DOSE AEPB Inhale 1 puff into the lungs in the morning and at bedtime.    [provider]  folic acid (FOLVITE) 1 MG tablet Place 1 tablet (1 mg total) into feeding tube daily. 04/23/20   Marguerita Merles Latif, DO  insulin aspart (NOVOLOG) 100 UNIT/ML injection Inject 0-9 Units into the skin 3 (three) times daily with meals. Sliding scale insulin Less than 70 initiate hypoglycemia protocol 70-120  0 units 120-150 1 unit 151-200 2 units 201-250 3 units 251-300 5 units 301-350 7 units 351-400 9 units  Greater than 400 call MD 08/16/22   Meredeth Ide, MD  levETIRAcetam (KEPPRA) 100 MG/ML solution Place 5 mLs (500 mg total) into feeding tube 2 (two) times daily. 04/23/20   Marguerita Merles Latif, DO  losartan (COZAAR) 100 MG tablet Place 1 tablet (100 mg total) into feeding tube daily. 04/23/20   Marguerita Merles Latif, DO  magnesium 30 MG tablet Place 1 tablet (30 mg total) into feeding tube daily. 04/23/20   Sheikh, Omair Latif, DO  melatonin 5 MG TABS Place 5 mg into feeding tube at bedtime.    [provider]  metFORMIN (GLUCOPHAGE) 1000 MG tablet Place 1 tablet (1,000 mg total) into feeding tube 2 (two) times daily with a meal. 04/23/20   Sheikh, Omair Latif, DO  MIRALAX 17 GM/SCOOP powder Place 17 g into feeding tube daily as needed for moderate constipation. 08/16/22   Meredeth Ide, MD  montelukast (SINGULAIR) 10 MG tablet Place 1 tablet (10 mg total) into feeding tube at bedtime. 04/23/20   Marguerita Merles Latif, DO  Multiple Vitamin (MULTIVITAMIN WITH MINERALS) TABS tablet Place 1 tablet into feeding tube daily. 04/23/20   Marguerita Merles Latif, DO  Nutritional Supplements (FEEDING SUPPLEMENT, JEVITY 1.2 CAL,) LIQD 237 mLs See admin instructions. 237 ml's  every four hours and flush with 30 ml's of water before and after each administration    [provider]  Quercetin 250 MG TABS Give 250 mg by tube in the morning and at bedtime.    [provider]  thiamine 100 MG tablet Place 1 tablet (100 mg total) into feeding tube daily. 04/23/20   Marguerita Merles Latif, DO  traMADol (ULTRAM) 50 MG tablet Place 1 tablet (50 mg total) into feeding tube every 6 (six) hours as needed (for pain). 08/16/22   Meredeth Ide, MD  Water For Irrigation, Sterile (FREE WATER) SOLN Place 200 mLs into feeding tube every 8 (eight) hours. 04/23/20   Marguerita Merles Latif, DO      Allergies    Chlorhexidine    Review of Systems   Review of Systems  Unable to perform ROS: Patient nonverbal    Physical Exam Updated Vital Signs Temp 98.4 F (36.9 C)   Resp 16   Ht 5\' 7"  (1.702 m)   Wt 58.3 kg   BMI 20.13 kg/m  Physical Exam Vitals and nursing note reviewed.  Constitutional:      General: She is not in acute distress.    Appearance: Normal appearance. She is well-developed.  HENT:     Head: Normocephalic.     Comments: 1.5 cm laceration left upper forehead Eyes:     Conjunctiva/sclera: Conjunctivae normal.  Cardiovascular:     Rate and Rhythm: Normal rate and regular rhythm.     Heart sounds: No murmur heard. Pulmonary:     Effort: Pulmonary effort is normal. No respiratory distress.     Breath sounds: Normal breath sounds.  Abdominal:     Palpations: Abdomen is soft.     Tenderness: There is no abdominal tenderness. There is no guarding or rebound.     Comments: PEG Tube in place no surrounding erythema  Musculoskeletal:        General: No deformity.     Cervical back: Neck supple.     Comments: Right hand in orthotic  Skin:    General: Skin is warm and dry.     Capillary Refill: Capillary refill takes less than 2 seconds.  Neurological:     Mental Status: She is alert.     Comments: Patient is awake and will follow some basic  commands.  Minimal use of right hand.  She is barely able to lift her legs off the bed.  Minimally verbal.      ED Results / Procedures / Treatments   Labs (all labs ordered are listed, but only abnormal results are displayed) Labs Reviewed  BASIC METABOLIC PANEL - Abnormal; Notable for the following components:      Result Value   Glucose, Bld 104 (*)    BUN 24 (*)  Calcium 10.5 (*)    All other components within normal limits  CBC WITH DIFFERENTIAL/PLATELET - Abnormal; Notable for the following components:   WBC 10.9 (*)    Neutro Abs 7.8 (*)    All other components within normal limits  CK  CBG MONITORING, ED    EKG None  Radiology CT Head Wo Contrast  Result Date: 02/01/2023 CLINICAL DATA:  Fall with small laceration to forehead. EXAM: CT HEAD WITHOUT CONTRAST CT CERVICAL SPINE WITHOUT CONTRAST TECHNIQUE: Multidetector CT imaging of the head and cervical spine was performed following the standard protocol without intravenous contrast. Multiplanar CT image reconstructions of the cervical spine were also generated. RADIATION DOSE REDUCTION: This exam was performed according to the departmental dose-optimization program which includes automated exposure control, adjustment of the mA and/or kV according to patient size and/or use of iterative reconstruction technique. COMPARISON:  CT head 01/09/2023, CT cervical spine 01/30/2020 FINDINGS: CT HEAD FINDINGS Brain: There is no acute intracranial hemorrhage, extra-axial fluid collection, or acute infarct. Parenchymal volume is normal. The ventricles are normal in size. Gray-white differentiation is preserved. The pituitary and suprasellar region are normal. There is no mass lesion. There is no mass effect or midline shift. Vascular: No hyperdense vessel or unexpected calcification. Skull: Normal. Negative for fracture or focal lesion. Sinuses/Orbits: The imaged paranasal sinuses are clear. There is trace fluid in the mastoid air cells  bilaterally. The middle ear cavities are clear. The globes and orbits are unremarkable. Other: There is mild left forehead swelling with small locules of gas consistent with the provided history of laceration. CT CERVICAL SPINE FINDINGS Alignment: Normal. There is no jumped or perched facet or other evidence of traumatic malalignment. Skull base and vertebrae: Skull base alignment is maintained. Vertebral body heights are preserved. There is no evidence of acute fracture. There is no suspicious osseous lesion. Soft tissues and spinal canal: No prevertebral fluid or swelling. No visible canal hematoma. Disc levels: There is multilevel disc space narrowing and degenerative endplate change most advanced at C5-C6. There is overall mild multilevel facet arthropathy, most advanced on the left at C3-C4. There is mild spinal canal stenosis C4-C5. Upper chest: The imaged lung apices are clear. There is debris in the trachea. Other: None. IMPRESSION: 1. No acute intracranial pathology. 2. No acute fracture or traumatic malalignment of the cervical spine. 3. Known left forehead laceration. 4. Debris in the trachea. Correlate with any signs or symptoms of aspiration. Electronically Signed   By: Lesia Hausen M.D.   On: 02/01/2023 08:51   CT Cervical Spine Wo Contrast  Result Date: 02/01/2023 CLINICAL DATA:  Fall with small laceration to forehead. EXAM: CT HEAD WITHOUT CONTRAST CT CERVICAL SPINE WITHOUT CONTRAST TECHNIQUE: Multidetector CT imaging of the head and cervical spine was performed following the standard protocol without intravenous contrast. Multiplanar CT image reconstructions of the cervical spine were also generated. RADIATION DOSE REDUCTION: This exam was performed according to the departmental dose-optimization program which includes automated exposure control, adjustment of the mA and/or kV according to patient size and/or use of iterative reconstruction technique. COMPARISON:  CT head 01/09/2023, CT cervical  spine 01/30/2020 FINDINGS: CT HEAD FINDINGS Brain: There is no acute intracranial hemorrhage, extra-axial fluid collection, or acute infarct. Parenchymal volume is normal. The ventricles are normal in size. Gray-white differentiation is preserved. The pituitary and suprasellar region are normal. There is no mass lesion. There is no mass effect or midline shift. Vascular: No hyperdense vessel or unexpected calcification. Skull: Normal. Negative for  fracture or focal lesion. Sinuses/Orbits: The imaged paranasal sinuses are clear. There is trace fluid in the mastoid air cells bilaterally. The middle ear cavities are clear. The globes and orbits are unremarkable. Other: There is mild left forehead swelling with small locules of gas consistent with the provided history of laceration. CT CERVICAL SPINE FINDINGS Alignment: Normal. There is no jumped or perched facet or other evidence of traumatic malalignment. Skull base and vertebrae: Skull base alignment is maintained. Vertebral body heights are preserved. There is no evidence of acute fracture. There is no suspicious osseous lesion. Soft tissues and spinal canal: No prevertebral fluid or swelling. No visible canal hematoma. Disc levels: There is multilevel disc space narrowing and degenerative endplate change most advanced at C5-C6. There is overall mild multilevel facet arthropathy, most advanced on the left at C3-C4. There is mild spinal canal stenosis C4-C5. Upper chest: The imaged lung apices are clear. There is debris in the trachea. Other: None. IMPRESSION: 1. No acute intracranial pathology. 2. No acute fracture or traumatic malalignment of the cervical spine. 3. Known left forehead laceration. 4. Debris in the trachea. Correlate with any signs or symptoms of aspiration. Electronically Signed   By: Lesia Hausen M.D.   On: 02/01/2023 08:51    Procedures .Marland KitchenLaceration Repair  Date/Time: 02/01/2023 8:13 AM  Performed by: Terrilee Files, MD Authorized by:  Terrilee Files, MD   Consent:    Consent obtained:  Verbal   Consent given by:  Patient   Risks, benefits, and alternatives were discussed: yes     Risks discussed:  Infection, nerve damage, poor wound healing, pain, retained foreign body, tendon damage and vascular damage   Alternatives discussed:  No treatment, delayed treatment and referral Universal protocol:    Procedure explained and questions answered to patient or proxy's satisfaction: yes     Patient identity confirmed:  Verbally with patient Anesthesia:    Anesthesia method:  Topical application   Topical anesthetic:  LET Laceration details:    Location:  Face   Face location:  Forehead   Length (cm):  1.5 Treatment:    Area cleansed with:  Saline   Amount of cleaning:  Standard Skin repair:    Repair method:  Tissue adhesive Approximation:    Approximation:  Close Repair type:    Repair type:  Simple Post-procedure details:    Dressing:  Open (no dressing)   Procedure completion:  Tolerated well, no immediate complications     Medications Ordered in ED Medications  lidocaine-EPINEPHrine-tetracaine (LET) topical gel (has no administration in time range)    ED Course/ Medical Decision Making/ A&P Clinical Course as of 02/01/23 1723  Thu Feb 01, 2023  0908 Patient's head CT and cervical spine CT did not show any acute traumatic findings other than the known laceration.  They do comment upon some debris in the trachea, patient is breathing comfortably without any stridor no cough. [MB]    Clinical Course User Index [MB] Terrilee Files, MD                             Medical Decision Making Amount and/or Complexity of Data Reviewed Labs: ordered. Radiology: ordered.   This patient complains of fall forehead laceration; this involves an extensive number of treatment Options and is a complaint that carries with it a high risk of complications and morbidity. The differential includes fracture, bleed,  laceration, contusion, metabolic derangement  I  ordered, reviewed and interpreted labs, which included CBC with mildly elevated white count stable hemoglobin, chemistries unremarkable, CK normal  I ordered imaging studies which included CT head and cervical spine and I independently    visualized and interpreted imaging which showed no acute findings Additional history obtained from EMS Previous records obtained and reviewed in epic, multiple ED visits for falls and problems with her PEG tube Cardiac monitoring reviewed, normal sinus rhythm Social determinants considered, no significant barriers Critical Interventions: None  After the interventions stated above, I reevaluated the patient and found patient to be awake and in no distress Admission and further testing considered, no indications for admission or further workup at this time.  Will return back to her facility where they can continue to observe her.         Final Clinical Impression(s) / ED Diagnoses Final diagnoses:  Fall, initial encounter  Injury of head, initial encounter  Forehead laceration, initial encounter    Rx / DC Orders ED Discharge Orders     None         Terrilee Files, MD 02/01/23 1725

## 2023-05-17 ENCOUNTER — Encounter (HOSPITAL_COMMUNITY): Payer: Self-pay

## 2023-05-17 ENCOUNTER — Other Ambulatory Visit: Payer: Self-pay

## 2023-05-17 ENCOUNTER — Emergency Department (HOSPITAL_COMMUNITY): Payer: Medicaid Other

## 2023-05-17 ENCOUNTER — Emergency Department (HOSPITAL_COMMUNITY)
Admission: EM | Admit: 2023-05-17 | Discharge: 2023-05-17 | Disposition: A | Discharge status: Home / Self Care | Payer: Medicaid Other

## 2023-05-17 DIAGNOSIS — Z7982 Long term (current) use of aspirin: Secondary | ICD-10-CM | POA: Insufficient documentation

## 2023-05-17 DIAGNOSIS — E878 Other disorders of electrolyte and fluid balance, not elsewhere classified: Secondary | ICD-10-CM | POA: Diagnosis not present

## 2023-05-17 DIAGNOSIS — R112 Nausea with vomiting, unspecified: Secondary | ICD-10-CM | POA: Diagnosis not present

## 2023-05-17 DIAGNOSIS — Z79899 Other long term (current) drug therapy: Secondary | ICD-10-CM | POA: Diagnosis not present

## 2023-05-17 DIAGNOSIS — D72829 Elevated white blood cell count, unspecified: Secondary | ICD-10-CM | POA: Insufficient documentation

## 2023-05-17 DIAGNOSIS — R1031 Right lower quadrant pain: Secondary | ICD-10-CM | POA: Insufficient documentation

## 2023-05-17 DIAGNOSIS — Z794 Long term (current) use of insulin: Secondary | ICD-10-CM | POA: Insufficient documentation

## 2023-05-17 LAB — CBC WITH DIFFERENTIAL/PLATELET
Abs Immature Granulocytes: 0.04 10*3/uL (ref 0.00–0.07)
Basophils Absolute: 0 10*3/uL (ref 0.0–0.1)
Basophils Relative: 0 %
Eosinophils Absolute: 0.3 10*3/uL (ref 0.0–0.5)
Eosinophils Relative: 2 %
HCT: 37.5 % (ref 36.0–46.0)
Hemoglobin: 12.3 g/dL (ref 12.0–15.0)
Immature Granulocytes: 0 %
Lymphocytes Relative: 30 %
Lymphs Abs: 3.6 10*3/uL (ref 0.7–4.0)
MCH: 28 pg (ref 26.0–34.0)
MCHC: 32.8 g/dL (ref 30.0–36.0)
MCV: 85.4 fL (ref 80.0–100.0)
Monocytes Absolute: 0.7 10*3/uL (ref 0.1–1.0)
Monocytes Relative: 6 %
Neutro Abs: 7.2 10*3/uL (ref 1.7–7.7)
Neutrophils Relative %: 62 %
Platelets: 348 10*3/uL (ref 150–400)
RBC: 4.39 MIL/uL (ref 3.87–5.11)
RDW: 14.9 % (ref 11.5–15.5)
WBC: 11.8 10*3/uL — ABNORMAL HIGH (ref 4.0–10.5)
nRBC: 0 % (ref 0.0–0.2)

## 2023-05-17 LAB — COMPREHENSIVE METABOLIC PANEL
ALT: 21 U/L (ref 0–44)
AST: 23 U/L (ref 15–41)
Albumin: 3.7 g/dL (ref 3.5–5.0)
Alkaline Phosphatase: 121 U/L (ref 38–126)
Anion gap: 12 (ref 5–15)
BUN: 24 mg/dL — ABNORMAL HIGH (ref 6–20)
CO2: 27 mmol/L (ref 22–32)
Calcium: 9.8 mg/dL (ref 8.9–10.3)
Chloride: 97 mmol/L — ABNORMAL LOW (ref 98–111)
Creatinine, Ser: 0.74 mg/dL (ref 0.44–1.00)
GFR, Estimated: >60 mL/min (ref >60)
Glucose, Bld: 85 mg/dL (ref 70–99)
Potassium: 4 mmol/L (ref 3.5–5.1)
Sodium: 136 mmol/L (ref 135–145)
Total Bilirubin: 0.4 mg/dL (ref 0.3–1.2)
Total Protein: 7.7 g/dL (ref 6.5–8.1)

## 2023-05-17 LAB — TROPONIN I (HIGH SENSITIVITY)
Troponin I (High Sensitivity): 3 ng/L (ref <18)
Troponin I (High Sensitivity): 3 ng/L (ref <18)

## 2023-05-17 LAB — LIPASE, BLOOD: Lipase: 40 U/L (ref 11–51)

## 2023-05-17 LAB — CBG MONITORING, ED: Glucose-Capillary: 77 mg/dL (ref 70–99)

## 2023-05-17 MED ORDER — IOHEXOL 300 MG/ML  SOLN
100.0000 mL | Freq: Once | INTRAMUSCULAR | Status: AC | PRN
  Administered 2023-05-17: 100 mL via INTRAVENOUS

## 2023-05-17 MED ORDER — SODIUM CHLORIDE 0.9% FLUSH
3.0000 mL | Freq: Once | INTRAVENOUS | Status: AC
  Administered 2023-05-17: 3 mL via INTRAVENOUS

## 2023-05-17 MED ORDER — ONDANSETRON HCL 4 MG/2ML IJ SOLN
4.0000 mg | Freq: Once | INTRAMUSCULAR | Status: AC
  Administered 2023-05-17: 4 mg via INTRAVENOUS
  Filled 2023-05-17: qty 2

## 2023-05-17 NOTE — ED Notes (Signed)
Medical necessity complete, [facility] transport initiated back to [facility]

## 2023-05-17 NOTE — ED Notes (Signed)
Back from CT, no changes. Alert, NAD, calm, passively semi-interactive.

## 2023-05-17 NOTE — ED Notes (Signed)
Not in room, pt in CT

## 2023-05-17 NOTE — ED Provider Notes (Signed)
[place] [facility] DEPARTMENT AT [facility] Provider Note   CSN: [id number] Arrival date & time: 05/17/23  1155     History Chief Complaint  Patient presents with   Emesis   Nausea    Tamara [name] is a 62 y.o. female patient with history of schizoaffective disorder, seizures, stroke with right-sided hemiparesis and speech difficulty who presents to the emergency department today for further evaluation of right lower quadrant abdominal pain and nausea and vomiting.  Difficult to obtain a accurate history given the patient's chronic medical status.  She does complain of nausea and vomiting which did start yesterday.  Patient is complaining of right lower quadrant abdominal pain as well.   Emesis      Home Medications Prior to Admission medications   Medication Sig Start Date End Date Taking? Authorizing Provider  Acetaminophen 500 MG capsule Place 2 capsules (1,000 mg total) into feeding tube 3 (three) times daily as needed for pain. 04/23/20   Marguerita Merles Latif, DO  albuterol (PROVENTIL HFA;VENTOLIN HFA) 108 (90 BASE) MCG/ACT inhaler Inhale 2 puffs into the lungs every 6 (six) hours as needed for wheezing. 10/04/12   Johnson, Clanford L, MD  aspirin (ASPIRIN CHILDRENS) 81 MG chewable tablet Place 1 tablet (81 mg total) into feeding tube daily. 04/23/20   Marguerita Merles Latif, DO  atorvastatin (LIPITOR) 40 MG tablet Place 40 mg into feeding tube at bedtime.    [provider]  buPROPion (WELLBUTRIN) 75 MG tablet Take 75 mg by mouth See admin instructions. Take 2 tablets (150 mg) via NJ tube at bedtime    [provider]  buPROPion ER (WELLBUTRIN SR) 100 MG 12 hr tablet 100 mg See admin instructions. 100 mg, per tube, once a day    [provider]  Cholecalciferol (VITAMIN D3) 50 MCG (2000 UT) TABS Place 2,000 Units into feeding tube daily.    [provider]  docusate sodium (COLACE) 100 MG capsule Take 1 capsule (100 mg total) by mouth 2  (two) times daily. Patient taking differently: 100 mg See admin instructions. 100 mg, per tube, two times a day 11/02/20   Johnson, Clanford L, MD  Fluticasone-Salmeterol (ADVAIR) 250-50 MCG/DOSE AEPB Inhale 1 puff into the lungs in the morning and at bedtime.    [provider]  folic acid (FOLVITE) 1 MG tablet Place 1 tablet (1 mg total) into feeding tube daily. 04/23/20   Marguerita Merles Latif, DO  insulin aspart (NOVOLOG) 100 UNIT/ML injection Inject 0-9 Units into the skin 3 (three) times daily with meals. Sliding scale insulin Less than 70 initiate hypoglycemia protocol 70-120  0 units 120-150 1 unit 151-200 2 units 201-250 3 units 251-300 5 units 301-350 7 units 351-400 9 units  Greater than 400 call MD 08/16/22   Meredeth Ide, MD  levETIRAcetam (KEPPRA) 100 MG/ML solution Place 5 mLs (500 mg total) into feeding tube 2 (two) times daily. 04/23/20   Marguerita Merles Latif, DO  losartan (COZAAR) 100 MG tablet Place 1 tablet (100 mg total) into feeding tube daily. 04/23/20   Marguerita Merles Latif, DO  magnesium 30 MG tablet Place 1 tablet (30 mg total) into feeding tube daily. 04/23/20   Sheikh, Omair Latif, DO  melatonin 5 MG TABS Place 5 mg into feeding tube at bedtime.    [provider]  metFORMIN (GLUCOPHAGE) 1000 MG tablet Place 1 tablet (1,000 mg total) into feeding tube 2 (two) times daily with a meal. 04/23/20   Marguerita Merles  Latif, DO  MIRALAX 17 GM/SCOOP powder Place 17 g into feeding tube daily as needed for moderate constipation. 08/16/22   Meredeth Ide, MD  montelukast (SINGULAIR) 10 MG tablet Place 1 tablet (10 mg total) into feeding tube at bedtime. 04/23/20   Marguerita Merles Latif, DO  Multiple Vitamin (MULTIVITAMIN WITH MINERALS) TABS tablet Place 1 tablet into feeding tube daily. 04/23/20   Marguerita Merles Latif, DO  Nutritional Supplements (FEEDING SUPPLEMENT, JEVITY 1.2 CAL,) LIQD 237 mLs See admin instructions. 237 ml's every four hours and flush with 30 ml's of water  before and after each administration    [provider]  Quercetin 250 MG TABS Give 250 mg by tube in the morning and at bedtime.    [provider]  thiamine 100 MG tablet Place 1 tablet (100 mg total) into feeding tube daily. 04/23/20   Marguerita Merles Latif, DO  traMADol (ULTRAM) 50 MG tablet Place 1 tablet (50 mg total) into feeding tube every 6 (six) hours as needed (for pain). 08/16/22   Meredeth Ide, MD  Water For Irrigation, Sterile (FREE WATER) SOLN Place 200 mLs into feeding tube every 8 (eight) hours. 04/23/20   Marguerita Merles Latif, DO      Allergies    Chlorhexidine    Review of Systems   Review of Systems  Gastrointestinal:  Positive for vomiting.  All other systems reviewed and are negative.   Physical Exam Updated Vital Signs BP (!) 153/94   Pulse 85   Temp 97.8 F (36.6 C) (Oral)   Resp 16   SpO2 98%  Physical Exam Vitals and nursing note reviewed.  Constitutional:      General: She is not in acute distress.    Appearance: Normal appearance.  HENT:     Head: Normocephalic and atraumatic.  Eyes:     General:        Right eye: No discharge.        Left eye: No discharge.  Cardiovascular:     Comments: Regular rate and rhythm.  S1/S2 are distinct without any evidence of murmur, rubs, or gallops.  Radial pulses are 2+ bilaterally.  Dorsalis pedis pulses are 2+ bilaterally.  No evidence of pedal edema. Pulmonary:     Comments: Clear to auscultation bilaterally.  Normal effort.  No respiratory distress.  No evidence of wheezes, rales, or rhonchi heard throughout. Abdominal:     General: Abdomen is flat. Bowel sounds are normal. There is no distension.     Tenderness: There is abdominal tenderness in the right lower quadrant. There is no guarding or rebound.  Musculoskeletal:        General: Normal range of motion.     Cervical back: Neck supple.  Skin:    General: Skin is warm and dry.     Findings: No rash.  Neurological:     General: No  focal deficit present.     Mental Status: She is alert.  Psychiatric:        Mood and Affect: Mood normal.        Behavior: Behavior normal.     ED Results / Procedures / Treatments   Labs (all labs ordered are listed, but only abnormal results are displayed) Labs Reviewed  COMPREHENSIVE METABOLIC PANEL - Abnormal; Notable for the following components:      Result Value   Chloride 97 (*)    BUN 24 (*)    All other components within normal limits  CBC WITH DIFFERENTIAL/PLATELET -  Abnormal; Notable for the following components:   WBC 11.8 (*)    All other components within normal limits  LIPASE, BLOOD  CBG MONITORING, ED  TROPONIN I (HIGH SENSITIVITY)  TROPONIN I (HIGH SENSITIVITY)    EKG EKG Interpretation Date/Time:  Thursday May 17 2023 13:36:44 EDT Ventricular Rate:  89 PR Interval:  155 QRS Duration:  82 QT Interval:  372 QTC Calculation: 453 R Axis:   -79  Text Interpretation: Sinus rhythm Inferior infarct, old Baseline wander in lead(s) II aVF Confirmed by Estanislado Pandy [phone]) on 05/17/2023 2:55:07 PM  Radiology CT ABDOMEN PELVIS W CONTRAST  Result Date: 05/17/2023 CLINICAL DATA:  Acute generalized abdominal pain. EXAM: CT ABDOMEN AND PELVIS WITH CONTRAST TECHNIQUE: Multidetector CT imaging of the abdomen and pelvis was performed using the standard protocol following bolus administration of intravenous contrast. RADIATION DOSE REDUCTION: This exam was performed according to the departmental dose-optimization program which includes automated exposure control, adjustment of the mA and/or kV according to patient size and/or use of iterative reconstruction technique. CONTRAST:  OMNIPAQUE IOHEXOL 300 MG/ML  SOLN COMPARISON:  Jan 12, 2021. FINDINGS: Lower chest: No acute abnormality. Hepatobiliary: No focal liver abnormality is seen. No gallstones, gallbladder wall thickening, or biliary dilatation. Pancreas: Unremarkable. No pancreatic ductal dilatation or  surrounding inflammatory changes. Spleen: Normal in size without focal abnormality. Adrenals/Urinary Tract: Adrenal glands are unremarkable. Kidneys are normal, without renal calculi, focal lesion, or hydronephrosis. Bladder is unremarkable. Stomach/Bowel: Gastrostomy tube is noted in grossly good position in distal stomach. There is no evidence of bowel obstruction or inflammation. The appendix appears normal. Large amount of stool seen in the descending and sigmoid colon and rectum. Vascular/Lymphatic: Aortic atherosclerosis. No enlarged abdominal or pelvic lymph nodes. Reproductive: Calcified uterine fibroid is noted. No adnexal abnormality is noted. Other: No abdominal wall hernia or abnormality. No abdominopelvic ascites. Musculoskeletal: No acute or significant osseous findings. IMPRESSION: Gastrostomy tube is noted in grossly good position. Calcified uterine fibroid. No acute abnormality seen in the abdomen or pelvis. Aortic Atherosclerosis (ICD10-I70.0). Electronically Signed   By: Lupita Raider M.D.   On: 05/17/2023 16:11    Procedures Procedures    Medications Ordered in ED Medications  sodium chloride flush (NS) 0.9 % injection 3 mL (3 mLs Intravenous Given 05/17/23 1244)  ondansetron (ZOFRAN) injection 4 mg (4 mg Intravenous Given 05/17/23 1244)  iohexol (OMNIPAQUE) 300 MG/ML solution 100 mL (100 mLs Intravenous Contrast Given 05/17/23 1407)    ED Course/ Medical Decision Making/ A&P Clinical Course as of 05/17/23 1724  Thu May 17, 2023  1312 CBC with Differential(!) There is evidence of leukocytosis.  [CF]  1312 CBG monitoring, ED Normal.  [CF]  1714 Lipase, blood Negative. [CF]  1714 Comprehensive metabolic panel(!) Mild hypochloremia. No other electrolyte abnormalities. [CF]  1714 Troponin I (High Sensitivity) Initial and delta troponin is normal.  [CF]  1715  Grade [CF]  1715 CBG monitoring, ED Negative.  [CF]  1715 CT ABDOMEN PELVIS W CONTRAST I personally ordered and  interpreted the study and do not see any evidence of acute abdomen.  I do agree with the radiologist interpretation. [CF]  1720 On repeat evaluation, patient states that she is feeling better.  I went over all labs and imaging with her at the bedside.  Strict turn precautions were discussed.  All questions or concerns addressed.  She is safe for discharge. [CF]    Clinical Course User Index [CF] Teressa Lower, PA-C   {  Click here for ABCD2, HEART and other calculators  Medical Decision Making Kanijah Varughese is a 62 y.o. female patient who presents to the emergency apartment today for further evaluation of lower quadrant abdominal pain and nausea and vomiting.  Will likely plan to get a CT scan to further assess for acute abdomen including but not limited to appendicitis, kidney stone, pyelonephritis, colitis.  Will also get abdominal pain labs as well.  No evidence of acute abdomen on imaging today.  This is likely gastroenteritis.  This could be viral in nature.  Patient feeling better here in the emergency department.  Strict turn precautions were discussed.  She is safe for discharge at this time.  Amount and/or Complexity of Data Reviewed Labs: ordered. Decision-making details documented in ED Course. Radiology: ordered. Decision-making details documented in ED Course.  Risk Prescription drug management.    Final Clinical Impression(s) / ED Diagnoses Final diagnoses:  Right lower quadrant abdominal pain  Nausea and vomiting, unspecified vomiting type    Rx / DC Orders ED Discharge Orders     None         Teressa Lower, PA-C 05/17/23 1724    Coral Spikes, DO 05/22/23 [phone]

## 2023-05-17 NOTE — Discharge Instructions (Signed)
All of your labs and scans appear to be normal today.  Your white blood cell count was a little bit elevated but this is likely due to the vomiting that you have been having.  I would like for you to follow-up with your primary care doctor for further evaluation.  You may return to the emergency department for any worsening symptoms.

## 2023-05-17 NOTE — ED Triage Notes (Signed)
Pt BIB [facility] from [facility] c/o N/V and RLQ tenderness on palpation. Feeding tube in place. Sent over for evaluation.   Endorses pain in all quadrants during triage. Hypertensive on arrival.   Pt has hx of stroke, very little speech.

## 2024-01-11 ENCOUNTER — Emergency Department (HOSPITAL_COMMUNITY)

## 2024-01-11 ENCOUNTER — Emergency Department (HOSPITAL_COMMUNITY)
Admission: EM | Admit: 2024-01-11 | Discharge: 2024-01-11 | Disposition: A | Discharge status: Home / Self Care | Attending: Emergency Medicine | Admitting: Emergency Medicine

## 2024-01-11 ENCOUNTER — Other Ambulatory Visit: Payer: Self-pay

## 2024-01-11 ENCOUNTER — Encounter (HOSPITAL_COMMUNITY): Payer: Self-pay

## 2024-01-11 DIAGNOSIS — Z8616 Personal history of COVID-19: Secondary | ICD-10-CM | POA: Diagnosis not present

## 2024-01-11 DIAGNOSIS — E1122 Type 2 diabetes mellitus with diabetic chronic kidney disease: Secondary | ICD-10-CM | POA: Diagnosis not present

## 2024-01-11 DIAGNOSIS — Z794 Long term (current) use of insulin: Secondary | ICD-10-CM | POA: Diagnosis not present

## 2024-01-11 DIAGNOSIS — N189 Chronic kidney disease, unspecified: Secondary | ICD-10-CM | POA: Insufficient documentation

## 2024-01-11 DIAGNOSIS — J45901 Unspecified asthma with (acute) exacerbation: Secondary | ICD-10-CM | POA: Diagnosis not present

## 2024-01-11 DIAGNOSIS — Z79899 Other long term (current) drug therapy: Secondary | ICD-10-CM | POA: Diagnosis not present

## 2024-01-11 DIAGNOSIS — I129 Hypertensive chronic kidney disease with stage 1 through stage 4 chronic kidney disease, or unspecified chronic kidney disease: Secondary | ICD-10-CM | POA: Insufficient documentation

## 2024-01-11 DIAGNOSIS — Z7982 Long term (current) use of aspirin: Secondary | ICD-10-CM | POA: Insufficient documentation

## 2024-01-11 DIAGNOSIS — Z7951 Long term (current) use of inhaled steroids: Secondary | ICD-10-CM | POA: Diagnosis not present

## 2024-01-11 DIAGNOSIS — K9423 Gastrostomy malfunction: Secondary | ICD-10-CM | POA: Insufficient documentation

## 2024-01-11 DIAGNOSIS — Z7984 Long term (current) use of oral hypoglycemic drugs: Secondary | ICD-10-CM | POA: Diagnosis not present

## 2024-01-11 NOTE — ED Notes (Addendum)
 [facility] here to pick up patient to transport back to [facility].  Attempted report x3 unsuccessful, EMS made aware.

## 2024-01-11 NOTE — ED Notes (Signed)
 X-ray at bedside

## 2024-01-11 NOTE — ED Triage Notes (Addendum)
 [facility] from [facility]. Cc of PEG tube fell out last night unsure when.  66f tube  Nonverbal at baseline

## 2024-01-11 NOTE — ED Notes (Signed)
 Attempted to give report to [facility], no answer at this time will try again in a few.

## 2024-01-11 NOTE — Discharge Instructions (Signed)
 It was a pleasure caring for you today in the emergency department.  Please return to the emergency department for any worsening or worrisome symptoms.

## 2024-01-11 NOTE — ED Provider Notes (Signed)
 [place] EMERGENCY DEPARTMENT AT [facility] Provider Note  CSN: [id number] Arrival date & time: 01/11/24 6962  Chief Complaint(s) PEG Tube Pulled Out  HPI Tamara [name] is a 63 y.o. female with past medical history as below, significant for cognitive deficit, nonverbal, schizoaffective disorder, PEG tube who presents to the ED with complaint of PEG tube displacement  Patient coming from SNF, reported her PEG tube came out sometime overnight.  Unsure upon exact timing.  Patient able to contribute history, nonverbal  Past Medical History Past Medical History:  Diagnosis Date   Asthma    Cognitive communication deficit    COVID-19    Diabetes mellitus without complication (HCC)    Dysphagia, oropharyngeal phase    Hypertension    Major depressive disorder with single episode    Memory changes    Metabolic encephalopathy    Muscle weakness (generalized)    Other abnormalities of gait and mobility    Renal disorder    Schizoaffective disorder (HCC)    Seizures (HCC)    Unspecified sequelae of unspecified cerebrovascular disease    Unspecified severe protein-calorie malnutrition (HCC)    Unsteadiness on feet    Patient Active Problem List   Diagnosis Date Noted   PEG (percutaneous endoscopic gastrostomy) adjustment/replacement/removal (HCC) 08/13/2022   Schizoaffective disorder (HCC)    Cognitive communication deficit    Intertrochanteric fracture of right femur, closed, initial encounter (HCC) 10/28/2020   Normocytic anemia 10/28/2020   Moderate protein malnutrition (HCC) 10/28/2020   Protein-calorie malnutrition, severe 04/05/2020   AKI (acute kidney injury) (HCC) 04/02/2020   Acute metabolic encephalopathy 04/02/2020   Acute encephalopathy 03/01/2020   Asthma 03/01/2020   Dehydration 03/01/2020   Acute kidney injury superimposed on CKD (HCC) 03/01/2020   Hyperammonemia (HCC) 03/01/2020   Elevated serum hCG 03/01/2020   Hypermagnesemia 03/01/2020   Fall  03/01/2020   Vomiting 03/01/2020   Essential hypertension 03/01/2020   AMS (altered mental status) 03/01/2020   Chest pain 11/03/2018   Asthma with acute exacerbation 01/14/2013   Type 2 diabetes mellitus (HCC) 01/14/2013   Dyslipidemia 01/14/2013   Home Medication(s) Prior to Admission medications   Medication Sig Start Date End Date Taking? Authorizing Provider  Acetaminophen  500 MG capsule Place 2 capsules (1,000 mg total) into feeding tube 3 (three) times daily as needed for pain. 04/23/20   Aura Leeds Latif, DO  albuterol  (PROVENTIL  HFA;VENTOLIN  HFA) 108 (90 BASE) MCG/ACT inhaler Inhale 2 puffs into the lungs every 6 (six) hours as needed for wheezing. 10/04/12   Johnson, Clanford L, MD  aspirin  (ASPIRIN  CHILDRENS) 81 MG chewable tablet Place 1 tablet (81 mg total) into feeding tube daily. 04/23/20   Aura Leeds Latif, DO  atorvastatin  (LIPITOR) 40 MG tablet Place 40 mg into feeding tube at bedtime.    [provider]  buPROPion  (WELLBUTRIN ) 75 MG tablet Take 75 mg by mouth See admin instructions. Take 2 tablets (150 mg) via NJ tube at bedtime    [provider]  buPROPion  ER (WELLBUTRIN  SR) 100 MG 12 hr tablet 100 mg See admin instructions. 100 mg, per tube, once a day    [provider]  Cholecalciferol  (VITAMIN D3) 50 MCG (2000 UT) TABS Place 2,000 Units into feeding tube daily.    [provider]  docusate sodium  (COLACE) 100 MG capsule Take 1 capsule (100 mg total) by mouth 2 (two) times daily. Patient taking differently: 100 mg See admin instructions. 100 mg, per tube, two times a day 11/02/20  Johnson, Clanford L, MD  Fluticasone -Salmeterol (ADVAIR) 250-50 MCG/DOSE AEPB Inhale 1 puff into the lungs in the morning and at bedtime.    [provider]  folic acid  (FOLVITE ) 1 MG tablet Place 1 tablet (1 mg total) into feeding tube daily. 04/23/20   Sheikh, Omair Latif, DO  insulin  aspart (NOVOLOG ) 100 UNIT/ML injection Inject 0-9 Units into  the skin 3 (three) times daily with meals. Sliding scale insulin  Less than 70 initiate hypoglycemia protocol 70-120  0 units 120-150 1 unit 151-200 2 units 201-250 3 units 251-300 5 units 301-350 7 units 351-400 9 units  Greater than 400 call MD 08/16/22   Ozell Blunt, MD  levETIRAcetam  (KEPPRA ) 100 MG/ML solution Place 5 mLs (500 mg total) into feeding tube 2 (two) times daily. 04/23/20   Aura Leeds Latif, DO  losartan  (COZAAR ) 100 MG tablet Place 1 tablet (100 mg total) into feeding tube daily. 04/23/20   Sheikh, Omair Latif, DO  magnesium  30 MG tablet Place 1 tablet (30 mg total) into feeding tube daily. 04/23/20   Sheikh, Omair Latif, DO  melatonin 5 MG TABS Place 5 mg into feeding tube at bedtime.    [provider]  metFORMIN  (GLUCOPHAGE ) 1000 MG tablet Place 1 tablet (1,000 mg total) into feeding tube 2 (two) times daily with a meal. 04/23/20   Sheikh, Omair Latif, DO  MIRALAX  17 GM/SCOOP powder Place 17 g into feeding tube daily as needed for moderate constipation. 08/16/22   Ozell Blunt, MD  montelukast  (SINGULAIR ) 10 MG tablet Place 1 tablet (10 mg total) into feeding tube at bedtime. 04/23/20   Sheikh, Omair Latif, DO  Multiple Vitamin (MULTIVITAMIN WITH MINERALS) TABS tablet Place 1 tablet into feeding tube daily. 04/23/20   Aura Leeds Latif, DO  Nutritional Supplements (FEEDING SUPPLEMENT, JEVITY 1.2 CAL,) LIQD 237 mLs See admin instructions. 237 ml's every four hours and flush with 30 ml's of water  before and after each administration    [provider]  Quercetin  250 MG TABS Give 250 mg by tube in the morning and at bedtime.    [provider]  thiamine  100 MG tablet Place 1 tablet (100 mg total) into feeding tube daily. 04/23/20   Aura Leeds Latif, DO  traMADol  (ULTRAM ) 50 MG tablet Place 1 tablet (50 mg total) into feeding tube every 6 (six) hours as needed (for pain). 08/16/22   Ozell Blunt, MD  Water  For Irrigation, Sterile (FREE WATER ) SOLN  Place 200 mLs into feeding tube every 8 (eight) hours. 04/23/20   Eveline Hipps, DO                                                                                                                                    Past Surgical History Past Surgical History:  Procedure Laterality Date   INTRAMEDULLARY (IM) NAIL INTERTROCHANTERIC Right 10/29/2020   Procedure: INTRAMEDULLARY (IM) NAIL INTERTROCHANTRIC;  Surgeon:  Janeth Medicus, MD;  Location[facility];  Service: Orthopedics;  Laterality: Right;   IR GASTROSTOMY TUBE MOD SED  04/19/2020   IR REPLC GASTRO/COLONIC TUBE PERCUT W/FLUORO  08/14/2022   IR REPLC GASTRO/COLONIC TUBE PERCUT W/FLUORO  01/04/2023   Family History Family History  Problem Relation Age of Onset   Hypertension Mother    Hypertension Father     Social History Social History   Tobacco Use   Smoking status: Never   Smokeless tobacco: Never  Substance Use Topics   Alcohol use: Not Currently   Drug use: Not Currently   Allergies Chlorhexidine  Review of Systems A thorough review of systems was obtained and all systems are negative except as noted in the HPI and PMH.   Physical Exam Vital Signs  I have reviewed the triage vital signs BP 103/70   Pulse 78   Temp 97.8 F (36.6 C) (Axillary)   Resp 18   Ht 5\' 7"  (1.702 m)   Wt 58.3 kg   SpO2 100%   BMI 20.13 kg/m  Physical Exam Vitals and nursing note reviewed.  Constitutional:      General: She is not in acute distress.    Appearance: She is well-developed. She is obese.  HENT:     Head: Normocephalic and atraumatic.     Right Ear: External ear normal.     Left Ear: External ear normal.     Nose: Nose normal.     Mouth/Throat:     Mouth: Mucous membranes are moist.  Eyes:     General: No scleral icterus.       Right eye: No discharge.        Left eye: No discharge.  Cardiovascular:     Rate and Rhythm: Normal rate.  Pulmonary:     Effort: Pulmonary effort is normal. No respiratory  distress.     Breath sounds: No stridor.  Abdominal:     General: Abdomen is flat. There is no distension.     Palpations: Abdomen is soft.     Tenderness: There is no abdominal tenderness. There is no guarding.    Musculoskeletal:        General: No deformity.     Cervical back: No rigidity.  Skin:    General: Skin is warm and dry.     Coloration: Skin is not cyanotic, jaundiced or pale.  Neurological:     Mental Status: She is alert and oriented to person, place, and time.     GCS: GCS eye subscore is 4. GCS verbal subscore is 5. GCS motor subscore is 6.  Psychiatric:        Speech: Speech normal.        Behavior: Behavior normal. Behavior is cooperative.     ED Results and Treatments Labs (all labs ordered are listed, but only abnormal results are displayed) Labs Reviewed - No data to display  Radiology DG ABDOMEN PEG TUBE LOCATION Result Date: 01/11/2024 CLINICAL DATA:  Percutaneous gastrostomy tube placement. EXAM: ABDOMEN - 2 VIEW COMPARISON:  None Available. FINDINGS: The bowel gas pattern is normal. Gastrostomy tube is noted in the distal gastric antrum, with injected contrast seen throughout the duodenum and proximal jejunum. No evidence of contrast leak or extravasation. IMPRESSION: Gastrostomy tube in the distal gastric antrum, with injected contrast opacifying the proximal small bowel. Electronically Signed   By: Marlyce Sine M.D.   On: 01/11/2024 09:19    Pertinent labs & imaging results that were available during my care of the patient were reviewed by me and considered in my medical decision making (see MDM for details).  Medications Ordered in ED Medications - No data to display                                                                                                                                   Procedures .Gastrostomy tube  replacement  Date/Time: 01/11/2024 8:25 AM  Performed by: Teddi Favors, DO Authorized by: Teddi Favors, DO  Risks and benefits: risks, benefits and alternatives were discussed Required items: required blood products, implants, devices, and special equipment available Patient identity confirmed: arm band and hospital-assigned identification number Time out: Immediately prior to procedure a "time out" was called to verify the correct patient, procedure, equipment, support staff and site/side marked as required. Preparation: Patient was prepped and draped in the usual sterile fashion. Local anesthesia used: no  Anesthesia: Local anesthesia used: no  Sedation: Patient sedated: no  Patient tolerance: patient tolerated the procedure well with no immediate complications Comments: 15fr     (including critical care time)  Medical Decision Making / ED Course    Medical Decision Making:    Shavonn Geisel is a 63 y.o. female with past medical history as below, significant for cognitive deficit, nonverbal, schizoaffective disorder, PEG tube who presents to the ED with complaint of PEG tube displacement. The complaint involves an extensive differential diagnosis and also carries with it a high risk of complications and morbidity.  Serious etiology was considered. Ddx includes but is not limited to: PEG tube dislodgment, abdominal trauma to stoma, stoma closure, etc.  Complete initial physical exam performed, notably the patient was in no distress, resting on stretcher.    Reviewed and confirmed nursing documentation for past medical history, family history, social history.  Vital signs reviewed.     Brief summary:   63 year old female history above including nonverbal, PEG tube.  Presenting here after PEG tube became dislodged.  Sometime in the last 12 hours. PEG tube was replaced at bedside by myself, 27fr Confirmatory x-ray ordered      XR shows peg tube in appropriate  position  Pt well appearing, does not appear to be in any pain. PEG tube replaced, stable for dc back to SNF.  Additional history obtained: -Additional history obtained from na -External records from outside source obtained and reviewed including: Chart review including previous notes, labs, imaging, consultation notes including  Prior er visits Prior imaging   Lab Tests: na  EKG   EKG Interpretation Date/Time:    Ventricular Rate:    PR Interval:    QRS Duration:    QT Interval:    QTC Calculation:   R Axis:      Text Interpretation:           Imaging Studies ordered: I ordered imaging studies including xr abdomen I independently visualized the following imaging with scope of interpretation limited to determining acute life threatening conditions related to emergency care; findings noted above I agree with the radiologist interpretation If any imaging was obtained with contrast I closely monitored patient for any possible adverse reaction a/w contrast administration in the emergency department   Medicines ordered and prescription drug management: No orders of the defined types were placed in this encounter.   -I have reviewed the patients home medicines and have made adjustments as needed   Consultations Obtained: na   Cardiac Monitoring: Continuous pulse oximetry interpreted by myself, 100% on RA.    Social Determinants of Health:  Diagnosis or treatment significantly limited by social determinants of health: obesity   Reevaluation: After the interventions noted above, I reevaluated the patient and found that they have improved  Co morbidities that complicate the patient evaluation  Past Medical History:  Diagnosis Date   Asthma    Cognitive communication deficit    COVID-19    Diabetes mellitus without complication (HCC)    Dysphagia, oropharyngeal phase    Hypertension    Major depressive disorder with single episode     Memory changes    Metabolic encephalopathy    Muscle weakness (generalized)    Other abnormalities of gait and mobility    Renal disorder    Schizoaffective disorder (HCC)    Seizures (HCC)    Unspecified sequelae of unspecified cerebrovascular disease    Unspecified severe protein-calorie malnutrition (HCC)    Unsteadiness on feet       Dispostion: Disposition decision including need for hospitalization was considered, and patient discharged from emergency department.    Final Clinical Impression(s) / ED Diagnoses Final diagnoses:  PEG tube malfunction (HCC)        Teddi Favors, DO 01/11/24 [phone]

## 2024-09-20 ENCOUNTER — Encounter (HOSPITAL_COMMUNITY): Payer: Self-pay | Admitting: Emergency Medicine

## 2024-09-20 ENCOUNTER — Other Ambulatory Visit: Payer: Self-pay

## 2024-09-20 ENCOUNTER — Emergency Department (HOSPITAL_COMMUNITY)
Admission: EM | Admit: 2024-09-20 | Discharge: 2024-09-21 | Disposition: A | Source: Skilled Nursing Facility | Attending: Emergency Medicine | Admitting: Emergency Medicine

## 2024-09-20 DIAGNOSIS — T85528A Displacement of other gastrointestinal prosthetic devices, implants and grafts, initial encounter: Secondary | ICD-10-CM | POA: Insufficient documentation

## 2024-09-20 DIAGNOSIS — Y738 Miscellaneous gastroenterology and urology devices associated with adverse incidents, not elsewhere classified: Secondary | ICD-10-CM | POA: Diagnosis not present

## 2024-09-20 NOTE — ED Triage Notes (Signed)
 Pt bib ems from [facility] with c/o g-tube displacement.  84HR 98RA 133/85 [id number]

## 2024-09-20 NOTE — ED Provider Notes (Signed)
 "  [place] EMERGENCY DEPARTMENT AT [facility]  Provider Note  CSN: [id number] Arrival date & time: 09/20/24 2349  History No chief complaint on file.   Tamara [name] is a 64 y.o. female brought to ED via EMS from SNF after G-tube came out an unknown time ago. No further history is available, patient is non verbal.    Home Medications Prior to Admission medications  Medication Sig Start Date End Date Taking? Authorizing Provider  Acetaminophen  500 MG capsule Place 2 capsules (1,000 mg total) into feeding tube 3 (three) times daily as needed for pain. 04/23/20   Sherrill Cable Latif, DO  albuterol  (PROVENTIL  HFA;VENTOLIN  HFA) 108 (90 BASE) MCG/ACT inhaler Inhale 2 puffs into the lungs every 6 (six) hours as needed for wheezing. 10/04/12   Johnson, Clanford L, MD  aspirin  (ASPIRIN  CHILDRENS) 81 MG chewable tablet Place 1 tablet (81 mg total) into feeding tube daily. 04/23/20   Sherrill Cable Latif, DO  atorvastatin  (LIPITOR) 40 MG tablet Place 40 mg into feeding tube at bedtime.    [provider]  buPROPion  (WELLBUTRIN ) 75 MG tablet Take 75 mg by mouth See admin instructions. Take 2 tablets (150 mg) via NJ tube at bedtime    [provider]  buPROPion  ER (WELLBUTRIN  SR) 100 MG 12 hr tablet 100 mg See admin instructions. 100 mg, per tube, once a day    [provider]  Cholecalciferol  (VITAMIN D3) 50 MCG (2000 UT) TABS Place 2,000 Units into feeding tube daily.    [provider]  docusate sodium  (COLACE) 100 MG capsule Take 1 capsule (100 mg total) by mouth 2 (two) times daily. Patient taking differently: 100 mg See admin instructions. 100 mg, per tube, two times a day 11/02/20   Vicci Pen L, MD  Fluticasone -Salmeterol (ADVAIR) 250-50 MCG/DOSE AEPB Inhale 1 puff into the lungs in the morning and at bedtime.    [provider]  folic acid  (FOLVITE ) 1 MG tablet Place 1 tablet (1 mg total) into feeding tube daily. 04/23/20   Sheikh, Omair  Latif, DO  insulin  aspart (NOVOLOG ) 100 UNIT/ML injection Inject 0-9 Units into the skin 3 (three) times daily with meals. Sliding scale insulin  Less than 70 initiate hypoglycemia protocol 70-120  0 units 120-150 1 unit 151-200 2 units 201-250 3 units 251-300 5 units 301-350 7 units 351-400 9 units  Greater than 400 call MD 08/16/22   Drusilla Sabas RAMAN, MD  levETIRAcetam  (KEPPRA ) 100 MG/ML solution Place 5 mLs (500 mg total) into feeding tube 2 (two) times daily. 04/23/20   Sherrill Cable Latif, DO  losartan  (COZAAR ) 100 MG tablet Place 1 tablet (100 mg total) into feeding tube daily. 04/23/20   Sheikh, Omair Latif, DO  magnesium  30 MG tablet Place 1 tablet (30 mg total) into feeding tube daily. 04/23/20   Sheikh, Omair Latif, DO  melatonin 5 MG TABS Place 5 mg into feeding tube at bedtime.    [provider]  metFORMIN  (GLUCOPHAGE ) 1000 MG tablet Place 1 tablet (1,000 mg total) into feeding tube 2 (two) times daily with a meal. 04/23/20   Sheikh, Omair Latif, DO  MIRALAX  17 GM/SCOOP powder Place 17 g into feeding tube daily as needed for moderate constipation. 08/16/22   Drusilla Sabas RAMAN, MD  montelukast  (SINGULAIR ) 10 MG tablet Place 1 tablet (10 mg total) into feeding tube at bedtime. 04/23/20   Sheikh, Omair Latif, DO  Multiple Vitamin (MULTIVITAMIN WITH MINERALS) TABS tablet Place 1 tablet into feeding  tube daily. 04/23/20   Sherrill Cable Latif, DO  Nutritional Supplements (FEEDING SUPPLEMENT, JEVITY 1.2 CAL,) LIQD 237 mLs See admin instructions. 237 ml's every four hours and flush with 30 ml's of water  before and after each administration    [provider]  Quercetin  250 MG TABS Give 250 mg by tube in the morning and at bedtime.    [provider]  thiamine  100 MG tablet Place 1 tablet (100 mg total) into feeding tube daily. 04/23/20   Sherrill Cable Latif, DO  traMADol  (ULTRAM ) 50 MG tablet Place 1 tablet (50 mg total) into feeding tube every 6 (six) hours as needed (for  pain). 08/16/22   Drusilla Sabas RAMAN, MD  Water  For Irrigation, Sterile (FREE WATER ) SOLN Place 200 mLs into feeding tube every 8 (eight) hours. 04/23/20   Sherrill Cable Latif, DO     Allergies    Chlorhexidine   Review of Systems   Review of Systems Please see HPI for pertinent positives and negatives  Physical Exam There were no vitals taken for this visit.  Physical Exam Vitals and nursing note reviewed.  HENT:     Head: Normocephalic.     Nose: Nose normal.  Eyes:     Extraocular Movements: Extraocular movements intact.  Pulmonary:     Effort: Pulmonary effort is normal.  Abdominal:     General: There is no distension.     Comments: Stoma in epigastrium  Musculoskeletal:        General: Normal range of motion.     Cervical back: Neck supple.  Skin:    Findings: No rash (on exposed skin).  Neurological:     Mental Status: She is alert and oriented to person, place, and time.  Psychiatric:        Mood and Affect: Mood normal.     ED Results / Procedures / Treatments   EKG None  Procedures Procedures  Medications Ordered in the ED Medications - No data to display  Initial Impression and Plan  ***  ED Course       MDM Rules/Calculators/A&P Medical Decision Making    Final Clinical Impression(s) / ED Diagnoses Final diagnoses:  None    Rx / DC Orders ED Discharge Orders     None      "

## 2024-09-21 ENCOUNTER — Emergency Department (HOSPITAL_COMMUNITY)

## 2024-09-21 NOTE — ED Notes (Signed)
 New G-tube installed via MD.

## 2024-09-21 NOTE — ED Notes (Signed)
 EMS transport called for patient return.

## 2024-09-22 ENCOUNTER — Other Ambulatory Visit: Payer: Self-pay

## 2024-09-22 ENCOUNTER — Emergency Department (HOSPITAL_COMMUNITY)

## 2024-09-22 ENCOUNTER — Emergency Department (HOSPITAL_COMMUNITY)
Admission: EM | Admit: 2024-09-22 | Discharge: 2024-09-22 | Disposition: A | Discharge status: Home / Self Care | Attending: Emergency Medicine | Admitting: Emergency Medicine

## 2024-09-22 ENCOUNTER — Encounter (HOSPITAL_COMMUNITY): Payer: Self-pay | Admitting: *Deleted

## 2024-09-22 DIAGNOSIS — Z794 Long term (current) use of insulin: Secondary | ICD-10-CM | POA: Diagnosis not present

## 2024-09-22 DIAGNOSIS — K9423 Gastrostomy malfunction: Secondary | ICD-10-CM | POA: Diagnosis present

## 2024-09-22 DIAGNOSIS — Z7984 Long term (current) use of oral hypoglycemic drugs: Secondary | ICD-10-CM | POA: Insufficient documentation

## 2024-09-22 DIAGNOSIS — Z7982 Long term (current) use of aspirin: Secondary | ICD-10-CM | POA: Insufficient documentation

## 2024-09-22 DIAGNOSIS — Z79899 Other long term (current) drug therapy: Secondary | ICD-10-CM | POA: Insufficient documentation

## 2024-09-22 DIAGNOSIS — T85528A Displacement of other gastrointestinal prosthetic devices, implants and grafts, initial encounter: Secondary | ICD-10-CM

## 2024-09-22 MED ORDER — DIATRIZOATE MEGLUMINE & SODIUM 66-10 % PO SOLN
30.0000 mL | Freq: Once | ORAL | Status: AC
  Administered 2024-09-22: 30 mL

## 2024-09-22 NOTE — ED Provider Notes (Signed)
 "  EMERGENCY DEPARTMENT AT [facility] Provider Note   CSN: [id number] Arrival date & time: 09/22/24  1244     Patient presents with: G Tube pulled out   Tamara [name] is a 64 y.o. female.   HPI Patient presents from nursing home.  Reportedly pulled out G-tube.  Looked at patient's abdomen and G-tube out of place.  Patient is nonverbal at baseline.    Prior to Admission medications  Medication Sig Start Date End Date Taking? Authorizing Provider  Acetaminophen  500 MG capsule Place 2 capsules (1,000 mg total) into feeding tube 3 (three) times daily as needed for pain. 04/23/20   Sheikh, Omair Latif, DO  albuterol  (PROVENTIL  HFA;VENTOLIN  HFA) 108 (90 BASE) MCG/ACT inhaler Inhale 2 puffs into the lungs every 6 (six) hours as needed for wheezing. 10/04/12   Johnson, Clanford L, MD  aspirin  (ASPIRIN  CHILDRENS) 81 MG chewable tablet Place 1 tablet (81 mg total) into feeding tube daily. 04/23/20   Sherrill Cable Latif, DO  atorvastatin  (LIPITOR) 40 MG tablet Place 40 mg into feeding tube at bedtime.    [provider]  buPROPion  (WELLBUTRIN ) 75 MG tablet Take 75 mg by mouth See admin instructions. Take 2 tablets (150 mg) via NJ tube at bedtime    [provider]  buPROPion  ER (WELLBUTRIN  SR) 100 MG 12 hr tablet 100 mg See admin instructions. 100 mg, per tube, once a day    [provider]  Cholecalciferol  (VITAMIN D3) 50 MCG (2000 UT) TABS Place 2,000 Units into feeding tube daily.    [provider]  docusate sodium  (COLACE) 100 MG capsule Take 1 capsule (100 mg total) by mouth 2 (two) times daily. Patient taking differently: 100 mg See admin instructions. 100 mg, per tube, two times a day 11/02/20   Vicci Pen L, MD  Fluticasone -Salmeterol (ADVAIR) 250-50 MCG/DOSE AEPB Inhale 1 puff into the lungs in the morning and at bedtime.    [provider]  folic acid  (FOLVITE ) 1 MG tablet Place 1 tablet (1 mg total) into feeding tube  daily. 04/23/20   Sherrill Cable Latif, DO  insulin  aspart (NOVOLOG ) 100 UNIT/ML injection Inject 0-9 Units into the skin 3 (three) times daily with meals. Sliding scale insulin  Less than 70 initiate hypoglycemia protocol 70-120  0 units 120-150 1 unit 151-200 2 units 201-250 3 units 251-300 5 units 301-350 7 units 351-400 9 units  Greater than 400 call MD 08/16/22   Drusilla Sabas RAMAN, MD  levETIRAcetam  (KEPPRA ) 100 MG/ML solution Place 5 mLs (500 mg total) into feeding tube 2 (two) times daily. 04/23/20   Sherrill Cable Latif, DO  losartan  (COZAAR ) 100 MG tablet Place 1 tablet (100 mg total) into feeding tube daily. 04/23/20   Sheikh, Omair Latif, DO  magnesium  30 MG tablet Place 1 tablet (30 mg total) into feeding tube daily. 04/23/20   Sheikh, Omair Latif, DO  melatonin 5 MG TABS Place 5 mg into feeding tube at bedtime.    [provider]  metFORMIN  (GLUCOPHAGE ) 1000 MG tablet Place 1 tablet (1,000 mg total) into feeding tube 2 (two) times daily with a meal. 04/23/20   Sheikh, Omair Latif, DO  MIRALAX  17 GM/SCOOP powder Place 17 g into feeding tube daily as needed for moderate constipation. 08/16/22   Drusilla Sabas RAMAN, MD  montelukast  (SINGULAIR ) 10 MG tablet Place 1 tablet (10 mg total) into feeding tube at bedtime. 04/23/20   Sheikh, Omair Latif, DO  Multiple Vitamin (MULTIVITAMIN WITH MINERALS)  TABS tablet Place 1 tablet into feeding tube daily. 04/23/20   Sherrill Cable Latif, DO  Nutritional Supplements (FEEDING SUPPLEMENT, JEVITY 1.2 CAL,) LIQD 237 mLs See admin instructions. 237 ml's every four hours and flush with 30 ml's of water  before and after each administration    [provider]  Quercetin  250 MG TABS Give 250 mg by tube in the morning and at bedtime.    [provider]  thiamine  100 MG tablet Place 1 tablet (100 mg total) into feeding tube daily. 04/23/20   Sherrill Cable Latif, DO  traMADol  (ULTRAM ) 50 MG tablet Place 1 tablet (50 mg total) into feeding tube every 6  (six) hours as needed (for pain). 08/16/22   Drusilla Sabas RAMAN, MD  Water  For Irrigation, Sterile (FREE WATER ) SOLN Place 200 mLs into feeding tube every 8 (eight) hours. 04/23/20   Sherrill Cable Latif, DO    Allergies: Chlorhexidine    Review of Systems  Updated Vital Signs BP (!) 146/92   Pulse (!) 103   Temp 98.2 F (36.8 C) (Axillary)   Resp 18   SpO2 (!) 87%   Physical Exam Vitals reviewed.  Abdominal:     Comments:  No G-tube and stoma on left upper abdomen.     (all labs ordered are listed, but only abnormal results are displayed) Labs Reviewed - No data to display  EKG: None  Radiology: DG Abdomen PEG Tube Location Result Date: 09/22/2024 CLINICAL DATA:  400561 PEG tube malfunction (HCC) 400561 EXAM: ABDOMEN - 1 VIEW COMPARISON:  [facility], 09/21/2024 and 01/11/2024.  CT chest, 05/17/2023. FINDINGS: Support lines; gastrostomy tube, projecting over the epigastrium with tip at the antrum and intraluminal opacification of contrast. Nonobstructed bowel-gas. Contrast opacification of colon, likely previously administered. Mild air distention of the colon. No acute osseous abnormality. IMPRESSION: Gastrostomy tube, with tip at the antrum and contrast opacification of the stomach Electronically Signed   By: Thom Hall M.D.   On: 09/22/2024 14:16   DG Abdomen PEG Tube Location Result Date: 09/21/2024 EXAM: 1 VIEW XRAY OF THE ABDOMEN 09/21/2024 12:24:31 AM COMPARISON: 01/11/2024 CLINICAL HISTORY: Gastrostomy malfunction. HCC [id number]. FINDINGS: BOWEL: Enteric contrast opacified as a decompressed gastric lumen and extends into the duodenum and proximal jejunum which demonstrated normal mucosal fold pattern and caliber. Gastrostomy retaining balloon creates a filling defect within the distal body of the stomach. No extraluminal extension of contrast. Normal abdominal gas pattern. No free intraperitoneal gas. SOFT TISSUES: No abnormal calcifications. BONES: No acute fracture. IMPRESSION: 1.  Gastrostomy retaining balloon projects within the distal gastric body without extraluminal extension of enteric contrast. 2. Decompressed gastric lumen with enteric contrast extending into the duodenum and proximal jejunum, with normal fold pattern and caliber. Electronically signed by: Dorethia Molt MD 09/21/2024 12:29 AM EST RP Workstation: [id number]     .Gastrostomy tube replacement  Date/Time: 09/22/2024 1:30 PM  Performed by: Patsey Lot, MD Authorized by: Patsey Lot, MD  Consent: Verbal consent not obtained. Written consent not obtained Required items: required blood products, implants, devices, and special equipment available Time out: Immediately prior to procedure a time out was called to verify the correct patient, procedure, equipment, support staff and site/side marked as required. Local anesthesia used: no  Anesthesia: Local anesthesia used: no  Sedation: Patient sedated: no  Patient tolerance: patient tolerated the procedure well with no immediate complications Comments: 56 French G-tube placed.  Verified with x-ray.      Medications Ordered in the ED  diatrizoate  meglumine -sodium (GASTROGRAFIN )  66-10 % solution 30 mL (30 mLs Per Tube Given 09/22/24 1407)                                    Medical Decision Making Amount and/or Complexity of Data Reviewed Radiology: ordered.  Risk Prescription drug management.   Patient with G-tube pathology.  Tube would come out.  Replaced 18 French tube.  Verified with x-ray.  Discharge home.  Appears to be at baseline.     Final diagnoses:  Dislodged gastrostomy tube    ED Discharge Orders     None          Patsey Lot, MD 09/22/24 1441  "

## 2024-09-22 NOTE — ED Triage Notes (Signed)
 Pt BIB [facility] from [facility] for c/o pt pulled g tube out
